# Patient Record
Sex: Female | Born: 1945
Health system: Southern US, Community
[De-identification: ages and names within clinical notes are randomized; demographics above are authoritative.]

## PROBLEM LIST (undated history)

## (undated) DIAGNOSIS — K219 Gastro-esophageal reflux disease without esophagitis: Secondary | ICD-10-CM

## (undated) DIAGNOSIS — G43909 Migraine, unspecified, not intractable, without status migrainosus: Secondary | ICD-10-CM

## (undated) DIAGNOSIS — I10 Essential (primary) hypertension: Secondary | ICD-10-CM

## (undated) DIAGNOSIS — E119 Type 2 diabetes mellitus without complications: Secondary | ICD-10-CM

## (undated) DIAGNOSIS — M1990 Unspecified osteoarthritis, unspecified site: Secondary | ICD-10-CM

## (undated) DIAGNOSIS — M810 Age-related osteoporosis without current pathological fracture: Secondary | ICD-10-CM

## (undated) DIAGNOSIS — I499 Cardiac arrhythmia, unspecified: Secondary | ICD-10-CM

## (undated) DIAGNOSIS — M549 Dorsalgia, unspecified: Secondary | ICD-10-CM

## (undated) DIAGNOSIS — Z8379 Family history of other diseases of the digestive system: Secondary | ICD-10-CM

## (undated) DIAGNOSIS — D179 Benign lipomatous neoplasm, unspecified: Secondary | ICD-10-CM

## (undated) DIAGNOSIS — R519 Headache, unspecified: Secondary | ICD-10-CM

## (undated) DIAGNOSIS — K3580 Unspecified acute appendicitis: Secondary | ICD-10-CM

## (undated) DIAGNOSIS — K5790 Diverticulosis of intestine, part unspecified, without perforation or abscess without bleeding: Secondary | ICD-10-CM

## (undated) DIAGNOSIS — R51 Headache: Secondary | ICD-10-CM

## (undated) HISTORY — DX: Age-related osteoporosis without current pathological fracture: M81.0

## (undated) HISTORY — DX: Family history of other diseases of the digestive system: Z83.79

## (undated) HISTORY — DX: Headache: R51

## (undated) HISTORY — DX: Diverticulosis of intestine, part unspecified, without perforation or abscess without bleeding: K57.90

## (undated) HISTORY — DX: Cardiac arrhythmia, unspecified: I49.9

## (undated) HISTORY — DX: Headache, unspecified: R51.9

## (undated) HISTORY — DX: Unspecified osteoarthritis, unspecified site: M19.90

## (undated) HISTORY — DX: Benign lipomatous neoplasm, unspecified: D17.9

## (undated) HISTORY — DX: Type 2 diabetes mellitus without complications: E11.9

## (undated) HISTORY — DX: Gastro-esophageal reflux disease without esophagitis: K21.9

## (undated) HISTORY — PX: CHOLECYSTECTOMY: SHX55

## (undated) HISTORY — DX: Essential (primary) hypertension: I10

## (undated) HISTORY — DX: Dorsalgia, unspecified: M54.9

## (undated) HISTORY — PX: TONSILLECTOMY: SUR1361

## (undated) MED FILL — Fosaprepitant Dimeglumine For IV Infusion 150 MG (Base Eq): INTRAVENOUS | Qty: 5 | Status: AC

---

## 2003-07-17 ENCOUNTER — Emergency Department (HOSPITAL_COMMUNITY): Admission: EM | Admit: 2003-07-17 | Discharge: 2003-07-17 | Payer: Self-pay | Admitting: Emergency Medicine

## 2003-07-18 ENCOUNTER — Emergency Department (HOSPITAL_COMMUNITY): Admission: EM | Admit: 2003-07-18 | Discharge: 2003-07-18 | Payer: Self-pay | Admitting: Emergency Medicine

## 2003-07-18 IMAGING — CR DG CHEST 2V
2 series · 2 of 2 positions shown · non-contrast
Comparison: none

CLINICAL DATA: Chest pain; ER pt
 CHEST TWO VIEWS 
 No priors for comparison.  Mild cardiomegaly.  Peripheral pulmonary vessels have a caliber which I would consider to be slightly prominent.  Mild chronic peribronchial changes are likely present.  No evidence of pulmonary edema or infiltrate.  Thoracic scoliosis convex left.
 IMPRESSION
 Cardiomegaly.  Question pulmonary venous hypertension.

[view not recorded (1 of 2)]
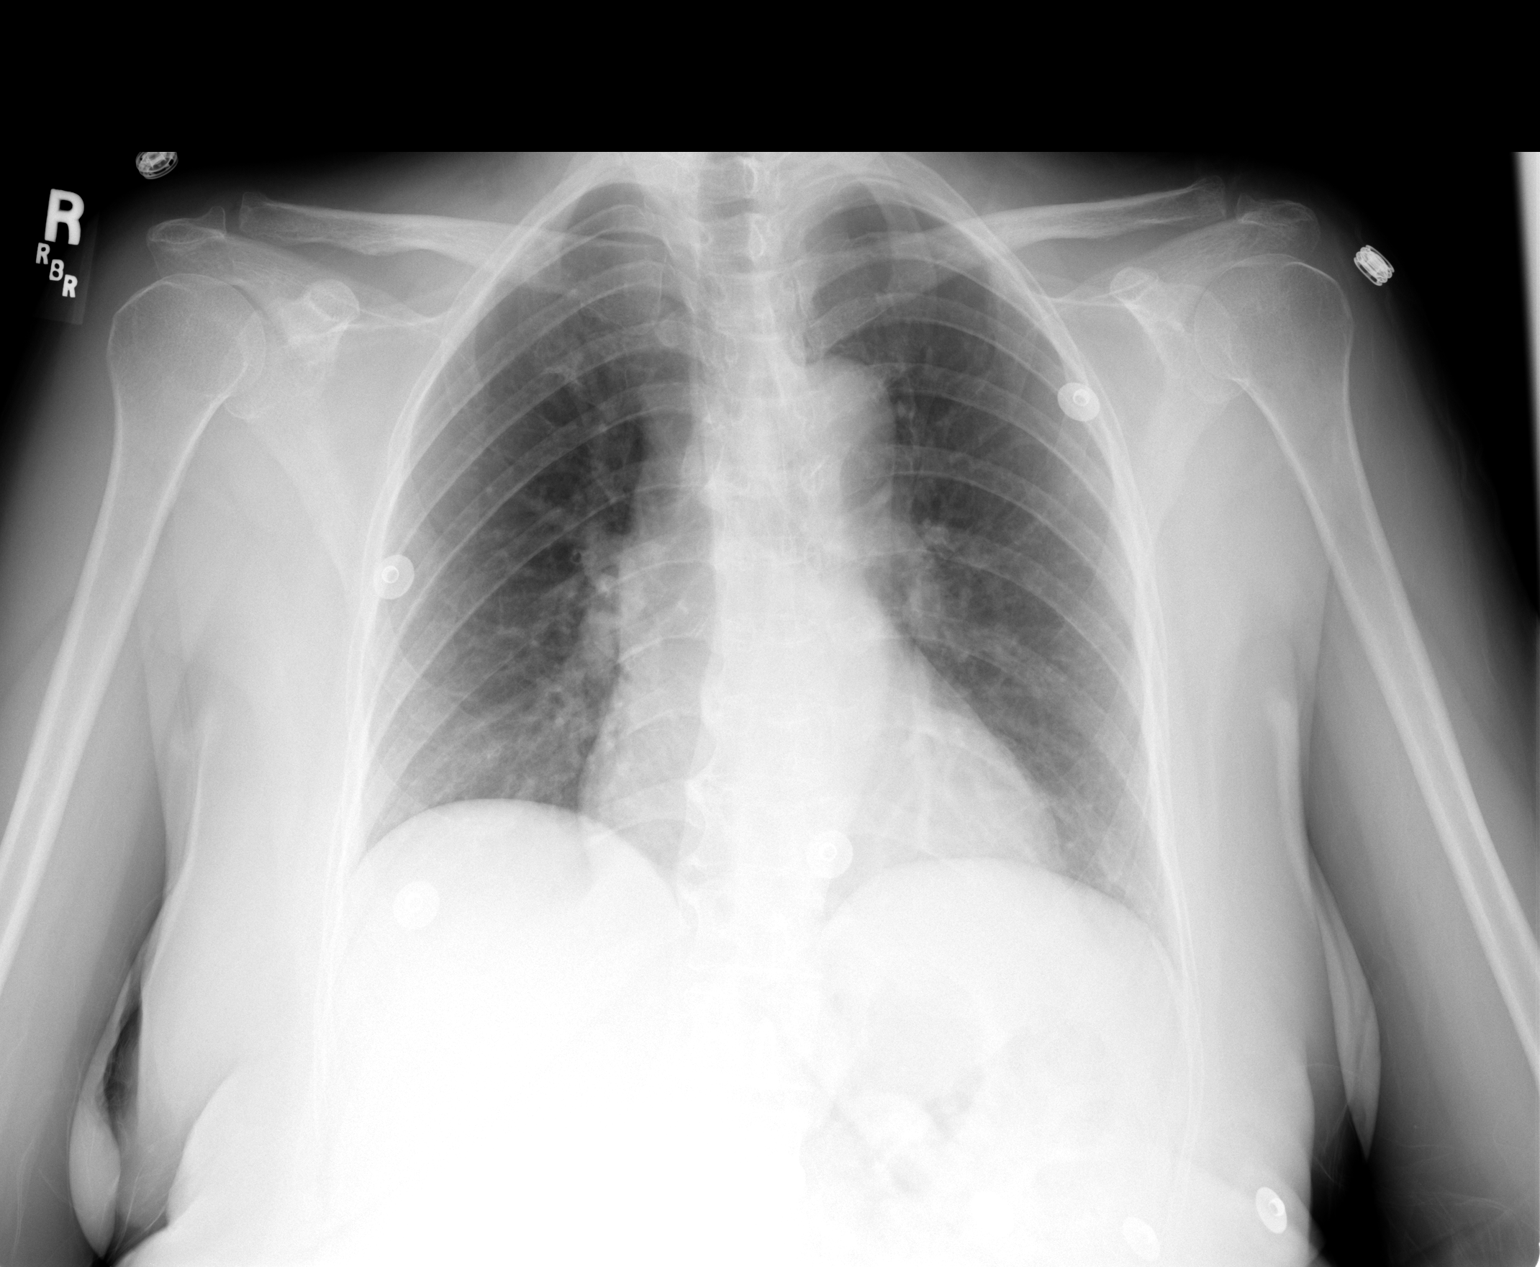

[view not recorded (2 of 2)]
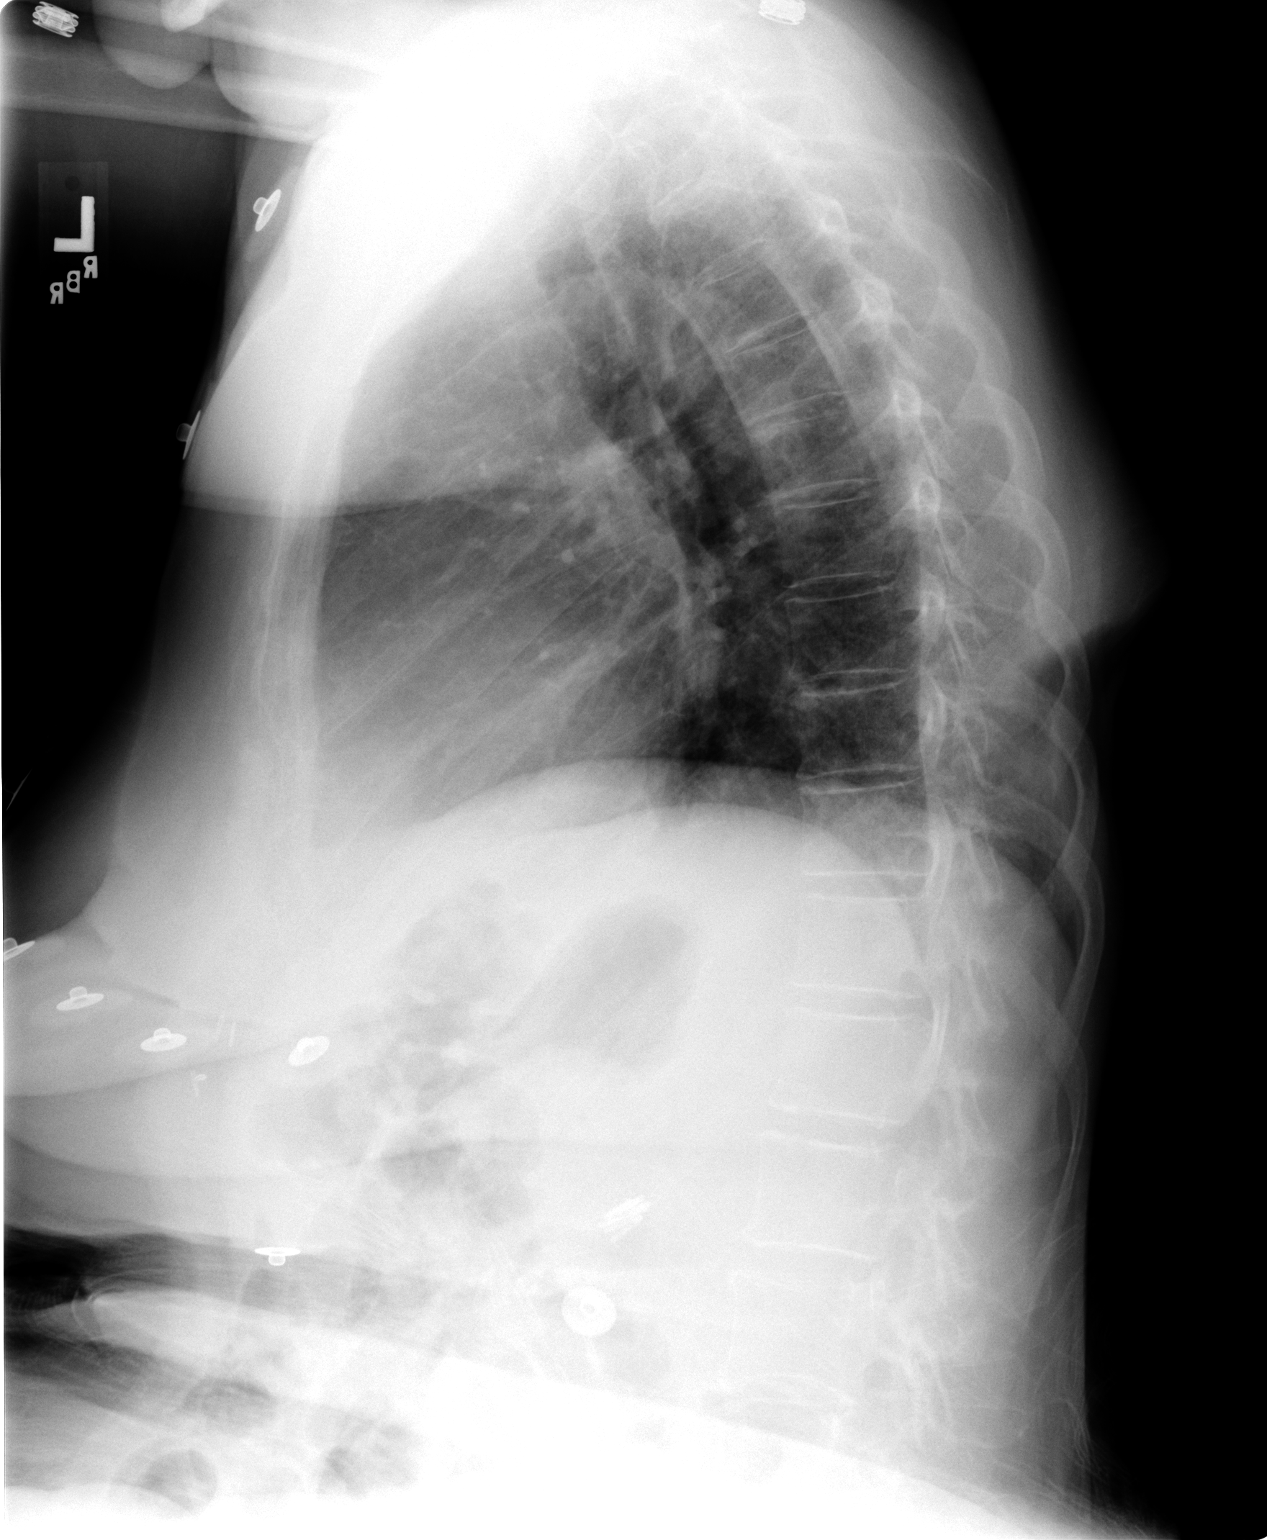

[2 of 2 positions shown; findings below may reference images not displayed]

## 2003-07-18 IMAGING — CT CT HEAD W/O CM
1 of 2 series · 13 of 30 positions shown, 17 images · non-contrast
Comparison: none

CLINICAL DATA: Headache, facial numbness, vertigo.  Hypertension.
 CT HEAD WITHOUT CONTRAST, [DATE]
TECHNIQUE: Multidetector helical CT scanning obtained from the skull base to the vertex.

[Series 2: brain · axial · 0.47mm/px · z∈[+151,+273]mm · 13 of 28 slices shown, 17 images]
[im 2/28  brain]
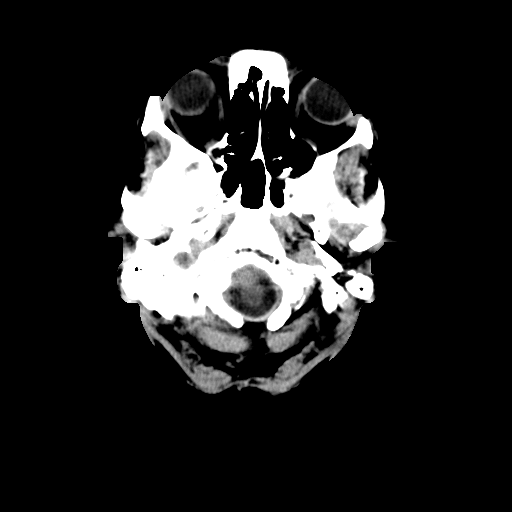
[im 2/28  bone]
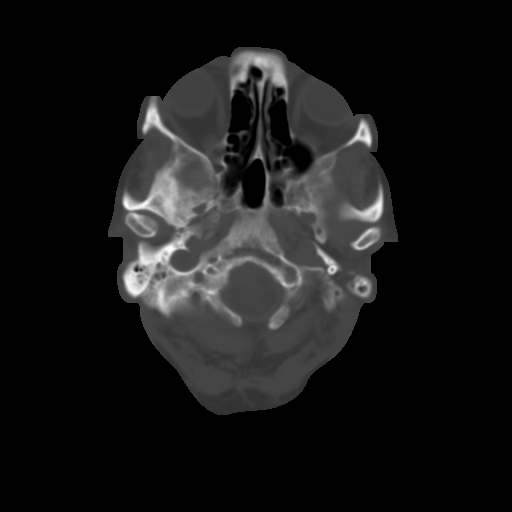
[im 4/28  brain]
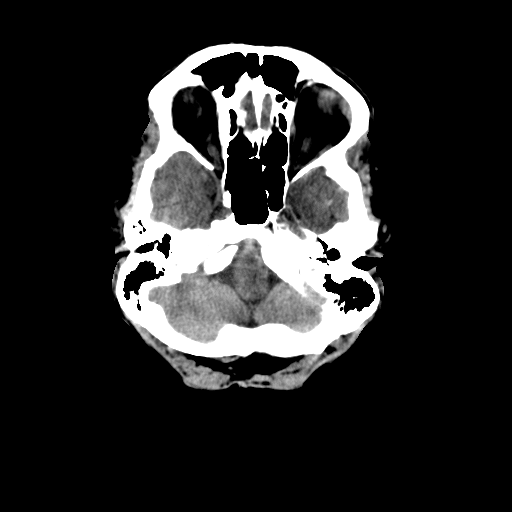
[im 6/28  brain]
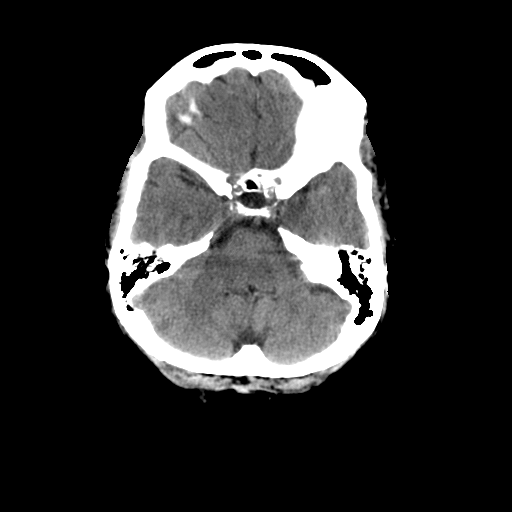
[im 8/28  brain]
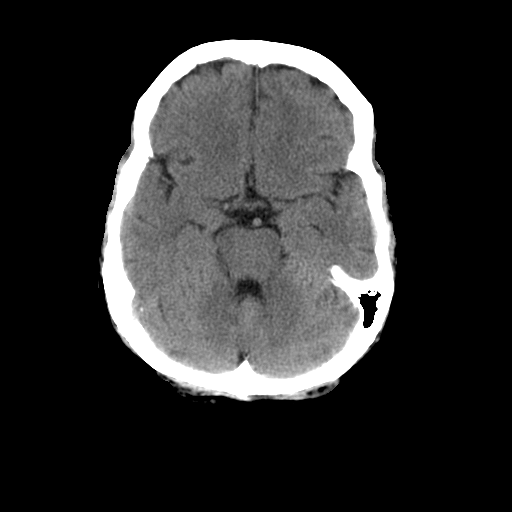
[im 10/28  brain]
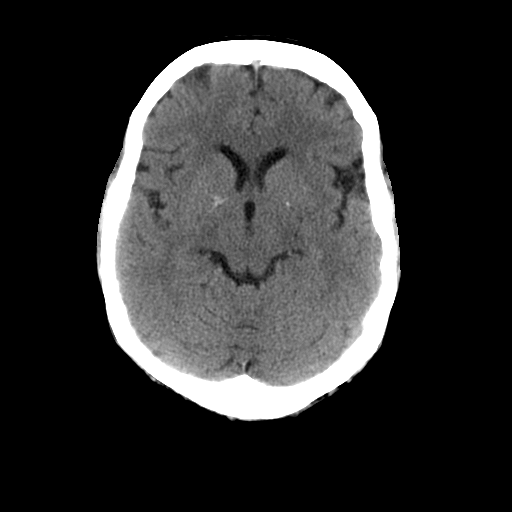
[im 10/28  bone]
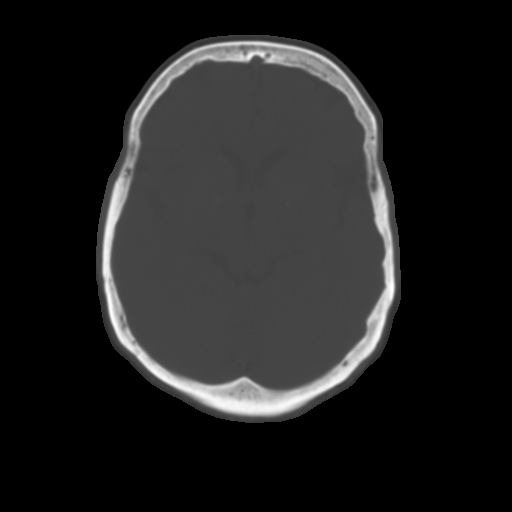
[im 12/28  brain]
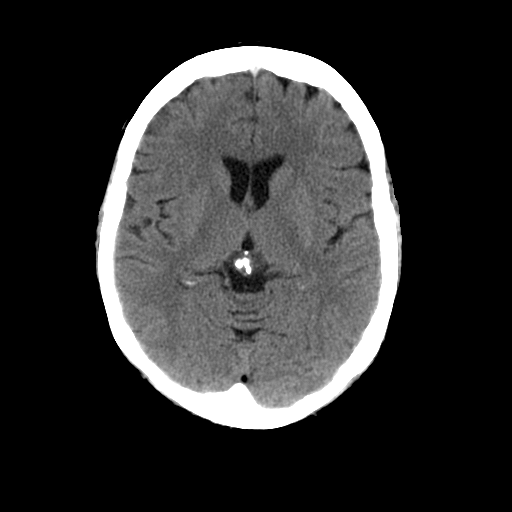
[im 14/28  brain]
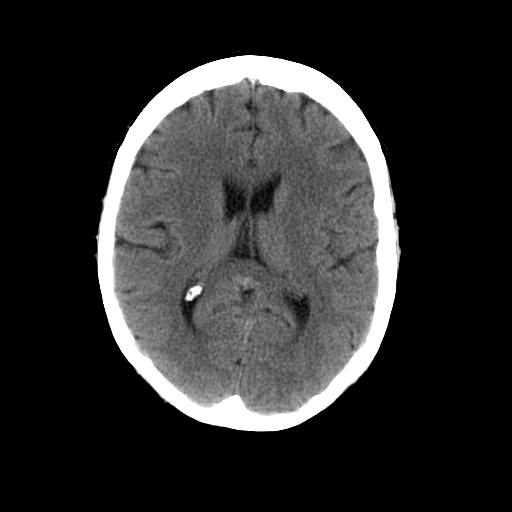
[im 16/28  brain]
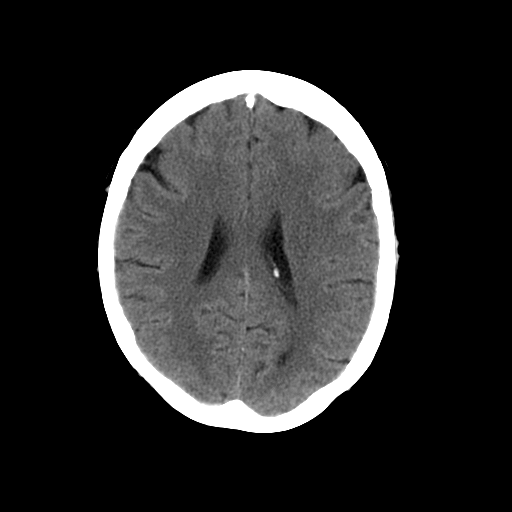
[im 18/28  brain]
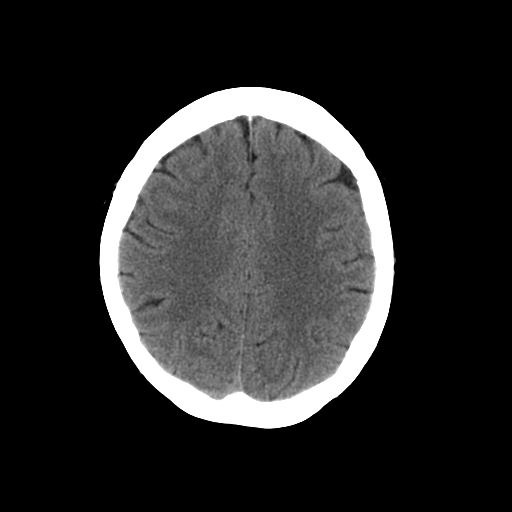
[im 18/28  bone]
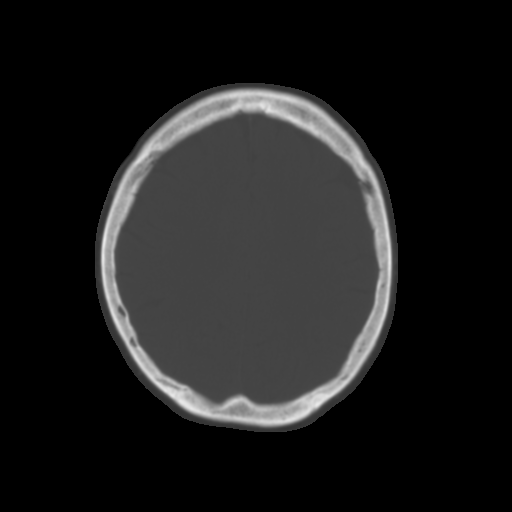
[im 20/28  brain]
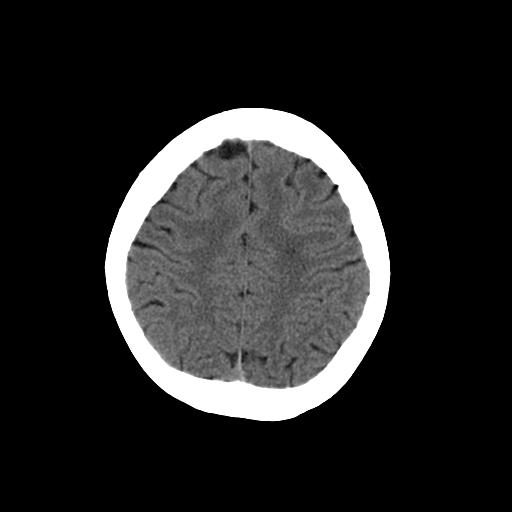
[im 22/28  brain]
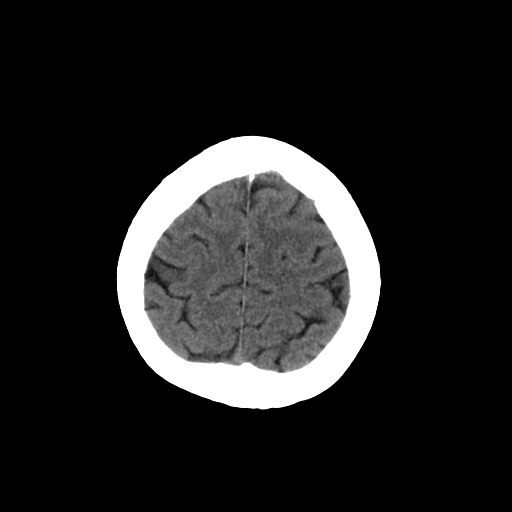
[im 24/28  brain]
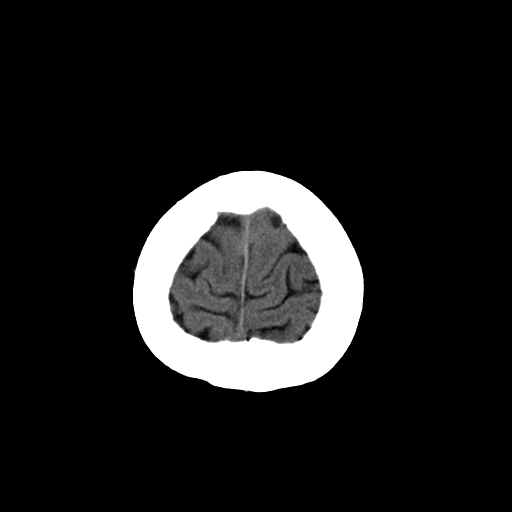
[im 26/28  brain]
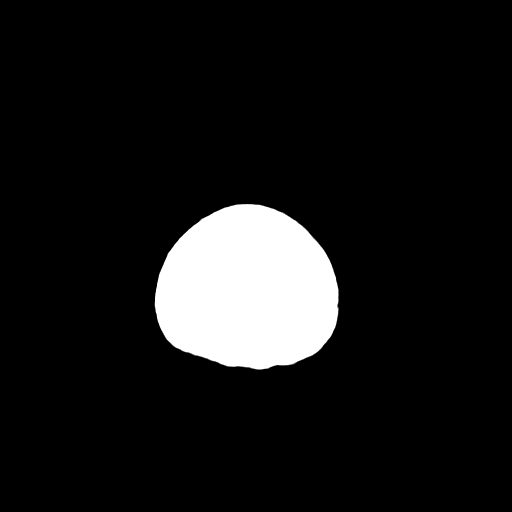
[im 26/28  bone]
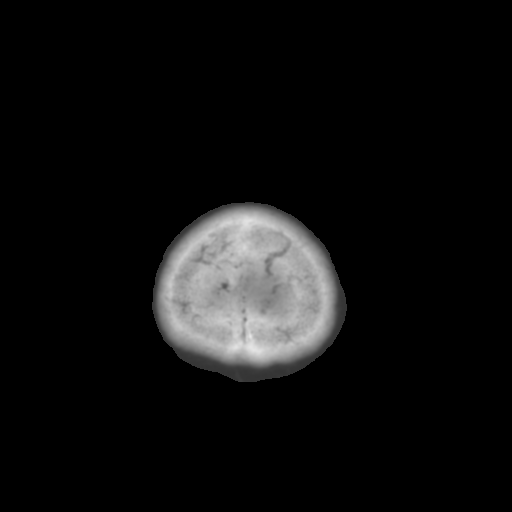

[13 of 30 positions shown; findings below may reference images not displayed]

FINDINGS: No comparison films available.  
 No acute intracranial abnormality including mass or mass effect, hydrocephalus, extra-axial fluid collection, midline shift, hemorrhage, infarct.  Acute infarct may be missed by CT for 24 to 48 hours.  Visualized bony calvarium and paranasal sinuses unremarkable.
 IMPRESSION 
 No evidence of acute intracranial abnormality.

## 2003-08-23 ENCOUNTER — Emergency Department (HOSPITAL_COMMUNITY): Admission: EM | Admit: 2003-08-23 | Discharge: 2003-08-23 | Payer: Self-pay | Admitting: Emergency Medicine

## 2003-11-08 ENCOUNTER — Emergency Department (HOSPITAL_COMMUNITY): Admission: EM | Admit: 2003-11-08 | Discharge: 2003-11-08 | Payer: Self-pay | Admitting: Emergency Medicine

## 2006-05-22 ENCOUNTER — Encounter: Admission: RE | Admit: 2006-05-22 | Discharge: 2006-05-22 | Payer: Self-pay | Admitting: Family Medicine

## 2008-08-04 ENCOUNTER — Emergency Department (HOSPITAL_COMMUNITY): Admission: EM | Admit: 2008-08-04 | Discharge: 2008-08-04 | Payer: Self-pay | Admitting: Emergency Medicine

## 2009-09-23 ENCOUNTER — Encounter: Admission: RE | Admit: 2009-09-23 | Discharge: 2009-09-23 | Payer: Self-pay | Admitting: Specialist

## 2009-09-23 IMAGING — CR DG HAND COMPLETE 3+V*R*
3 series · 3 of 3 positions shown · non-contrast
Comparison: None.

CLINICAL DATA: Injured lifting heavy object 3 weeks ago

RIGHT HAND - COMPLETE 3+ VIEW

[view not recorded (1 of 3)]
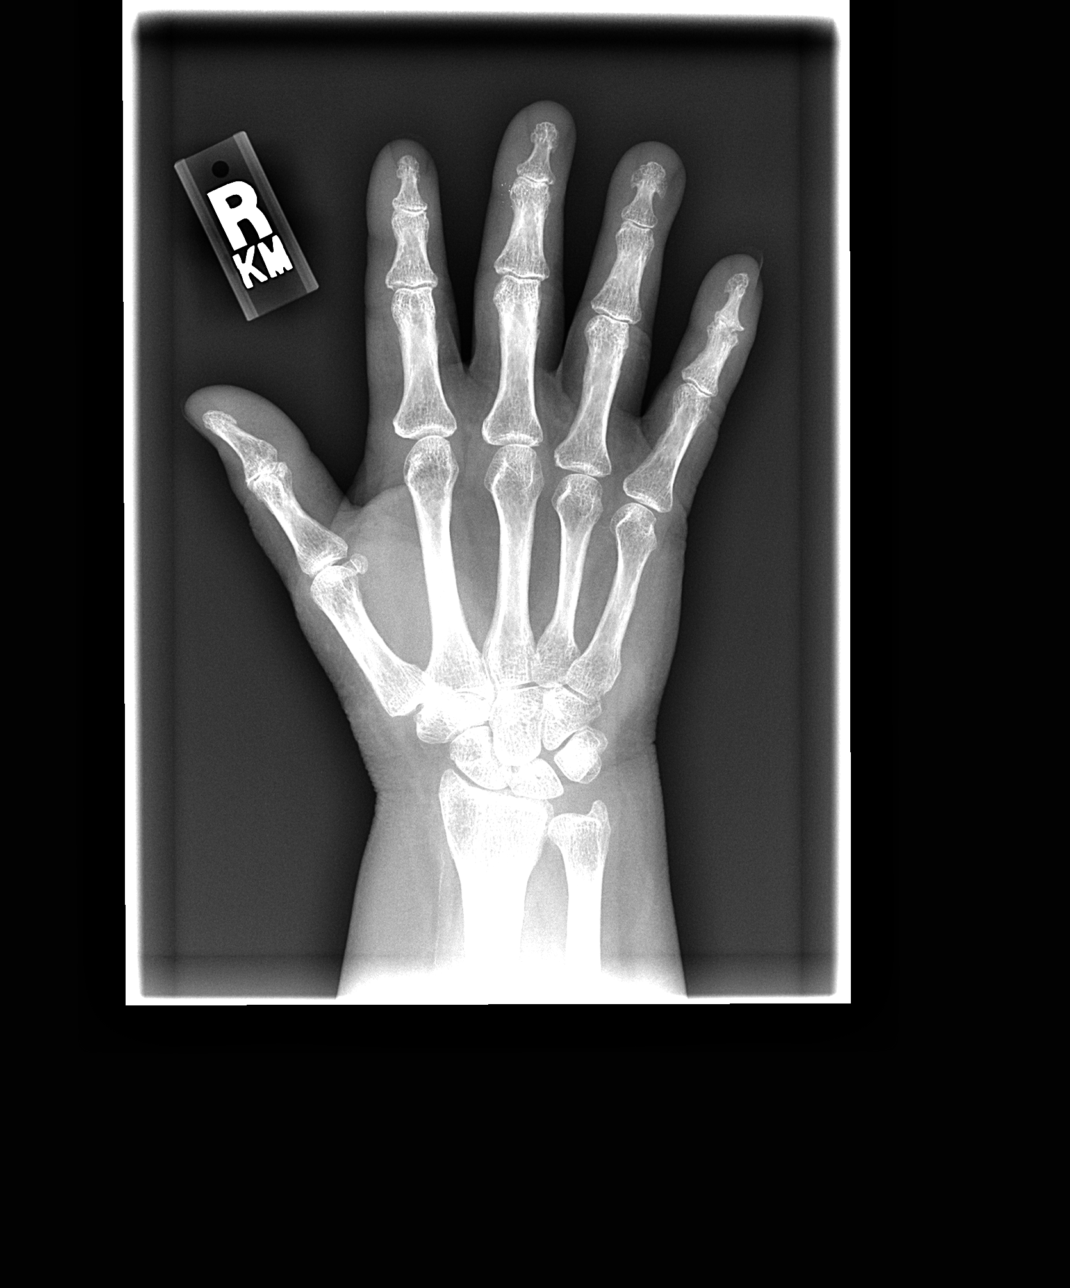

[view not recorded (2 of 3)]
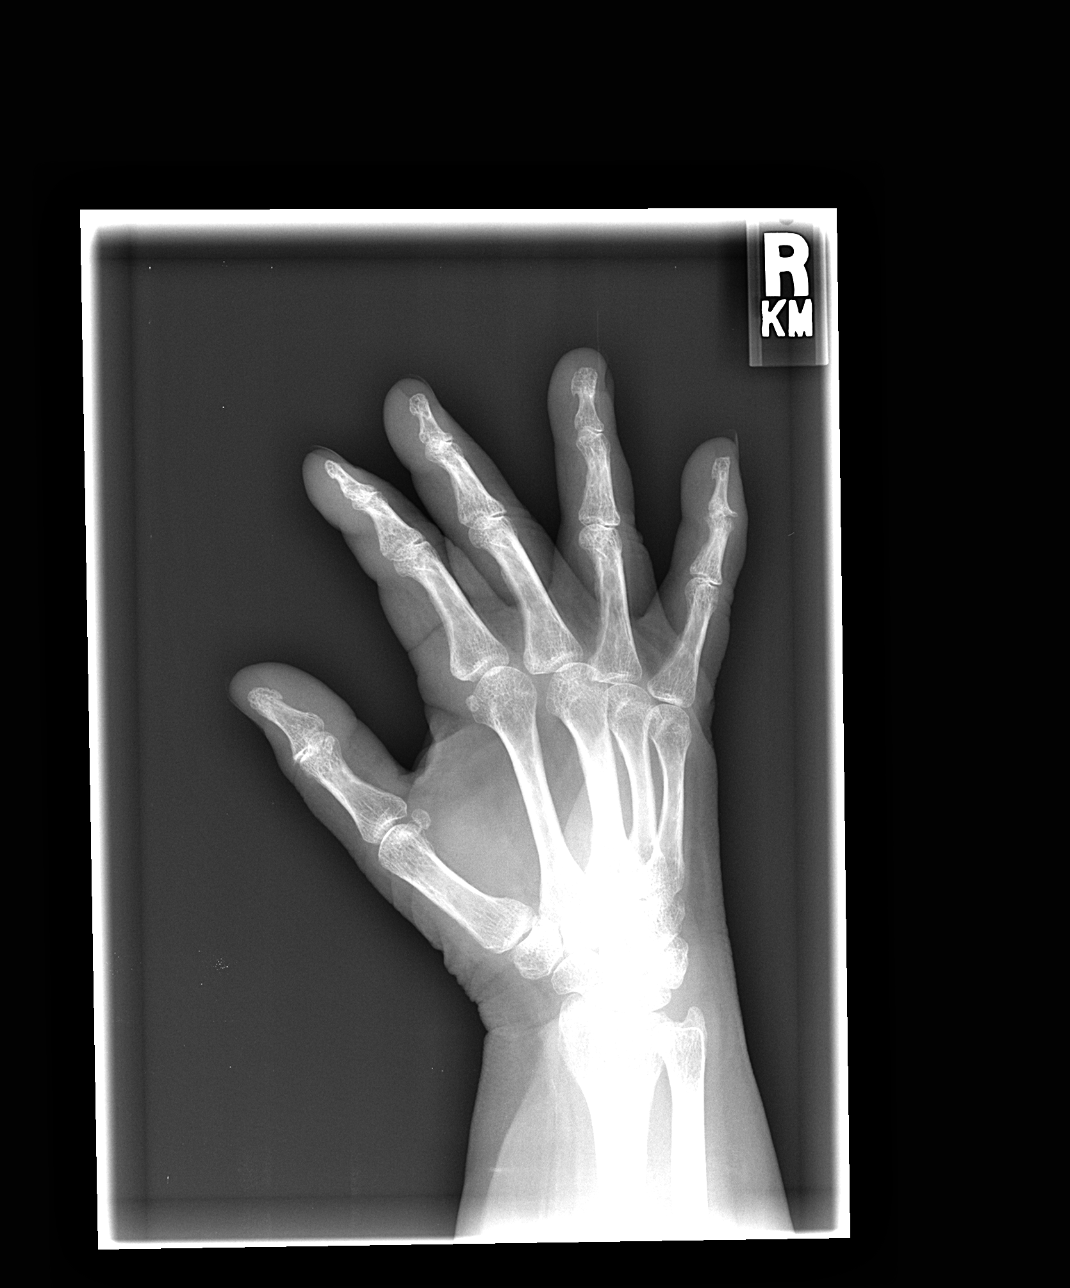

[view not recorded (3 of 3)]
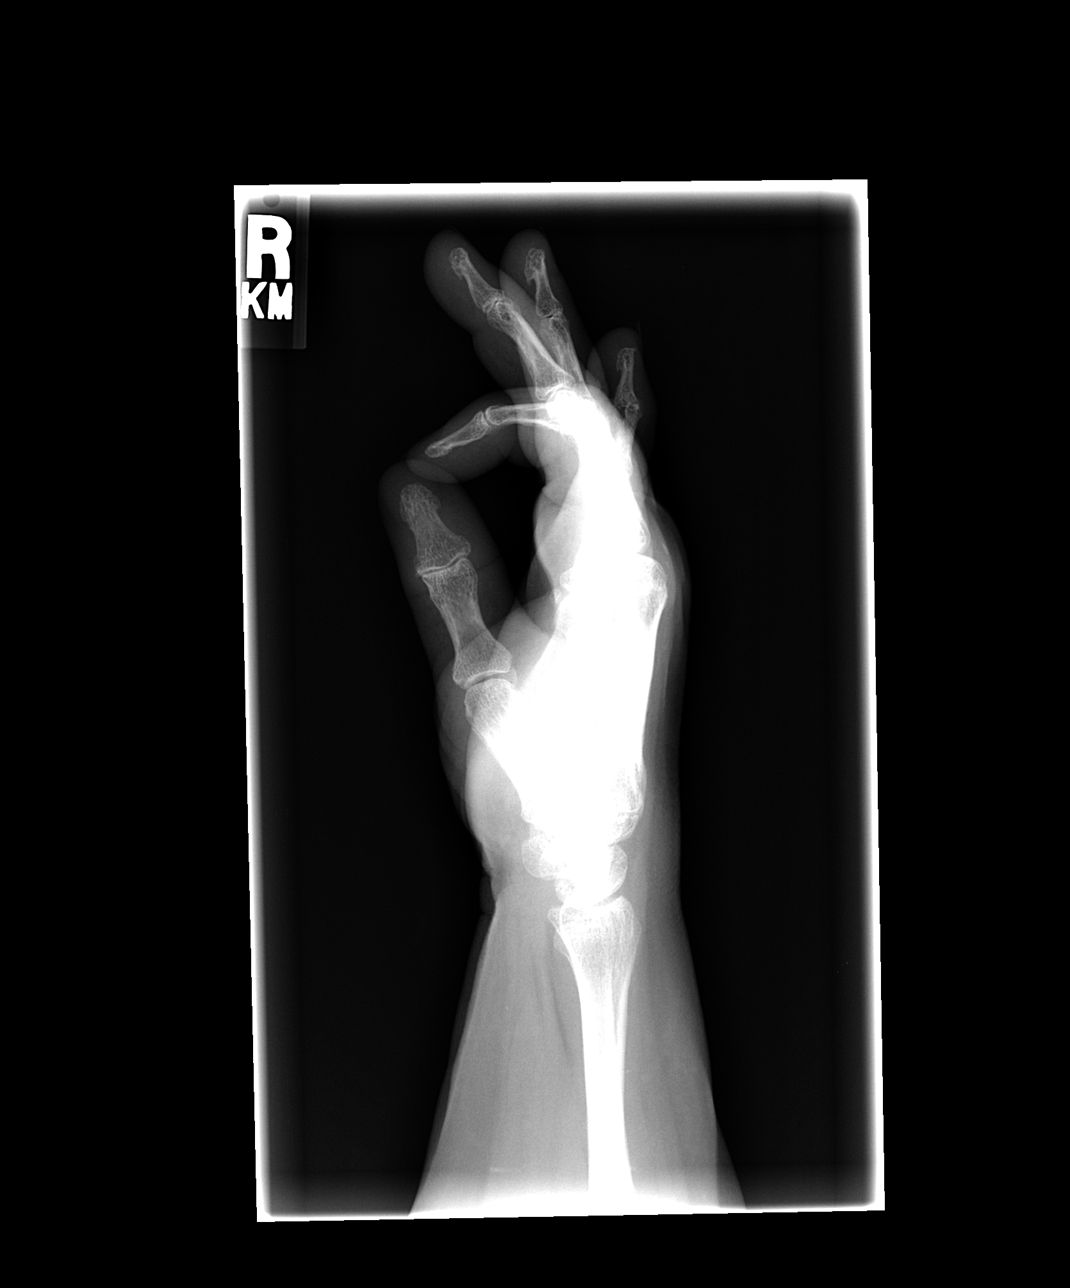

[3 of 3 positions shown; findings below may reference images not displayed]

FINDINGS: No acute fracture is seen.  The radiocarpal joint space
appears normal and the ulnar styloid is intact . The carpal bones
are in normal position.  MCP and PIP joints appear normal.  However
there is degenerative change diffusely throughout the DIP joints
particularly involving the right fifth DIP joint.  No erosion is
seen.
IMPRESSION: Degenerative change involves the DIP joints particularly the right
fifth DIP joint.  No acute abnormality.

## 2009-09-23 IMAGING — CR DG FOREARM 2V*R*
2 series · 2 of 2 positions shown · non-contrast
Comparison: None.

CLINICAL DATA: Injured lifting heavy object 3 weeks ago

RIGHT FOREARM - 2 VIEW

[view not recorded (1 of 2)]
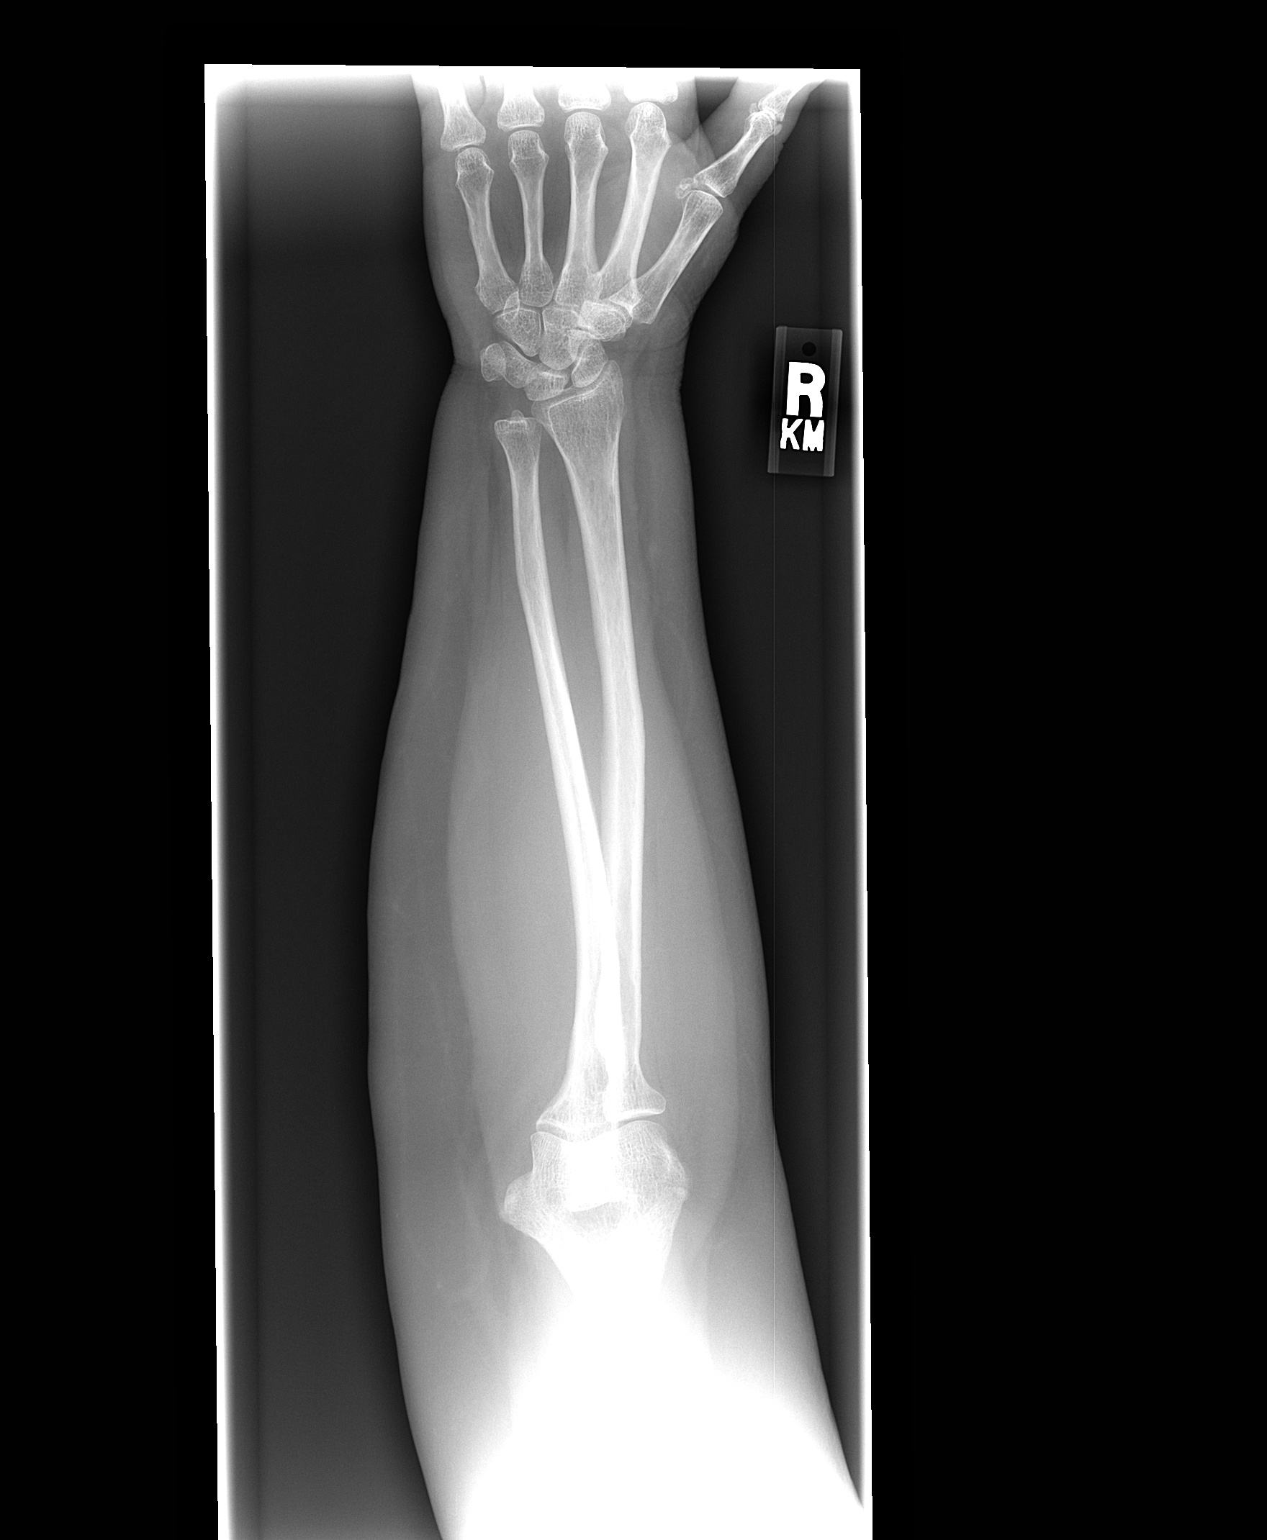

[view not recorded (2 of 2)]
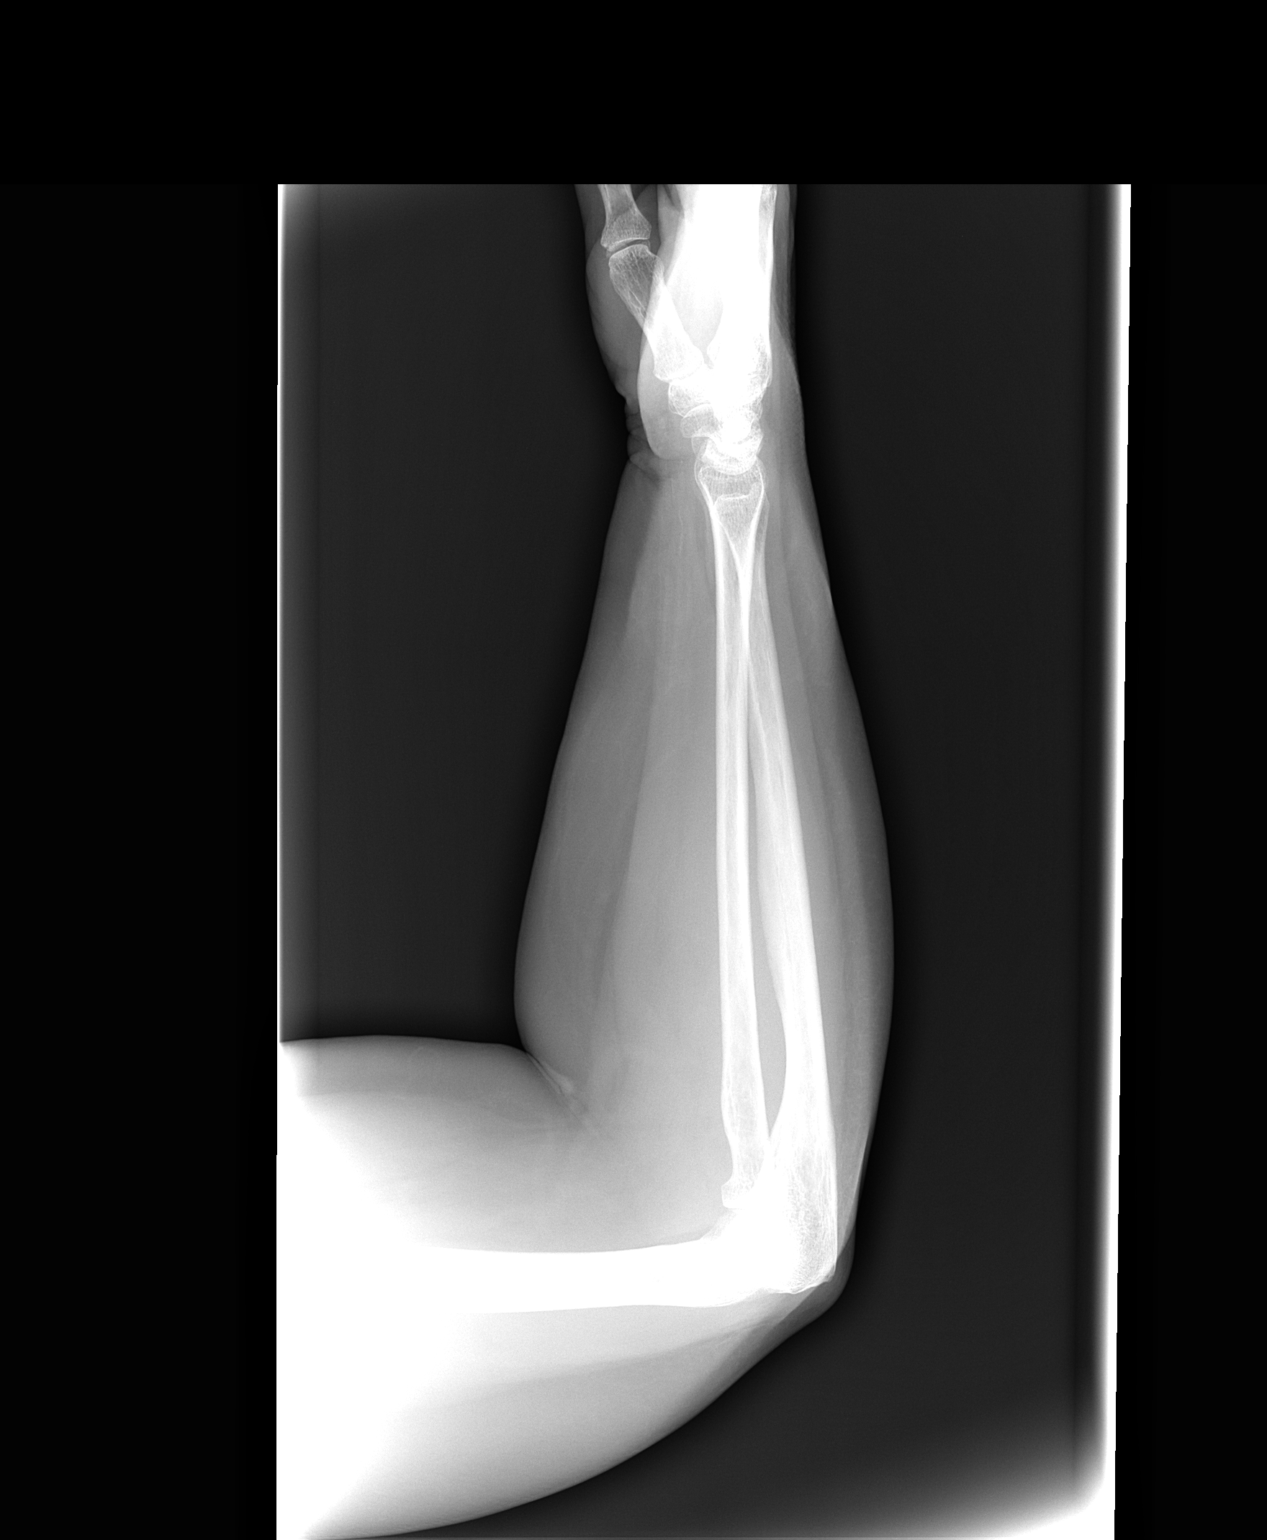

[2 of 2 positions shown; findings below may reference images not displayed]

FINDINGS: The right radius and ulna are intact and normally
aligned.  No acute abnormality is seen.
IMPRESSION: Negative right forearm.

## 2010-04-17 ENCOUNTER — Encounter: Payer: Self-pay | Admitting: Family Medicine

## 2010-04-17 ENCOUNTER — Encounter: Payer: Self-pay | Admitting: Cardiology

## 2010-04-17 ENCOUNTER — Encounter: Payer: Self-pay | Admitting: Internal Medicine

## 2010-07-05 LAB — POCT I-STAT, CHEM 8
BUN: 25 mg/dL — ABNORMAL HIGH (ref 6–23)
Calcium, Ion: 1.17 mmol/L (ref 1.12–1.32)
Chloride: 107 mEq/L (ref 96–112)
Creatinine, Ser: 0.8 mg/dL (ref 0.4–1.2)
HCT: 35 % — ABNORMAL LOW (ref 36.0–46.0)

## 2011-09-02 ENCOUNTER — Encounter: Payer: Self-pay | Admitting: *Deleted

## 2012-01-16 ENCOUNTER — Other Ambulatory Visit: Payer: Self-pay | Admitting: Specialist

## 2012-01-16 DIAGNOSIS — M899 Disorder of bone, unspecified: Secondary | ICD-10-CM

## 2012-01-18 ENCOUNTER — Ambulatory Visit
Admission: RE | Admit: 2012-01-18 | Discharge: 2012-01-18 | Disposition: A | Discharge status: Home / Self Care | Payer: Medicare Other | Source: Ambulatory Visit | Attending: Specialist | Admitting: Specialist

## 2012-01-18 DIAGNOSIS — M899 Disorder of bone, unspecified: Secondary | ICD-10-CM

## 2012-05-22 ENCOUNTER — Encounter: Payer: Self-pay | Admitting: Cardiology

## 2012-11-15 ENCOUNTER — Observation Stay (HOSPITAL_COMMUNITY)
Admission: EM | Admit: 2012-11-15 | Discharge: 2012-11-17 | Disposition: A | Discharge status: Home / Self Care | Payer: Medicare Other | Attending: General Surgery | Admitting: General Surgery

## 2012-11-15 ENCOUNTER — Encounter (HOSPITAL_COMMUNITY): Payer: Self-pay | Admitting: Emergency Medicine

## 2012-11-15 DIAGNOSIS — I10 Essential (primary) hypertension: Secondary | ICD-10-CM | POA: Diagnosis present

## 2012-11-15 DIAGNOSIS — K3580 Unspecified acute appendicitis: Principal | ICD-10-CM | POA: Diagnosis present

## 2012-11-15 DIAGNOSIS — K219 Gastro-esophageal reflux disease without esophagitis: Secondary | ICD-10-CM | POA: Diagnosis present

## 2012-11-15 DIAGNOSIS — Z79899 Other long term (current) drug therapy: Secondary | ICD-10-CM | POA: Insufficient documentation

## 2012-11-15 HISTORY — DX: Unspecified acute appendicitis: K35.80

## 2012-11-15 HISTORY — DX: Gastro-esophageal reflux disease without esophagitis: K21.9

## 2012-11-15 HISTORY — DX: Essential (primary) hypertension: I10

## 2012-11-15 LAB — CBC WITH DIFFERENTIAL/PLATELET
Basophils Absolute: 0 10*3/uL (ref 0.0–0.1)
Eosinophils Relative: 0 % (ref 0–5)
HCT: 35.1 % — ABNORMAL LOW (ref 36.0–46.0)
Hemoglobin: 12.3 g/dL (ref 12.0–15.0)
MCH: 30.5 pg (ref 26.0–34.0)
MCHC: 35 g/dL (ref 30.0–36.0)
Monocytes Absolute: 0.8 10*3/uL (ref 0.1–1.0)
RBC: 4.03 MIL/uL (ref 3.87–5.11)
RDW: 12.6 % (ref 11.5–15.5)

## 2012-11-15 LAB — COMPREHENSIVE METABOLIC PANEL
AST: 19 U/L (ref 0–37)
Alkaline Phosphatase: 105 U/L (ref 39–117)
GFR calc Af Amer: 83 mL/min — ABNORMAL LOW (ref >90)
GFR calc non Af Amer: 71 mL/min — ABNORMAL LOW (ref >90)
Sodium: 139 mEq/L (ref 135–145)
Total Bilirubin: 0.3 mg/dL (ref 0.3–1.2)
Total Protein: 7.1 g/dL (ref 6.0–8.3)

## 2012-11-15 LAB — LIPASE, BLOOD: Lipase: 15 U/L (ref 11–59)

## 2012-11-15 LAB — POCT I-STAT TROPONIN I: Troponin i, poc: 0 ng/mL (ref 0.00–0.08)

## 2012-11-15 LAB — URINALYSIS, ROUTINE W REFLEX MICROSCOPIC
Hgb urine dipstick: NEGATIVE
Nitrite: NEGATIVE
Protein, ur: NEGATIVE mg/dL
Urobilinogen, UA: 0.2 mg/dL (ref 0.0–1.0)

## 2012-11-15 LAB — URINE MICROSCOPIC-ADD ON

## 2012-11-15 NOTE — ED Provider Notes (Signed)
CSN: [id number]     Arrival date & time 11/15/12  2027 History     First MD Initiated Contact with Patient 11/15/12 2106     Chief Complaint  Patient presents with  . Abdominal Pain   (Consider location/radiation/quality/duration/timing/severity/associated sxs/prior Treatment) HPI Comments: Patient is a 67 year old female with a history of cholecystectomy, cesarean section, hypertension, and GERD who presents for abdominal pain with onset this morning. Patient states the pain has been constant in nature without any aggravating or alleviating factors. Pain is nonradiating and is present from her LUQ and L mid abdomen, supraumbilically, and in her RUQ and R mid abdomen. Patient admits to associated increasing abdominal distention. Patient denies associated fever, chest pain, shortness of breath, nausea or vomiting, diarrhea, melena or hematochezia, back pain, dysuria, hematuria or urinary frequency, numbness or tingling, and weakness. Denies ever having had a colonoscopy. States her last bowel movement was this morning which was normal in color and consistency.  Patient is a 67 y.o. female presenting with abdominal pain. The history is provided by the patient. No language interpreter was used.  Abdominal Pain Associated symptoms: no chest pain, no diarrhea, no dysuria, no fever, no hematuria, no nausea, no shortness of breath and no vomiting     Past Medical History  Diagnosis Date  . HTN (hypertension)   . FH: cholecystectomy   . Back pain   . Osteoporosis   . Gastroesophageal reflux disease    Past Surgical History  Procedure Laterality Date  . Cesarean section    . Cholecystectomy     Family History  Problem Relation Age of Onset  . Cancer Father 43    deceased  . Stroke Mother 68    deceased  . Hypertension Sister    History  Substance Use Topics  . Smoking status: Never Smoker   . Smokeless tobacco: Not on file  . Alcohol Use: No   OB History   Grav Para Term Preterm  Abortions TAB SAB Ect Mult Living                 Review of Systems  Constitutional: Negative for fever.  Respiratory: Negative for shortness of breath.   Cardiovascular: Negative for chest pain.  Gastrointestinal: Positive for abdominal pain and abdominal distention. Negative for nausea, vomiting, diarrhea and blood in stool.  Genitourinary: Negative for dysuria and hematuria.  Musculoskeletal: Negative for back pain.  Neurological: Negative for weakness and numbness.  All other systems reviewed and are negative.    Allergies  Penicillins and Iodine  Home Medications   Current Outpatient Rx  Name  Route  Sig  Dispense  Refill  . diclofenac (VOLTAREN) 25 MG EC tablet   Oral   Take 25 mg by mouth daily.         Marland Kitchen diltiazem (TIAZAC) 120 MG 24 hr capsule   Oral   Take 120 mg by mouth daily.         . magnesium hydroxide (MILK OF MAGNESIA) 800 MG/5ML suspension   Oral   Take 5 mLs by mouth daily as needed for constipation.         . nitroGLYCERIN (NITROSTAT) 0.4 MG SL tablet   Sublingual   Place 0.4 mg under the tongue every 5 (five) minutes as needed.         Marland Kitchen olmesartan (BENICAR) 20 MG tablet   Oral   Take 20 mg by mouth daily.         Marland Kitchen OVER THE  COUNTER MEDICATION   Oral   Take 30 mLs by mouth daily as needed (for heartburn).         . RABEprazole Sodium (ACIPHEX PO)   Oral   Take 20 mg by mouth daily.         Marland Kitchen tiZANidine (ZANAFLEX) 4 MG tablet   Oral   Take 4 mg by mouth every 6 (six) hours as needed (for muscle cramps).          BP 107/67  Pulse 94  Temp(Src) 98.2 F (36.8 C) (Oral)  SpO2 97%  Physical Exam  Nursing note and vitals reviewed. Constitutional: She is oriented to person, place, and time. She appears well-developed and well-nourished. No distress.  HENT:  Head: Normocephalic and atraumatic.  Mouth/Throat: Oropharynx is clear and moist. No oropharyngeal exudate.  Eyes: Conjunctivae and EOM are normal. Pupils are equal,  round, and reactive to light. No scleral icterus.  Neck: Normal range of motion.  Cardiovascular: Normal rate, regular rhythm and normal heart sounds.   Pulmonary/Chest: Effort normal and breath sounds normal. No respiratory distress. She has no wheezes. She has no rales.  Abdominal: Soft. Bowel sounds are normal. She exhibits distension. She exhibits no pulsatile midline mass and no mass. There is tenderness in the right upper quadrant, periumbilical area, suprapubic area and left upper quadrant. There is no rebound, no guarding and no CVA tenderness.    Musculoskeletal: Normal range of motion.  Neurological: She is alert and oriented to person, place, and time.  Skin: Skin is warm and dry. No rash noted. She is not diaphoretic. No erythema. No pallor.  Psychiatric: She has a normal mood and affect. Her behavior is normal.    ED Course   Procedures (including critical care time)  Labs Reviewed  CBC WITH DIFFERENTIAL - Abnormal; Notable for the following:    WBC 12.7 (*)    HCT 35.1 (*)    Neutrophils Relative % 87 (*)    Neutro Abs 11.0 (*)    Lymphocytes Relative 7 (*)    All other components within normal limits  COMPREHENSIVE METABOLIC PANEL - Abnormal; Notable for the following:    Glucose, Bld 170 (*)    GFR calc non Af Amer 71 (*)    GFR calc Af Amer 83 (*)    All other components within normal limits  URINALYSIS, ROUTINE W REFLEX MICROSCOPIC - Abnormal; Notable for the following:    APPearance CLOUDY (*)    Leukocytes, UA SMALL (*)    All other components within normal limits  URINE MICROSCOPIC-ADD ON - Abnormal; Notable for the following:    Bacteria, UA MANY (*)    All other components within normal limits  URINE CULTURE  LIPASE, BLOOD  POCT I-STAT TROPONIN I   Ct Abdomen Pelvis Wo Contrast  11/16/2012   CLINICAL DATA:  Diffuse abdominal pain.  EXAM: CT ABDOMEN AND PELVIS WITHOUT CONTRAST  TECHNIQUE: Multidetector CT imaging of the abdomen and pelvis was performed  following the standard protocol without intravenous contrast.  COMPARISON:  None.  FINDINGS: The examination is limited by the lack of intravenous contrast. The appendix is markedly enlarged and contains an appendicolith. The appendix measures 12 mm in maximum diameter on image number 56. Periappendiceal soft tissue stranding is noted. Also noted are multiple mildly enlarged lymph nodes in the right lower abdominal mesentery.  No fluid collections or free peritoneal air. Unremarkable uterus, ovaries, urinary bladder, kidneys, liver, spleen and pancreas. Normal appearing adrenal glands. Cholecystectomy  clips. Multiple colonic diverticula without evidence of diverticulitis. Minimal bibasilar lung atelectasis or scarring. Right L5 pars interarticularis defect with minimal anterolisthesis at the L5-S1 level. The left L5 pars is intact.  IMPRESSION: 1. Acute appendicitis without abscess. 2. Mild reactive mesenteric adenopathy in the right lower abdomen. 3. Right L5 spondylolysis with minimal grade 1 spondylolisthesis at the L5-S1 level. These results were called by telephone on 11/16/2012 at 0104 hours to Antony Madura, P.A., who verbally acknowledged these results.   Electronically Signed   By: Gordan Payment   On: 11/16/2012 01:04   1. Acute appendicitis    MDM  67 year old female with a history of cholecystectomy, cesarean section, hypertension, and GERD who presents for abdominal pain with onset this morning. Denies fever, N/V; admits to normal bowel movement this AM. Patient afebrile and hemodynamically stable on arrival and throughout ED course. CT abdomen pelvis with PO contrast ordered to evaluate symptoms; significant for acute appendicitis without abscess. Labs with mild leukocytosis of 12.7. Consult to general surgery placed for admission. Patient NPO.  Dr. Donell Beers to admit. IV cipro and IV clinda ordered in light of PCN allergy.    Antony Madura, PA-C 11/16/12 0124

## 2012-11-15 NOTE — ED Notes (Addendum)
C/o generalized abd pain that started today.  Denies nausea, vomiting, and diarrhea. Very limited English.

## 2012-11-16 ENCOUNTER — Encounter (HOSPITAL_COMMUNITY)
Admission: EM | Disposition: A | Discharge status: Home / Self Care | Payer: Self-pay | Source: Home / Self Care | Attending: Emergency Medicine

## 2012-11-16 ENCOUNTER — Encounter (HOSPITAL_COMMUNITY): Payer: Self-pay | Admitting: *Deleted

## 2012-11-16 ENCOUNTER — Emergency Department (HOSPITAL_COMMUNITY): Payer: Medicare Other

## 2012-11-16 ENCOUNTER — Observation Stay (HOSPITAL_COMMUNITY): Payer: Medicare Other | Admitting: *Deleted

## 2012-11-16 DIAGNOSIS — K3580 Unspecified acute appendicitis: Secondary | ICD-10-CM

## 2012-11-16 HISTORY — PX: LAPAROSCOPIC APPENDECTOMY: SHX408

## 2012-11-16 HISTORY — PX: LAPAROSCOPIC LYSIS OF ADHESIONS: SHX5905

## 2012-11-16 LAB — SURGICAL PCR SCREEN
MRSA, PCR: NEGATIVE
Staphylococcus aureus: NEGATIVE

## 2012-11-16 LAB — BASIC METABOLIC PANEL
CO2: 23 mEq/L (ref 19–32)
Chloride: 105 mEq/L (ref 96–112)
Glucose, Bld: 162 mg/dL — ABNORMAL HIGH (ref 70–99)
Sodium: 137 mEq/L (ref 135–145)

## 2012-11-16 LAB — CBC
Hemoglobin: 10.7 g/dL — ABNORMAL LOW (ref 12.0–15.0)
MCH: 29.7 pg (ref 26.0–34.0)
Platelets: 174 10*3/uL (ref 150–400)
RBC: 3.6 MIL/uL — ABNORMAL LOW (ref 3.87–5.11)
WBC: 11.8 10*3/uL — ABNORMAL HIGH (ref 4.0–10.5)

## 2012-11-16 IMAGING — CT CT ABD-PELV W/O CM
2 of 4 series · 17 of 46 positions shown, 19 images · non-contrast
Comparison: None.

CLINICAL DATA: Diffuse abdominal pain.

EXAM:
CT ABDOMEN AND PELVIS WITHOUT CONTRAST
TECHNIQUE: Multidetector CT imaging of the abdomen and pelvis was performed
following the standard protocol without intravenous contrast.

[Series 2: abd/ pelvis 5.0 i30f 1 · axial · 0.81mm/px · z∈[-726,-376]mm · 14 of 78 slices shown, 16 images]
[im 4/78  soft-tissue]
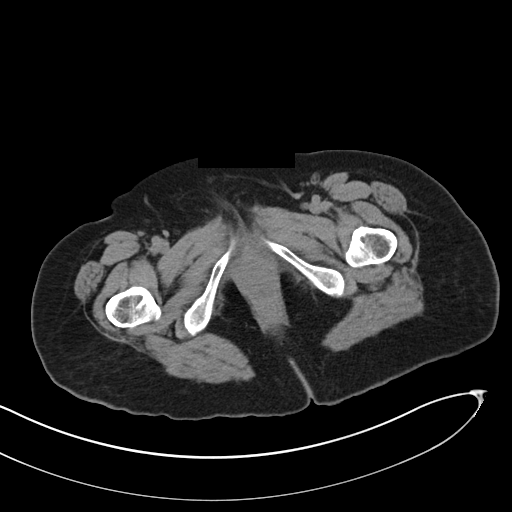
[im 4/78  bone]
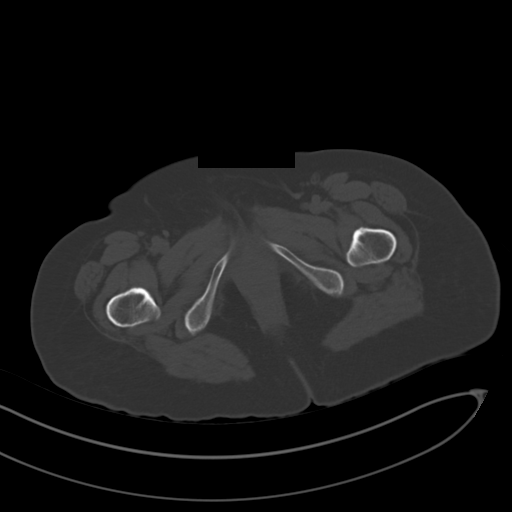
[im 10/78  soft-tissue]
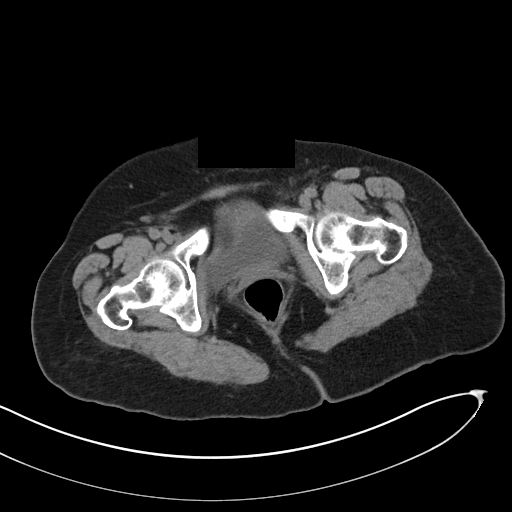
[im 17/78  soft-tissue]
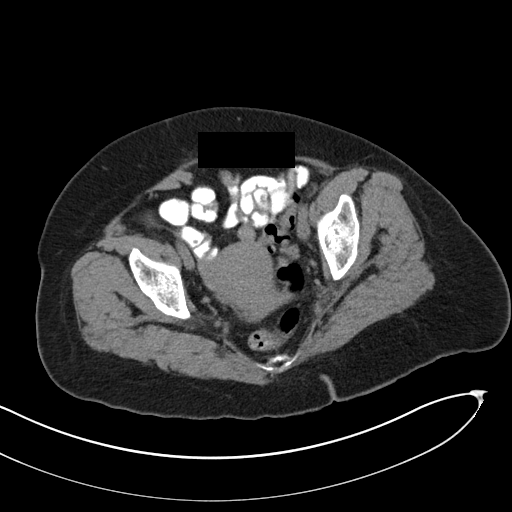
[im 20/78  soft-tissue]
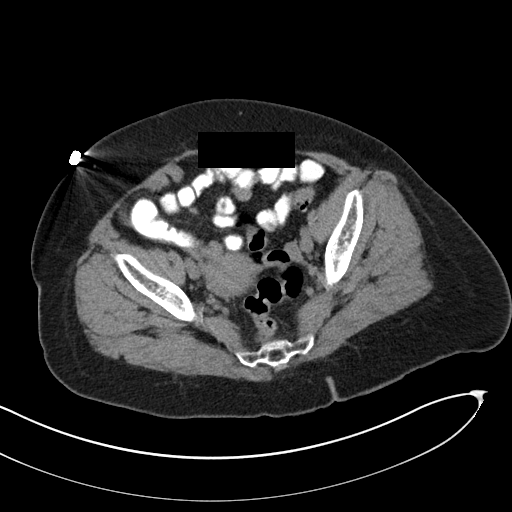
[im 26/78  soft-tissue]
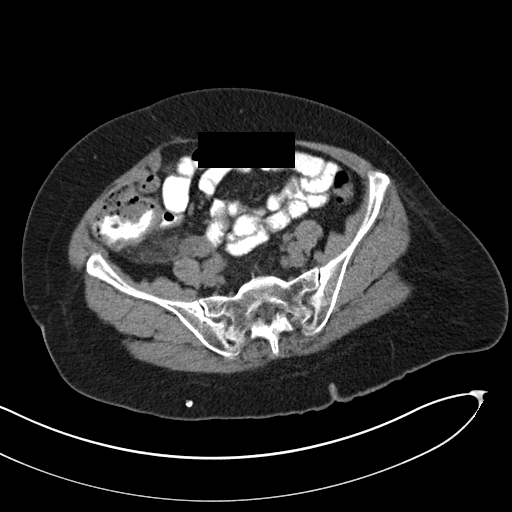
[im 33/78  soft-tissue]
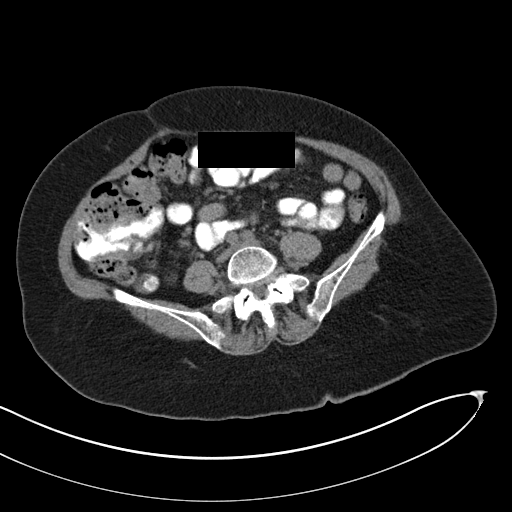
[im 36/78  soft-tissue]
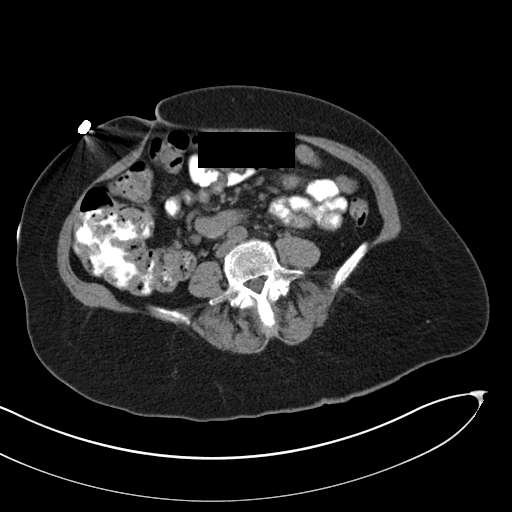
[im 42/78  soft-tissue]
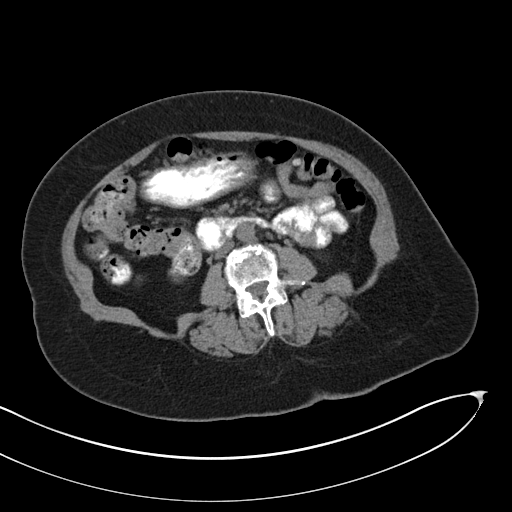
[im 45/78  soft-tissue]
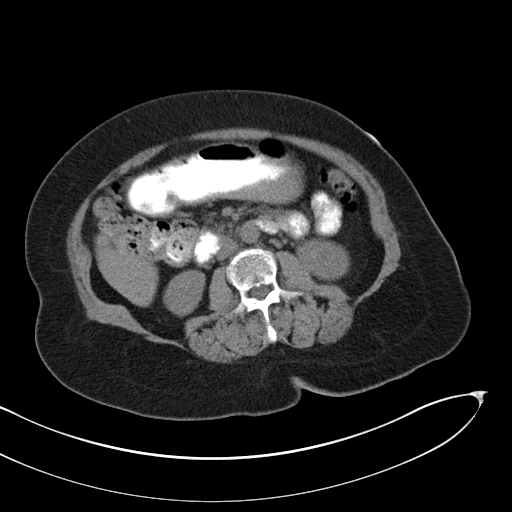
[im 45/78  bone]
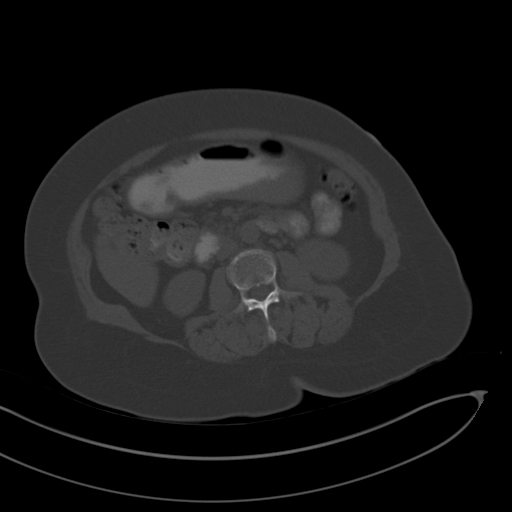
[im 52/78  soft-tissue]
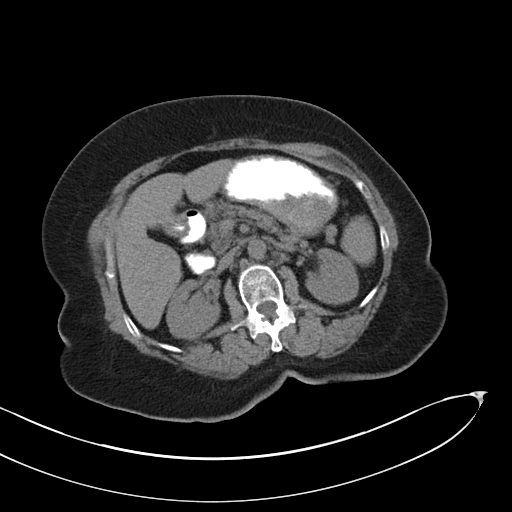
[im 58/78  soft-tissue]
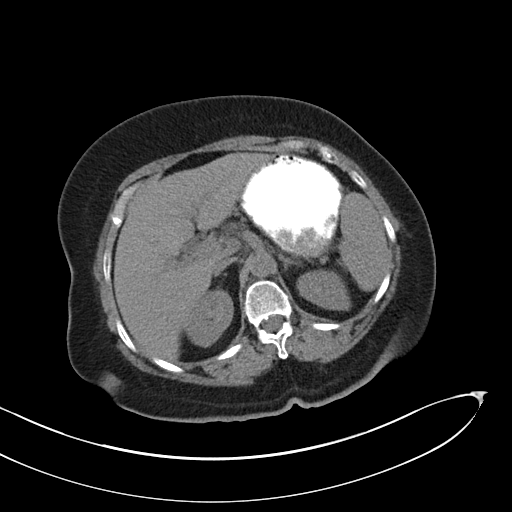
[im 61/78  soft-tissue]
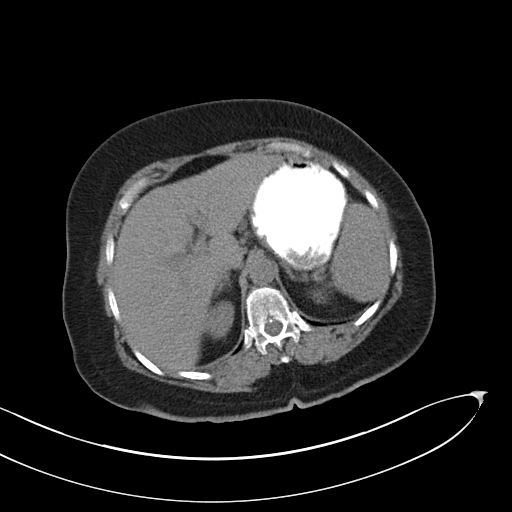
[im 68/78  soft-tissue]
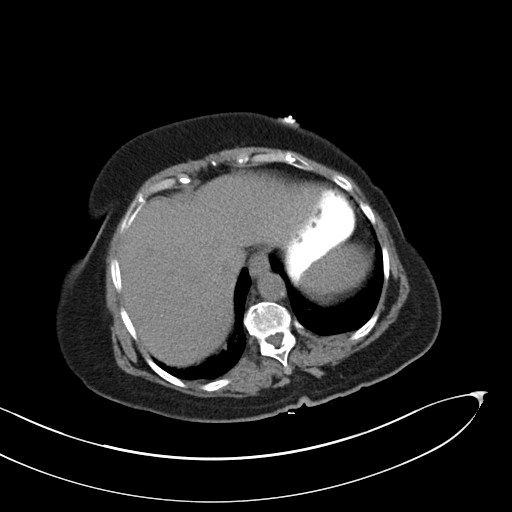
[im 74/78  soft-tissue]
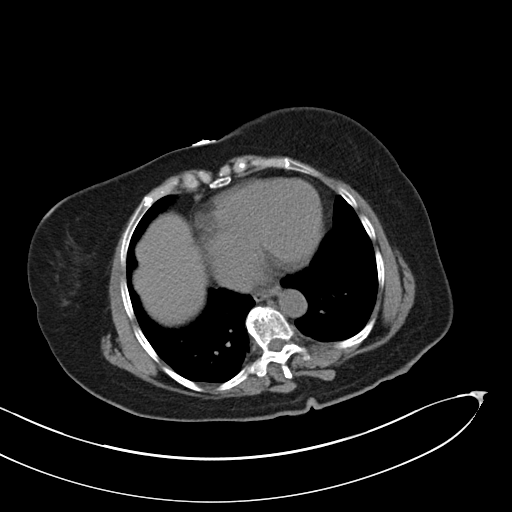

[Series 5: cor st · coronal · 0.75mm/px · 3 of 69 slices shown]
[im 23/69  soft-tissue]
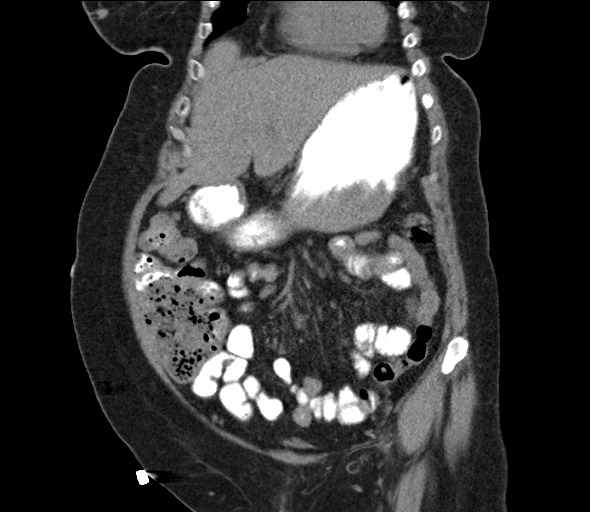
[im 31/69  soft-tissue]
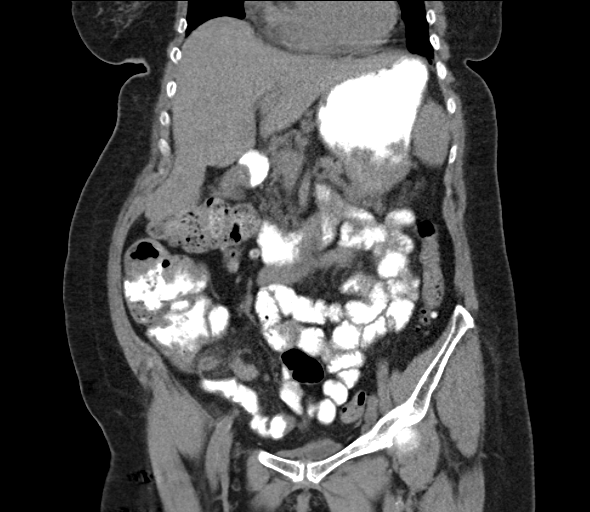
[im 38/69  soft-tissue]
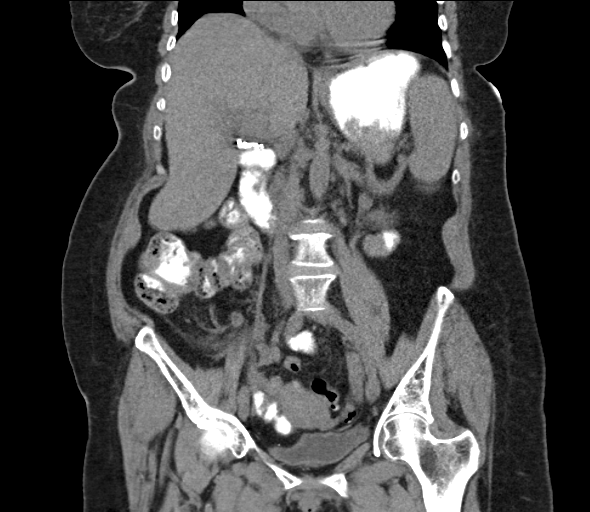

[17 of 46 positions shown; findings below may reference images not displayed]

FINDINGS: The examination is limited by the lack of intravenous contrast. The
appendix is markedly enlarged and contains an appendicolith. The
appendix measures 12 mm in maximum diameter on image number 56.
Periappendiceal soft tissue stranding is noted. Also noted are
multiple mildly enlarged lymph nodes in the right lower abdominal
mesentery.

No fluid collections or free peritoneal air. Unremarkable uterus,
ovaries, urinary bladder, kidneys, liver, spleen and pancreas.
Normal appearing adrenal glands. Cholecystectomy clips. Multiple
colonic diverticula without evidence of diverticulitis. Minimal
bibasilar lung atelectasis or scarring. Right L5 pars
interarticularis defect with minimal anterolisthesis at the L5-S1
level. The left L5 pars is intact.
IMPRESSION: 1. Acute appendicitis without abscess.
2. Mild reactive mesenteric adenopathy in the right lower abdomen.
3. Right L5 spondylolysis with minimal grade 1 spondylolisthesis at
the L5-S1 level.
These results were called by telephone on [DATE] at [SL] hours to
NARCIUS., who verbally acknowledged these results.

## 2012-11-16 SURGERY — APPENDECTOMY, LAPAROSCOPIC
Anesthesia: General | Site: Abdomen | Wound class: Contaminated

## 2012-11-16 MED ORDER — ONDANSETRON HCL 4 MG/2ML IJ SOLN: 4.0000 mg | Freq: Four times a day (QID) | INTRAMUSCULAR | Status: DC | PRN

## 2012-11-16 MED ORDER — PHENYLEPHRINE HCL 10 MG/ML IJ SOLN
INTRAMUSCULAR | Status: DC | PRN
  Administered 2012-11-16 (×8): 80 ug via INTRAVENOUS

## 2012-11-16 MED ORDER — LACTATED RINGERS IV SOLN
INTRAVENOUS | Status: DC | PRN
  Administered 2012-11-16 (×2): via INTRAVENOUS

## 2012-11-16 MED ORDER — OXYCODONE HCL 5 MG PO TABS: 5.0000 mg | ORAL_TABLET | Freq: Once | ORAL | Status: DC | PRN

## 2012-11-16 MED ORDER — DIPHENHYDRAMINE HCL 12.5 MG/5ML PO ELIX: 12.5000 mg | ORAL_SOLUTION | Freq: Four times a day (QID) | ORAL | Status: DC | PRN

## 2012-11-16 MED ORDER — CIPROFLOXACIN IN D5W 400 MG/200ML IV SOLN
400.0000 mg | Freq: Once | INTRAVENOUS | Status: AC
  Administered 2012-11-16: 400 mg via INTRAVENOUS
  Filled 2012-11-16: qty 200

## 2012-11-16 MED ORDER — MORPHINE SULFATE 4 MG/ML IJ SOLN
4.0000 mg | Freq: Once | INTRAMUSCULAR | Status: AC
  Administered 2012-11-16: 4 mg via INTRAVENOUS
  Filled 2012-11-16: qty 1

## 2012-11-16 MED ORDER — GLYCOPYRROLATE 0.2 MG/ML IJ SOLN
INTRAMUSCULAR | Status: DC | PRN
  Administered 2012-11-16: 0.4 mg via INTRAVENOUS

## 2012-11-16 MED ORDER — MEPERIDINE HCL 25 MG/ML IJ SOLN: 6.2500 mg | INTRAMUSCULAR | Status: DC | PRN

## 2012-11-16 MED ORDER — CIPROFLOXACIN IN D5W 400 MG/200ML IV SOLN
400.0000 mg | Freq: Two times a day (BID) | INTRAVENOUS | Status: DC
  Administered 2012-11-16 – 2012-11-17 (×2): 400 mg via INTRAVENOUS
  Filled 2012-11-16 (×3): qty 200

## 2012-11-16 MED ORDER — ONDANSETRON HCL 4 MG/2ML IJ SOLN
INTRAMUSCULAR | Status: DC | PRN
  Administered 2012-11-16: 4 mg via INTRAVENOUS

## 2012-11-16 MED ORDER — PROMETHAZINE HCL 25 MG/ML IJ SOLN: 6.2500 mg | INTRAMUSCULAR | Status: DC | PRN

## 2012-11-16 MED ORDER — FENTANYL CITRATE 0.05 MG/ML IJ SOLN
INTRAMUSCULAR | Status: DC | PRN
  Administered 2012-11-16: 50 ug via INTRAVENOUS
  Administered 2012-11-16: 150 ug via INTRAVENOUS
  Administered 2012-11-16: 50 ug via INTRAVENOUS

## 2012-11-16 MED ORDER — SODIUM CHLORIDE 0.9 % IR SOLN
Status: DC | PRN
  Administered 2012-11-16: 1000 mL

## 2012-11-16 MED ORDER — CLINDAMYCIN PHOSPHATE 600 MG/50ML IV SOLN
600.0000 mg | Freq: Four times a day (QID) | INTRAVENOUS | Status: DC
  Administered 2012-11-16 – 2012-11-17 (×5): 600 mg via INTRAVENOUS
  Filled 2012-11-16 (×7): qty 50

## 2012-11-16 MED ORDER — ONDANSETRON HCL 4 MG/2ML IJ SOLN
4.0000 mg | INTRAMUSCULAR | Status: AC
  Administered 2012-11-16: 4 mg via INTRAVENOUS
  Filled 2012-11-16: qty 2

## 2012-11-16 MED ORDER — PANTOPRAZOLE SODIUM 40 MG PO TBEC
40.0000 mg | DELAYED_RELEASE_TABLET | Freq: Every day | ORAL | Status: DC
  Administered 2012-11-16 – 2012-11-17 (×2): 40 mg via ORAL
  Filled 2012-11-16 (×2): qty 1

## 2012-11-16 MED ORDER — MIDAZOLAM HCL 5 MG/5ML IJ SOLN
INTRAMUSCULAR | Status: DC | PRN
  Administered 2012-11-16: 2 mg via INTRAVENOUS

## 2012-11-16 MED ORDER — ACETAMINOPHEN 325 MG PO TABS: 650.0000 mg | ORAL_TABLET | Freq: Four times a day (QID) | ORAL | Status: DC | PRN

## 2012-11-16 MED ORDER — HYDROCODONE-ACETAMINOPHEN 5-325 MG PO TABS
1.0000 | ORAL_TABLET | ORAL | Status: DC | PRN
  Administered 2012-11-16 – 2012-11-17 (×3): 2 via ORAL
  Filled 2012-11-16 (×3): qty 2

## 2012-11-16 MED ORDER — MIDAZOLAM HCL 2 MG/2ML IJ SOLN: 0.5000 mg | Freq: Once | INTRAMUSCULAR | Status: DC | PRN

## 2012-11-16 MED ORDER — SCOPOLAMINE 1 MG/3DAYS TD PT72
1.0000 | MEDICATED_PATCH | TRANSDERMAL | Status: DC
  Administered 2012-11-16: 1.5 mg via TRANSDERMAL
  Filled 2012-11-16: qty 1

## 2012-11-16 MED ORDER — KCL IN DEXTROSE-NACL 20-5-0.45 MEQ/L-%-% IV SOLN
INTRAVENOUS | Status: DC
  Administered 2012-11-16: 05:00:00 via INTRAVENOUS
  Filled 2012-11-16 (×2): qty 1000

## 2012-11-16 MED ORDER — ACETAMINOPHEN 650 MG RE SUPP: 650.0000 mg | Freq: Four times a day (QID) | RECTAL | Status: DC | PRN

## 2012-11-16 MED ORDER — NEOSTIGMINE METHYLSULFATE 1 MG/ML IJ SOLN
INTRAMUSCULAR | Status: DC | PRN
  Administered 2012-11-16: 3 mg via INTRAVENOUS

## 2012-11-16 MED ORDER — BUPIVACAINE-EPINEPHRINE 0.25% -1:200000 IJ SOLN
INTRAMUSCULAR | Status: DC | PRN
  Administered 2012-11-16: 8 mL

## 2012-11-16 MED ORDER — OXYCODONE HCL 5 MG/5ML PO SOLN: 5.0000 mg | Freq: Once | ORAL | Status: DC | PRN

## 2012-11-16 MED ORDER — HYDROMORPHONE HCL PF 1 MG/ML IJ SOLN: 0.2500 mg | INTRAMUSCULAR | Status: DC | PRN

## 2012-11-16 MED ORDER — KCL IN DEXTROSE-NACL 20-5-0.45 MEQ/L-%-% IV SOLN
INTRAVENOUS | Status: DC
  Administered 2012-11-17: 02:00:00 via INTRAVENOUS
  Filled 2012-11-16 (×3): qty 1000

## 2012-11-16 MED ORDER — SODIUM CHLORIDE 0.9 % IV BOLUS (SEPSIS)
1000.0000 mL | Freq: Once | INTRAVENOUS | Status: AC
  Administered 2012-11-16: 1000 mL via INTRAVENOUS

## 2012-11-16 MED ORDER — TIZANIDINE HCL 4 MG PO TABS
4.0000 mg | ORAL_TABLET | Freq: Four times a day (QID) | ORAL | Status: DC | PRN
  Filled 2012-11-16: qty 1

## 2012-11-16 MED ORDER — LIDOCAINE HCL (CARDIAC) 20 MG/ML IV SOLN
INTRAVENOUS | Status: DC | PRN
  Administered 2012-11-16: 30 mg via INTRAVENOUS

## 2012-11-16 MED ORDER — ROCURONIUM BROMIDE 100 MG/10ML IV SOLN
INTRAVENOUS | Status: DC | PRN
  Administered 2012-11-16: 25 mg via INTRAVENOUS

## 2012-11-16 MED ORDER — BUPIVACAINE-EPINEPHRINE PF 0.25-1:200000 % IJ SOLN
INTRAMUSCULAR | Status: AC
  Filled 2012-11-16: qty 30

## 2012-11-16 MED ORDER — MORPHINE SULFATE 2 MG/ML IJ SOLN: 1.0000 mg | INTRAMUSCULAR | Status: DC | PRN

## 2012-11-16 MED ORDER — SUCCINYLCHOLINE CHLORIDE 20 MG/ML IJ SOLN
INTRAMUSCULAR | Status: DC | PRN
  Administered 2012-11-16: 120 mg via INTRAVENOUS

## 2012-11-16 MED ORDER — NITROGLYCERIN 0.4 MG SL SUBL: 0.4000 mg | SUBLINGUAL_TABLET | SUBLINGUAL | Status: DC | PRN

## 2012-11-16 MED ORDER — PROPOFOL 10 MG/ML IV BOLUS
INTRAVENOUS | Status: DC | PRN
  Administered 2012-11-16: 140 mg via INTRAVENOUS

## 2012-11-16 MED ORDER — CIPROFLOXACIN IN D5W 400 MG/200ML IV SOLN
400.0000 mg | Freq: Two times a day (BID) | INTRAVENOUS | Status: DC
  Filled 2012-11-16 (×2): qty 200

## 2012-11-16 MED ORDER — DIPHENHYDRAMINE HCL 50 MG/ML IJ SOLN: 12.5000 mg | Freq: Four times a day (QID) | INTRAMUSCULAR | Status: DC | PRN

## 2012-11-16 MED ORDER — HYDROMORPHONE HCL PF 1 MG/ML IJ SOLN
INTRAMUSCULAR | Status: AC
  Filled 2012-11-16: qty 1

## 2012-11-16 MED ORDER — DEXAMETHASONE SODIUM PHOSPHATE 4 MG/ML IJ SOLN
INTRAMUSCULAR | Status: DC | PRN
  Administered 2012-11-16: 4 mg via INTRAVENOUS

## 2012-11-16 MED ORDER — CLINDAMYCIN PHOSPHATE 600 MG/50ML IV SOLN
600.0000 mg | Freq: Once | INTRAVENOUS | Status: AC
  Administered 2012-11-16: 600 mg via INTRAVENOUS
  Filled 2012-11-16: qty 50

## 2012-11-16 MED ORDER — HEPARIN SODIUM (PORCINE) 5000 UNIT/ML IJ SOLN
5000.0000 [IU] | Freq: Three times a day (TID) | INTRAMUSCULAR | Status: DC
  Administered 2012-11-17: 5000 [IU] via SUBCUTANEOUS
  Filled 2012-11-16 (×5): qty 1

## 2012-11-16 SURGICAL SUPPLY — 51 items
APL SKNCLS STERI-STRIP NONHPOA (GAUZE/BANDAGES/DRESSINGS) ×2
APPLIER CLIP ROT 10 11.4 M/L (STAPLE)
APR CLP MED LRG 11.4X10 (STAPLE)
BAG SPEC RTRVL LRG 6X4 10 (ENDOMECHANICALS) ×2
BENZOIN TINCTURE PRP APPL 2/3 (GAUZE/BANDAGES/DRESSINGS) ×3 IMPLANT
BLADE SURG ROTATE 9660 (MISCELLANEOUS) IMPLANT
CANISTER SUCTION 2500CC (MISCELLANEOUS) ×3 IMPLANT
CHLORAPREP W/TINT 26ML (MISCELLANEOUS) ×3 IMPLANT
CLIP APPLIE ROT 10 11.4 M/L (STAPLE) IMPLANT
CLOTH BEACON ORANGE TIMEOUT ST (SAFETY) ×3 IMPLANT
CLSR STERI-STRIP ANTIMIC 1/2X4 (GAUZE/BANDAGES/DRESSINGS) ×2 IMPLANT
COVER SURGICAL LIGHT HANDLE (MISCELLANEOUS) ×3 IMPLANT
CUTTER FLEX LINEAR 45M (STAPLE) ×3 IMPLANT
DECANTER SPIKE VIAL GLASS SM (MISCELLANEOUS) ×3 IMPLANT
DRAPE UTILITY 15X26 W/TAPE STR (DRAPE) ×6 IMPLANT
DRSG TEGADERM 2-3/8X2-3/4 SM (GAUZE/BANDAGES/DRESSINGS) ×2 IMPLANT
DRSG TEGADERM 4X4.75 (GAUZE/BANDAGES/DRESSINGS) ×1 IMPLANT
ELECT REM PT RETURN 9FT ADLT (ELECTROSURGICAL) ×3
ELECTRODE REM PT RTRN 9FT ADLT (ELECTROSURGICAL) ×2 IMPLANT
ENDOLOOP SUT PDS II  0 18 (SUTURE)
ENDOLOOP SUT PDS II 0 18 (SUTURE) IMPLANT
FILTER SMOKE EVAC LAPAROSHD (FILTER) ×3 IMPLANT
GAUZE SPONGE 2X2 8PLY STRL LF (GAUZE/BANDAGES/DRESSINGS) ×2 IMPLANT
GLOVE BIO SURGEON STRL SZ7 (GLOVE) ×3 IMPLANT
GLOVE BIO SURGEON STRL SZ8 (GLOVE) ×1 IMPLANT
GLOVE BIOGEL PI IND STRL 6 (GLOVE) ×1 IMPLANT
GLOVE BIOGEL PI IND STRL 7.5 (GLOVE) ×2 IMPLANT
GLOVE BIOGEL PI INDICATOR 6 (GLOVE) ×1
GLOVE BIOGEL PI INDICATOR 7.5 (GLOVE) ×1
GOWN PREVENTION PLUS XLARGE (GOWN DISPOSABLE) ×1 IMPLANT
GOWN STRL NON-REIN LRG LVL3 (GOWN DISPOSABLE) ×9 IMPLANT
KIT BASIN OR (CUSTOM PROCEDURE TRAY) ×3 IMPLANT
KIT ROOM TURNOVER OR (KITS) ×3 IMPLANT
NS IRRIG 1000ML POUR BTL (IV SOLUTION) ×3 IMPLANT
PAD ARMBOARD 7.5X6 YLW CONV (MISCELLANEOUS) ×6 IMPLANT
POUCH SPECIMEN RETRIEVAL 10MM (ENDOMECHANICALS) ×3 IMPLANT
RELOAD STAPLE 45 3.5 BLU ETS (ENDOMECHANICALS) ×2 IMPLANT
RELOAD STAPLE TA45 3.5 REG BLU (ENDOMECHANICALS) ×3 IMPLANT
SCALPEL HARMONIC ACE (MISCELLANEOUS) ×3 IMPLANT
SET IRRIG TUBING LAPAROSCOPIC (IRRIGATION / IRRIGATOR) ×3 IMPLANT
SPECIMEN JAR SMALL (MISCELLANEOUS) ×3 IMPLANT
SPONGE GAUZE 2X2 STER 10/PKG (GAUZE/BANDAGES/DRESSINGS) ×1
SUT MNCRL AB 4-0 PS2 18 (SUTURE) ×3 IMPLANT
TAPE CLOTH SURG 4X10 WHT LF (GAUZE/BANDAGES/DRESSINGS) ×1 IMPLANT
TOWEL OR 17X24 6PK STRL BLUE (TOWEL DISPOSABLE) ×3 IMPLANT
TOWEL OR 17X26 10 PK STRL BLUE (TOWEL DISPOSABLE) ×3 IMPLANT
TRAY FOLEY CATH 16FR SILVER (SET/KITS/TRAYS/PACK) ×3 IMPLANT
TRAY LAPAROSCOPIC (CUSTOM PROCEDURE TRAY) ×3 IMPLANT
TROCAR XCEL BLADELESS 5X75MML (TROCAR) ×6 IMPLANT
TROCAR XCEL BLUNT TIP 100MML (ENDOMECHANICALS) ×3 IMPLANT
WATER STERILE IRR 1000ML POUR (IV SOLUTION) IMPLANT

## 2012-11-16 NOTE — Anesthesia Preprocedure Evaluation (Addendum)
Anesthesia Evaluation  Patient identified by MRN, date of birth, ID band Patient awake    Reviewed: Allergy & Precautions, H&P , NPO status , Patient's Chart, lab work & pertinent test results  History of Anesthesia Complications (+) PONV  Airway Mallampati: II TM Distance: >3 FB Neck ROM: Full    Dental  (+) Teeth Intact and Dental Advisory Given   Pulmonary neg pulmonary ROS,  breath sounds clear to auscultation  Pulmonary exam normal       Cardiovascular hypertension, Pt. on medications + angina (last NTG 3 months ago) Rhythm:Regular Rate:Normal  '10 stress test: no ischemic changes, EF 68%   Neuro/Psych negative neurological ROS     GI/Hepatic Neg liver ROS, GERD-  Medicated and Controlled,  Endo/Other  negative endocrine ROSDiabetes: glu 162.  Renal/GU negative Renal ROS     Musculoskeletal   Abdominal   Peds  Hematology  (+) Blood dyscrasia (Hb 10.7), anemia ,   Anesthesia Other Findings   Reproductive/Obstetrics                          Anesthesia Physical Anesthesia Plan  ASA: III  Anesthesia Plan: General   Post-op Pain Management:    Induction: Intravenous and Rapid sequence  Airway Management Planned: Oral ETT  Additional Equipment:   Intra-op Plan:   Post-operative Plan: Extubation in OR  Informed Consent: I have reviewed the patients History and Physical, chart, labs and discussed the procedure including the risks, benefits and alternatives for the proposed anesthesia with the patient or authorized representative who has indicated his/her understanding and acceptance.   Dental advisory given  Plan Discussed with: CRNA and Surgeon  Anesthesia Plan Comments: (Plan routine monitors, GETA )        Anesthesia Quick Evaluation

## 2012-11-16 NOTE — H&P (Signed)
Leah Pruitt is an 67 y.o. female.   Chief Complaint: abdominal pain HPI:  Pt is a 67 yo F who presents with around 24 hours of lower abdominal pain that woke her from sleep.  She had decreased appetite yesterday.  She denies fevers/ chills.  She has not had any nausea or vomiting.  She had already had her gallbladder removed.  She denies dysuria.  She is bloated.  No constipation or diarrhea.    Past Medical History  Diagnosis Date  . HTN (hypertension)   . FH: cholecystectomy   . Back pain   . Osteoporosis   . Gastroesophageal reflux disease     Past Surgical History  Procedure Laterality Date  . Cesarean section    . Cholecystectomy      Family History  Problem Relation Age of Onset  . Cancer Father 61    deceased  . Stroke Mother 75    deceased  . Hypertension Sister    Social History:  reports that she has never smoked. She does not have any smokeless tobacco history on file. She reports that she does not drink alcohol or use illicit drugs.  Allergies:  Allergies  Allergen Reactions  . Penicillins Other (See Comments)    Numbness of mouth and dry mouth  . Iodine Swelling   MedicationsLong-Term  Outpatient Medications Hospital Medications    diclofenac (VOLTAREN) 25 MG EC tablet   diltiazem (TIAZAC) 120 MG 24 hr capsule   magnesium hydroxide (MILK OF MAGNESIA) 800 MG/5ML suspension   nitroGLYCERIN (NITROSTAT) 0.4 MG SL tablet   olmesartan (BENICAR) 20 MG tablet   OVER THE COUNTER MEDICATION   RABEprazole Sodium (ACIPHEX PO)   tiZANidine (ZANAFLEX) 4 MG tablet      Results for orders placed during the hospital encounter of 11/15/12 (from the past 48 hour(s))  CBC WITH DIFFERENTIAL     Status: Abnormal   Collection Time    11/15/12  8:36 PM      Result Value Range   WBC 12.7 (*) 4.0 - 10.5 K/uL   RBC 4.03  3.87 - 5.11 MIL/uL   Hemoglobin 12.3  12.0 - 15.0 g/dL   HCT 29.5 (*) 62.1 - 30.8 %   MCV 87.1  78.0 - 100.0 fL   MCH 30.5  26.0 - 34.0 pg   MCHC  35.0  30.0 - 36.0 g/dL   RDW 65.7  84.6 - 96.2 %   Platelets 206  150 - 400 K/uL   Neutrophils Relative % 87 (*) 43 - 77 %   Neutro Abs 11.0 (*) 1.7 - 7.7 K/uL   Lymphocytes Relative 7 (*) 12 - 46 %   Lymphs Abs 0.9  0.7 - 4.0 K/uL   Monocytes Relative 6  3 - 12 %   Monocytes Absolute 0.8  0.1 - 1.0 K/uL   Eosinophils Relative 0  0 - 5 %   Eosinophils Absolute 0.0  0.0 - 0.7 K/uL   Basophils Relative 0  0 - 1 %   Basophils Absolute 0.0  0.0 - 0.1 K/uL  COMPREHENSIVE METABOLIC PANEL     Status: Abnormal   Collection Time    11/15/12  8:36 PM      Result Value Range   Sodium 139  135 - 145 mEq/L   Potassium 3.9  3.5 - 5.1 mEq/L   Chloride 102  96 - 112 mEq/L   CO2 28  19 - 32 mEq/L   Glucose, Bld 170 (*) 70 -  99 mg/dL   BUN 22  6 - 23 mg/dL   Creatinine, Ser 0.98  0.50 - 1.10 mg/dL   Calcium 9.5  8.4 - 11.9 mg/dL   Total Protein 7.1  6.0 - 8.3 g/dL   Albumin 4.2  3.5 - 5.2 g/dL   AST 19  0 - 37 U/L   ALT 17  0 - 35 U/L   Alkaline Phosphatase 105  39 - 117 U/L   Total Bilirubin 0.3  0.3 - 1.2 mg/dL   GFR calc non Af Amer 71 (*) >90 mL/min   GFR calc Af Amer 83 (*) >90 mL/min   Comment: (NOTE)     The eGFR has been calculated using the CKD EPI equation.     This calculation has not been validated in all clinical situations.     eGFR's persistently <90 mL/min signify possible Chronic Kidney     Disease.  LIPASE, BLOOD     Status: None   Collection Time    11/15/12  8:36 PM      Result Value Range   Lipase 15  11 - 59 U/L  POCT I-STAT TROPONIN I     Status: None   Collection Time    11/15/12  8:55 PM      Result Value Range   Troponin i, poc 0.00  0.00 - 0.08 ng/mL   Comment 3            Comment: Due to the release kinetics of cTnI,     a negative result within the first hours     of the onset of symptoms does not rule out     myocardial infarction with certainty.     If myocardial infarction is still suspected,     repeat the test at appropriate intervals.    URINALYSIS, ROUTINE W REFLEX MICROSCOPIC     Status: Abnormal   Collection Time    11/15/12  9:57 PM      Result Value Range   Color, Urine YELLOW  YELLOW   APPearance CLOUDY (*) CLEAR   Specific Gravity, Urine 1.014  1.005 - 1.030   pH 8.0  5.0 - 8.0   Glucose, UA NEGATIVE  NEGATIVE mg/dL   Hgb urine dipstick NEGATIVE  NEGATIVE   Bilirubin Urine NEGATIVE  NEGATIVE   Ketones, ur NEGATIVE  NEGATIVE mg/dL   Protein, ur NEGATIVE  NEGATIVE mg/dL   Urobilinogen, UA 0.2  0.0 - 1.0 mg/dL   Nitrite NEGATIVE  NEGATIVE   Leukocytes, UA SMALL (*) NEGATIVE  URINE MICROSCOPIC-ADD ON     Status: Abnormal   Collection Time    11/15/12  9:57 PM      Result Value Range   Squamous Epithelial / LPF RARE  RARE   Comment: RARE   WBC, UA 3-6  <3 WBC/hpf   Comment: 3-6   Bacteria, UA MANY (*) RARE   Comment: MANY   Ct Abdomen Pelvis Wo Contrast  11/16/2012   CLINICAL DATA:  Diffuse abdominal pain.  EXAM: CT ABDOMEN AND PELVIS WITHOUT CONTRAST  TECHNIQUE: Multidetector CT imaging of the abdomen and pelvis was performed following the standard protocol without intravenous contrast.  COMPARISON:  None.  FINDINGS: The examination is limited by the lack of intravenous contrast. The appendix is markedly enlarged and contains an appendicolith. The appendix measures 12 mm in maximum diameter on image number 56. Periappendiceal soft tissue stranding is noted. Also noted are multiple mildly enlarged lymph nodes in the right lower  abdominal mesentery.  No fluid collections or free peritoneal air. Unremarkable uterus, ovaries, urinary bladder, kidneys, liver, spleen and pancreas. Normal appearing adrenal glands. Cholecystectomy clips. Multiple colonic diverticula without evidence of diverticulitis. Minimal bibasilar lung atelectasis or scarring. Right L5 pars interarticularis defect with minimal anterolisthesis at the L5-S1 level. The left L5 pars is intact.  IMPRESSION: 1. Acute appendicitis without abscess. 2. Mild  reactive mesenteric adenopathy in the right lower abdomen. 3. Right L5 spondylolysis with minimal grade 1 spondylolisthesis at the L5-S1 level. These results were called by telephone on 11/16/2012 at 0104 hours to Leah Pruitt, P.A., who verbally acknowledged these results.   Electronically Signed   By: Gordan Payment   On: 11/16/2012 01:04    Review of Systems  Constitutional: Negative for fever, chills and diaphoresis.  HENT: Negative.   Eyes: Negative.   Respiratory: Negative.   Cardiovascular: Positive for chest pain (occasional).  Gastrointestinal: Positive for abdominal pain. Negative for nausea and vomiting.       Anorexia yesterday   Genitourinary: Negative.   Musculoskeletal: Negative.   Skin: Negative.   Neurological: Negative.   Endo/Heme/Allergies: Negative.   Psychiatric/Behavioral: Negative.     Blood pressure 103/69, pulse 109, temperature 98.2 F (36.8 C), temperature source Oral, SpO2 96.00%. Physical Exam  Constitutional: She is oriented to person, place, and time. She appears well-developed and well-nourished. She appears distressed.  HENT:  Head: Normocephalic and atraumatic.  Nose: Nose normal.  Eyes: Conjunctivae are normal. Pupils are equal, round, and reactive to light. No scleral icterus.  Neck: Normal range of motion. No thyromegaly present.  Cardiovascular: Normal rate, regular rhythm, normal heart sounds and intact distal pulses.  Exam reveals no gallop and no friction rub.   No murmur heard. Respiratory: Effort normal and breath sounds normal. No respiratory distress. She has no wheezes. She has no rales. She exhibits no tenderness.  GI: Soft. She exhibits distension. She exhibits no mass. There is tenderness (RLQ). There is guarding (voluntary guarding). There is no rebound.  Musculoskeletal: She exhibits no edema and no tenderness.  Neurological: She is alert and oriented to person, place, and time.  Skin: Skin is warm and dry. No rash noted. She is not  diaphoretic. No erythema. No pallor.  Psychiatric: She has a normal mood and affect. Her behavior is normal. Judgment and thought content normal.     Assessment/Plan Acute appendicitis. IVF IV antibiotics OR for lap appy with myself or next surgeon.   Discussed risks and benefits.   NPO   Mckayla Mulcahey 11/16/2012, 2:20 AM

## 2012-11-16 NOTE — ED Provider Notes (Signed)
Medical screening examination/treatment/procedure(s) were performed by non-physician practitioner and as supervising physician I was immediately available for consultation/collaboration.      Lyanne Co, MD 11/16/12 2245

## 2012-11-16 NOTE — Anesthesia Postprocedure Evaluation (Signed)
  Anesthesia Post-op Note  Patient: Leah Pruitt  Procedure(s) Performed: Procedure(s): APPENDECTOMY LAPAROSCOPIC (N/A) LAPAROSCOPIC LYSIS OF ADHESIONS (Bilateral)  Patient Location: PACU  Anesthesia Type:General  Level of Consciousness: awake, alert , oriented and patient cooperative  Airway and Oxygen Therapy: Patient Spontanous Breathing and Patient connected to nasal cannula oxygen  Post-op Pain: none  Post-op Assessment: Post-op Vital signs reviewed, Patient's Cardiovascular Status Stable, Respiratory Function Stable, Patent Airway, No signs of Nausea or vomiting and Pain level controlled  Post-op Vital Signs: Reviewed and stable  Complications: No apparent anesthesia complications

## 2012-11-16 NOTE — Preoperative (Signed)
Beta Blockers   Reason not to administer Beta Blockers:Not Applicable 

## 2012-11-16 NOTE — Transfer of Care (Signed)
Immediate Anesthesia Transfer of Care Note  Patient: Leah Pruitt  Procedure(s) Performed: Procedure(s): APPENDECTOMY LAPAROSCOPIC (N/A) LAPAROSCOPIC LYSIS OF ADHESIONS (Bilateral)  Patient Location: PACU  Anesthesia Type:General  Level of Consciousness: awake, alert , oriented and patient cooperative  Airway & Oxygen Therapy: Patient Spontanous Breathing and Patient connected to nasal cannula oxygen  Post-op Assessment: Report given to PACU RN, Post -op Vital signs reviewed and stable and Patient moving all extremities X 4  Post vital signs: Reviewed and stable  Complications: No apparent anesthesia complications

## 2012-11-16 NOTE — Progress Notes (Signed)
Utilization review completed.  

## 2012-11-16 NOTE — Anesthesia Procedure Notes (Signed)
Procedure Name: Intubation Date/Time: 11/16/2012 8:03 AM Performed by: Everlene Balls TODD Pre-anesthesia Checklist: Patient identified, Emergency Drugs available, Suction available, Patient being monitored and Timeout performed Patient Re-evaluated:Patient Re-evaluated prior to inductionOxygen Delivery Method: Circle system utilized Preoxygenation: Pre-oxygenation with 100% oxygen Intubation Type: IV induction, Cricoid Pressure applied and Rapid sequence Laryngoscope Size: Mac and 3 Grade View: Grade II Tube type: Oral Tube size: 7.5 mm Number of attempts: 4 Placement Confirmation: ETT inserted through vocal cords under direct vision,  positive ETCO2,  CO2 detector and breath sounds checked- equal and bilateral Secured at: 22 cm Tube secured with: Tape Dental Injury: Teeth and Oropharynx as per pre-operative assessment  Future Recommendations: Recommend- induction with short-acting agent, and alternative techniques readily available Comments: Difficulty 2* to pts shorter neck and large chest.  DL x 2 per CRNA, 2nd attempt w bougie stylet--unable to pass through VC. DL x 2 per MDA,-unable to pass bougie,  reposition pts head/neck-, intubated w/o difficulty.

## 2012-11-16 NOTE — Op Note (Signed)
Appendectomy, Lap, Procedure Note Laparoscopic lysis of adhesions  Indications: The patient presented with a history of right-sided abdominal pain. A CT scan revealed findings consistent with acute appendicitis.  Pre-operative Diagnosis: Acute appendicitis without mention of peritonitis  Post-operative Diagnosis: Same  Surgeon: Ryker Sudbury K.   Assistants: none  Anesthesia: General endotracheal anesthesia  ASA Class: 2  Procedure Details  The patient was seen again in the Holding Room. The risks, benefits, complications, treatment options, and expected outcomes were discussed with the patient and/or family. The possibilities of reaction to medication, perforation of viscus, bleeding, recurrent infection, finding a normal appendix, the need for additional procedures, failure to diagnose a condition, and creating a complication requiring transfusion or operation were discussed. There was concurrence with the proposed plan and informed consent was obtained. The site of surgery was properly noted. The patient was taken to Operating Room, identified as Kemara Quigley and the procedure verified as Appendectomy. A Time Out was held and the above information confirmed.  The patient was placed in the supine position and general anesthesia was induced.  The abdomen was prepped and draped in a sterile fashion. A one centimeter supraumbilical incision was made.  Dissection was carried down to the fascia bluntly.  The fascia was incised vertically.  We entered the peritoneal cavity bluntly.  A pursestring suture was passed around the incision with a 0 Vicryl.  The Hasson cannula was introduced into the abdomen and the tails of the suture were used to hold the Hasson in place.   The pneumoperitoneum was then established maintaining a maximum pressure of 15 mmHg.  I inserted the laparoscope and we encountered a lot of omental adhesions below the umbilicus.  An additional 5 mm cannula was placed in the right  upper quadrant of the abdomen under direct visualization. A careful evaluation of the entire abdomen was carried out. The omental adhesions were lysed with the harmonic scalpel.  We were then able to place the left lower quadrant port.The patient was placed in Trendelenburg and left lateral decubitus position.  The scope was moved to the right upper quadrant port site. The cecum was mobilized medially.  The appendix was identified.  It was very thickened and inflamed with some purulent exudate, but no sign of perforation.  The appendix was carefully dissected. The appendix was was skeletonized with the harmonic scalpel.   The appendix was divided at its base using an endo-GIA stapler. No appendiceal stump was left in place. There was no evidence of bleeding, leakage, or complication after division of the appendix. Irrigation was also performed and irrigate suctioned from the abdomen as well.  The umbilical port site was closed with the purse string suture. There was no residual palpable fascial defect.  The trocar site skin wounds were closed with 4-0 Monocryl.  Instrument, sponge, and needle counts were correct at the conclusion of the case.   Findings: The appendix was found to be inflamed. There were signs of necrosis.  There was not perforation. There was not abscess formation.  Estimated Blood Loss:  Minimal         Drains: none         Specimens: appendix         Complications:  None; patient tolerated the procedure well.         Disposition: PACU - hemodynamically stable.         Condition: stable  Wilmon Arms. Corliss Skains, MD, [facility] Surgery  General/ Trauma Surgery  11/16/2012 9:01  AM

## 2012-11-17 LAB — URINE CULTURE

## 2012-11-17 LAB — CBC
MCH: 29.5 pg (ref 26.0–34.0)
MCHC: 33.5 g/dL (ref 30.0–36.0)
MCV: 88.1 fL (ref 78.0–100.0)
Platelets: 172 10*3/uL (ref 150–400)
RBC: 3.52 MIL/uL — ABNORMAL LOW (ref 3.87–5.11)

## 2012-11-17 MED ORDER — OLMESARTAN MEDOXOMIL 20 MG PO TABS: ORAL_TABLET | ORAL | Status: DC

## 2012-11-17 MED ORDER — SODIUM CHLORIDE 0.9 % IV BOLUS (SEPSIS)
500.0000 mL | Freq: Once | INTRAVENOUS | Status: AC
  Administered 2012-11-17: 500 mL via INTRAVENOUS

## 2012-11-17 MED ORDER — DILTIAZEM HCL ER BEADS 120 MG PO CP24: ORAL_CAPSULE | ORAL | Status: DC

## 2012-11-17 MED ORDER — POLYETHYLENE GLYCOL 3350 17 G PO PACK
17.0000 g | PACK | Freq: Every day | ORAL | Status: DC
  Administered 2012-11-17: 17 g via ORAL
  Filled 2012-11-17: qty 1

## 2012-11-17 MED ORDER — HYDROCODONE-ACETAMINOPHEN 5-325 MG PO TABS: 1.0000 | ORAL_TABLET | ORAL | Status: DC | PRN

## 2012-11-17 MED ORDER — ACETAMINOPHEN 325 MG PO TABS: ORAL_TABLET | ORAL | Status: DC

## 2012-11-17 MED ORDER — POLYETHYLENE GLYCOL 3350 17 G PO PACK: PACK | ORAL | Status: DC

## 2012-11-17 NOTE — Progress Notes (Signed)
1 Day Post-Op  Subjective: Pcp Not In System Dr. Lerry Liner, Dr. Christianne Borrow - Cardiology She is feeling better.  Some pain controlled with hydrocodone. She is on two BP medicines, Cardizem and Benicar.  It sounds like she has some history of tachycardia. She is eating a regular diet, still on O2 BP 85/50 She is asking for something for constipation. Objective: Vital signs in last 24 hours: Temp:  [97.1 F (36.2 C)-98.6 F (37 C)] 98 F (36.7 C) (08/24 0528) Pulse Rate:  [68-106] 68 (08/24 0528) Resp:  [15-18] 18 (08/24 0528) BP: (83-97)/(50-62) 85/50 mmHg (08/24 0528) SpO2:  [92 %-98 %] 98 % (08/24 0528) Last BM Date: 11/15/12 Diet: regular BP in the 80's, Labs better yesterday AM  Intake/Output from previous day: 08/23 0701 - 08/24 0700 In: 1500 [I.V.:1500] Out: 500 [Urine:500] Intake/Output this shift:    General appearance: alert, cooperative and no distress Resp: clear to auscultation bilaterally GI: soft, tender, incisions look good, tolerated regular breakfast.  Lab Results:   Recent Labs  11/15/12 2036 11/16/12 0515  WBC 12.7* 11.8*  HGB 12.3 10.7*  HCT 35.1* 31.5*  PLT 206 174    BMET  Recent Labs  11/15/12 2036 11/16/12 0515  NA 139 137  K 3.9 3.6  CL 102 105  CO2 28 23  GLUCOSE 170* 162*  BUN 22 15  CREATININE 0.84 0.76  CALCIUM 9.5 8.8   PT/INR No results found for this basename: LABPROT, INR,  in the last 72 hours   Recent Labs Lab 11/15/12 2036  AST 19  ALT 17  ALKPHOS 105  BILITOT 0.3  PROT 7.1  ALBUMIN 4.2     Lipase     Component Value Date/Time   LIPASE 15 11/15/2012 2036     Studies/Results: Ct Abdomen Pelvis Wo Contrast  11/16/2012   CLINICAL DATA:  Diffuse abdominal pain.  EXAM: CT ABDOMEN AND PELVIS WITHOUT CONTRAST  TECHNIQUE: Multidetector CT imaging of the abdomen and pelvis was performed following the standard protocol without intravenous contrast.  COMPARISON:  None.  FINDINGS: The examination is  limited by the lack of intravenous contrast. The appendix is markedly enlarged and contains an appendicolith. The appendix measures 12 mm in maximum diameter on image number 56. Periappendiceal soft tissue stranding is noted. Also noted are multiple mildly enlarged lymph nodes in the right lower abdominal mesentery.  No fluid collections or free peritoneal air. Unremarkable uterus, ovaries, urinary bladder, kidneys, liver, spleen and pancreas. Normal appearing adrenal glands. Cholecystectomy clips. Multiple colonic diverticula without evidence of diverticulitis. Minimal bibasilar lung atelectasis or scarring. Right L5 pars interarticularis defect with minimal anterolisthesis at the L5-S1 level. The left L5 pars is intact.  IMPRESSION: 1. Acute appendicitis without abscess. 2. Mild reactive mesenteric adenopathy in the right lower abdomen. 3. Right L5 spondylolysis with minimal grade 1 spondylolisthesis at the L5-S1 level. These results were called by telephone on 11/16/2012 at 0104 hours to Antony Madura, P.A., who verbally acknowledged these results.   Electronically Signed   By: Gordan Payment   On: 11/16/2012 01:04    Medications: . ciprofloxacin  400 mg Intravenous Q12H  . clindamycin (CLEOCIN) IV  600 mg Intravenous Q6H  . heparin  5,000 Units Subcutaneous Q8H  . pantoprazole  40 mg Oral Daily   Prior to Admission medications   Medication Sig Start Date End Date Taking? Authorizing Provider  diclofenac (VOLTAREN) 25 MG EC tablet Take 25 mg by mouth daily.   Yes Historical Provider,  MD  diltiazem (TIAZAC) 120 MG 24 hr capsule Take 120 mg by mouth daily.   Yes Historical Provider, MD  magnesium hydroxide (MILK OF MAGNESIA) 800 MG/5ML suspension Take 5 mLs by mouth daily as needed for constipation.   Yes Historical Provider, MD  nitroGLYCERIN (NITROSTAT) 0.4 MG SL tablet Place 0.4 mg under the tongue every 5 (five) minutes as needed.   Yes Historical Provider, MD  olmesartan (BENICAR) 20 MG tablet Take 20  mg by mouth daily.   Yes Historical Provider, MD  OVER THE COUNTER MEDICATION Take 30 mLs by mouth daily as needed (for heartburn).   Yes Historical Provider, MD  RABEprazole Sodium (ACIPHEX PO) Take 20 mg by mouth daily.   Yes Historical Provider, MD  tiZANidine (ZANAFLEX) 4 MG tablet Take 4 mg by mouth every 6 (six) hours as needed (for muscle cramps).   Yes Historical Provider, MD     Assessment/Plan Acute appendicitis without mention of peritonitis S/p laparoscopic appendectomy 11/16/12 Dr. Manus Rudd Hypertension GERD Prior cholecystectomy  Plan:  Fluid bolus, orthostatic Bp's, Miralax.  I would like to send her home, but not with a BP of 85 on two BP med.  I will look at her later today.     LOS: 2 days    Burnice Vassel 11/17/2012

## 2012-11-17 NOTE — Progress Notes (Signed)
I have seen and examined the patient and agree with the assessment and plans. Will repeat the CBC  Kassy Mcenroe A. Magnus Ivan  MD, FACS

## 2012-11-17 NOTE — Progress Notes (Signed)
Utilization review completed.  

## 2012-11-19 ENCOUNTER — Encounter (HOSPITAL_COMMUNITY): Payer: Self-pay | Admitting: General Surgery

## 2012-11-19 DIAGNOSIS — K3580 Unspecified acute appendicitis: Secondary | ICD-10-CM

## 2012-11-19 DIAGNOSIS — K219 Gastro-esophageal reflux disease without esophagitis: Secondary | ICD-10-CM

## 2012-11-19 DIAGNOSIS — I10 Essential (primary) hypertension: Secondary | ICD-10-CM | POA: Diagnosis present

## 2012-11-19 HISTORY — DX: Unspecified acute appendicitis: K35.80

## 2012-11-19 HISTORY — DX: Gastro-esophageal reflux disease without esophagitis: K21.9

## 2012-11-19 HISTORY — DX: Essential (primary) hypertension: I10

## 2012-11-19 NOTE — Discharge Summary (Signed)
Physician Discharge Summary  Patient ID: Leah Pruitt MRN: [id number] DOB/AGE: [birth year] 67 y.o.  Admit date: 11/15/2012 Discharge date: 11/17/2012  Admission Diagnoses:  Acute appendicitis Hypertension  GERD  Prior cholecystectomy  Discharge Diagnoses:  Same Post op Hypotension Active Problems:   Appendicitis, acute   Unspecified essential hypertension   GERD (gastroesophageal reflux disease)   PROCEDURES:  S/p laparoscopic appendectomy 11/16/12 Dr. Candice Camp Course:  Pt is a 67 yo F who presents with around 24 hours of lower abdominal pain that woke her from sleep. She had decreased appetite yesterday. She denies fevers/ chills. She has not had any nausea or vomiting. She had already had her gallbladder removed. She denies dysuria. She is bloated. No constipation or diarrhea.  She was taken to the OR and had surgery as noted above by Dr. Corliss Skains.  She tolerated it well.  They noted her BP was low after surgery, but she was stable, and she was on IV fluids so we just held her BP medicines.   The following AM her BM was still in the 85/50 range.  She was fine in bed, but we have her a fluid bolus and then got orthostatics and walked her later. She did fine, but we held her home BP medicines and instructed her and her husband to record BP at home and then call on Monday for follow up blood pressure treatment instructions.  She also has an appointment with Dr. Thomasene Lot later this week to evaluate reported tachycardia.  She was on Cardizem 120 mg PO and Benicar 20 mg daily on admit, both of these were held. She will follow up in the clinic for her appendicitis, as noted below.  BP 114/67  Pulse 62  Temp(Src) 98.4 F (36.9 C) (Oral)  Resp 20  Ht 5\' 3"  (1.6 m)  Wt 66.8 kg (147 lb 4.3 oz)  BMI 26.09 kg/m2  SpO2 100% Condition on d/c:  Improved.  Disposition: 01-Home or Self Care       Future Appointments Provider Department Dept Phone   12/10/2012 2:30 PM [facility] The [facility], [place] [phone]   01/02/2013 2:30 PM Peter M Swaz[place], MD [facility] Main [facility] [place]) [phone]       Medication List         acetaminophen 325 MG tablet  Commonly known as:  TYLENOL  Do not take more than 4000 mg of tylenol (acetaminophen) per day.     ACIPHEX PO  Take 20 mg by mouth daily.     diclofenac 25 MG EC tablet  Commonly known as:  VOLTAREN  Take 25 mg by mouth daily.     diltiazem 120 MG 24 hr capsule  Commonly known as:  TIAZAC  Do not take till you talk with your Medical doctor about her blood pressure     HYDROcodone-acetaminophen 5-325 MG per tablet  Commonly known as:  NORCO/VICODIN  Take 1-2 tablets by mouth every 4 (four) hours as needed.     magnesium hydroxide 800 MG/5ML suspension  Commonly known as:  MILK OF MAGNESIA  Take 5 mLs by mouth daily as needed for constipation.     nitroGLYCERIN 0.4 MG SL tablet  Commonly known as:  NITROSTAT  Place 0.4 mg under the tongue every 5 (five) minutes as needed.     olmesartan 20 MG tablet  Commonly known as:  BENICAR  Do not take this till you talk with your Medical doctor.  OVER THE COUNTER MEDICATION  Take 30 mLs by mouth daily as needed (for heartburn).     polyethylene glycol packet  Commonly known as:  MIRALAX / GLYCOLAX  You can also use Metamucil or Citrucel over the counter medicines for chronic constipation as needed.     tiZANidine 4 MG tablet  Commonly known as:  ZANAFLEX  Take 4 mg by mouth every 6 (six) hours as needed (for muscle cramps).       Follow-up Information   Follow up with Jearld Lesch, MD. (Call Monday and tell them about your surgery and that we held her blood pressure medicine becasue it was low.  Measure her blood pressure  tonight at supper, bedtime and in AM after she gets out of bed.  Call with this information to Dr. Mayford Knife.)    Specialty:  Specialist   Contact information:   [address] [place]  [place] [address] [phone]       Follow up with [facility] The Week [place] On 12/10/2012. (Be at the office at 2:00 PM for check in.  Your appointment is at 2:30 PM)    Contact information:   [address]   [place] [address] [phone]       Signed: Sherrie George 11/19/2012, 9:26 AM

## 2012-11-20 ENCOUNTER — Ambulatory Visit: Payer: Medicare Other | Admitting: Cardiovascular Disease

## 2012-12-10 ENCOUNTER — Encounter (INDEPENDENT_AMBULATORY_CARE_PROVIDER_SITE_OTHER): Payer: Self-pay

## 2012-12-10 ENCOUNTER — Ambulatory Visit (INDEPENDENT_AMBULATORY_CARE_PROVIDER_SITE_OTHER): Payer: Medicare Other | Admitting: General Surgery

## 2012-12-10 VITALS — BP 114/72 | HR 88 | Temp 99.1°F | Resp 15 | Ht 62.0 in | Wt 140.6 lb

## 2012-12-10 DIAGNOSIS — K352 Acute appendicitis with generalized peritonitis, without abscess: Secondary | ICD-10-CM

## 2012-12-10 NOTE — Progress Notes (Signed)
Leah Pruitt [birth year] [id number] 12/10/2012   History of Present Illness: Leah Pruitt is a  67 y.o. female who presents today status post lap appy by Dr. Corliss Skains.  Pathology reveals acute suppurative appendicitis.  The patient is tolerating a regular diet andhas good pain control.  She  is back to most normal activities. She is complaining of constipation.  She has chronic constipation as well  Physical Exam: Abd: soft, nontender, active bowel sounds, nondistended.  All incisions are well healed.  Impression: 1.  Acute appendicitis, s/p lap appy  Plan: She  is able to return to normal activities. She  may follow up on a prn basis.  She has been given names of OTC stool softeners and laxatives to assist her with her constipation.

## 2012-12-10 NOTE — Patient Instructions (Signed)
Over the Counter Stool softener: Miralax Colace Senokot  Laxatives: Dulcolax Magnesium Citrate (liquid)  Suppositories: Dulcolax Glycerin   Follow up as needed

## 2013-01-02 ENCOUNTER — Encounter: Payer: Self-pay | Admitting: Cardiology

## 2013-01-02 ENCOUNTER — Ambulatory Visit (INDEPENDENT_AMBULATORY_CARE_PROVIDER_SITE_OTHER): Payer: Medicare Other | Admitting: Cardiology

## 2013-01-02 VITALS — BP 130/80 | HR 100 | Ht 63.0 in | Wt 139.0 lb

## 2013-01-02 DIAGNOSIS — R079 Chest pain, unspecified: Secondary | ICD-10-CM | POA: Insufficient documentation

## 2013-01-02 DIAGNOSIS — R0789 Other chest pain: Secondary | ICD-10-CM | POA: Insufficient documentation

## 2013-01-02 DIAGNOSIS — R002 Palpitations: Secondary | ICD-10-CM | POA: Insufficient documentation

## 2013-01-02 NOTE — Progress Notes (Signed)
Leah Pruitt Date of Birth: 05-28-1945 Medical Record #[id number]  History of Present Illness: Mrs. Lesmeister is referred by Dr. Mayford Knife for evaluation of chest pain. She is a pleasant 67 year old [place] female who has a history of hypertension. She presents now with symptoms of discomfort in her chest. This may occur with exertion or at rest. She does get relief with sublingual nitroglycerin. She also complains of feeling tired and having periods where her heart races or flutters. She has minimal shortness of breath when she goes upstairs or is upset. Her husband reports that she is prediabetic. 2 months ago while visiting family in [place] she went to the hospital. Her husband reports her ECG showed "insufficiency". In August she was admitted to the hospital here with acute appendicitis and had an appendectomy. ECG at that time was normal. She has had prior cardiac evaluation with a LexiScan Myoview study in 2010 which was normal.  Current Outpatient Prescriptions on File Prior to Visit  Medication Sig Dispense Refill  . diclofenac (VOLTAREN) 25 MG EC tablet Take 25 mg by mouth daily.      Marland Kitchen diltiazem (TIAZAC) 120 MG 24 hr capsule Do not take till you talk with your Medical doctor about her blood pressure      . HYDROcodone-acetaminophen (NORCO/VICODIN) 5-325 MG per tablet Take 1-2 tablets by mouth every 4 (four) hours as needed.  40 tablet  0  . magnesium hydroxide (MILK OF MAGNESIA) 800 MG/5ML suspension Take 5 mLs by mouth daily as needed for constipation.      . nitroGLYCERIN (NITROSTAT) 0.4 MG SL tablet Place 0.4 mg under the tongue every 5 (five) minutes as needed.      Marland Kitchen olmesartan (BENICAR) 20 MG tablet Do not take this till you talk with your Medical doctor.      Marland Kitchen OVER THE COUNTER MEDICATION Take 30 mLs by mouth daily as needed (for heartburn).      . RABEprazole Sodium (ACIPHEX PO) Take 20 mg by mouth daily.      Marland Kitchen tiZANidine (ZANAFLEX) 4 MG tablet Take 4 mg by mouth every 6 (six)  hours as needed (for muscle cramps).       No current facility-administered medications on file prior to visit.    Allergies  Allergen Reactions  . Penicillins Other (See Comments)    Numbness of mouth and dry mouth  . Iodine Swelling    Past Medical History  Diagnosis Date  . HTN (hypertension)   . FH: cholecystectomy   . Back pain   . Osteoporosis   . Gastroesophageal reflux disease   . Appendicitis, acute 11/19/2012  . Unspecified essential hypertension 11/19/2012  . GERD (gastroesophageal reflux disease) 11/19/2012    Past Surgical History  Procedure Laterality Date  . Cesarean section    . Cholecystectomy    . Tonsillectomy    . Laparoscopic appendectomy N/A 11/16/2012    Procedure: APPENDECTOMY LAPAROSCOPIC;  Surgeon: Wilmon Arms. Corliss Skains, MD;  Location: [facility] OR;  Service: General;  Laterality: N/A;  . Laparoscopic lysis of adhesions Bilateral 11/16/2012    Procedure: LAPAROSCOPIC LYSIS OF ADHESIONS;  Surgeon: Wilmon Arms. Corliss Skains, MD;  Location: [facility] OR;  Service: General;  Laterality: Bilateral;    History  Smoking status  . Never Smoker   Smokeless tobacco  . Not on file    History  Alcohol Use No    Family History  Problem Relation Age of Onset  . Cancer Father 37    deceased  .  Stroke Mother 13    deceased  . Hypertension Sister     Review of Systems: The review of systems is positive for headache.  She reports extensive evaluation including neurology but no one can figure out why she has headaches. All other systems were reviewed and are negative.  Physical Exam: BP 130/80  Pulse 100  Ht 5\' 3"  (1.6 m)  Wt 139 lb (63.05 kg)  BMI 24.63 kg/m2 She is a short, overweight female in no acute distress. HEENT: Normal pupils equal round and reactive light accommodation. Sclera are clear. Oropharynx is clear. Neck: No JVD or bruits. No adenopathy or thyromegaly. Lungs: Clear Cardiovascular: Regular rate and rhythm. Normal S1 and S2. No gallop, murmur, or  click. Abdomen: Soft and nontender. No masses or bruits. Extremities: No cyanosis or edema. No tenderness to palpation. No Homans sign. Pulses are 2+. Skin: Warm and dry Neuro: Alert and oriented x3. Cranial nerves II through XII are intact. LABORATORY DATA: ECG was reviewed from August. This was a normal study.  Assessment / Plan: 1. Chest pain. History is difficult to obtain. She does get relief with sublingual nitroglycerin. We will obtain a LexiScan Myoview study to rule out ischemia. We'll also assess her LV function. Continue acid reflux therapy.  2. Palpitations. I recommended a 24-hour Holter monitor to evaluate.  3. Hypertension-controlled on diltiazem.  4. Iodine Allergy. Patient reports that she almost died with IV contrast in the past.  Cc: Lerry Liner MD

## 2013-01-02 NOTE — Patient Instructions (Signed)
We will have you wear a monitor for 24 hours.  We will schedule you for a nuclear stress test.  I will see you back after these studies.

## 2013-01-20 ENCOUNTER — Encounter: Payer: Self-pay | Admitting: Radiology

## 2013-01-20 ENCOUNTER — Encounter (INDEPENDENT_AMBULATORY_CARE_PROVIDER_SITE_OTHER): Payer: Medicare Other

## 2013-01-20 ENCOUNTER — Ambulatory Visit (HOSPITAL_COMMUNITY): Payer: Medicare Other | Attending: Cardiovascular Disease | Admitting: Radiology

## 2013-01-20 VITALS — BP 125/75 | HR 76 | Ht 63.0 in | Wt 137.0 lb

## 2013-01-20 DIAGNOSIS — R002 Palpitations: Secondary | ICD-10-CM

## 2013-01-20 DIAGNOSIS — R0602 Shortness of breath: Secondary | ICD-10-CM

## 2013-01-20 DIAGNOSIS — R0789 Other chest pain: Secondary | ICD-10-CM | POA: Insufficient documentation

## 2013-01-20 DIAGNOSIS — R079 Chest pain, unspecified: Secondary | ICD-10-CM

## 2013-01-20 DIAGNOSIS — R0609 Other forms of dyspnea: Secondary | ICD-10-CM | POA: Insufficient documentation

## 2013-01-20 DIAGNOSIS — I10 Essential (primary) hypertension: Secondary | ICD-10-CM | POA: Insufficient documentation

## 2013-01-20 DIAGNOSIS — E119 Type 2 diabetes mellitus without complications: Secondary | ICD-10-CM | POA: Insufficient documentation

## 2013-01-20 DIAGNOSIS — R0989 Other specified symptoms and signs involving the circulatory and respiratory systems: Secondary | ICD-10-CM | POA: Insufficient documentation

## 2013-01-20 MED ORDER — TECHNETIUM TC 99M SESTAMIBI GENERIC - CARDIOLITE
10.0000 | Freq: Once | INTRAVENOUS | Status: AC | PRN
  Administered 2013-01-20: 10 via INTRAVENOUS

## 2013-01-20 MED ORDER — REGADENOSON 0.4 MG/5ML IV SOLN
0.4000 mg | Freq: Once | INTRAVENOUS | Status: AC
  Administered 2013-01-20: 0.4 mg via INTRAVENOUS

## 2013-01-20 MED ORDER — AMINOPHYLLINE 25 MG/ML IV SOLN
75.0000 mg | Freq: Once | INTRAVENOUS | Status: AC
  Administered 2013-01-20: 75 mg via INTRAVENOUS

## 2013-01-20 MED ORDER — TECHNETIUM TC 99M SESTAMIBI GENERIC - CARDIOLITE
30.0000 | Freq: Once | INTRAVENOUS | Status: AC | PRN
  Administered 2013-01-20: 30 via INTRAVENOUS

## 2013-01-20 NOTE — Progress Notes (Signed)
[facility] SITE 3 NUCLEAR MED [address] [place], [place] [address] [phone]    Cardiology Nuclear Med Study  Leah Pruitt is a 67 y.o. female     MRN : [id number]     DOB: 1945-04-15  Procedure Date: 01/20/2013  Nuclear Med Background Indication for Stress Test:  Evaluation for Ischemia History:  3/10 DGL:OVFIEP, EF=68% Cardiac Risk Factors: Hypertension and NIDDM  Symptoms:  Chest Pain and Pressure with and without Exertion (last episode of chest discomfort was last week), DOE and Palpitations   Nuclear Pre-Procedure Caffeine/Decaff Intake:  None NPO After: 8:00pm   Lungs:  Clear. O2 Sat: 96% on room air. IV 0.9% NS with Angio Cath:  20g  IV Site: L Antecubital x 1, tolerated well IV Started by:  Irean Hong, RN  Chest Size (in):  36 Cup Size: B  Height: 5\' 3"  (1.6 m)  Weight:  137 lb (62.143 kg)  BMI:  Body mass index is 24.27 kg/(m^2). Tech Comments:  No medications today. Lena Interpreter present. The patient wants her husband to interpret. A release form was signed by the patient, and the [facility] interpreter left. Irean Hong, RN     Nuclear Med Study 1 or 2 day study: 1 day  Stress Test Type:  Eugenie Birks  Reading MD: Kristeen Miss, MD  Order Authorizing Provider:  Peter Swazil[place], MD  Resting Radionuclide: Technetium 54m Sestamibi  Resting Radionuclide Dose: 11.0 mCi   Stress Radionuclide:  Technetium 22m Sestamibi  Stress Radionuclide Dose: 33.0 mCi           Stress Protocol Rest HR: 76 Stress HR: 139  Rest BP: 125/75 Stress BP: 139/84  Exercise Time (min): n/a METS: n/a   Predicted Max HR: 154 bpm % Max HR: 90.26 bpm Rate Pressure Product: 32951   Dose of Adenosine (mg):  n/a Dose of Lexiscan: 0.4 mg  Dose of Atropine (mg): n/a Dose of Dobutamine: n/a mcg/kg/min (at max HR)  Stress Test Technologist: Smiley Houseman, CMA-N  Nuclear Technologist:  Dario Guardian, CNMT     Rest Procedure:  Myocardial perfusion imaging was  performed at rest 45 minutes following the intravenous administration of Technetium 66m Sestamibi.  Rest ECG: NSR - Normal EKG  Stress Procedure:  The patient received IV Lexiscan 0.4 mg over 15-seconds.  Technetium 8m Sestamibi injected at 30-seconds.  Patient c/o chest pain, nausea, leg weakness and headache with Lexiscan that was relieved with Aminophylline 75 mg.  Quantitative spect images were obtained after a 45 minute delay.  Stress ECG: No significant change from baseline ECG  QPS Raw Data Images:  There is some motion artifact.  Stress Images:  Normal homogeneous uptake in all areas of the myocardium. Rest Images:  Normal homogeneous uptake in all areas of the myocardium. Subtraction (SDS):  No evidence of ischemia. Transient Ischemic Dilatation (Normal <1.22):  0.91 Lung/Heart Ratio (Normal <0.45):  0.18  Quantitative Gated Spect Images QGS EDV:  59 ml QGS ESV:  23 ml  Impression Exercise Capacity:  Lexiscan with no exercise. BP Response:  Normal blood pressure response. Clinical Symptoms:  No significant symptoms noted. ECG Impression:  No significant ST segment change suggestive of ischemia. Comparison with Prior Nuclear Study: No images to compare  Overall Impression:  Normal stress nuclear study.  LV Ejection Fraction: 61%.  LV Wall Motion:  NL LV Function; NL Wall Motion.   Vesta Mixer, Montez Hageman., MD, [facility] 01/20/2013, 6:10 PM Office - [phone] Pager [phone]

## 2013-01-20 NOTE — Progress Notes (Signed)
Patient ID: Leah Pruitt, female   DOB: 11/03/1945, 67 y.o.   MRN: [id number] E cardio 24hr holter monitor applied

## 2013-01-24 ENCOUNTER — Telehealth: Payer: Self-pay | Admitting: Cardiology

## 2013-01-24 ENCOUNTER — Telehealth: Payer: Self-pay

## 2013-01-24 NOTE — Telephone Encounter (Signed)
Returned call to patient's daughter Celine Ahr results given.

## 2013-01-24 NOTE — Telephone Encounter (Signed)
New message  Patients daughter is calling in to get results of test, please call patient

## 2013-01-24 NOTE — Telephone Encounter (Signed)
Daughter Anselmo Pickler called Dr.Jordan reviewed 24 hour holter monitor which was normal.

## 2013-02-13 ENCOUNTER — Ambulatory Visit: Payer: Medicare Other | Admitting: Cardiology

## 2013-04-04 DIAGNOSIS — K219 Gastro-esophageal reflux disease without esophagitis: Secondary | ICD-10-CM | POA: Diagnosis not present

## 2013-04-04 DIAGNOSIS — E119 Type 2 diabetes mellitus without complications: Secondary | ICD-10-CM | POA: Diagnosis not present

## 2013-04-04 DIAGNOSIS — H6120 Impacted cerumen, unspecified ear: Secondary | ICD-10-CM | POA: Diagnosis not present

## 2013-04-04 DIAGNOSIS — I10 Essential (primary) hypertension: Secondary | ICD-10-CM | POA: Diagnosis not present

## 2013-04-04 DIAGNOSIS — R109 Unspecified abdominal pain: Secondary | ICD-10-CM | POA: Diagnosis not present

## 2013-04-04 DIAGNOSIS — R1084 Generalized abdominal pain: Secondary | ICD-10-CM | POA: Diagnosis not present

## 2013-04-04 DIAGNOSIS — E785 Hyperlipidemia, unspecified: Secondary | ICD-10-CM | POA: Diagnosis not present

## 2013-04-04 DIAGNOSIS — R079 Chest pain, unspecified: Secondary | ICD-10-CM | POA: Diagnosis not present

## 2013-04-07 DIAGNOSIS — K219 Gastro-esophageal reflux disease without esophagitis: Secondary | ICD-10-CM | POA: Diagnosis not present

## 2013-04-07 DIAGNOSIS — E785 Hyperlipidemia, unspecified: Secondary | ICD-10-CM | POA: Diagnosis not present

## 2013-04-07 DIAGNOSIS — E119 Type 2 diabetes mellitus without complications: Secondary | ICD-10-CM | POA: Diagnosis not present

## 2013-04-07 DIAGNOSIS — R109 Unspecified abdominal pain: Secondary | ICD-10-CM | POA: Diagnosis not present

## 2013-04-07 DIAGNOSIS — R079 Chest pain, unspecified: Secondary | ICD-10-CM | POA: Diagnosis not present

## 2013-12-03 DIAGNOSIS — Z23 Encounter for immunization: Secondary | ICD-10-CM | POA: Diagnosis not present

## 2013-12-05 DIAGNOSIS — H2589 Other age-related cataract: Secondary | ICD-10-CM | POA: Diagnosis not present

## 2013-12-19 DIAGNOSIS — E119 Type 2 diabetes mellitus without complications: Secondary | ICD-10-CM | POA: Diagnosis not present

## 2013-12-19 DIAGNOSIS — K219 Gastro-esophageal reflux disease without esophagitis: Secondary | ICD-10-CM | POA: Diagnosis not present

## 2013-12-19 DIAGNOSIS — H6120 Impacted cerumen, unspecified ear: Secondary | ICD-10-CM | POA: Diagnosis not present

## 2013-12-19 DIAGNOSIS — M129 Arthropathy, unspecified: Secondary | ICD-10-CM | POA: Diagnosis not present

## 2014-03-09 DIAGNOSIS — E119 Type 2 diabetes mellitus without complications: Secondary | ICD-10-CM | POA: Diagnosis not present

## 2014-03-09 DIAGNOSIS — R51 Headache: Secondary | ICD-10-CM | POA: Diagnosis not present

## 2014-03-09 DIAGNOSIS — G894 Chronic pain syndrome: Secondary | ICD-10-CM | POA: Diagnosis not present

## 2014-03-09 DIAGNOSIS — M1990 Unspecified osteoarthritis, unspecified site: Secondary | ICD-10-CM | POA: Diagnosis not present

## 2014-03-13 DIAGNOSIS — K838 Other specified diseases of biliary tract: Secondary | ICD-10-CM | POA: Diagnosis not present

## 2014-03-13 DIAGNOSIS — N281 Cyst of kidney, acquired: Secondary | ICD-10-CM | POA: Diagnosis not present

## 2014-03-16 DIAGNOSIS — G43711 Chronic migraine without aura, intractable, with status migrainosus: Secondary | ICD-10-CM | POA: Diagnosis not present

## 2014-03-17 ENCOUNTER — Other Ambulatory Visit: Payer: Self-pay | Admitting: Specialist

## 2014-03-17 DIAGNOSIS — E2839 Other primary ovarian failure: Secondary | ICD-10-CM

## 2014-03-24 ENCOUNTER — Other Ambulatory Visit: Payer: Self-pay | Admitting: Specialist

## 2014-03-24 ENCOUNTER — Ambulatory Visit
Admission: RE | Admit: 2014-03-24 | Discharge: 2014-03-24 | Disposition: A | Discharge status: Home / Self Care | Payer: Medicare Other | Source: Ambulatory Visit | Attending: Specialist | Admitting: Specialist

## 2014-03-24 DIAGNOSIS — E2839 Other primary ovarian failure: Secondary | ICD-10-CM

## 2014-03-24 DIAGNOSIS — Z1231 Encounter for screening mammogram for malignant neoplasm of breast: Secondary | ICD-10-CM | POA: Diagnosis not present

## 2014-03-24 DIAGNOSIS — M810 Age-related osteoporosis without current pathological fracture: Secondary | ICD-10-CM

## 2014-06-05 DIAGNOSIS — G894 Chronic pain syndrome: Secondary | ICD-10-CM | POA: Diagnosis not present

## 2014-06-05 DIAGNOSIS — E119 Type 2 diabetes mellitus without complications: Secondary | ICD-10-CM | POA: Diagnosis not present

## 2014-06-05 DIAGNOSIS — I10 Essential (primary) hypertension: Secondary | ICD-10-CM | POA: Diagnosis not present

## 2014-06-05 DIAGNOSIS — E785 Hyperlipidemia, unspecified: Secondary | ICD-10-CM | POA: Diagnosis not present

## 2014-06-05 DIAGNOSIS — M1990 Unspecified osteoarthritis, unspecified site: Secondary | ICD-10-CM | POA: Diagnosis not present

## 2014-07-02 ENCOUNTER — Ambulatory Visit: Payer: Medicare Other | Admitting: Neurology

## 2014-07-06 ENCOUNTER — Encounter: Payer: Self-pay | Admitting: Neurology

## 2014-08-18 ENCOUNTER — Ambulatory Visit: Payer: Medicare Other | Admitting: Neurology

## 2014-08-23 ENCOUNTER — Emergency Department (HOSPITAL_COMMUNITY): Payer: Medicare Other

## 2014-08-23 ENCOUNTER — Emergency Department (HOSPITAL_COMMUNITY)
Admission: EM | Admit: 2014-08-23 | Discharge: 2014-08-23 | Disposition: A | Discharge status: Home / Self Care | Payer: Medicare Other | Attending: Emergency Medicine | Admitting: Emergency Medicine

## 2014-08-23 ENCOUNTER — Encounter (HOSPITAL_COMMUNITY): Payer: Self-pay | Admitting: *Deleted

## 2014-08-23 DIAGNOSIS — Z880 Allergy status to penicillin: Secondary | ICD-10-CM | POA: Diagnosis not present

## 2014-08-23 DIAGNOSIS — I10 Essential (primary) hypertension: Secondary | ICD-10-CM | POA: Insufficient documentation

## 2014-08-23 DIAGNOSIS — Z79899 Other long term (current) drug therapy: Secondary | ICD-10-CM | POA: Diagnosis not present

## 2014-08-23 DIAGNOSIS — R9431 Abnormal electrocardiogram [ECG] [EKG]: Secondary | ICD-10-CM | POA: Diagnosis not present

## 2014-08-23 DIAGNOSIS — Z8739 Personal history of other diseases of the musculoskeletal system and connective tissue: Secondary | ICD-10-CM | POA: Diagnosis not present

## 2014-08-23 DIAGNOSIS — R200 Anesthesia of skin: Secondary | ICD-10-CM | POA: Insufficient documentation

## 2014-08-23 DIAGNOSIS — H538 Other visual disturbances: Secondary | ICD-10-CM | POA: Diagnosis not present

## 2014-08-23 DIAGNOSIS — R51 Headache: Secondary | ICD-10-CM | POA: Diagnosis not present

## 2014-08-23 DIAGNOSIS — K219 Gastro-esophageal reflux disease without esophagitis: Secondary | ICD-10-CM | POA: Insufficient documentation

## 2014-08-23 DIAGNOSIS — Z9049 Acquired absence of other specified parts of digestive tract: Secondary | ICD-10-CM | POA: Insufficient documentation

## 2014-08-23 DIAGNOSIS — Z791 Long term (current) use of non-steroidal anti-inflammatories (NSAID): Secondary | ICD-10-CM | POA: Diagnosis not present

## 2014-08-23 DIAGNOSIS — R519 Headache, unspecified: Secondary | ICD-10-CM

## 2014-08-23 LAB — COMPREHENSIVE METABOLIC PANEL
ALT: 14 U/L (ref 14–54)
AST: 18 U/L (ref 15–41)
Albumin: 4.1 g/dL (ref 3.5–5.0)
Alkaline Phosphatase: 105 U/L (ref 38–126)
Anion gap: 9 (ref 5–15)
BUN: 16 mg/dL (ref 6–20)
CHLORIDE: 103 mmol/L (ref 101–111)
CO2: 27 mmol/L (ref 22–32)
CREATININE: 0.82 mg/dL (ref 0.44–1.00)
Calcium: 9.4 mg/dL (ref 8.9–10.3)
GFR calc non Af Amer: >60 mL/min (ref >60)
Glucose, Bld: 94 mg/dL (ref 65–99)
Potassium: 3.8 mmol/L (ref 3.5–5.1)
Sodium: 139 mmol/L (ref 135–145)
TOTAL PROTEIN: 7 g/dL (ref 6.5–8.1)
Total Bilirubin: 0.5 mg/dL (ref 0.3–1.2)

## 2014-08-23 LAB — DIFFERENTIAL
Basophils Absolute: 0 10*3/uL (ref 0.0–0.1)
Basophils Relative: 1 % (ref 0–1)
EOS ABS: 0.1 10*3/uL (ref 0.0–0.7)
EOS PCT: 2 % (ref 0–5)
Lymphocytes Relative: 26 % (ref 12–46)
Lymphs Abs: 1.1 10*3/uL (ref 0.7–4.0)
MONOS PCT: 5 % (ref 3–12)
Monocytes Absolute: 0.2 10*3/uL (ref 0.1–1.0)
NEUTROS ABS: 2.8 10*3/uL (ref 1.7–7.7)
Neutrophils Relative %: 66 % (ref 43–77)

## 2014-08-23 LAB — CBC
HEMATOCRIT: 36.3 % (ref 36.0–46.0)
Hemoglobin: 11.9 g/dL — ABNORMAL LOW (ref 12.0–15.0)
MCH: 28.8 pg (ref 26.0–34.0)
MCHC: 32.8 g/dL (ref 30.0–36.0)
MCV: 87.9 fL (ref 78.0–100.0)
PLATELETS: 226 10*3/uL (ref 150–400)
RBC: 4.13 MIL/uL (ref 3.87–5.11)
RDW: 13.1 % (ref 11.5–15.5)
WBC: 4.2 10*3/uL (ref 4.0–10.5)

## 2014-08-23 LAB — I-STAT CHEM 8, ED
BUN: 18 mg/dL (ref 6–20)
CALCIUM ION: 1.27 mmol/L (ref 1.13–1.30)
CHLORIDE: 103 mmol/L (ref 101–111)
Creatinine, Ser: 0.8 mg/dL (ref 0.44–1.00)
Glucose, Bld: 92 mg/dL (ref 65–99)
HEMATOCRIT: 39 % (ref 36.0–46.0)
HEMOGLOBIN: 13.3 g/dL (ref 12.0–15.0)
Potassium: 3.9 mmol/L (ref 3.5–5.1)
Sodium: 143 mmol/L (ref 135–145)
TCO2: 25 mmol/L (ref 0–100)

## 2014-08-23 LAB — APTT: aPTT: 31 seconds (ref 24–37)

## 2014-08-23 LAB — ETHANOL: Alcohol, Ethyl (B): <5 mg/dL (ref <5)

## 2014-08-23 LAB — I-STAT TROPONIN, ED: Troponin i, poc: 0 ng/mL (ref 0.00–0.08)

## 2014-08-23 LAB — SEDIMENTATION RATE: Sed Rate: 22 mm/hr (ref 0–22)

## 2014-08-23 LAB — PROTIME-INR
INR: 1.13 (ref 0.00–1.49)
PROTHROMBIN TIME: 14.7 s (ref 11.6–15.2)

## 2014-08-23 MED ORDER — ACETAMINOPHEN 325 MG PO TABS
650.0000 mg | ORAL_TABLET | Freq: Once | ORAL | Status: DC
  Filled 2014-08-23: qty 2

## 2014-08-23 MED ORDER — GADOBENATE DIMEGLUMINE 529 MG/ML IV SOLN
13.0000 mL | Freq: Once | INTRAVENOUS | Status: AC | PRN
  Administered 2014-08-23: 13 mL via INTRAVENOUS

## 2014-08-23 NOTE — ED Notes (Signed)
Pt in MRI, will complete neuro check on return

## 2014-08-23 NOTE — ED Notes (Signed)
Patient returned from CT

## 2014-08-23 NOTE — ED Notes (Signed)
Neuro at bedside.

## 2014-08-23 NOTE — ED Notes (Signed)
Pt in with family c/o headache and left facial numbness for three days, normal movement noted to face, no droop, reports sensory deficit, no other deficits noted, pt also reports intermittent dizziness. States she took some medication for the headache this morning and it is better now. Symptoms have been constant since they started, worsening yesterday, no distress noted.

## 2014-08-23 NOTE — Consult Note (Signed)
Consult Reason for Consult: headache and left sided weakness Referring Physician: Dr Regenia Skeeter [facility] ED  CC: headache and left sided weakness  HPI: Leah Pruitt is an 69 y.o. female with history of HTN presenting with chronic history of left sided headache, left facial numbness and noted to have subtle left sided weakness on exam. Reports headache has been going on for months but has been getting progressively worse in the past month. She notes episodes of waking up with severe headache and having episodes of emesis. Denies any visual or speech deficits. She reports taking Norco as needed for her headache pain.  CT head imaging personally reviewed, shows no acute process.   Past Medical History  Diagnosis Date  . HTN (hypertension)   . FH: cholecystectomy   . Back pain   . Osteoporosis   . Gastroesophageal reflux disease   . Appendicitis, acute 11/19/2012  . Unspecified essential hypertension 11/19/2012  . GERD (gastroesophageal reflux disease) 11/19/2012    Past Surgical History  Procedure Laterality Date  . Cesarean section    . Cholecystectomy    . Tonsillectomy    . Laparoscopic appendectomy N/A 11/16/2012    Procedure: APPENDECTOMY LAPAROSCOPIC;  Surgeon: Imogene Burn. Georgette Dover, MD;  Location: [place];  Service: General;  Laterality: N/A;  . Laparoscopic lysis of adhesions Bilateral 11/16/2012    Procedure: LAPAROSCOPIC LYSIS OF ADHESIONS;  Surgeon: Imogene Burn. Georgette Dover, MD;  Location: [place] OR;  Service: General;  Laterality: Bilateral;    Family History  Problem Relation Age of Onset  . Cancer Father 52    deceased  . Stroke Mother 44    deceased  . Hypertension Sister     Social History:  reports that she has never smoked. She does not have any smokeless tobacco history on file. She reports that she does not drink alcohol or use illicit drugs.  Allergies  Allergen Reactions  . Penicillins Other (See Comments)    Numbness of mouth and dry mouth  . Iodine Swelling    Medications:  Scheduled:  ROS: Out of a complete 14 system review, the patient complains of only the following symptoms, and all other reviewed systems are negative. + headache  Physical Examination: Filed Vitals:   08/23/14 1043  BP: 136/69  Pulse: 89  Temp:   Resp: 14   Physical Exam  Constitutional: He appears well-developed and well-nourished.  Psych: Affect appropriate to situation Eyes: No scleral injection HENT: No OP obstrucion Head: Normocephalic.  Cardiovascular: Normal rate and regular rhythm.  Respiratory: Effort normal and breath sounds normal.  GI: Soft. Bowel sounds are normal. No distension. There is no tenderness.  Skin: WDI  Neurologic Examination Mental Status: Alert, oriented, thought content appropriate.  Speech fluent without evidence of aphasia.  Able to follow 3 step commands without difficulty. Cranial Nerves: II: funduscopic exam wnl bilaterally, visual fields grossly normal, pupils equal, round, reactive to light and accommodation III,IV, VI: ptosis not present, extra-ocular motions intact bilaterally V,VII: smile symmetric, facial light touch sensation normal bilaterally VIII: hearing normal bilaterally IX,X: gag reflex present XI: trapezius strength/neck flexion strength normal bilaterally XII: tongue strength normal  Motor: mild left pronator drift noted Right : Upper extremity    Left:     Upper extremity 5/5 deltoid       5-/5 deltoid 5/5 biceps      5-/5 biceps  5/5 triceps      5/5 triceps 5/5wrist flexion     5/5 wrist flexion 5/5 wrist extension  5/5 wrist extension 5/5 hand grip      5/5 hand grip  Lower extremity     Lower extremity 5/5 hip flexor      5-/5 hip flexor (pain related) 5/5 quadricep      5-/5 quadriceps  (pain related) 5/5 hamstrings     5/5 hamstrings 5/5 plantar flexion       5/5 plantar flexion 5/5 plantar extension     5/5 plantar extension Tone and bulk:normal tone throughout; no atrophy noted Sensory: Pinprick and  light touch intact throughout, bilaterally Deep Tendon Reflexes: 2+ and symmetric throughout Plantars: Right: downgoing   Left: downgoing Cerebellar: normal finger-to-nose,  and normal heel-to-shin test Gait: deferred  Laboratory Studies:   Basic Metabolic Panel:  Recent Labs Lab 08/23/14 0958 08/23/14 1009  NA 139 143  K 3.8 3.9  CL 103 103  CO2 27  --   GLUCOSE 94 92  BUN 16 18  CREATININE 0.82 0.80  CALCIUM 9.4  --     Liver Function Tests:  Recent Labs Lab 08/23/14 0958  AST 18  ALT 14  ALKPHOS 105  BILITOT 0.5  PROT 7.0  ALBUMIN 4.1   No results for input(s): LIPASE, AMYLASE in the last 168 hours. No results for input(s): AMMONIA in the last 168 hours.  CBC:  Recent Labs Lab 08/23/14 0958 08/23/14 1009  WBC 4.2  --   NEUTROABS 2.8  --   HGB 11.9* 13.3  HCT 36.3 39.0  MCV 87.9  --   PLT 226  --     Cardiac Enzymes: No results for input(s): CKTOTAL, CKMB, CKMBINDEX, TROPONINI in the last 168 hours.  BNP: Invalid input(s): POCBNP  CBG: No results for input(s): GLUCAP in the last 168 hours.  Microbiology: Results for orders placed or performed during the hospital encounter of 11/15/12  Urine culture     Status: None   Collection Time: 11/15/12  9:57 PM  Result Value Ref Range Status   Specimen Description URINE, CLEAN CATCH  Final   Special Requests NONE  Final   Culture  Setup Time   Final    11/16/2012 05:58 Performed at [place]   Final    5,000 COLONIES/ML Performed at [facility]   Culture   Final    INSIGNIFICANT GROWTH Performed at [facility]   Report Status 11/17/2012 FINAL  Final  Surgical pcr screen     Status: None   Collection Time: 11/16/12  5:03 AM  Result Value Ref Range Status   MRSA, PCR NEGATIVE NEGATIVE Final   Staphylococcus aureus NEGATIVE NEGATIVE Final    Comment:        The Xpert SA Assay (FDA approved for NASAL specimens in patients over 80 years of age), is  one component of a comprehensive surveillance program.  Test performance has been validated by [employer] for patients greater than or equal to 70 year old. It is not intended to diagnose infection nor to guide or monitor treatment.    Coagulation Studies:  Recent Labs  08/23/14 0958  LABPROT 14.7  INR 1.13    Urinalysis: No results for input(s): COLORURINE, LABSPEC, PHURINE, GLUCOSEU, HGBUR, BILIRUBINUR, KETONESUR, PROTEINUR, UROBILINOGEN, NITRITE, LEUKOCYTESUR in the last 168 hours.  Invalid input(s): APPERANCEUR  Lipid Panel:  No results found for: CHOL, TRIG, HDL, CHOLHDL, VLDL, LDLCALC  HgbA1C: No results found for: HGBA1C  Urine Drug Screen:  No results found for: LABOPIA, COCAINSCRNUR, [place], AMPHETMU, THCU,  LABBARB  Alcohol Level:  Recent Labs Lab 08/23/14 0958  ETH <5    Other results:  Imaging: Ct Head Wo Contrast  08/23/2014   CLINICAL DATA:  Headache, left facial numbness x3 days  EXAM: CT HEAD WITHOUT CONTRAST  TECHNIQUE: Contiguous axial images were obtained from the base of the skull through the vertex without intravenous contrast.  COMPARISON:  11/23/2010  FINDINGS: No evidence of parenchymal hemorrhage or extra-axial fluid collection. No mass lesion, mass effect, or midline shift.  No CT evidence of acute infarction.  Mild subcortical white matter and periventricular small vessel ischemic changes. Intracranial atherosclerosis.  Cerebral volume is within normal limits.  No ventriculomegaly.  The visualized paranasal sinuses are essentially clear. The mastoid air cells are unopacified.  No evidence of calvarial fracture.  IMPRESSION: No evidence of acute intracranial abnormality.  Mild small vessel ischemic changes.   Electronically Signed   By: Julian Hy M.D.   On: 08/23/2014 10:36     Assessment/Plan:  68y/o woman with history of hypertension presenting with concerns over chronic headache and left sided paresthesias and weakness. Unclear  etiology. With progressively worsening headache and neurological deficits will need to rule out central process such as mass or DST. Chronic norco usage can also be contributing to her headache symptoms. ESR wnl.   -MRI brain with and without and MRV brain -limit narcotic usage for treatment of headache -if MRI unremarkable then follow up with outpatient neurology for further headache management   Jim Like, DO [facility] [phone]  If 7pm- 7am, please page neurology on call as listed in [place]. 08/23/2014, 11:10 AM

## 2014-08-23 NOTE — ED Notes (Signed)
Pt returned from MRI, no distress noted.

## 2014-08-23 NOTE — ED Provider Notes (Signed)
CSN: [id number]     Arrival date & time 08/23/14  4098 History   First MD Initiated Contact with Patient 08/23/14 [phone]     Chief Complaint  Patient presents with  . Headache  . Numbness     (Consider location/radiation/quality/duration/timing/severity/associated sxs/prior Treatment) HPI  69 year old female presents with family for left-sided headache has been progressive and gradually worsening for the past 1 month. Patient and family are difficult historians but it seemed to be at least going on for 1 month. Patient states she gets a headache daily that involves her left scalp and face. When the headache comes on her face feels numb. She also has eye pain at times. There is no jaw claudication. The patient took oxycodone that she was given by her PCP and states the headache is improved. There've been no fevers or neck pain. Has not noticed any weakness. No extremity symptoms. Patient has been having blurry vision when the headache comes on but denies blurry vision now. Rates the headache currently is mild after having the oxycodone and denies any current numbness. Headache was much worse last night and recurred this morning which is why they presented today.  Past Medical History  Diagnosis Date  . HTN (hypertension)   . FH: cholecystectomy   . Back pain   . Osteoporosis   . Gastroesophageal reflux disease   . Appendicitis, acute 11/19/2012  . Unspecified essential hypertension 11/19/2012  . GERD (gastroesophageal reflux disease) 11/19/2012   Past Surgical History  Procedure Laterality Date  . Cesarean section    . Cholecystectomy    . Tonsillectomy    . Laparoscopic appendectomy N/A 11/16/2012    Procedure: APPENDECTOMY LAPAROSCOPIC;  Surgeon: Imogene Burn. Georgette Dover, MD;  Location: [place];  Service: General;  Laterality: N/A;  . Laparoscopic lysis of adhesions Bilateral 11/16/2012    Procedure: LAPAROSCOPIC LYSIS OF ADHESIONS;  Surgeon: Imogene Burn. Georgette Dover, MD;  Location: [place] [facility];  Service:  General;  Laterality: Bilateral;   Family History  Problem Relation Age of Onset  . Cancer Father 76    deceased  . Stroke Mother 72    deceased  . Hypertension Sister    History  Substance Use Topics  . Smoking status: Never Smoker   . Smokeless tobacco: Not on file  . Alcohol Use: No   OB History    No data available     Review of Systems  Constitutional: Negative for fever.  Eyes: Positive for visual disturbance. Negative for pain.  Neurological: Positive for numbness and headaches. Negative for weakness.  All other systems reviewed and are negative.     Allergies  Penicillins and Iodine  Home Medications   Prior to Admission medications   Medication Sig Start Date End Date Taking? Authorizing Provider  diclofenac (VOLTAREN) 25 MG EC tablet Take 25 mg by mouth daily.    Historical Provider, MD  diltiazem (TIAZAC) 120 MG 24 hr capsule Do not take till you talk with your Medical doctor about her blood pressure 11/17/12   Earnstine Regal, PA-C  HYDROcodone-acetaminophen (NORCO/VICODIN) 5-325 MG per tablet Take 1-2 tablets by mouth every 4 (four) hours as needed. 11/17/12   Earnstine Regal, PA-C  magnesium hydroxide (MILK OF MAGNESIA) 800 MG/5ML suspension Take 5 mLs by mouth daily as needed for constipation.    Historical Provider, MD  nitroGLYCERIN (NITROSTAT) 0.4 MG SL tablet Place 0.4 mg under the tongue every 5 (five) minutes as needed.    Historical Provider, MD  olmesartan (BENICAR) 20 MG  tablet Do not take this till you talk with your Medical doctor. 11/17/12   Earnstine Regal, PA-C  OVER THE COUNTER MEDICATION Take 30 mLs by mouth daily as needed (for heartburn).    Historical Provider, MD  RABEprazole Sodium (ACIPHEX PO) Take 20 mg by mouth daily.    Historical Provider, MD  tiZANidine (ZANAFLEX) 4 MG tablet Take 4 mg by mouth every 6 (six) hours as needed (for muscle cramps).    Historical Provider, MD   BP 140/82 mmHg  Pulse 84  Temp(Src) 97.9 F (36.6 C)  (Oral)  Resp 18  SpO2 100% Physical Exam  Constitutional: She is oriented to person, place, and time. She appears well-developed and well-nourished.  HENT:  Head: Normocephalic and atraumatic.  Right Ear: External ear normal.  Left Ear: External ear normal.  Nose: Nose normal.  Patient is tender diffusely over left scalp and left parietal skull. This includes temple but it is no more tender there than rest of left face/scalp  Eyes: EOM are normal. Pupils are equal, round, and reactive to light. Right eye exhibits no discharge. Left eye exhibits no discharge.  Neck: Normal range of motion. Neck supple.  Cardiovascular: Normal rate, regular rhythm and normal heart sounds.   Pulmonary/Chest: Effort normal and breath sounds normal.  Abdominal: Soft. She exhibits no distension. There is no tenderness.  Neurological: She is alert and oriented to person, place, and time.  CN 2-12 grossly intact including normal facial sensation. 5/5 strength in RUE, RLE. LUE slightly weaker on arm extension vs RUE, LLE slightly weaker on lifting off bed vs RLE. Slight pronator drift on left  Skin: Skin is warm and dry.  Vitals reviewed.   ED Course  Procedures (including critical care time) Labs Review Labs Reviewed  CBC - Abnormal; Notable for the following:    Hemoglobin 11.9 (*)    All other components within normal limits  ETHANOL  PROTIME-INR  APTT  DIFFERENTIAL  COMPREHENSIVE METABOLIC PANEL  SEDIMENTATION RATE  URINE RAPID DRUG SCREEN (HOSP PERFORMED) NOT AT [place], ROUTINE W REFLEX MICROSCOPIC (NOT AT [facility])  I-STAT CHEM 8, ED  I-STAT TROPOININ, ED    Imaging Review Ct Head Wo Contrast  08/23/2014   CLINICAL DATA:  Headache, left facial numbness x3 days  EXAM: CT HEAD WITHOUT CONTRAST  TECHNIQUE: Contiguous axial images were obtained from the base of the skull through the vertex without intravenous contrast.  COMPARISON:  11/23/2010  FINDINGS: No evidence of parenchymal hemorrhage  or extra-axial fluid collection. No mass lesion, mass effect, or midline shift.  No CT evidence of acute infarction.  Mild subcortical white matter and periventricular small vessel ischemic changes. Intracranial atherosclerosis.  Cerebral volume is within normal limits.  No ventriculomegaly.  The visualized paranasal sinuses are essentially clear. The mastoid air cells are unopacified.  No evidence of calvarial fracture.  IMPRESSION: No evidence of acute intracranial abnormality.  Mild small vessel ischemic changes.   Electronically Signed   By: Julian Hy M.D.   On: 08/23/2014 10:36   Mr Jeri Cos FH Contrast  08/23/2014   CLINICAL DATA:  Progressively worsening LEFT-sided headache and facial numbness. LEFT-sided weakness.  EXAM: MRI HEAD WITHOUT AND WITH CONTRAST  MRV HEAD WITHOUT CONTRAST  TECHNIQUE: Multiplanar, multiecho pulse sequences of the brain and surrounding structures were obtained without and with intravenous contrast. Angiographic images of the intracranial venous structures were obtained using MRV technique without intravenous contrast.  CONTRAST:  44m MULTIHANCE GADOBENATE DIMEGLUMINE 529 MG/ML  IV SOLN  COMPARISON:  CT head 08/23/2014.  FINDINGS: MR BRAIN: No evidence for acute infarction, hemorrhage, mass lesion, hydrocephalus, or extra-axial fluid. Normal for age cerebral volume. Mild subcortical and periventricular T2 and FLAIR hyperintensities, likely chronic microvascular ischemic change. Partial empty sella. Mild cervical spondylosis. No tonsillar herniation.  Flow voids are maintained throughout the carotid, basilar, and vertebral arteries. There are no areas of chronic hemorrhage.  Post infusion, no abnormal enhancement of the brain or meninges. Major dural venous sinuses are patent.  Visualized calvarium, skull base, and upper cervical osseous structures unremarkable. Scalp and extracranial soft tissues, orbits, sinuses, and mastoids show no acute process.  MRV: The superior  sagittal sinus is widely patent. The RIGHT transverse sinus is dominant but the LEFT transverse sinus is patent. Dominant RIGHT sigmoid and RIGHT IJ. Small but patent LEFT sigmoid and LEFT IJ. No cortical venous thrombosis. Deep venous system is patent.  IMPRESSION: No acute intracranial findings. No abnormal postcontrast enhancement. No evidence for venous sinus thrombosis.   Electronically Signed   By: Rolla Flatten M.D.   On: 08/23/2014 13:24   Mr Mrv Head Wo Cm  08/23/2014   CLINICAL DATA:  Progressively worsening LEFT-sided headache and facial numbness. LEFT-sided weakness.  EXAM: MRI HEAD WITHOUT AND WITH CONTRAST  MRV HEAD WITHOUT CONTRAST  TECHNIQUE: Multiplanar, multiecho pulse sequences of the brain and surrounding structures were obtained without and with intravenous contrast. Angiographic images of the intracranial venous structures were obtained using MRV technique without intravenous contrast.  CONTRAST:  31m MULTIHANCE GADOBENATE DIMEGLUMINE 529 MG/ML IV SOLN  COMPARISON:  CT head 08/23/2014.  FINDINGS: MR BRAIN: No evidence for acute infarction, hemorrhage, mass lesion, hydrocephalus, or extra-axial fluid. Normal for age cerebral volume. Mild subcortical and periventricular T2 and FLAIR hyperintensities, likely chronic microvascular ischemic change. Partial empty sella. Mild cervical spondylosis. No tonsillar herniation.  Flow voids are maintained throughout the carotid, basilar, and vertebral arteries. There are no areas of chronic hemorrhage.  Post infusion, no abnormal enhancement of the brain or meninges. Major dural venous sinuses are patent.  Visualized calvarium, skull base, and upper cervical osseous structures unremarkable. Scalp and extracranial soft tissues, orbits, sinuses, and mastoids show no acute process.  MRV: The superior sagittal sinus is widely patent. The RIGHT transverse sinus is dominant but the LEFT transverse sinus is patent. Dominant RIGHT sigmoid and RIGHT IJ. Small but  patent LEFT sigmoid and LEFT IJ. No cortical venous thrombosis. Deep venous system is patent.  IMPRESSION: No acute intracranial findings. No abnormal postcontrast enhancement. No evidence for venous sinus thrombosis.   Electronically Signed   By: JRolla FlattenM.D.   On: 08/23/2014 13:24     EKG Interpretation   Date/Time:  Sunday Aug 23 2014 09:53:49 EDT Ventricular Rate:  91 PR Interval:  148 QRS Duration: 85 QT Interval:  332 QTC Calculation: 408 R Axis:   38 Text Interpretation:  Age not entered, assumed to be  69years old for  purpose of ECG interpretation Sinus rhythm no significant change since  2014 Confirmed by Riddick Nuon  MD, [place]([id number] on 08/23/2014 9:58:58 AM      MDM   Final diagnoses:  Left-sided headache    Discussed patient's case with the neurologist on call, Dr. SJanann Colonel who recommends MRI/MRV given her headache symptoms and neuro findings. This is negative. He thinks her mild weakness is more arthritis related given she does have some intermittent pain at her shoulder and hip. She currently does not have a  headache at all. At this point her workup was otherwise unremarkable, will recommend neurology follow-up for headache management and discussed return precautions. She does have tenderness over her temple, but has tenderness otherwise and has a low ESR. Without current pain I have very low suspicion for a temporal arteritis.    Sherwood Gambler, MD 08/23/14 [phone]

## 2014-09-11 ENCOUNTER — Ambulatory Visit: Payer: Medicare Other | Admitting: Neurology

## 2014-09-18 DIAGNOSIS — R51 Headache: Secondary | ICD-10-CM | POA: Diagnosis not present

## 2014-09-18 DIAGNOSIS — M1990 Unspecified osteoarthritis, unspecified site: Secondary | ICD-10-CM | POA: Diagnosis not present

## 2014-09-18 DIAGNOSIS — K219 Gastro-esophageal reflux disease without esophagitis: Secondary | ICD-10-CM | POA: Diagnosis not present

## 2014-09-18 DIAGNOSIS — I10 Essential (primary) hypertension: Secondary | ICD-10-CM | POA: Diagnosis not present

## 2014-09-21 ENCOUNTER — Other Ambulatory Visit: Payer: Self-pay

## 2014-10-05 ENCOUNTER — Ambulatory Visit (INDEPENDENT_AMBULATORY_CARE_PROVIDER_SITE_OTHER): Payer: Medicare Other | Admitting: Neurology

## 2014-10-05 DIAGNOSIS — R51 Headache: Secondary | ICD-10-CM

## 2014-10-06 NOTE — Progress Notes (Signed)
Came without interpretor.

## 2014-10-07 ENCOUNTER — Encounter: Payer: Self-pay | Admitting: *Deleted

## 2014-10-07 ENCOUNTER — Ambulatory Visit: Payer: Medicare Other | Admitting: Neurology

## 2014-11-28 DIAGNOSIS — Z23 Encounter for immunization: Secondary | ICD-10-CM | POA: Diagnosis not present

## 2014-12-25 DIAGNOSIS — E119 Type 2 diabetes mellitus without complications: Secondary | ICD-10-CM | POA: Diagnosis not present

## 2014-12-25 DIAGNOSIS — M1990 Unspecified osteoarthritis, unspecified site: Secondary | ICD-10-CM | POA: Diagnosis not present

## 2014-12-25 DIAGNOSIS — R51 Headache: Secondary | ICD-10-CM | POA: Diagnosis not present

## 2014-12-25 DIAGNOSIS — E785 Hyperlipidemia, unspecified: Secondary | ICD-10-CM | POA: Diagnosis not present

## 2014-12-25 DIAGNOSIS — I10 Essential (primary) hypertension: Secondary | ICD-10-CM | POA: Diagnosis not present

## 2014-12-25 DIAGNOSIS — R5382 Chronic fatigue, unspecified: Secondary | ICD-10-CM | POA: Diagnosis not present

## 2015-01-26 DIAGNOSIS — M79641 Pain in right hand: Secondary | ICD-10-CM | POA: Diagnosis not present

## 2015-01-26 DIAGNOSIS — M79602 Pain in left arm: Secondary | ICD-10-CM | POA: Diagnosis not present

## 2015-01-26 DIAGNOSIS — M79642 Pain in left hand: Secondary | ICD-10-CM | POA: Diagnosis not present

## 2015-01-26 DIAGNOSIS — M79601 Pain in right arm: Secondary | ICD-10-CM | POA: Diagnosis not present

## 2015-05-01 DIAGNOSIS — R1030 Lower abdominal pain, unspecified: Secondary | ICD-10-CM | POA: Diagnosis not present

## 2015-05-01 DIAGNOSIS — M1990 Unspecified osteoarthritis, unspecified site: Secondary | ICD-10-CM | POA: Diagnosis not present

## 2015-05-01 DIAGNOSIS — R51 Headache: Secondary | ICD-10-CM | POA: Diagnosis not present

## 2015-05-01 DIAGNOSIS — K219 Gastro-esophageal reflux disease without esophagitis: Secondary | ICD-10-CM | POA: Diagnosis not present

## 2015-05-10 DIAGNOSIS — M810 Age-related osteoporosis without current pathological fracture: Secondary | ICD-10-CM | POA: Diagnosis not present

## 2015-05-10 DIAGNOSIS — Z9049 Acquired absence of other specified parts of digestive tract: Secondary | ICD-10-CM | POA: Diagnosis not present

## 2015-05-10 DIAGNOSIS — K760 Fatty (change of) liver, not elsewhere classified: Secondary | ICD-10-CM | POA: Diagnosis not present

## 2015-05-10 DIAGNOSIS — R1010 Upper abdominal pain, unspecified: Secondary | ICD-10-CM | POA: Diagnosis not present

## 2015-05-10 DIAGNOSIS — Z1231 Encounter for screening mammogram for malignant neoplasm of breast: Secondary | ICD-10-CM | POA: Diagnosis not present

## 2015-05-18 DIAGNOSIS — M810 Age-related osteoporosis without current pathological fracture: Secondary | ICD-10-CM | POA: Insufficient documentation

## 2015-05-18 DIAGNOSIS — G43719 Chronic migraine without aura, intractable, without status migrainosus: Secondary | ICD-10-CM | POA: Diagnosis not present

## 2015-05-18 DIAGNOSIS — G43119 Migraine with aura, intractable, without status migrainosus: Secondary | ICD-10-CM | POA: Insufficient documentation

## 2015-06-26 ENCOUNTER — Emergency Department (HOSPITAL_COMMUNITY): Payer: Medicare Other

## 2015-06-26 ENCOUNTER — Encounter (HOSPITAL_COMMUNITY): Payer: Self-pay | Admitting: *Deleted

## 2015-06-26 ENCOUNTER — Emergency Department (HOSPITAL_COMMUNITY)
Admission: EM | Admit: 2015-06-26 | Discharge: 2015-06-27 | Disposition: A | Discharge status: Home / Self Care | Payer: Medicare Other | Attending: Emergency Medicine | Admitting: Emergency Medicine

## 2015-06-26 DIAGNOSIS — Z791 Long term (current) use of non-steroidal anti-inflammatories (NSAID): Secondary | ICD-10-CM | POA: Diagnosis not present

## 2015-06-26 DIAGNOSIS — Z7984 Long term (current) use of oral hypoglycemic drugs: Secondary | ICD-10-CM | POA: Insufficient documentation

## 2015-06-26 DIAGNOSIS — E119 Type 2 diabetes mellitus without complications: Secondary | ICD-10-CM | POA: Diagnosis not present

## 2015-06-26 DIAGNOSIS — Z8572 Personal history of non-Hodgkin lymphomas: Secondary | ICD-10-CM | POA: Insufficient documentation

## 2015-06-26 DIAGNOSIS — K219 Gastro-esophageal reflux disease without esophagitis: Secondary | ICD-10-CM | POA: Diagnosis not present

## 2015-06-26 DIAGNOSIS — I10 Essential (primary) hypertension: Secondary | ICD-10-CM | POA: Diagnosis not present

## 2015-06-26 DIAGNOSIS — K639 Disease of intestine, unspecified: Secondary | ICD-10-CM | POA: Diagnosis not present

## 2015-06-26 DIAGNOSIS — R109 Unspecified abdominal pain: Secondary | ICD-10-CM

## 2015-06-26 DIAGNOSIS — Z9049 Acquired absence of other specified parts of digestive tract: Secondary | ICD-10-CM | POA: Diagnosis not present

## 2015-06-26 DIAGNOSIS — Z880 Allergy status to penicillin: Secondary | ICD-10-CM | POA: Insufficient documentation

## 2015-06-26 DIAGNOSIS — R1031 Right lower quadrant pain: Secondary | ICD-10-CM | POA: Diagnosis not present

## 2015-06-26 DIAGNOSIS — Z792 Long term (current) use of antibiotics: Secondary | ICD-10-CM | POA: Insufficient documentation

## 2015-06-26 DIAGNOSIS — Z79899 Other long term (current) drug therapy: Secondary | ICD-10-CM | POA: Diagnosis not present

## 2015-06-26 DIAGNOSIS — K5732 Diverticulitis of large intestine without perforation or abscess without bleeding: Secondary | ICD-10-CM | POA: Insufficient documentation

## 2015-06-26 DIAGNOSIS — Z8739 Personal history of other diseases of the musculoskeletal system and connective tissue: Secondary | ICD-10-CM | POA: Insufficient documentation

## 2015-06-26 DIAGNOSIS — R1032 Left lower quadrant pain: Secondary | ICD-10-CM

## 2015-06-26 DIAGNOSIS — K5730 Diverticulosis of large intestine without perforation or abscess without bleeding: Secondary | ICD-10-CM | POA: Diagnosis not present

## 2015-06-26 LAB — COMPREHENSIVE METABOLIC PANEL
ALK PHOS: 103 U/L (ref 38–126)
ALT: 14 U/L (ref 14–54)
ANION GAP: 8 (ref 5–15)
AST: 18 U/L (ref 15–41)
Albumin: 3.7 g/dL (ref 3.5–5.0)
BUN: 13 mg/dL (ref 6–20)
CALCIUM: 8.9 mg/dL (ref 8.9–10.3)
CHLORIDE: 102 mmol/L (ref 101–111)
CO2: 27 mmol/L (ref 22–32)
CREATININE: 0.8 mg/dL (ref 0.44–1.00)
GFR calc non Af Amer: >60 mL/min (ref >60)
Glucose, Bld: 136 mg/dL — ABNORMAL HIGH (ref 65–99)
Potassium: 3.8 mmol/L (ref 3.5–5.1)
SODIUM: 137 mmol/L (ref 135–145)
Total Bilirubin: 0.5 mg/dL (ref 0.3–1.2)
Total Protein: 6.5 g/dL (ref 6.5–8.1)

## 2015-06-26 LAB — URINE MICROSCOPIC-ADD ON: BACTERIA UA: NONE SEEN

## 2015-06-26 LAB — CBC
HCT: 35.6 % — ABNORMAL LOW (ref 36.0–46.0)
Hemoglobin: 12 g/dL (ref 12.0–15.0)
MCH: 29.8 pg (ref 26.0–34.0)
MCHC: 33.7 g/dL (ref 30.0–36.0)
MCV: 88.3 fL (ref 78.0–100.0)
Platelets: 216 10*3/uL (ref 150–400)
RBC: 4.03 MIL/uL (ref 3.87–5.11)
RDW: 12.6 % (ref 11.5–15.5)
WBC: 9.4 10*3/uL (ref 4.0–10.5)

## 2015-06-26 LAB — URINALYSIS, ROUTINE W REFLEX MICROSCOPIC
BILIRUBIN URINE: NEGATIVE
GLUCOSE, UA: NEGATIVE mg/dL
HGB URINE DIPSTICK: NEGATIVE
KETONES UR: NEGATIVE mg/dL
Leukocytes, UA: NEGATIVE
NITRITE: NEGATIVE
PH: 8.5 — AB (ref 5.0–8.0)
Protein, ur: NEGATIVE mg/dL
Specific Gravity, Urine: 1.021 (ref 1.005–1.030)

## 2015-06-26 LAB — LIPASE, BLOOD: LIPASE: 20 U/L (ref 11–51)

## 2015-06-26 IMAGING — CT CT ABD-PELV W/O CM
2 of 4 series · 16 of 46 positions shown, 18 images · non-contrast
Comparison: CT abdomen dated [DATE].

CLINICAL DATA: Abdominal pain, periumbilical pain radiating to left
lower quadrant.

History of cholecystectomy, appendectomy and diabetes.
EXAM:
CT ABDOMEN AND PELVIS WITHOUT CONTRAST
TECHNIQUE: Multidetector CT imaging of the abdomen and pelvis was performed
following the standard protocol without IV contrast.

[Series 2: abd/ pelvis 5.0 i30f 1 · axial · 0.73mm/px · z∈[+888,+1258]mm · 13 of 82 slices shown, 15 images]
[im 4/82  soft-tissue]
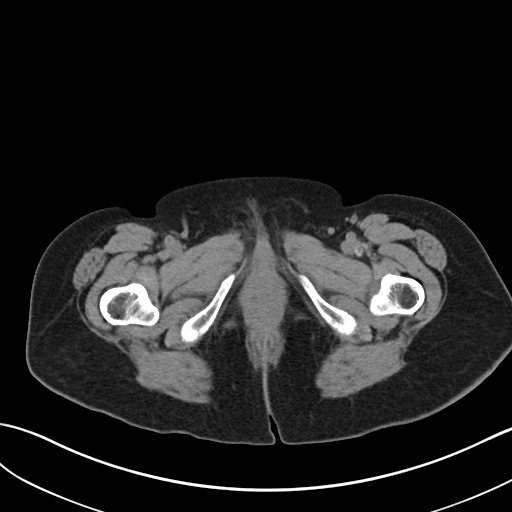
[im 4/82  bone]
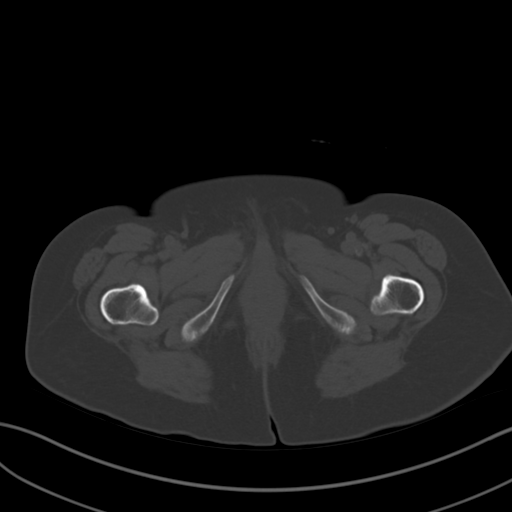
[im 11/82  soft-tissue]
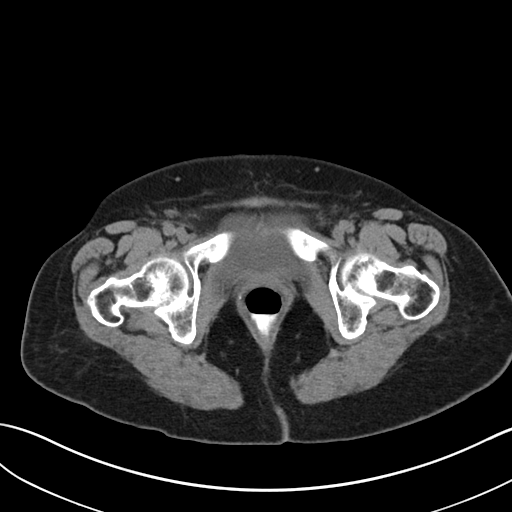
[im 17/82  soft-tissue]
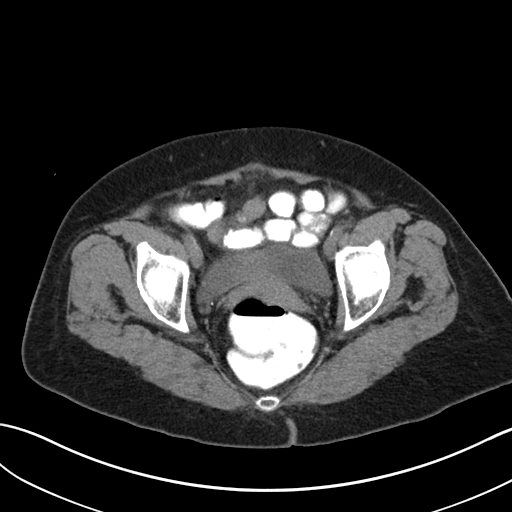
[im 24/82  soft-tissue]
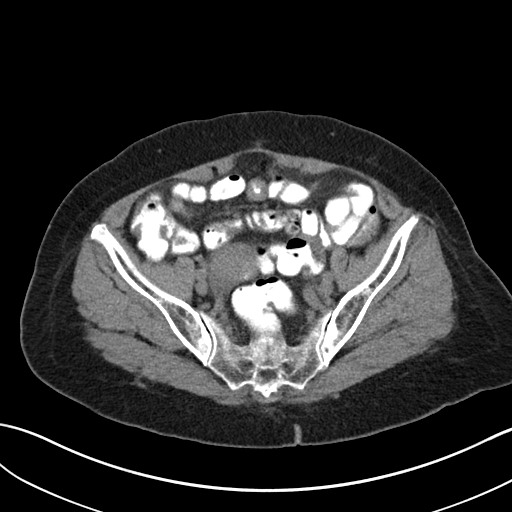
[im 28/82  soft-tissue]
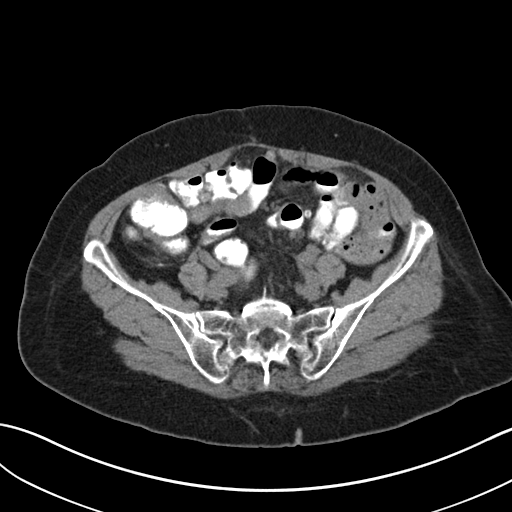
[im 34/82  soft-tissue]
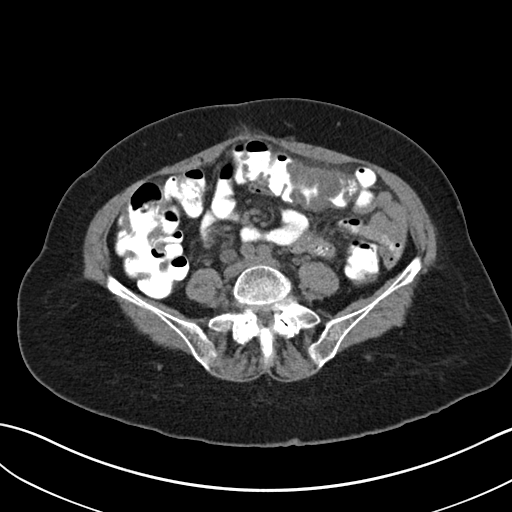
[im 41/82  soft-tissue]
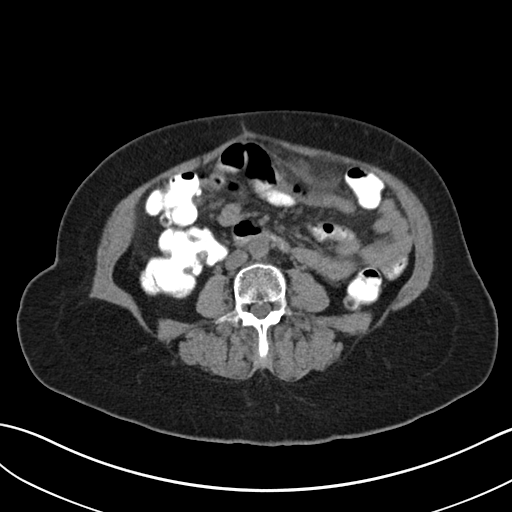
[im 48/82  soft-tissue]
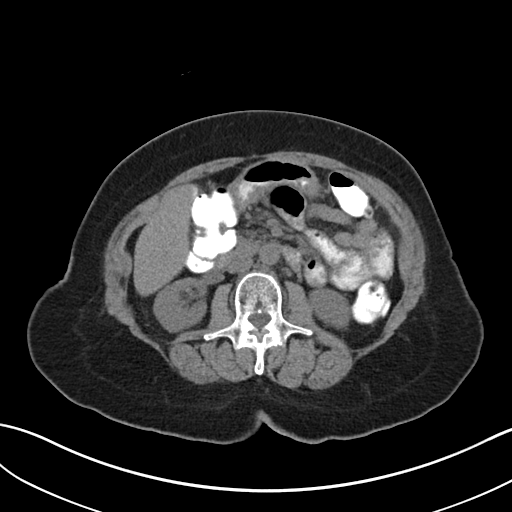
[im 55/82  soft-tissue]
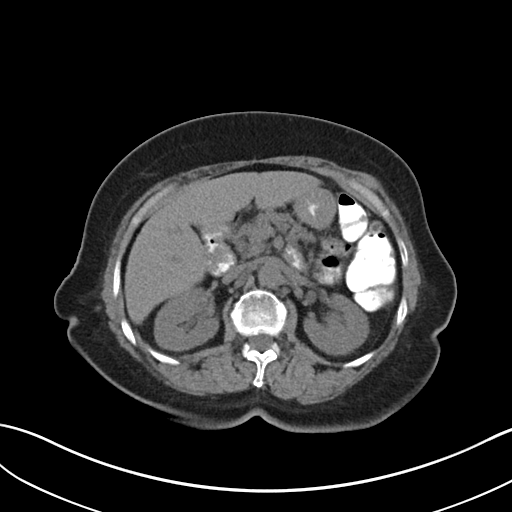
[im 55/82  bone]
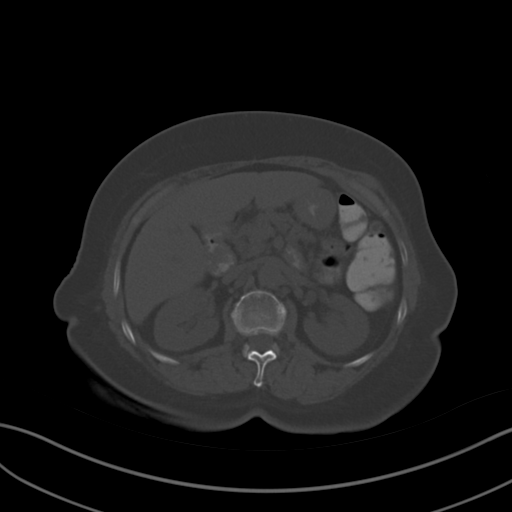
[im 58/82  soft-tissue]
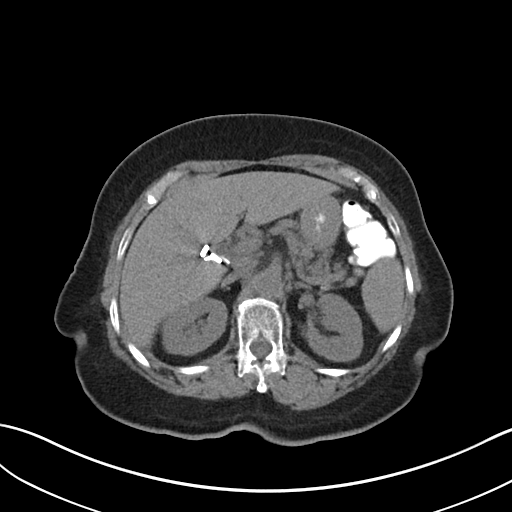
[im 65/82  soft-tissue]
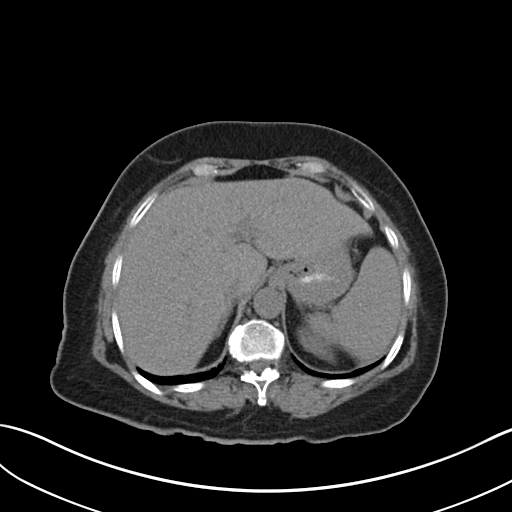
[im 71/82  soft-tissue]
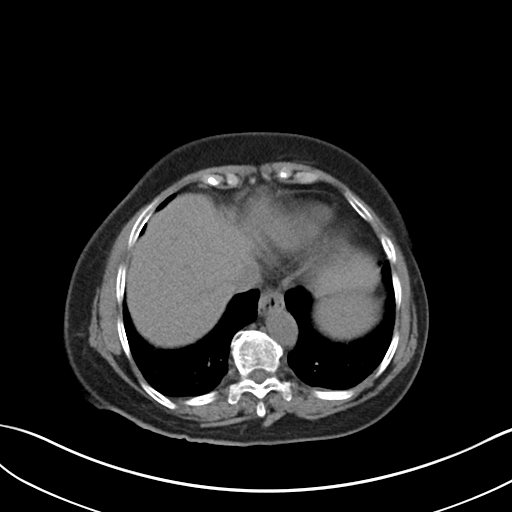
[im 78/82  soft-tissue]
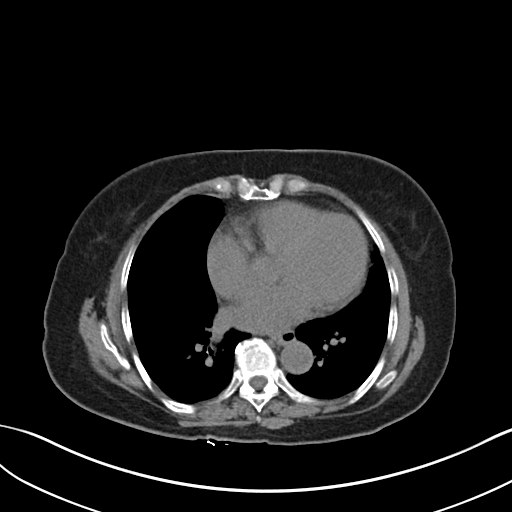

[Series 5: cor st · coronal · 0.63mm/px · 3 of 78 slices shown]
[im 26/78  soft-tissue]
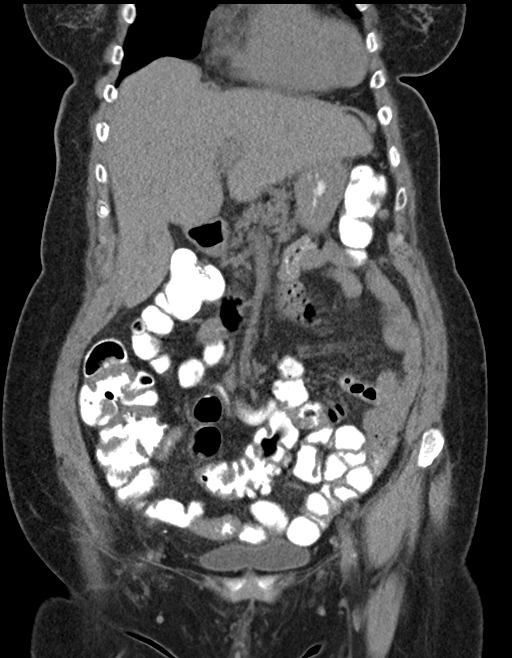
[im 35/78  soft-tissue]
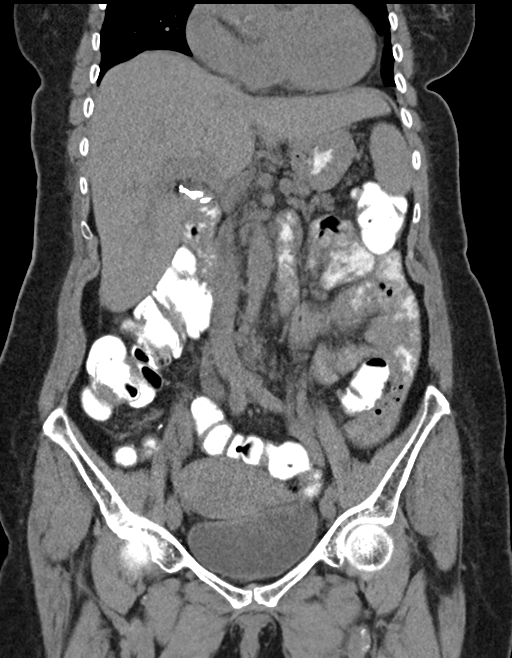
[im 43/78  soft-tissue]
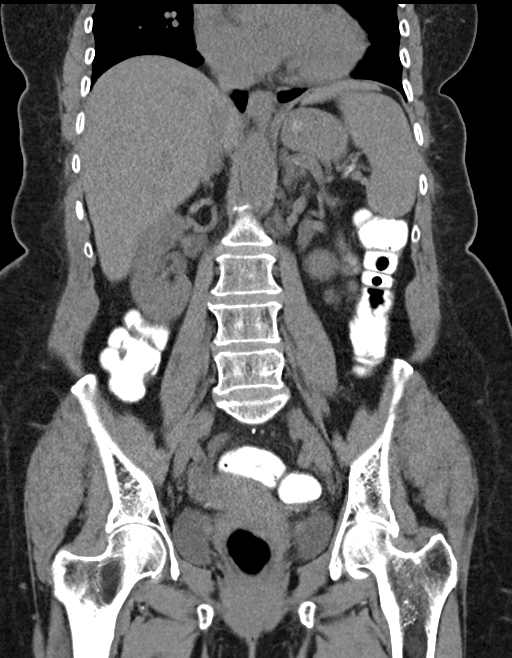

[16 of 46 positions shown; findings below may reference images not displayed]

FINDINGS: Lower chest:  No acute findings.

Hepatobiliary: Status post cholecystectomy. No mass visualized
within the liver on this unenhanced exam.

Pancreas: Partially infiltrated with fat but otherwise unremarkable.

Spleen: Within normal limits in size.

Adrenals/Urinary Tract: Adrenal glands appear normal. Kidneys are
unremarkable without stone or hydronephrosis. No ureteral or bladder
calculi identified.

Stomach/Bowel: Distal transverse colon, with extension into the
surrounding paracolic fat. Remainder of the bowel is unremarkable.
No dilated large or small bowel loops. Patient is status post
appendectomy. Stomach appears normal.

Vascular/Lymphatic: No enlarged lymph nodes seen. Abdominal aorta is
normal in caliber.

Reproductive: Unremarkable.

Other: No abscess collection or free intraperitoneal air.

Musculoskeletal: No acute or suspicious osseous lesion. Degenerative
changes of the thoracolumbar spine, mild to moderate in degree.
Superficial soft tissues are unremarkable.
IMPRESSION: 1. Masslike thickening of the walls of the distal transverse colon.
While this could represent a focal inflammatory or infectious
colitis, possibly even diverticulitis (several diverticula are seen
in the area), it is most suspicious for colon cancer. No associated
bowel obstruction.
2. Remainder of the abdomen and pelvis CT is unremarkable, as
detailed above.
Ordering physician was not immediately available for verbal report.

## 2015-06-26 MED ORDER — PANTOPRAZOLE SODIUM 40 MG IV SOLR
40.0000 mg | Freq: Once | INTRAVENOUS | Status: AC
  Administered 2015-06-26: 40 mg via INTRAVENOUS
  Filled 2015-06-26: qty 40

## 2015-06-26 MED ORDER — METRONIDAZOLE IN NACL 5-0.79 MG/ML-% IV SOLN
500.0000 mg | Freq: Once | INTRAVENOUS | Status: AC
  Administered 2015-06-26: 500 mg via INTRAVENOUS
  Filled 2015-06-26: qty 100

## 2015-06-26 MED ORDER — BARIUM SULFATE 2.1 % PO SUSP
ORAL | Status: AC
  Filled 2015-06-26: qty 2

## 2015-06-26 MED ORDER — SODIUM CHLORIDE 0.9 % IV BOLUS (SEPSIS)
1000.0000 mL | Freq: Once | INTRAVENOUS | Status: AC
  Administered 2015-06-26: 1000 mL via INTRAVENOUS

## 2015-06-26 MED ORDER — BARIUM SULFATE 2.1 % PO SUSP: 900.0000 mL | Freq: Once | ORAL | Status: DC

## 2015-06-26 MED ORDER — CIPROFLOXACIN IN D5W 400 MG/200ML IV SOLN
400.0000 mg | Freq: Once | INTRAVENOUS | Status: AC
  Administered 2015-06-26: 400 mg via INTRAVENOUS
  Filled 2015-06-26: qty 200

## 2015-06-26 MED ORDER — FENTANYL CITRATE (PF) 100 MCG/2ML IJ SOLN
25.0000 ug | Freq: Once | INTRAMUSCULAR | Status: AC
  Administered 2015-06-26: 25 ug via INTRAVENOUS
  Filled 2015-06-26: qty 2

## 2015-06-26 NOTE — ED Notes (Signed)
Patient ambulatory to restroom with steady gait.

## 2015-06-26 NOTE — ED Notes (Signed)
Updated patient and family about delay in going to CT. Patient comfortable, denies pain at this time.

## 2015-06-26 NOTE — ED Notes (Signed)
MD on phone with interpreter to discuss CT scan results and plan of care with patient.

## 2015-06-26 NOTE — ED Provider Notes (Signed)
CSN: [id number]     Arrival date & time 06/26/15  1717 History   First MD Initiated Contact with Patient 06/26/15 1801     Chief Complaint  Patient presents with  . Abdominal Pain     (Consider location/radiation/quality/duration/timing/severity/associated sxs/prior Treatment) Patient is a 70 y.o. female presenting with abdominal pain. The history is provided by the patient and the spouse.  Abdominal Pain Pain location: periumbilical to LLQ. Pain quality: burning   Pain radiates to:  LLQ Pain severity:  Severe Onset quality:  Gradual Duration:  2 days Timing:  Constant Progression:  Worsening Chronicity:  New Context: not trauma   Context comment:  Nausea and emesis Relieved by:  Nothing Worsened by:  Palpation Associated symptoms: nausea and vomiting   Associated symptoms: no chest pain, no chills, no constipation, no cough, no dysuria, no fatigue, no fever, no shortness of breath, no sore throat, no vaginal bleeding and no vaginal discharge     Past Medical History  Diagnosis Date  . HTN (hypertension)   . FH: cholecystectomy   . Back pain   . Osteoporosis   . Gastroesophageal reflux disease   . Appendicitis, acute 11/19/2012  . Unspecified essential hypertension 11/19/2012  . GERD (gastroesophageal reflux disease) 11/19/2012  . Diabetes ([place])   . Lipoma     Scalp  . Headache    Past Surgical History  Procedure Laterality Date  . Cesarean section    . Cholecystectomy    . Tonsillectomy    . Laparoscopic appendectomy N/A 11/16/2012    Procedure: APPENDECTOMY LAPAROSCOPIC;  Surgeon: Imogene Burn. Georgette Dover, MD;  Location: [facility];  Service: General;  Laterality: N/A;  . Laparoscopic lysis of adhesions Bilateral 11/16/2012    Procedure: LAPAROSCOPIC LYSIS OF ADHESIONS;  Surgeon: Imogene Burn. Georgette Dover, MD;  Location: [facility];  Service: General;  Laterality: Bilateral;   Family History  Problem Relation Age of Onset  . Lung cancer Father 74    deceased  . Stroke Mother 56   deceased  . Hypertension Sister   . Hypertension Mother   . Diabetes Mellitus I Mother    Social History  Substance Use Topics  . Smoking status: Never Smoker   . Smokeless tobacco: None  . Alcohol Use: No   OB History    No data available     Review of Systems  Constitutional: Negative for fever, chills, diaphoresis, activity change, appetite change and fatigue.  HENT: Negative for facial swelling, rhinorrhea, sore throat, trouble swallowing and voice change.   Eyes: Negative for photophobia, pain and visual disturbance.  Respiratory: Negative for cough, shortness of breath, wheezing and stridor.   Cardiovascular: Negative for chest pain, palpitations and leg swelling.  Gastrointestinal: Positive for nausea, vomiting and abdominal pain. Negative for constipation and anal bleeding.  Endocrine: Negative.   Genitourinary: Negative for dysuria, vaginal bleeding, vaginal discharge and vaginal pain.  Musculoskeletal: Negative for myalgias, back pain and arthralgias.  Skin: Negative.  Negative for rash.  Allergic/Immunologic: Negative.   Neurological: Negative for dizziness, tremors, syncope, weakness and headaches.  Psychiatric/Behavioral: Negative for suicidal ideas, sleep disturbance and self-injury.  All other systems reviewed and are negative.     Allergies  Naratriptan; Penicillins; Iodine; and Penicillin g  Home Medications   Prior to Admission medications   Medication Sig Start Date End Date Taking? Authorizing Provider  ACIPHEX 20 MG tablet TK 1 T PO BID 09/10/14   Historical Provider, MD  ciprofloxacin (CIPRO) 500 MG tablet Take 1 tablet (500  mg total) by mouth 2 (two) times daily. 06/27/15   Margaretann Loveless, MD  diclofenac (VOLTAREN) 25 MG EC tablet Take 25 mg by mouth daily.    Historical Provider, MD  diltiazem (TIAZAC) 120 MG 24 hr capsule Do not take till you talk with your Medical doctor about her blood pressure Patient taking differently: Take 120 mg by mouth daily.  Do not take till you talk with your Medical doctor about her blood pressure 11/17/12   Earnstine Regal, PA-C  furosemide (LASIX) 20 MG tablet TK 1 T PO QD IN THE MORNING 09/19/14   Historical Provider, MD  HYDROcodone-acetaminophen (NORCO/VICODIN) 5-325 MG per tablet Take 1-2 tablets by mouth every 4 (four) hours as needed. 11/17/12   Earnstine Regal, PA-C  magnesium hydroxide (MILK OF MAGNESIA) 800 MG/5ML suspension Take 5 mLs by mouth daily as needed for constipation.    Historical Provider, MD  metFORMIN (GLUCOPHAGE) 500 MG tablet Take 500 mg by mouth 3 (three) times daily.    Historical Provider, MD  metroNIDAZOLE (FLAGYL) 500 MG tablet Take 1 tablet (500 mg total) by mouth 3 (three) times daily. 06/27/15   Margaretann Loveless, MD  nitroGLYCERIN (NITROSTAT) 0.4 MG SL tablet Place 0.4 mg under the tongue every 5 (five) minutes as needed.    Historical Provider, MD  nystatin cream (MYCOSTATIN) APP AA BID 09/19/14   Historical Provider, MD  olmesartan (BENICAR) 20 MG tablet Do not take this till you talk with your Medical doctor. Patient taking differently: Take 20 mg by mouth daily. Do not take this till you talk with your Medical doctor. 11/17/12   Earnstine Regal, PA-C  oxyCODONE (OXY IR/ROXICODONE) 5 MG immediate release tablet Take 5 mg by mouth every 8 (eight) hours as needed. for pain 09/25/14   Historical Provider, MD  oxyCODONE-acetaminophen (PERCOCET) 10-325 MG per tablet TK 1 T PO  Q 8 H PRN P 09/19/14   Historical Provider, MD  PAZEO 0.7 % SOLN INSTILL 1 DROP INTO BOTH EYES QD PRN 09/19/14   Historical Provider, MD  RABEprazole Sodium (ACIPHEX PO) Take 20 mg by mouth daily.    Historical Provider, MD   BP 116/76 mmHg  Pulse 84  Temp(Src) 98.1 F (36.7 C) (Oral)  Resp 18  SpO2 96% Physical Exam  Constitutional: She is oriented to person, place, and time. She appears well-developed and well-nourished. No distress.  HENT:  Head: Normocephalic and atraumatic.  Right Ear: External ear normal.  Left  Ear: External ear normal.  Mouth/Throat: No oropharyngeal exudate.  Eyes: Conjunctivae and EOM are normal. Pupils are equal, round, and reactive to light. No scleral icterus.  Neck: Normal range of motion. Neck supple. No JVD present. No tracheal deviation present. No thyromegaly present.  Cardiovascular: Normal rate, regular rhythm and intact distal pulses.   Pulmonary/Chest: Effort normal and breath sounds normal. No respiratory distress. She has no wheezes. She has no rales.  Abdominal: Soft. Bowel sounds are normal. She exhibits distension. There is tenderness (mild tenderness to periumbilical and LLQ).  Musculoskeletal: Normal range of motion. She exhibits no edema or tenderness.  Neurological: She is alert and oriented to person, place, and time. No cranial nerve deficit. She exhibits normal muscle tone. Coordination normal.  5/5 strength in all 4 extremities. Normal Gait.   Skin: Skin is warm and dry. She is not diaphoretic. No pallor.  Psychiatric: She has a normal mood and affect. She expresses no homicidal and no suicidal ideation. She expresses no suicidal plans and no homicidal  plans.  Nursing note and vitals reviewed.   ED Course  Procedures (including critical care time) Labs Review Labs Reviewed  COMPREHENSIVE METABOLIC PANEL - Abnormal; Notable for the following:    Glucose, Bld 136 (*)    All other components within normal limits  CBC - Abnormal; Notable for the following:    HCT 35.6 (*)    All other components within normal limits  URINALYSIS, ROUTINE W REFLEX MICROSCOPIC (NOT AT [facility]) - Abnormal; Notable for the following:    APPearance TURBID (*)    pH 8.5 (*)    All other components within normal limits  URINE MICROSCOPIC-ADD ON - Abnormal; Notable for the following:    Squamous Epithelial / LPF 0-5 (*)    All other components within normal limits  LIPASE, BLOOD    Imaging Review Ct Abdomen Pelvis Wo Contrast  06/26/2015  CLINICAL DATA:  Abdominal pain,  periumbilical pain radiating to left lower quadrant. History of cholecystectomy, appendectomy and diabetes. EXAM: CT ABDOMEN AND PELVIS WITHOUT CONTRAST TECHNIQUE: Multidetector CT imaging of the abdomen and pelvis was performed following the standard protocol without IV contrast. COMPARISON:  CT abdomen dated 11/16/2012. FINDINGS: Lower chest:  No acute findings. Hepatobiliary: Status post cholecystectomy. No mass visualized within the liver on this unenhanced exam. Pancreas: Partially infiltrated with fat but otherwise unremarkable. Spleen: Within normal limits in size. Adrenals/Urinary Tract: Adrenal glands appear normal. Kidneys are unremarkable without stone or hydronephrosis. No ureteral or bladder calculi identified. Stomach/Bowel: Distal transverse colon, with extension into the surrounding paracolic fat. Remainder of the bowel is unremarkable. No dilated large or small bowel loops. Patient is status post appendectomy. Stomach appears normal. Vascular/Lymphatic: No enlarged lymph nodes seen. Abdominal aorta is normal in caliber. Reproductive: Unremarkable. Other: No abscess collection or free intraperitoneal air. Musculoskeletal: No acute or suspicious osseous lesion. Degenerative changes of the thoracolumbar spine, mild to moderate in degree. Superficial soft tissues are unremarkable. IMPRESSION: 1. Masslike thickening of the walls of the distal transverse colon. While this could represent a focal inflammatory or infectious colitis, possibly even diverticulitis (several diverticula are seen in the area), it is most suspicious for colon cancer. No associated bowel obstruction. 2. Remainder of the abdomen and pelvis CT is unremarkable, as detailed above. Ordering physician was not immediately available for verbal report. Electronically Signed   By: Franki Cabot M.D.   On: 06/26/2015 22:47   I have personally reviewed and evaluated these images and lab results as part of my medical decision-making.   EKG  Interpretation None      MDM   Final diagnoses:  Diverticulitis of large intestine without perforation or abscess without bleeding  Left lower quadrant pain    The patient is a 70 year old female who presents with abdominal pain. Physical exam as above. Patient is afebrile here tympanically stable. CT obtained and shows thickening of the walls of the distal colon concerning for diverticulitis and possibly colon cancer. Findings of CT are discussed with pateint and her husband. The patient has never had a colonoscopy in the past and is strongly adverse to having one now even after discussion of CT findings. Given concern for diverticulitis internal medicine is consulted for requested admission of patient for further management of her abdominal pain.  Dr. Myna Hidalgo has evaluated the patient in the emergency department and feel she is appropriate for discharge with outpatient management. Please see his note for more details.  I've spoken to the patient and her husband after this and they feel  comfortable being discharged. They understand there is a strong possibility of colon cancer and that it is extremely important to follow-up with her primary care doctor for GI referral. We'll prescribe 7 days of ciprofloxacin and Flagyl empirically for presumed diverticularitis patient will take acetaminophen and ibuprofen for pain. Patient has been expressed understanding and agreement with this plan and her discharge with strict ED return precautions.  Patient seen with attending, Dr. Jeanell Sparrow, who oversaw clinical decision making.      Margaretann Loveless, MD 06/27/15 [id number]  Pattricia Boss, MD 06/28/15 1728

## 2015-06-26 NOTE — ED Notes (Signed)
Pt reports right side abd pain that radiates over to LLQ, pain started last night. Having n/v, denies diarrhea.

## 2015-06-26 NOTE — ED Notes (Signed)
Patient c/o mid to lower L abdominal pain for a "long time," but worsening over last couple days. Per pt's husband, pt has had N/V off and on as well, denies diarrhea.

## 2015-06-27 DIAGNOSIS — R1031 Right lower quadrant pain: Secondary | ICD-10-CM

## 2015-06-27 DIAGNOSIS — R109 Unspecified abdominal pain: Secondary | ICD-10-CM | POA: Insufficient documentation

## 2015-06-27 DIAGNOSIS — K5732 Diverticulitis of large intestine without perforation or abscess without bleeding: Secondary | ICD-10-CM | POA: Diagnosis not present

## 2015-06-27 DIAGNOSIS — K639 Disease of intestine, unspecified: Secondary | ICD-10-CM

## 2015-06-27 DIAGNOSIS — R1032 Left lower quadrant pain: Secondary | ICD-10-CM | POA: Diagnosis not present

## 2015-06-27 MED ORDER — METRONIDAZOLE 500 MG PO TABS: 500.0000 mg | ORAL_TABLET | Freq: Three times a day (TID) | ORAL | Status: DC

## 2015-06-27 MED ORDER — CIPROFLOXACIN HCL 500 MG PO TABS: 500.0000 mg | ORAL_TABLET | Freq: Two times a day (BID) | ORAL | Status: DC

## 2015-06-27 NOTE — ED Notes (Signed)
Admitting MD at bedside.

## 2015-06-27 NOTE — ED Notes (Signed)
Patient verbalized understanding of discharge instructions and denies any further needs or questions at this time. VS stable. Patient ambulatory with steady gait.  

## 2015-06-27 NOTE — Consult Note (Signed)
[facility] Medical Consultation  Leah Pruitt [id number] DOB: Dec 15, 1945 DOA: 06/26/2015  Referring physician: ED physician PCP: Pcp Not In System  Specialists: Dr. Tomi Likens (neurology), Dr. Martinique (cardiology)   Reason for consultation:  Abd pain with ?diverticultis vs malignancy   HPI: Leah Pruitt is a 70 y.o. female with PMH of hypertension, type 2 diabetes, and GERD who presents to the ED with abdominal pain, nausea, and constipation with onset approximately one year ago and recent worsening. Patient notes a long-standing history of vague, mild abdominal pain associated with constipation, requiring aggressive bowel regimen to move her bowels at home. She was evaluated for these complaints by her primary care physician and recommended for colonoscopy, but unfortunately he elected not to proceed with this at that time. Approximately one week ago, she noted increased frequency and severity of the abdominal pain. Pain is mainly in the right abdomen that radiates across to the left lower quadrant on occasion. She has been nauseous with this intermittently for the past few days. She denies any diarrhea, melena, or hematochezia. She denies any recent fever, chills, flank pain, or dysuria. She has had similar symptoms for approximately one year, but they have progressively worsened, prompting her visit to the ED.  In ED, patient was found to be afebrile, saturating well on room air, and with vital signs stable. Urine was sent for analysis and grossly normal. CBC and CMP are obtained and entirely unremarkable. Patient was sent for a noncontrast CT of the abdomen and pelvis, which revealed masslike thickening of the transverse colon wall distally. There are several diverticula seen in this area, and while conceivably this could represent a diverticulitis, colon cancers the primary suspicion. There is no associated bowel obstruction and the remainder of the study is unremarkable.  Where does  patient live?   At home     Can patient participate in ADLs?  Yes         Review of Systems:   General: no fevers, chills, sweats, weight change, poor appetite, or fatigue HEENT: no blurry vision, hearing changes or sore throat Pulm: no dyspnea, cough, or wheeze CV: no chest pain or palpitations Abd: no diarrhea.  Abdominal pain, N/V, constipation GU: no dysuria, hematuria, increased urinary frequency, or urgency  Ext: no leg edema Neuro: no focal weakness, numbness, or tingling, no vision change or hearing loss Skin: no rash, no wounds MSK: No muscle spasm, no deformity, no red, hot, or swollen joint Heme: No easy bruising or bleeding Travel history: No recent long distant travel    Allergy:  Allergies  Allergen Reactions  . Naratriptan Anaphylaxis    Rash, difficult time breathing and swallowing  . Penicillins Other (See Comments)    Numbness of mouth and dry mouth  . Iodine Swelling  . Penicillin G Rash    Past Medical History  Diagnosis Date  . HTN (hypertension)   . FH: cholecystectomy   . Back pain   . Osteoporosis   . Gastroesophageal reflux disease   . Appendicitis, acute 11/19/2012  . Unspecified essential hypertension 11/19/2012  . GERD (gastroesophageal reflux disease) 11/19/2012  . Diabetes ([place])   . Lipoma     Scalp  . Headache     Past Surgical History  Procedure Laterality Date  . Cesarean section    . Cholecystectomy    . Tonsillectomy    . Laparoscopic appendectomy N/A 11/16/2012    Procedure: APPENDECTOMY LAPAROSCOPIC;  Surgeon: Imogene Burn. Georgette Dover, MD;  Location: [facility];  Service: General;  Laterality: N/A;  . Laparoscopic lysis of adhesions Bilateral 11/16/2012    Procedure: LAPAROSCOPIC LYSIS OF ADHESIONS;  Surgeon: Imogene Burn. Georgette Dover, MD;  Location: [facility];  Service: General;  Laterality: Bilateral;    Social History:  reports that she has never smoked. She does not have any smokeless tobacco history on file. She reports that she does not drink  alcohol or use illicit drugs.  Family History:  Family History  Problem Relation Age of Onset  . Lung cancer Father 45    deceased  . Stroke Mother 72    deceased  . Hypertension Sister   . Hypertension Mother   . Diabetes Mellitus I Mother      Prior to Admission medications   Medication Sig Start Date End Date Taking? Authorizing Provider  ACIPHEX 20 MG tablet TK 1 T PO BID 09/10/14   Historical Provider, MD  diclofenac (VOLTAREN) 25 MG EC tablet Take 25 mg by mouth daily.    Historical Provider, MD  diltiazem (TIAZAC) 120 MG 24 hr capsule Do not take till you talk with your Medical doctor about her blood pressure Patient taking differently: Take 120 mg by mouth daily. Do not take till you talk with your Medical doctor about her blood pressure 11/17/12   Earnstine Regal, PA-C  furosemide (LASIX) 20 MG tablet TK 1 T PO QD IN THE MORNING 09/19/14   Historical Provider, MD  HYDROcodone-acetaminophen (NORCO/VICODIN) 5-325 MG per tablet Take 1-2 tablets by mouth every 4 (four) hours as needed. 11/17/12   Earnstine Regal, PA-C  magnesium hydroxide (MILK OF MAGNESIA) 800 MG/5ML suspension Take 5 mLs by mouth daily as needed for constipation.    Historical Provider, MD  metFORMIN (GLUCOPHAGE) 500 MG tablet Take 500 mg by mouth 3 (three) times daily.    Historical Provider, MD  nitroGLYCERIN (NITROSTAT) 0.4 MG SL tablet Place 0.4 mg under the tongue every 5 (five) minutes as needed.    Historical Provider, MD  nystatin cream (MYCOSTATIN) APP AA BID 09/19/14   Historical Provider, MD  olmesartan (BENICAR) 20 MG tablet Do not take this till you talk with your Medical doctor. Patient taking differently: Take 20 mg by mouth daily. Do not take this till you talk with your Medical doctor. 11/17/12   Earnstine Regal, PA-C  oxyCODONE (OXY IR/ROXICODONE) 5 MG immediate release tablet Take 5 mg by mouth every 8 (eight) hours as needed. for pain 09/25/14   Historical Provider, MD  oxyCODONE-acetaminophen  (PERCOCET) 10-325 MG per tablet TK 1 T PO  Q 8 H PRN P 09/19/14   Historical Provider, MD  PAZEO 0.7 % SOLN INSTILL 1 DROP INTO BOTH EYES QD PRN 09/19/14   Historical Provider, MD  RABEprazole Sodium (ACIPHEX PO) Take 20 mg by mouth daily.    Historical Provider, MD    Physical Exam: Filed Vitals:   06/26/15 2045 06/26/15 2100 06/26/15 2315 06/26/15 2330  BP:  109/81 124/83 131/76  Pulse: 86 85 97 94  Temp:      TempSrc:      Resp:      SpO2: 100% 97% 99% 97%   General: Not in acute distress HEENT:       Eyes: PERRL, EOMI, no scleral icterus or conjunctival pallor.       ENT: No discharge from the ears or nose, no pharyngeal ulcers, petechiae or exudate, no tonsillar enlargement.        Neck: No JVD, no bruit, no appreciable mass Heme: No cervical adenopathy, no  pallor Cardiac: S1/S2, RRR, No murmurs, No gallops or rubs. Pulm: Good air movement bilaterally. No rales, wheezing, rhonchi or rubs. Abd: Soft, nondistended, nontender, no rebound pain or gaurding, BS present. Ext:  Trace LE edema bilaterally. 2+DP/PT pulse bilaterally. Musculoskeletal: No gross deformity, no red, hot, swollen joints  Skin: No rashes or wounds on exposed surfaces  Neuro: Alert, oriented X3, cranial nerves II-XII grossly intact. No focal findings Psych: Patient is not overtly psychotic, appropriate mood and affect.  Labs on Admission:  Basic Metabolic Panel:  Recent Labs Lab 06/26/15 1825  NA 137  K 3.8  CL 102  CO2 27  GLUCOSE 136*  BUN 13  CREATININE 0.80  CALCIUM 8.9   Liver Function Tests:  Recent Labs Lab 06/26/15 1825  AST 18  ALT 14  ALKPHOS 103  BILITOT 0.5  PROT 6.5  ALBUMIN 3.7    Recent Labs Lab 06/26/15 1825  LIPASE 20   No results for input(s): AMMONIA in the last 168 hours. CBC:  Recent Labs Lab 06/26/15 1825  WBC 9.4  HGB 12.0  HCT 35.6*  MCV 88.3  PLT 216   Cardiac Enzymes: No results for input(s): CKTOTAL, CKMB, CKMBINDEX, TROPONINI in the last 168  hours.  BNP (last 3 results) No results for input(s): BNP in the last 8760 hours.  ProBNP (last 3 results) No results for input(s): PROBNP in the last 8760 hours.  CBG: No results for input(s): GLUCAP in the last 168 hours.  Radiological Exams on Admission: Ct Abdomen Pelvis Wo Contrast  06/26/2015  CLINICAL DATA:  Abdominal pain, periumbilical pain radiating to left lower quadrant. History of cholecystectomy, appendectomy and diabetes. EXAM: CT ABDOMEN AND PELVIS WITHOUT CONTRAST TECHNIQUE: Multidetector CT imaging of the abdomen and pelvis was performed following the standard protocol without IV contrast. COMPARISON:  CT abdomen dated 11/16/2012. FINDINGS: Lower chest:  No acute findings. Hepatobiliary: Status post cholecystectomy. No mass visualized within the liver on this unenhanced exam. Pancreas: Partially infiltrated with fat but otherwise unremarkable. Spleen: Within normal limits in size. Adrenals/Urinary Tract: Adrenal glands appear normal. Kidneys are unremarkable without stone or hydronephrosis. No ureteral or bladder calculi identified. Stomach/Bowel: Distal transverse colon, with extension into the surrounding paracolic fat. Remainder of the bowel is unremarkable. No dilated large or small bowel loops. Patient is status post appendectomy. Stomach appears normal. Vascular/Lymphatic: No enlarged lymph nodes seen. Abdominal aorta is normal in caliber. Reproductive: Unremarkable. Other: No abscess collection or free intraperitoneal air. Musculoskeletal: No acute or suspicious osseous lesion. Degenerative changes of the thoracolumbar spine, mild to moderate in degree. Superficial soft tissues are unremarkable. IMPRESSION: 1. Masslike thickening of the walls of the distal transverse colon. While this could represent a focal inflammatory or infectious colitis, possibly even diverticulitis (several diverticula are seen in the area), it is most suspicious for colon cancer. No associated bowel  obstruction. 2. Remainder of the abdomen and pelvis CT is unremarkable, as detailed above. Ordering physician was not immediately available for verbal report. Electronically Signed   By: Franki Cabot M.D.   On: 06/26/2015 22:47    EKG:  Not indicated at this time   Assessment/Plan  1. Abdominal pain and constipation  - Sxs are chronic and progressive, most likely representing a colon cancer  - The CT abd suggests the possibility of an infectious process, but this does not correlate with the clinical picture  - Ms. Hitsman has received empiric doses of IV Cipro and Flagyl - She received one dose of IV  fentanyl 25 mcg at 18:30 and remains completely pain-free at 00:30 - She is tolerating PO intake adequately and endorses good PCP follow-up - The pt and her husband understand that this is likely cancer  - She remains hemodynamically stable, afebrile, and pain-free - Recommend discharge home to follow-up with PCP for GI referral; discussed plan with pt and her husband who are in agreement  - It would be reasonable to complete a 7-day course of Cipro 500 mg PO q12h, and Flagyl 500 mg PO q8h prior to outpatient colonoscopy  - She can resume her regular diet as tolerated - Her pain score was 4/10 on arrival and management with prn acetaminophen or ibuprofen is recommended    Code Status: Full code Family Communication:  Yes, patient's husband at bed side Disposition Plan: Discharge to home   Date of Service 06/27/2015    Vianne Bulls, MD [facility] Pager [phone]  If 7PM-7AM, please contact night-coverage [email or web address] Password [email or web address] 06/27/2015, 12:21 AM

## 2015-06-28 ENCOUNTER — Encounter: Payer: Self-pay | Admitting: Neurology

## 2015-07-01 DIAGNOSIS — D234 Other benign neoplasm of skin of scalp and neck: Secondary | ICD-10-CM | POA: Insufficient documentation

## 2015-10-29 DIAGNOSIS — G894 Chronic pain syndrome: Secondary | ICD-10-CM | POA: Diagnosis not present

## 2015-12-10 DIAGNOSIS — G894 Chronic pain syndrome: Secondary | ICD-10-CM | POA: Diagnosis not present

## 2015-12-11 DIAGNOSIS — Z23 Encounter for immunization: Secondary | ICD-10-CM | POA: Diagnosis not present

## 2016-02-29 DIAGNOSIS — Z6828 Body mass index (BMI) 28.0-28.9, adult: Secondary | ICD-10-CM | POA: Diagnosis not present

## 2016-02-29 DIAGNOSIS — R002 Palpitations: Secondary | ICD-10-CM | POA: Diagnosis not present

## 2016-02-29 DIAGNOSIS — R51 Headache: Secondary | ICD-10-CM | POA: Diagnosis not present

## 2016-03-14 DIAGNOSIS — Z1329 Encounter for screening for other suspected endocrine disorder: Secondary | ICD-10-CM | POA: Diagnosis not present

## 2016-03-14 DIAGNOSIS — R7309 Other abnormal glucose: Secondary | ICD-10-CM | POA: Diagnosis not present

## 2016-03-14 DIAGNOSIS — Z13220 Encounter for screening for lipoid disorders: Secondary | ICD-10-CM | POA: Diagnosis not present

## 2016-03-14 DIAGNOSIS — R51 Headache: Secondary | ICD-10-CM | POA: Diagnosis not present

## 2016-03-14 DIAGNOSIS — Z79899 Other long term (current) drug therapy: Secondary | ICD-10-CM | POA: Diagnosis not present

## 2016-03-14 DIAGNOSIS — Z131 Encounter for screening for diabetes mellitus: Secondary | ICD-10-CM | POA: Diagnosis not present

## 2016-03-14 DIAGNOSIS — Z6828 Body mass index (BMI) 28.0-28.9, adult: Secondary | ICD-10-CM | POA: Diagnosis not present

## 2016-03-14 DIAGNOSIS — M818 Other osteoporosis without current pathological fracture: Secondary | ICD-10-CM | POA: Diagnosis not present

## 2016-03-14 DIAGNOSIS — I10 Essential (primary) hypertension: Secondary | ICD-10-CM | POA: Diagnosis not present

## 2016-03-14 DIAGNOSIS — G8929 Other chronic pain: Secondary | ICD-10-CM | POA: Diagnosis not present

## 2016-03-23 DIAGNOSIS — I6782 Cerebral ischemia: Secondary | ICD-10-CM | POA: Diagnosis not present

## 2016-03-23 DIAGNOSIS — R51 Headache: Secondary | ICD-10-CM | POA: Diagnosis not present

## 2016-03-23 DIAGNOSIS — G8929 Other chronic pain: Secondary | ICD-10-CM | POA: Diagnosis not present

## 2016-03-23 DIAGNOSIS — R002 Palpitations: Secondary | ICD-10-CM | POA: Diagnosis not present

## 2016-03-23 DIAGNOSIS — R42 Dizziness and giddiness: Secondary | ICD-10-CM | POA: Diagnosis not present

## 2016-03-23 DIAGNOSIS — H538 Other visual disturbances: Secondary | ICD-10-CM | POA: Diagnosis not present

## 2016-03-29 DIAGNOSIS — R51 Headache: Secondary | ICD-10-CM | POA: Diagnosis not present

## 2016-03-29 DIAGNOSIS — G8929 Other chronic pain: Secondary | ICD-10-CM | POA: Diagnosis not present

## 2016-03-29 DIAGNOSIS — I679 Cerebrovascular disease, unspecified: Secondary | ICD-10-CM | POA: Diagnosis not present

## 2016-03-29 DIAGNOSIS — M545 Low back pain: Secondary | ICD-10-CM | POA: Diagnosis not present

## 2016-04-03 DIAGNOSIS — R400 Somnolence: Secondary | ICD-10-CM | POA: Diagnosis not present

## 2016-04-10 DIAGNOSIS — G8929 Other chronic pain: Secondary | ICD-10-CM | POA: Diagnosis not present

## 2016-04-10 DIAGNOSIS — G4734 Idiopathic sleep related nonobstructive alveolar hypoventilation: Secondary | ICD-10-CM | POA: Diagnosis not present

## 2016-04-10 DIAGNOSIS — M545 Low back pain: Secondary | ICD-10-CM | POA: Diagnosis not present

## 2016-05-05 DIAGNOSIS — G4730 Sleep apnea, unspecified: Secondary | ICD-10-CM | POA: Diagnosis not present

## 2016-06-27 DIAGNOSIS — R51 Headache: Secondary | ICD-10-CM | POA: Diagnosis not present

## 2016-06-27 DIAGNOSIS — G4730 Sleep apnea, unspecified: Secondary | ICD-10-CM | POA: Diagnosis not present

## 2016-06-27 DIAGNOSIS — Z6828 Body mass index (BMI) 28.0-28.9, adult: Secondary | ICD-10-CM | POA: Diagnosis not present

## 2016-08-28 ENCOUNTER — Ambulatory Visit (INDEPENDENT_AMBULATORY_CARE_PROVIDER_SITE_OTHER): Payer: Medicare Other | Admitting: Orthopaedic Surgery

## 2016-08-28 ENCOUNTER — Ambulatory Visit (INDEPENDENT_AMBULATORY_CARE_PROVIDER_SITE_OTHER): Payer: Medicare Other

## 2016-08-28 DIAGNOSIS — M25512 Pain in left shoulder: Secondary | ICD-10-CM

## 2016-08-28 DIAGNOSIS — G8929 Other chronic pain: Secondary | ICD-10-CM | POA: Diagnosis not present

## 2016-08-28 DIAGNOSIS — M542 Cervicalgia: Secondary | ICD-10-CM

## 2016-08-28 MED ORDER — TRAMADOL HCL 50 MG PO TABS: 50.0000 mg | ORAL_TABLET | Freq: Three times a day (TID) | ORAL | 0 refills | Status: DC | PRN

## 2016-08-28 MED ORDER — METHYLPREDNISOLONE ACETATE 40 MG/ML IJ SUSP
40.0000 mg | INTRAMUSCULAR | Status: AC | PRN
  Administered 2016-08-28: 40 mg via INTRA_ARTICULAR

## 2016-08-28 MED ORDER — TIZANIDINE HCL 4 MG PO TABS: 4.0000 mg | ORAL_TABLET | Freq: Four times a day (QID) | ORAL | 0 refills | Status: DC | PRN

## 2016-08-28 MED ORDER — LIDOCAINE HCL 1 % IJ SOLN
3.0000 mL | INTRAMUSCULAR | Status: AC | PRN
  Administered 2016-08-28: 3 mL

## 2016-08-28 NOTE — Progress Notes (Signed)
Office Visit Note   Patient: Leah Pruitt           Date of Birth: [birth year]           MRN: [id number] Visit Date: 08/28/2016              Requested by: No referring provider defined for this encounter. PCP: System, Pcp Not In   Assessment & Plan: Visit Diagnoses:  1. Neck pain   2. Chronic left shoulder pain     Plan: She tolerated the steroid injection well and her left shoulder subacromial space. I feel enough crepitation her shoulder that her next visit in 2 weeks I would like an AP outlet and axillary view of the left shoulder. We will make sure this is more of an issue for her and sedative actually her neck.  Follow-Up Instructions: Return in about 2 weeks (around 09/11/2016).   Orders:  Orders Placed This Encounter  Procedures  . Large Joint Injection/Arthrocentesis  . XR Cervical Spine 2 or 3 views   Meds ordered this encounter  Medications  . traMADol (ULTRAM) 50 MG tablet    Sig: Take 1-2 tablets (50-100 mg total) by mouth 3 (three) times daily as needed.    Dispense:  60 tablet    Refill:  0  . tiZANidine (ZANAFLEX) 4 MG tablet    Sig: Take 1 tablet (4 mg total) by mouth every 6 (six) hours as needed for muscle spasms.    Dispense:  30 tablet    Refill:  0      Procedures: Large Joint Inj Date/Time: 08/28/2016 3:22 PM Performed by: Mcarthur Rossetti Authorized by: Mcarthur Rossetti   Location:  Shoulder Site:  L subacromial bursa Ultrasound Guidance: No   Fluoroscopic Guidance: No   Arthrogram: No   Medications:  3 mL lidocaine 1 %; 40 mg methylPREDNISolone acetate 40 MG/ML     Clinical Data: No additional findings.   Subjective: No chief complaint on file. The patient is a non-English-speaking female I'm seeing for the first time. Her husband is with her and interpreted 4. She's been having neck and left shoulder pain for about 2 years now is starting worse. She has headaches all the time as a source of this pain. She is seen  her neurologist to rule out the source of her headaches being a significant physiologic or anatomic issue is a relates to her brain. Headaches are getting worse and is causing a lot of pain. She points her shoulder there is some more source for pain some of her neck. It is definitely detrimentally affecting her activity is daily living, her quality of life, and her mobility.  HPI  Review of Systems She denies any fever, chills, shortness of breath, chest pain, nausea, vomiting.  Objective: Vital Signs: There were no vitals taken for this visit.  Physical Exam She is alert and 3 in no acute distress Ortho Exam Examination of her left shoulder show significant grinding of the glenohumeral joint and she has likely a significant deficit of the rotator cuff. Flexion-extension of the cervical spine does cause pain and she does have a positive Spurling sign to the left side. She has good strength though from the elbow down on both her upper extremities. She has normal sensation in her hands. Specialty Comments:  No specialty comments available.  Imaging: Xr Cervical Spine 2 Or 3 Views  Result Date: 08/28/2016 2 views of the cervical spine AP and lateral show significant degenerative disc  disease between C5 and C6.    PMFS History: Patient Active Problem List   Diagnosis Date Noted  . Abdominal pain   . Left lower quadrant pain   . Mural thickening of colon   . Chest pain at rest 01/02/2013  . Palpitations 01/02/2013  . Appendicitis, acute 11/19/2012  . Unspecified essential hypertension 11/19/2012  . GERD (gastroesophageal reflux disease) 11/19/2012   Past Medical History:  Diagnosis Date  . Appendicitis, acute 11/19/2012  . Back pain   . Diabetes ([place])   . FH: cholecystectomy   . Gastroesophageal reflux disease   . GERD (gastroesophageal reflux disease) 11/19/2012  . Headache   . HTN (hypertension)   . Lipoma    Scalp  . Osteoporosis   . Unspecified essential hypertension  11/19/2012    Family History  Problem Relation Age of Onset  . Lung cancer Father 12       deceased  . Stroke Mother 49       deceased  . Hypertension Sister   . Hypertension Mother   . Diabetes Mellitus I Mother     Past Surgical History:  Procedure Laterality Date  . CESAREAN SECTION    . CHOLECYSTECTOMY    . LAPAROSCOPIC APPENDECTOMY N/A 11/16/2012   Procedure: APPENDECTOMY LAPAROSCOPIC;  Surgeon: Imogene Burn. Georgette Dover, MD;  Location: [place];  Service: General;  Laterality: N/A;  . LAPAROSCOPIC LYSIS OF ADHESIONS Bilateral 11/16/2012   Procedure: LAPAROSCOPIC LYSIS OF ADHESIONS;  Surgeon: Imogene Burn. Georgette Dover, MD;  Location: [place];  Service: General;  Laterality: Bilateral;  . TONSILLECTOMY     Social History   Occupational History  . housewife    Social History Main Topics  . Smoking status: Never Smoker  . Smokeless tobacco: Not on file  . Alcohol use No  . Drug use: No  . Sexual activity: Not on file

## 2016-09-03 ENCOUNTER — Other Ambulatory Visit (INDEPENDENT_AMBULATORY_CARE_PROVIDER_SITE_OTHER): Payer: Self-pay | Admitting: Orthopaedic Surgery

## 2016-09-04 NOTE — Telephone Encounter (Signed)
OK TO REFILL IF SHE GOT 30 BUT NOT 385

## 2016-09-04 NOTE — Telephone Encounter (Signed)
Rx request 

## 2016-09-09 ENCOUNTER — Other Ambulatory Visit (INDEPENDENT_AMBULATORY_CARE_PROVIDER_SITE_OTHER): Payer: Self-pay | Admitting: Orthopaedic Surgery

## 2016-09-11 NOTE — Telephone Encounter (Signed)
Please advise 

## 2016-09-13 ENCOUNTER — Ambulatory Visit (INDEPENDENT_AMBULATORY_CARE_PROVIDER_SITE_OTHER): Payer: Medicare Other | Admitting: Orthopaedic Surgery

## 2016-09-13 ENCOUNTER — Other Ambulatory Visit (INDEPENDENT_AMBULATORY_CARE_PROVIDER_SITE_OTHER): Payer: Self-pay | Admitting: Physician Assistant

## 2016-09-13 NOTE — Telephone Encounter (Signed)
Refill

## 2016-09-24 ENCOUNTER — Other Ambulatory Visit (INDEPENDENT_AMBULATORY_CARE_PROVIDER_SITE_OTHER): Payer: Self-pay | Admitting: Physician Assistant

## 2016-09-25 NOTE — Telephone Encounter (Signed)
Please advise 

## 2016-10-11 ENCOUNTER — Emergency Department (HOSPITAL_COMMUNITY)
Admission: EM | Admit: 2016-10-11 | Discharge: 2016-10-11 | Disposition: A | Discharge status: Home / Self Care | Payer: Medicare Other | Attending: Emergency Medicine | Admitting: Emergency Medicine

## 2016-10-11 ENCOUNTER — Emergency Department (HOSPITAL_COMMUNITY): Payer: Medicare Other

## 2016-10-11 ENCOUNTER — Encounter (HOSPITAL_COMMUNITY): Payer: Self-pay | Admitting: *Deleted

## 2016-10-11 DIAGNOSIS — Z7984 Long term (current) use of oral hypoglycemic drugs: Secondary | ICD-10-CM | POA: Insufficient documentation

## 2016-10-11 DIAGNOSIS — E119 Type 2 diabetes mellitus without complications: Secondary | ICD-10-CM | POA: Diagnosis not present

## 2016-10-11 DIAGNOSIS — R42 Dizziness and giddiness: Secondary | ICD-10-CM | POA: Insufficient documentation

## 2016-10-11 DIAGNOSIS — Z79899 Other long term (current) drug therapy: Secondary | ICD-10-CM | POA: Insufficient documentation

## 2016-10-11 DIAGNOSIS — I10 Essential (primary) hypertension: Secondary | ICD-10-CM | POA: Insufficient documentation

## 2016-10-11 DIAGNOSIS — R51 Headache: Secondary | ICD-10-CM | POA: Diagnosis not present

## 2016-10-11 DIAGNOSIS — R110 Nausea: Secondary | ICD-10-CM | POA: Diagnosis not present

## 2016-10-11 DIAGNOSIS — R519 Headache, unspecified: Secondary | ICD-10-CM

## 2016-10-11 HISTORY — DX: Migraine, unspecified, not intractable, without status migrainosus: G43.909

## 2016-10-11 LAB — DIFFERENTIAL
Basophils Absolute: 0 10*3/uL (ref 0.0–0.1)
Basophils Relative: 0 %
Eosinophils Absolute: 0.1 10*3/uL (ref 0.0–0.7)
Eosinophils Relative: 1 %
Lymphocytes Relative: 19 %
Lymphs Abs: 1 10*3/uL (ref 0.7–4.0)
Monocytes Absolute: 0.4 10*3/uL (ref 0.1–1.0)
Monocytes Relative: 8 %
Neutro Abs: 3.8 10*3/uL (ref 1.7–7.7)
Neutrophils Relative %: 72 %

## 2016-10-11 LAB — COMPREHENSIVE METABOLIC PANEL
ALT: 18 U/L (ref 14–54)
AST: 22 U/L (ref 15–41)
Albumin: 4.1 g/dL (ref 3.5–5.0)
Alkaline Phosphatase: 91 U/L (ref 38–126)
Anion gap: 10 (ref 5–15)
BUN: 14 mg/dL (ref 6–20)
CO2: 26 mmol/L (ref 22–32)
Calcium: 9.7 mg/dL (ref 8.9–10.3)
Chloride: 103 mmol/L (ref 101–111)
Creatinine, Ser: 0.84 mg/dL (ref 0.44–1.00)
GFR calc Af Amer: >60 mL/min (ref >60)
GFR calc non Af Amer: >60 mL/min (ref >60)
Glucose, Bld: 141 mg/dL — ABNORMAL HIGH (ref 65–99)
Potassium: 4.1 mmol/L (ref 3.5–5.1)
Sodium: 139 mmol/L (ref 135–145)
Total Bilirubin: 0.5 mg/dL (ref 0.3–1.2)
Total Protein: 7.1 g/dL (ref 6.5–8.1)

## 2016-10-11 LAB — SEDIMENTATION RATE: Sed Rate: 29 mm/hr — ABNORMAL HIGH (ref 0–22)

## 2016-10-11 LAB — CBC
HCT: 36.4 % (ref 36.0–46.0)
Hemoglobin: 12.1 g/dL (ref 12.0–15.0)
MCH: 29.6 pg (ref 26.0–34.0)
MCHC: 33.2 g/dL (ref 30.0–36.0)
MCV: 89 fL (ref 78.0–100.0)
Platelets: 259 10*3/uL (ref 150–400)
RBC: 4.09 MIL/uL (ref 3.87–5.11)
RDW: 12.8 % (ref 11.5–15.5)
WBC: 5.2 10*3/uL (ref 4.0–10.5)

## 2016-10-11 LAB — CBG MONITORING, ED: Glucose-Capillary: 109 mg/dL — ABNORMAL HIGH (ref 65–99)

## 2016-10-11 LAB — PROTIME-INR
INR: 0.97
Prothrombin Time: 12.9 seconds (ref 11.4–15.2)

## 2016-10-11 LAB — APTT: aPTT: 29 seconds (ref 24–36)

## 2016-10-11 LAB — TROPONIN I: Troponin I: <0.03 ng/mL (ref <0.03)

## 2016-10-11 LAB — C-REACTIVE PROTEIN: CRP: <0.8 mg/dL (ref <1.0)

## 2016-10-11 MED ORDER — PROCHLORPERAZINE EDISYLATE 5 MG/ML IJ SOLN
5.0000 mg | Freq: Once | INTRAMUSCULAR | Status: AC
  Administered 2016-10-11: 5 mg via INTRAVENOUS
  Filled 2016-10-11: qty 2

## 2016-10-11 MED ORDER — KETOROLAC TROMETHAMINE 30 MG/ML IJ SOLN
30.0000 mg | Freq: Once | INTRAMUSCULAR | Status: AC
  Administered 2016-10-11: 30 mg via INTRAVENOUS
  Filled 2016-10-11: qty 1

## 2016-10-11 MED ORDER — DIPHENHYDRAMINE HCL 50 MG/ML IJ SOLN
25.0000 mg | Freq: Once | INTRAMUSCULAR | Status: AC
  Administered 2016-10-11: 25 mg via INTRAVENOUS
  Filled 2016-10-11: qty 1

## 2016-10-11 MED ORDER — SODIUM CHLORIDE 0.9 % IV BOLUS (SEPSIS)
1000.0000 mL | Freq: Once | INTRAVENOUS | Status: AC
  Administered 2016-10-11: 1000 mL via INTRAVENOUS

## 2016-10-11 NOTE — ED Notes (Signed)
CBG 109

## 2016-10-11 NOTE — ED Notes (Signed)
Family at bedside. 

## 2016-10-11 NOTE — ED Triage Notes (Signed)
Pt c/o frontal and parietal headache since last night that with increasing difficulty ambulating.  Pain is accompanied by nausea.  States hx of migraines, but this does not feel like her typical migraine.

## 2016-10-11 NOTE — ED Notes (Signed)
Pt transported to CT ?

## 2016-10-11 NOTE — Discharge Instructions (Signed)
Continue using your usual migraine headaches if you develop another headache. Follow-up with your primary care physician for reevaluation in the next week or so. Return to the ED immediately if any concerning signs or symptoms develop such as fever, neurologic symptoms such as numbness, tingling, or weakness, loss of control of bowel or bladder, or passing out.

## 2016-10-11 NOTE — ED Provider Notes (Signed)
[facility] DEPT Provider Note   CSN: [id number] Arrival date & time: 10/11/16  1052     History   Chief Complaint Chief Complaint  Patient presents with  . Headache  . Gait Problem    HPI Leah Pruitt is a 71 y.o. female with history of diabetes, GERD, migraine headaches who presents today with chief complaint acute onset, constant severe headache which began suddenly last night. Patient's husband served as Optometrist. He states that at around 6:30 PM last night patient developed sharp stabbing bilateral frontotemporal headache. Headache is constant, with intermittent stabbing sensation "inside the head". She denies vision changes, photophobia, phonophobia. She does endorse nausea and lightheadedness, especially on ambulation. She states that she felt her gait was unsteady due to lightheadedness. Denies neck pain, fever, numbness, tingling. Endorses generalized weakness. No trauma or falls. Denies chest pain, short is of breath, vomiting, or abdominal pain. Took a tramadol tablet last night with no improvement. Patient has a history of migraine headaches but states this feels differently as those are usually unilateral and accompanied with vision changes.   The history is provided by the patient and the spouse. The history is limited by a language barrier. A language interpreter was used (patient's husband).    Past Medical History:  Diagnosis Date  . Appendicitis, acute 11/19/2012  . Back pain   . Diabetes ([place])   . FH: cholecystectomy   . Gastroesophageal reflux disease   . GERD (gastroesophageal reflux disease) 11/19/2012  . Headache   . HTN (hypertension)   . Lipoma    Scalp  . Migraines   . Osteoporosis   . Unspecified essential hypertension 11/19/2012    Patient Active Problem List   Diagnosis Date Noted  . Abdominal pain   . Left lower quadrant pain   . Mural thickening of colon   . Chest pain at rest 01/02/2013  . Palpitations 01/02/2013  . Appendicitis, acute  11/19/2012  . Unspecified essential hypertension 11/19/2012  . GERD (gastroesophageal reflux disease) 11/19/2012    Past Surgical History:  Procedure Laterality Date  . CESAREAN SECTION    . CHOLECYSTECTOMY    . LAPAROSCOPIC APPENDECTOMY N/A 11/16/2012   Procedure: APPENDECTOMY LAPAROSCOPIC;  Surgeon: Imogene Burn. Georgette Dover, MD;  Location: [place];  Service: General;  Laterality: N/A;  . LAPAROSCOPIC LYSIS OF ADHESIONS Bilateral 11/16/2012   Procedure: LAPAROSCOPIC LYSIS OF ADHESIONS;  Surgeon: Imogene Burn. Georgette Dover, MD;  Location: [place];  Service: General;  Laterality: Bilateral;  . TONSILLECTOMY      OB History    No data available       Home Medications    Prior to Admission medications   Medication Sig Start Date End Date Taking? Authorizing Provider  ACIPHEX 20 MG tablet TK 1 T PO BID 09/10/14   [provider]  ciprofloxacin (CIPRO) 500 MG tablet Take 1 tablet (500 mg total) by mouth 2 (two) times daily. 06/27/15   Margaretann Loveless, MD  diclofenac (VOLTAREN) 25 MG EC tablet Take 25 mg by mouth daily.    [provider]  diltiazem (TIAZAC) 120 MG 24 hr capsule Do not take till you talk with your Medical doctor about her blood pressure Patient taking differently: Take 120 mg by mouth daily. Do not take till you talk with your Medical doctor about her blood pressure 11/17/12   Earnstine Regal, PA-C  furosemide (LASIX) 20 MG tablet TK 1 T PO QD IN THE MORNING 09/19/14   [provider]  HYDROcodone-acetaminophen (NORCO/VICODIN) 5-325 MG  per tablet Take 1-2 tablets by mouth every 4 (four) hours as needed. 11/17/12   Earnstine Regal, PA-C  magnesium hydroxide (MILK OF MAGNESIA) 800 MG/5ML suspension Take 5 mLs by mouth daily as needed for constipation.    [provider]  metFORMIN (GLUCOPHAGE) 500 MG tablet Take 500 mg by mouth 3 (three) times daily.    [provider]  metroNIDAZOLE (FLAGYL) 500 MG tablet Take 1 tablet (500 mg total) by mouth 3 (three)  times daily. 06/27/15   Margaretann Loveless, MD  nitroGLYCERIN (NITROSTAT) 0.4 MG SL tablet Place 0.4 mg under the tongue every 5 (five) minutes as needed.    [provider]  nystatin cream (MYCOSTATIN) APP AA BID 09/19/14   [provider]  olmesartan (BENICAR) 20 MG tablet Do not take this till you talk with your Medical doctor. Patient taking differently: Take 20 mg by mouth daily. Do not take this till you talk with your Medical doctor. 11/17/12   Earnstine Regal, PA-C  oxyCODONE (OXY IR/ROXICODONE) 5 MG immediate release tablet Take 5 mg by mouth every 8 (eight) hours as needed. for pain 09/25/14   [provider]  oxyCODONE-acetaminophen (PERCOCET) 10-325 MG per tablet TK 1 T PO  Q 8 H PRN P 09/19/14   [provider]  PAZEO 0.7 % SOLN INSTILL 1 DROP INTO BOTH EYES QD PRN 09/19/14   [provider]  RABEprazole Sodium (ACIPHEX PO) Take 20 mg by mouth daily.    [provider]  tiZANidine (ZANAFLEX) 4 MG tablet TAKE 1 TABLET BY MOUTH EVERY 6 HOURS AS NEEDED MUSCLE SPASMS 09/11/16   Mcarthur Rossetti, MD  tiZANidine (ZANAFLEX) 4 MG tablet TAKE 1 TABLET BY MOUTH EVERY 6 HOURS AS NEEDED FOR MUSCLE SPASMS 09/25/16   Pete Pelt, PA-C  traMADol (ULTRAM) 50 MG tablet Take 1-2 tablets (50-100 mg total) by mouth 3 (three) times daily as needed. 08/28/16   Mcarthur Rossetti, MD    Family History Family History  Problem Relation Age of Onset  . Stroke Mother 52       deceased  . Hypertension Mother   . Diabetes Mellitus I Mother   . Lung cancer Father 20       deceased  . Hypertension Sister     Social History Social History  Substance Use Topics  . Smoking status: Never Smoker  . Smokeless tobacco: Never Used  . Alcohol use No     Allergies   Naratriptan; Penicillins; Iodine; and Penicillin g   Review of Systems Review of Systems  Constitutional: Negative for fever.  HENT:       No phonophobia   Eyes: Negative for  photophobia and visual disturbance.  Respiratory: Negative for shortness of breath.   Cardiovascular: Negative for chest pain.  Gastrointestinal: Positive for nausea. Negative for abdominal pain and vomiting.  Musculoskeletal: Negative for neck pain and neck stiffness.  Neurological: Positive for weakness, light-headedness and headaches. Negative for syncope and numbness.  All other systems reviewed and are negative.    Physical Exam Updated Vital Signs BP 108/76   Pulse 86   Temp 98.2 F (36.8 C) (Oral)   Resp 18   Ht '5\' 3"'  (1.6 m)   Wt 62.1 kg (137 lb)   SpO2 99%   BMI 24.27 kg/m   Physical Exam  Constitutional: She is oriented to person, place, and time. She appears well-developed and well-nourished. No distress.  HENT:  Head: Normocephalic and atraumatic.  Right Ear:  External ear normal.  Left Ear: External ear normal.  Bilateral temples maximally tender to palpation. Diffuse generalized tenderness to palpation of the skull. No deformity or crepitus noted.  Eyes: Pupils are equal, round, and reactive to light. Conjunctivae and EOM are normal. Right eye exhibits no discharge. Left eye exhibits no discharge. No scleral icterus.  Neck: Normal range of motion. Neck supple. No JVD present. No tracheal deviation present.  No midline spine TTP, no paraspinal muscle tenderness, no deformity, crepitus, or step-off noted   Cardiovascular: Normal rate, regular rhythm, normal heart sounds and intact distal pulses.   2+ radial and DP/PT pulses bl, negative Homan's bl   Pulmonary/Chest: Effort normal and breath sounds normal.  Abdominal: Soft. She exhibits no distension. There is no tenderness.  Musculoskeletal: She exhibits no edema.  Neurological: She is alert and oriented to person, place, and time. No cranial nerve deficit or sensory deficit.  Fluent speech, no facial droop, sensation intact to soft touch of extremities, normal gait, but lightheadedness elicited on walking. No  pronator drift, negative Romberg, cranial nerves III-XII  tested and intact. Normal finger to nose.  Skin: Skin is warm and dry. No erythema.  Psychiatric: She has a normal mood and affect. Her behavior is normal.  Nursing note and vitals reviewed.    ED Treatments / Results  Labs (all labs ordered are listed, but only abnormal results are displayed) Labs Reviewed  COMPREHENSIVE METABOLIC PANEL - Abnormal; Notable for the following:       Result Value   Glucose, Bld 141 (*)    All other components within normal limits  SEDIMENTATION RATE - Abnormal; Notable for the following:    Sed Rate 29 (*)    All other components within normal limits  CBG MONITORING, ED - Abnormal; Notable for the following:    Glucose-Capillary 109 (*)    All other components within normal limits  PROTIME-INR  APTT  CBC  DIFFERENTIAL  TROPONIN I  C-REACTIVE PROTEIN  I-STAT TROPOININ, ED  I-STAT CHEM 8, ED    EKG  EKG Interpretation  Date/Time:  Wednesday October 11 2016 11:35:50 EDT Ventricular Rate:  97 PR Interval:    QRS Duration: 79 QT Interval:  319 QTC Calculation: 406 R Axis:   32 Text Interpretation:  Sinus rhythm Baseline wander in lead(s) II aVR No significant change since last tracing Confirmed by Virgel Manifold [phone]) on 10/11/2016 2:17:58 PM       Radiology Ct Head Wo Contrast  Result Date: 10/11/2016 CLINICAL DATA:  Headache since last night and difficulty ambulating. EXAM: CT HEAD WITHOUT CONTRAST TECHNIQUE: Contiguous axial images were obtained from the base of the skull through the vertex without intravenous contrast. COMPARISON:  Brain MRI and head CT scans 08/23/2014. FINDINGS: Brain: Mild atrophy is identified. No acute abnormality including hemorrhage, infarct, mass lesion, mass effect, midline shift or abnormal extra-axial fluid collection. No hydrocephalus or pneumocephalus. Vascular: Atherosclerosis noted. Skull: Intact. Sinuses/Orbits: Negative. Other: None. IMPRESSION: No  acute abnormality. Atherosclerosis. Electronically Signed   By: Inge Rise M.D.   On: 10/11/2016 12:47    Procedures Procedures (including critical care time)  Medications Ordered in ED Medications  sodium chloride 0.9 % bolus 1,000 mL (1,000 mLs Intravenous New Bag/Given 10/11/16 1341)  ketorolac (TORADOL) 30 MG/ML injection 30 mg (30 mg Intravenous Given 10/11/16 1342)  prochlorperazine (COMPAZINE) injection 5 mg (5 mg Intravenous Given 10/11/16 1342)  diphenhydrAMINE (BENADRYL) injection 25 mg (25 mg Intravenous Given 10/11/16 1342)  Initial Impression / Assessment and Plan / ED Course  I have reviewed the triage vital signs and the nursing notes.  Pertinent labs & imaging results that were available during my care of the patient were reviewed by me and considered in my medical decision making (see chart for details).     Patient with acute onset of frontotemporal headache with associated lightheadedness. Afebrile, vital signs are stable, no focal neurological deficits. CT head shows no acute abnormalities and no evidence of skull fracture, ICH, or SAH. EKG shows no significant change from last tracing. CMP without significant electrolyte abnormality, and CBC without leukocytosis. Doubt meningitis in the absence of neck stiffness or fever. Doubt CVA in the absence of focal neurological deficits. Suspected possible temporal arteritis, however CRP is within normal limits and ESR is only mildly elevated. Headache entirely resolved after the administration of fluids, Toradol, Compazine, and Benadryl. On reevaluation patient is feeling much better. She is stable for discharge home with follow-up with primary care for reevaluation in the next week. Discussed indications for return to the ED. Patient and patient's husband verbalized understanding of and agreement with plan and patient is stable for discharge home at this time. Patient seen and evaluated by Dr. Wilson Singer who agrees with assessment  and plan.  Final Clinical Impressions(s) / ED Diagnoses   Final diagnoses:  Bad headache    New Prescriptions New Prescriptions   No medications on file     Debroah Baller 10/11/16 1516    Virgel Manifold, MD 10/21/16 1310

## 2016-10-11 NOTE — ED Notes (Signed)
Mini lab called to check status of I stat trop and states wasn't ran and not out of time frame, troponin will be sent to main lab.

## 2016-10-13 DIAGNOSIS — I10 Essential (primary) hypertension: Secondary | ICD-10-CM | POA: Diagnosis not present

## 2016-10-13 DIAGNOSIS — K219 Gastro-esophageal reflux disease without esophagitis: Secondary | ICD-10-CM | POA: Diagnosis not present

## 2016-10-13 DIAGNOSIS — K5792 Diverticulitis of intestine, part unspecified, without perforation or abscess without bleeding: Secondary | ICD-10-CM | POA: Diagnosis not present

## 2016-10-13 DIAGNOSIS — Z79899 Other long term (current) drug therapy: Secondary | ICD-10-CM | POA: Diagnosis not present

## 2016-10-13 DIAGNOSIS — E119 Type 2 diabetes mellitus without complications: Secondary | ICD-10-CM | POA: Diagnosis not present

## 2016-10-16 DIAGNOSIS — R079 Chest pain, unspecified: Secondary | ICD-10-CM | POA: Diagnosis not present

## 2016-10-23 DIAGNOSIS — G4733 Obstructive sleep apnea (adult) (pediatric): Secondary | ICD-10-CM | POA: Diagnosis not present

## 2016-10-31 DIAGNOSIS — L814 Other melanin hyperpigmentation: Secondary | ICD-10-CM | POA: Diagnosis not present

## 2016-11-09 DIAGNOSIS — H16143 Punctate keratitis, bilateral: Secondary | ICD-10-CM | POA: Diagnosis not present

## 2016-12-15 DIAGNOSIS — Z6824 Body mass index (BMI) 24.0-24.9, adult: Secondary | ICD-10-CM | POA: Diagnosis not present

## 2016-12-15 DIAGNOSIS — G4734 Idiopathic sleep related nonobstructive alveolar hypoventilation: Secondary | ICD-10-CM | POA: Diagnosis not present

## 2016-12-15 DIAGNOSIS — M47819 Spondylosis without myelopathy or radiculopathy, site unspecified: Secondary | ICD-10-CM | POA: Diagnosis not present

## 2017-01-28 DIAGNOSIS — Z23 Encounter for immunization: Secondary | ICD-10-CM | POA: Diagnosis not present

## 2017-02-12 DIAGNOSIS — M542 Cervicalgia: Secondary | ICD-10-CM | POA: Diagnosis not present

## 2017-02-12 DIAGNOSIS — M47819 Spondylosis without myelopathy or radiculopathy, site unspecified: Secondary | ICD-10-CM | POA: Diagnosis not present

## 2017-02-12 DIAGNOSIS — M4302 Spondylolysis, cervical region: Secondary | ICD-10-CM | POA: Diagnosis not present

## 2017-02-12 DIAGNOSIS — K5792 Diverticulitis of intestine, part unspecified, without perforation or abscess without bleeding: Secondary | ICD-10-CM | POA: Diagnosis not present

## 2017-02-16 DIAGNOSIS — M542 Cervicalgia: Secondary | ICD-10-CM | POA: Diagnosis not present

## 2017-02-16 DIAGNOSIS — M4802 Spinal stenosis, cervical region: Secondary | ICD-10-CM | POA: Diagnosis not present

## 2017-03-07 DIAGNOSIS — Z6830 Body mass index (BMI) 30.0-30.9, adult: Secondary | ICD-10-CM | POA: Diagnosis not present

## 2017-03-07 DIAGNOSIS — M4712 Other spondylosis with myelopathy, cervical region: Secondary | ICD-10-CM | POA: Diagnosis not present

## 2017-03-07 DIAGNOSIS — I10 Essential (primary) hypertension: Secondary | ICD-10-CM | POA: Diagnosis not present

## 2017-03-09 DIAGNOSIS — Z1231 Encounter for screening mammogram for malignant neoplasm of breast: Secondary | ICD-10-CM | POA: Diagnosis not present

## 2017-05-11 DIAGNOSIS — K219 Gastro-esophageal reflux disease without esophagitis: Secondary | ICD-10-CM | POA: Diagnosis not present

## 2017-05-11 DIAGNOSIS — Z79899 Other long term (current) drug therapy: Secondary | ICD-10-CM | POA: Diagnosis not present

## 2017-05-11 DIAGNOSIS — I10 Essential (primary) hypertension: Secondary | ICD-10-CM | POA: Diagnosis not present

## 2017-05-11 DIAGNOSIS — M47819 Spondylosis without myelopathy or radiculopathy, site unspecified: Secondary | ICD-10-CM | POA: Diagnosis not present

## 2017-05-11 DIAGNOSIS — E119 Type 2 diabetes mellitus without complications: Secondary | ICD-10-CM | POA: Diagnosis not present

## 2017-05-30 DIAGNOSIS — I10 Essential (primary) hypertension: Secondary | ICD-10-CM | POA: Diagnosis not present

## 2017-05-30 DIAGNOSIS — E119 Type 2 diabetes mellitus without complications: Secondary | ICD-10-CM | POA: Diagnosis not present

## 2017-05-30 DIAGNOSIS — G43909 Migraine, unspecified, not intractable, without status migrainosus: Secondary | ICD-10-CM | POA: Diagnosis not present

## 2017-05-30 DIAGNOSIS — R079 Chest pain, unspecified: Secondary | ICD-10-CM | POA: Diagnosis not present

## 2017-05-30 DIAGNOSIS — R0789 Other chest pain: Secondary | ICD-10-CM | POA: Diagnosis not present

## 2017-05-30 DIAGNOSIS — K219 Gastro-esophageal reflux disease without esophagitis: Secondary | ICD-10-CM | POA: Diagnosis not present

## 2017-06-21 DIAGNOSIS — R0789 Other chest pain: Secondary | ICD-10-CM | POA: Diagnosis not present

## 2017-07-08 DIAGNOSIS — S52501A Unspecified fracture of the lower end of right radius, initial encounter for closed fracture: Secondary | ICD-10-CM | POA: Diagnosis not present

## 2017-07-08 DIAGNOSIS — S52602A Unspecified fracture of lower end of left ulna, initial encounter for closed fracture: Secondary | ICD-10-CM | POA: Diagnosis not present

## 2017-07-08 DIAGNOSIS — S60211A Contusion of right wrist, initial encounter: Secondary | ICD-10-CM | POA: Diagnosis not present

## 2017-07-09 ENCOUNTER — Encounter (INDEPENDENT_AMBULATORY_CARE_PROVIDER_SITE_OTHER): Payer: Self-pay | Admitting: Orthopaedic Surgery

## 2017-07-09 ENCOUNTER — Ambulatory Visit (INDEPENDENT_AMBULATORY_CARE_PROVIDER_SITE_OTHER): Payer: Medicare Other

## 2017-07-09 ENCOUNTER — Ambulatory Visit (INDEPENDENT_AMBULATORY_CARE_PROVIDER_SITE_OTHER): Payer: Medicare Other | Admitting: Orthopaedic Surgery

## 2017-07-09 DIAGNOSIS — M25531 Pain in right wrist: Secondary | ICD-10-CM | POA: Insufficient documentation

## 2017-07-09 DIAGNOSIS — S52531A Colles' fracture of right radius, initial encounter for closed fracture: Secondary | ICD-10-CM | POA: Insufficient documentation

## 2017-07-09 HISTORY — DX: Colles' fracture of right radius, initial encounter for closed fracture: S52.531A

## 2017-07-09 HISTORY — DX: Pain in right wrist: M25.531

## 2017-07-09 MED ORDER — HYDROCODONE-ACETAMINOPHEN 5-325 MG PO TABS: 1.0000 | ORAL_TABLET | Freq: Four times a day (QID) | ORAL | 0 refills | Status: DC | PRN

## 2017-07-09 NOTE — Progress Notes (Signed)
Office Visit Note   Patient: Leah Pruitt           Date of Birth: 21-Apr-1945           MRN: [id number] Visit Date: 07/09/2017              Requested by: No referring provider defined for this encounter. PCP: System, Pcp Not In   Assessment & Plan: Visit Diagnoses:  1. Pain in right wrist   2. Colles' fracture of right radius, init for clos fx     Plan: Given the dorsal angulation had to perform a hematoma block with plain lidocaine.  I then was able to manipulate the fracture and put some pressure on it with improving the alignment on postreduction x-rays.  We put her in a well-padded plaster sugar tong splint with appropriate molding as well.  I would like to see her back in 1 week with 2 views of the right wrist out of the splint.  We will likely put her in a short arm cast with some molding at that visit.  All questions concerns were answered and addressed.  I did print out a prescription for hydrocodone to take as needed for pain as well.  Follow-Up Instructions: Return in about 1 week (around 07/16/2017).   Orders:  Orders Placed This Encounter  Procedures  . XR Wrist 2 Views Right   Meds ordered this encounter  Medications  . HYDROcodone-acetaminophen (NORCO/VICODIN) 5-325 MG tablet    Sig: Take 1-2 tablets by mouth every 6 (six) hours as needed for moderate pain.    Dispense:  30 tablet    Refill:  0      Procedures: No procedures performed   Clinical Data: No additional findings.   Subjective: Chief Complaint  Patient presents with  . Right Wrist - Fracture  The patient is a right-hand-dominant non-English-speaking 72 year old female originally from [place].  She fell yesterday on outstretched right arm/wrist and sustained an extra articular distal radius fracture.  She was seen at urgent care clinic in [facility].  She was placed in a sugar tong splint given follow-up in our office today.  She does report wrist pain but denies any other injuries.  This  is not a syncopal episode and denies any loss of consciousness.  Family is with her today to interpret.  HPI  Review of Systems She currently denies any headache, chest pain, shortness of breath, fever, chills, nausea, vomiting.  Objective: Vital Signs: There were no vitals taken for this visit.  Physical Exam She is alert and oriented x3 and in no acute distress Ortho Exam We did take her splint off her examination of her wrist since x-rays that we saw showed a dorsally angulated right distal radius fracture that is extra-articular.  I took the splint off he can see that she does have a clinical deformity.  The skin is intact in her hand exam is normal with normal perfusion and no numbness and tingling.  Her elbow exam is normal as well. Specialty Comments:  No specialty comments available.  Imaging: Xr Wrist 2 Views Right  Result Date: 07/09/2017 Thanks present 2 views of the left wrist show an extra-articular distal radius fracture with slight shortening and slight dorsal angulation.  This is improved over compared to x-rays there were from outside films.  X-rays that accompany her that are independently reviewed show a dorsally angulated extra-articular distal radius fracture with slight shortening.  PMFS History: Patient Active Problem List  Diagnosis Date Noted  . Colles' fracture of right radius, init for clos fx 07/09/2017  . Pain in right wrist 07/09/2017  . Abdominal pain   . Left lower quadrant pain   . Mural thickening of colon   . Chest pain at rest 01/02/2013  . Palpitations 01/02/2013  . Appendicitis, acute 11/19/2012  . Unspecified essential hypertension 11/19/2012  . GERD (gastroesophageal reflux disease) 11/19/2012   Past Medical History:  Diagnosis Date  . Appendicitis, acute 11/19/2012  . Back pain   . Diabetes ([place])   . FH: cholecystectomy   . Gastroesophageal reflux disease   . GERD (gastroesophageal reflux disease) 11/19/2012  . Headache   . HTN  (hypertension)   . Lipoma    Scalp  . Migraines   . Osteoporosis   . Unspecified essential hypertension 11/19/2012    Family History  Problem Relation Age of Onset  . Stroke Mother 16       deceased  . Hypertension Mother   . Diabetes Mellitus I Mother   . Lung cancer Father 82       deceased  . Hypertension Sister     Past Surgical History:  Procedure Laterality Date  . CESAREAN SECTION    . CHOLECYSTECTOMY    . LAPAROSCOPIC APPENDECTOMY N/A 11/16/2012   Procedure: APPENDECTOMY LAPAROSCOPIC;  Surgeon: Imogene Burn. Georgette Dover, MD;  Location: [place];  Service: General;  Laterality: N/A;  . LAPAROSCOPIC LYSIS OF ADHESIONS Bilateral 11/16/2012   Procedure: LAPAROSCOPIC LYSIS OF ADHESIONS;  Surgeon: Imogene Burn. Georgette Dover, MD;  Location: [place];  Service: General;  Laterality: Bilateral;  . TONSILLECTOMY     Social History   Occupational History  . Occupation: housewife  Tobacco Use  . Smoking status: Never Smoker  . Smokeless tobacco: Never Used  Substance and Sexual Activity  . Alcohol use: No  . Drug use: No  . Sexual activity: Not on file

## 2017-07-16 ENCOUNTER — Ambulatory Visit (INDEPENDENT_AMBULATORY_CARE_PROVIDER_SITE_OTHER): Payer: Medicare Other

## 2017-07-16 ENCOUNTER — Encounter (INDEPENDENT_AMBULATORY_CARE_PROVIDER_SITE_OTHER): Payer: Self-pay | Admitting: Orthopaedic Surgery

## 2017-07-16 ENCOUNTER — Ambulatory Visit (INDEPENDENT_AMBULATORY_CARE_PROVIDER_SITE_OTHER): Payer: Medicare Other | Admitting: Orthopaedic Surgery

## 2017-07-16 DIAGNOSIS — M25531 Pain in right wrist: Secondary | ICD-10-CM

## 2017-07-16 DIAGNOSIS — S52531A Colles' fracture of right radius, initial encounter for closed fracture: Secondary | ICD-10-CM

## 2017-07-16 NOTE — Progress Notes (Signed)
Patient somewhat I am seeing in follow-up after last week sustaining a distal radius fracture of her right dominant wrist.  It was significantly displaced and angulated and we reduced it in the office.  Placed in a sugar tong splint.  She is following up today.  She is having significant problems wearing her sling due to previous neck issues.  Out of the splint her hand is swollen but she is neurovascularly intact.  Clinically she looks better overall in terms of alignment there is bruising.  2 views of the right wrist are obtained and show an extra articular distal radius fracture with slight shortening and slight dorsal angulation.  Given other issues were going to try to treat this nonoperative.  Will place in a well-padded short arm cast today and will see her back in 3 weeks to have the cast removed and repeat 2 views of her right wrist cast.  All questions concerns were answered and addressed.

## 2017-07-25 ENCOUNTER — Telehealth (INDEPENDENT_AMBULATORY_CARE_PROVIDER_SITE_OTHER): Payer: Self-pay | Admitting: Orthopaedic Surgery

## 2017-07-25 NOTE — Telephone Encounter (Signed)
Patient's husband called advised  The cast on patient's arm and hand is loose and moving back and forth. He want to know if that is normal or should she come in. The number to contact Johnsie Cancel is [phone]

## 2017-07-26 NOTE — Telephone Encounter (Signed)
Can you work her in today

## 2017-07-27 ENCOUNTER — Ambulatory Visit (INDEPENDENT_AMBULATORY_CARE_PROVIDER_SITE_OTHER): Payer: Medicare Other | Admitting: Orthopaedic Surgery

## 2017-07-27 ENCOUNTER — Encounter (INDEPENDENT_AMBULATORY_CARE_PROVIDER_SITE_OTHER): Payer: Self-pay | Admitting: Orthopaedic Surgery

## 2017-07-27 DIAGNOSIS — S52531A Colles' fracture of right radius, initial encounter for closed fracture: Secondary | ICD-10-CM | POA: Diagnosis not present

## 2017-07-27 NOTE — Progress Notes (Signed)
Cast changed

## 2017-08-06 ENCOUNTER — Encounter (INDEPENDENT_AMBULATORY_CARE_PROVIDER_SITE_OTHER): Payer: Self-pay | Admitting: Orthopaedic Surgery

## 2017-08-06 ENCOUNTER — Ambulatory Visit (INDEPENDENT_AMBULATORY_CARE_PROVIDER_SITE_OTHER): Payer: Medicare Other

## 2017-08-06 ENCOUNTER — Ambulatory Visit (INDEPENDENT_AMBULATORY_CARE_PROVIDER_SITE_OTHER): Payer: Medicare Other | Admitting: Orthopaedic Surgery

## 2017-08-06 DIAGNOSIS — S52531D Colles' fracture of right radius, subsequent encounter for closed fracture with routine healing: Secondary | ICD-10-CM | POA: Diagnosis not present

## 2017-08-06 MED ORDER — TRAMADOL HCL 50 MG PO TABS: 50.0000 mg | ORAL_TABLET | Freq: Four times a day (QID) | ORAL | 0 refills | Status: DC | PRN

## 2017-08-06 NOTE — Progress Notes (Signed)
The patient is a right-hand-dominant non-English-speaking 72 year old who is in continue follow-up after sustaining an extra-articular right distal radius fracture.  We had her in a long-arm splint followed by a short arm cast.  She is now about 4 to 5 weeks into this fracture.  On exam out of the cast she has painful range of motion the wrist is limited secondary pain.  The swelling in her hand but she is neurovascularly intact.  Clinically her wrist appears straight.  2 views of the right wrist are obtained and there is been interval healing of the extra-articular fracture.  There is only slight shortening and slight dorsal angulation.  At this point we can put her in a removable wrist splint.  Through her family member who interprets I told her she can come in and out of the splint for comfort but to wear it more than not.  She can work on range of motion of her wrist as well.  See her back for final visit in 4 weeks for final AP and lateral of the right wrist.  We will send in some tramadol for pain as well.

## 2017-08-20 DIAGNOSIS — M79662 Pain in left lower leg: Secondary | ICD-10-CM | POA: Diagnosis not present

## 2017-08-20 DIAGNOSIS — M5416 Radiculopathy, lumbar region: Secondary | ICD-10-CM | POA: Diagnosis not present

## 2017-08-22 ENCOUNTER — Other Ambulatory Visit (INDEPENDENT_AMBULATORY_CARE_PROVIDER_SITE_OTHER): Payer: Self-pay

## 2017-08-22 ENCOUNTER — Telehealth (INDEPENDENT_AMBULATORY_CARE_PROVIDER_SITE_OTHER): Payer: Self-pay | Admitting: Orthopaedic Surgery

## 2017-08-22 MED ORDER — TRAMADOL HCL 50 MG PO TABS: 50.0000 mg | ORAL_TABLET | Freq: Three times a day (TID) | ORAL | 0 refills | Status: DC | PRN

## 2017-08-22 NOTE — Telephone Encounter (Signed)
Patients family member called on behalf of patient wanting to let Dr. Ninfa Linden know that the ibuprofen that was prescribed to her isn't helping with the pain, and she would like something else please. CB # [phone]

## 2017-08-22 NOTE — Telephone Encounter (Signed)
Please advise 

## 2017-08-22 NOTE — Telephone Encounter (Signed)
Try Tramadol, 1-2 every 8 hours as needed, #60

## 2017-08-22 NOTE — Telephone Encounter (Signed)
Called into pharmacy, patient aware 

## 2017-08-31 DIAGNOSIS — G992 Myelopathy in diseases classified elsewhere: Secondary | ICD-10-CM | POA: Diagnosis not present

## 2017-08-31 DIAGNOSIS — M542 Cervicalgia: Secondary | ICD-10-CM | POA: Diagnosis not present

## 2017-08-31 DIAGNOSIS — K5792 Diverticulitis of intestine, part unspecified, without perforation or abscess without bleeding: Secondary | ICD-10-CM | POA: Diagnosis not present

## 2017-08-31 DIAGNOSIS — M4802 Spinal stenosis, cervical region: Secondary | ICD-10-CM | POA: Diagnosis not present

## 2017-09-04 ENCOUNTER — Ambulatory Visit (INDEPENDENT_AMBULATORY_CARE_PROVIDER_SITE_OTHER): Payer: Medicare Other | Admitting: Physician Assistant

## 2017-09-06 ENCOUNTER — Ambulatory Visit (INDEPENDENT_AMBULATORY_CARE_PROVIDER_SITE_OTHER): Payer: Medicare Other

## 2017-09-06 ENCOUNTER — Ambulatory Visit (INDEPENDENT_AMBULATORY_CARE_PROVIDER_SITE_OTHER): Payer: Medicare Other | Admitting: Physician Assistant

## 2017-09-06 ENCOUNTER — Encounter (INDEPENDENT_AMBULATORY_CARE_PROVIDER_SITE_OTHER): Payer: Self-pay | Admitting: Physician Assistant

## 2017-09-06 DIAGNOSIS — S52531D Colles' fracture of right radius, subsequent encounter for closed fracture with routine healing: Secondary | ICD-10-CM

## 2017-09-06 DIAGNOSIS — M25562 Pain in left knee: Secondary | ICD-10-CM | POA: Diagnosis not present

## 2017-09-06 MED ORDER — DICLOFENAC SODIUM 1 % TD GEL: 2.0000 g | Freq: Four times a day (QID) | TRANSDERMAL | 1 refills | Status: DC

## 2017-09-06 NOTE — Progress Notes (Signed)
Office Visit Note   Patient: Leah Pruitt           Date of Birth: Nov 12, 1945           MRN: [id number] Visit Date: 09/06/2017              Requested by: No referring provider defined for this encounter. PCP: System, Pcp Not In   Assessment & Plan: Visit Diagnoses:  1. Closed Colles' fracture of right radius with routine healing   2. Acute pain of left knee     Plan: We will send her to physical therapy to work on range of motion strengthening of the right hand.  She can discontinue the Velcro wrist splint at this point time.  I did encourage her and her husband to get a squeeze ball for her to use to help with the edema in the hand.  Also elevation.  In regards to the left knee discussed with her cortisone injection left knee she defers.  Therefore we will have her apply Voltaren gel 4 g 4 times daily.  She is given a pullover knee brace.  Also send her to therapy for range of motion strengthening of the left knee.  I like to see her back in 2 weeks to check and see how the knee is doing and if she has had any mechanical symptoms.  No radiographs at that time.  Questions encouraged and answered at length.  Follow-Up Instructions: Return in about 2 weeks (around 09/20/2017).   Orders:  Orders Placed This Encounter  Procedures  . XR Wrist 2 Views Right  . XR Knee 1-2 Views Left  . Ambulatory referral to Physical Therapy   Meds ordered this encounter  Medications  . diclofenac sodium (VOLTAREN) 1 % GEL    Sig: Apply 2-4 g topically 4 (four) times daily.    Dispense:  300 g    Refill:  1      Procedures: No procedures performed   Clinical Data: No additional findings.   Subjective: Chief Complaint  Patient presents with  . Left Knee - Pain  . Right Wrist - Follow-up    HPI Leah Pruitt returns today follow-up of her right wrist fracture.  She is now approximately 9 weeks status post injury.  She is been in a Velcro removable splint.  She is having some pain and  swelling in the hand and unable to make a full fist.  She presents today with her husband who interprets for her.  She is also having left knee pain for the past month.  She has had no injury.  No mechanical symptoms.  She is having difficulty ambulating.  She feels knee is somewhat swollen. Review of Systems No fevers chills shortness of breath chest pain  Objective: Vital Signs: There were no vitals taken for this visit.  Physical Exam  Constitutional: She is oriented to person, place, and time. She appears well-developed and well-nourished. No distress.  Cardiovascular: Intact distal pulses.  Neurological: She is alert and oriented to person, place, and time.  Skin: She is not diaphoretic.  Psychiatric: She has a normal mood and affect.    Ortho Exam Right wrist she has tenderness over the distal radius and ulna.  No gross deformity.  Significant swelling of the hand but no erythema no ecchymosis.  Radial pulses intact.  She has limited flexion of the fingers due to the edema.  Sensation grossly intact to light touch throughout the right hand. Left knee she  has full extension flexion to approximately 110 degrees.  She has tenderness along the lateral joint line but maximally over the medial joint line.  No significant crepitus with passive range of motion of the knee.  No effusion abnormal warmth or erythema.  Calf supple nontender. Specialty Comments:  No specialty comments available.  Imaging: Xr Knee 1-2 Views Left  Result Date: 09/06/2017 Left knee AP lateral views: No acute fracture.  Mild narrowing medial joint line.  Otherwise knee appears well-preserved.  No dislocation subluxation.  Xr Wrist 2 Views Right  Result Date: 09/06/2017 Left wrist AP lateral views: Good consolidation of the distal radius fracture.  No change in overall dorsal angulation and shortening.  Fracture peeler appears well-healed at this point.     PMFS History: Patient Active Problem List   Diagnosis  Date Noted  . Colles' fracture of right radius, init for clos fx 07/09/2017  . Pain in right wrist 07/09/2017  . Abdominal pain   . Left lower quadrant pain   . Mural thickening of colon   . Chest pain at rest 01/02/2013  . Palpitations 01/02/2013  . Appendicitis, acute 11/19/2012  . Unspecified essential hypertension 11/19/2012  . GERD (gastroesophageal reflux disease) 11/19/2012   Past Medical History:  Diagnosis Date  . Appendicitis, acute 11/19/2012  . Back pain   . Diabetes ([place])   . FH: cholecystectomy   . Gastroesophageal reflux disease   . GERD (gastroesophageal reflux disease) 11/19/2012  . Headache   . HTN (hypertension)   . Lipoma    Scalp  . Migraines   . Osteoporosis   . Unspecified essential hypertension 11/19/2012    Family History  Problem Relation Age of Onset  . Stroke Mother 78       deceased  . Hypertension Mother   . Diabetes Mellitus I Mother   . Lung cancer Father 83       deceased  . Hypertension Sister     Past Surgical History:  Procedure Laterality Date  . CESAREAN SECTION    . CHOLECYSTECTOMY    . LAPAROSCOPIC APPENDECTOMY N/A 11/16/2012   Procedure: APPENDECTOMY LAPAROSCOPIC;  Surgeon: Imogene Burn. Georgette Dover, MD;  Location: [facility];  Service: General;  Laterality: N/A;  . LAPAROSCOPIC LYSIS OF ADHESIONS Bilateral 11/16/2012   Procedure: LAPAROSCOPIC LYSIS OF ADHESIONS;  Surgeon: Imogene Burn. Georgette Dover, MD;  Location: [facility];  Service: General;  Laterality: Bilateral;  . TONSILLECTOMY     Social History   Occupational History  . Occupation: housewife  Tobacco Use  . Smoking status: Never Smoker  . Smokeless tobacco: Never Used  Substance and Sexual Activity  . Alcohol use: No  . Drug use: No  . Sexual activity: Not on file

## 2017-09-16 ENCOUNTER — Other Ambulatory Visit (INDEPENDENT_AMBULATORY_CARE_PROVIDER_SITE_OTHER): Payer: Self-pay | Admitting: Orthopaedic Surgery

## 2017-09-17 NOTE — Telephone Encounter (Signed)
Please advise 

## 2017-09-20 ENCOUNTER — Ambulatory Visit (INDEPENDENT_AMBULATORY_CARE_PROVIDER_SITE_OTHER): Payer: Medicare Other | Admitting: Orthopaedic Surgery

## 2017-09-20 DIAGNOSIS — S52531D Colles' fracture of right radius, subsequent encounter for closed fracture with routine healing: Secondary | ICD-10-CM

## 2017-09-20 DIAGNOSIS — M25562 Pain in left knee: Secondary | ICD-10-CM

## 2017-09-20 MED ORDER — GABAPENTIN 300 MG PO CAPS: 300.0000 mg | ORAL_CAPSULE | Freq: Three times a day (TID) | ORAL | 3 refills | Status: DC

## 2017-09-20 MED ORDER — TRAMADOL HCL 50 MG PO TABS: 50.0000 mg | ORAL_TABLET | Freq: Four times a day (QID) | ORAL | 0 refills | Status: DC | PRN

## 2017-09-20 NOTE — Progress Notes (Signed)
The patient is a non-English-speaking 72 year old woman seeing for a while now.  She sustained a right distal radius fracture just in April of this year.  At the last visit when I was out of town she came in also complaining of left knee pain.  She did not want an injection in her knee and x-rays were obtained of her knees show no acute findings and still a well-maintained joint space.  Her son is with her who interprets for her.  She has not been able to put weight on her left knee and is been having a lot of pain with that knee.  She is been to the emergency room recently and they obtain an ultrasound of her leg and make sure she did not have a DVT.  They have her on Neurontin and she said that is help with pain.  She still does not want to have any type of steroid injection in her knee and would like to have an MRI to figure out what is going on with her knee in terms of why she is having pain.  She says her wrist is doing well but her hand is stiff and she cannot fully flex her fingers especially her middle finger.  On exam there is some slight shortening of her distal radius he can tell clinically because she lacks a little bit of dorsiflexion and supination of the wrist.  As far as the wrist joint itself goes she does not have a lot of pain.  She cannot fully make a full fist and this does affect her middle finger as far as flexion goes.  Scars her left knee goes there is no effusion but painful range of motion of her knee and hurts to bear weight and hurts in the back of her knee.  I did independently review the previous x-rays of her left knee that showed no acute findings.  Given the fact that she will not try a steroid injection because she is afraid of how this affects her blood glucose, and MRI is warranted of her left knee to assess for internal derangement since she will not put weight on this knee in spite of conservative treatment measures.  I will send in some tramadol for pain and continue her  Neurontin and we will see her back after the MRI.

## 2017-09-21 ENCOUNTER — Other Ambulatory Visit (INDEPENDENT_AMBULATORY_CARE_PROVIDER_SITE_OTHER): Payer: Self-pay

## 2017-09-21 DIAGNOSIS — M25562 Pain in left knee: Principal | ICD-10-CM

## 2017-09-21 DIAGNOSIS — G8929 Other chronic pain: Secondary | ICD-10-CM

## 2017-09-26 ENCOUNTER — Telehealth (INDEPENDENT_AMBULATORY_CARE_PROVIDER_SITE_OTHER): Payer: Self-pay | Admitting: Orthopedic Surgery

## 2017-09-26 NOTE — Telephone Encounter (Signed)
Patient's husband, Johnsie Cancel, called in to check on the status of an MRI that was ordered for patient last week.

## 2017-09-26 NOTE — Telephone Encounter (Signed)
IC pt husband and advised him that [facility] will be giving him a call to schedule MRI but gave him number to call to schedule if like.

## 2017-10-03 ENCOUNTER — Inpatient Hospital Stay: Admission: RE | Admit: 2017-10-03 | Payer: Medicare Other | Source: Ambulatory Visit

## 2017-10-06 ENCOUNTER — Ambulatory Visit
Admission: RE | Admit: 2017-10-06 | Discharge: 2017-10-06 | Disposition: A | Discharge status: Home / Self Care | Payer: Medicare Other | Source: Ambulatory Visit | Attending: Orthopaedic Surgery | Admitting: Orthopaedic Surgery

## 2017-10-06 DIAGNOSIS — M25562 Pain in left knee: Principal | ICD-10-CM

## 2017-10-06 DIAGNOSIS — M23322 Other meniscus derangements, posterior horn of medial meniscus, left knee: Secondary | ICD-10-CM | POA: Diagnosis not present

## 2017-10-06 DIAGNOSIS — G8929 Other chronic pain: Secondary | ICD-10-CM

## 2017-10-09 ENCOUNTER — Ambulatory Visit (INDEPENDENT_AMBULATORY_CARE_PROVIDER_SITE_OTHER): Payer: Medicare Other | Admitting: Orthopaedic Surgery

## 2017-10-09 ENCOUNTER — Telehealth (INDEPENDENT_AMBULATORY_CARE_PROVIDER_SITE_OTHER): Payer: Self-pay

## 2017-10-09 ENCOUNTER — Encounter (INDEPENDENT_AMBULATORY_CARE_PROVIDER_SITE_OTHER): Payer: Self-pay | Admitting: Orthopaedic Surgery

## 2017-10-09 DIAGNOSIS — M1712 Unilateral primary osteoarthritis, left knee: Secondary | ICD-10-CM | POA: Diagnosis not present

## 2017-10-09 DIAGNOSIS — G8929 Other chronic pain: Secondary | ICD-10-CM

## 2017-10-09 DIAGNOSIS — M25562 Pain in left knee: Secondary | ICD-10-CM | POA: Diagnosis not present

## 2017-10-09 DIAGNOSIS — M179 Osteoarthritis of knee, unspecified: Secondary | ICD-10-CM | POA: Insufficient documentation

## 2017-10-09 MED ORDER — LIDOCAINE HCL 1 % IJ SOLN
3.0000 mL | INTRAMUSCULAR | Status: AC | PRN
  Administered 2017-10-09: 3 mL

## 2017-10-09 MED ORDER — METHYLPREDNISOLONE ACETATE 40 MG/ML IJ SUSP
40.0000 mg | INTRAMUSCULAR | Status: AC | PRN
  Administered 2017-10-09: 40 mg via INTRA_ARTICULAR

## 2017-10-09 NOTE — Telephone Encounter (Signed)
Noted  

## 2017-10-09 NOTE — Progress Notes (Signed)
Office Visit Note   Patient: Leah Pruitt           Date of Birth: 02-06-1946           MRN: [id number] Visit Date: 10/09/2017              Requested by: No referring provider defined for this encounter. PCP: System, Pcp Not In   Assessment & Plan: Visit Diagnoses:  1. Chronic pain of left knee   2. Unilateral primary osteoarthritis, left knee     Plan: Unfortunately I feel that she would need a knee replacement surgery as opposed to knee arthroscopy given the significant cartilage loss in her knee.  She has not had any type of steroid injections I do feel that she would benefit from a steroid injection today as well as an injection of hyaluronic acid in the next month.  I explained the rationale behind this as well as the risk minutes of these types of injections.  Other option would be a knee replacement.  She would like to try the conservative approach first which I agree with.  I did place a steroid injection in her knee today.  We will see her back in 4 weeks for hyaluronic acid injection.  Follow-Up Instructions: Return in about 1 month (around 11/06/2017).   Orders:  Orders Placed This Encounter  Procedures  . Large Joint Inj  . Large Joint Inj   No orders of the defined types were placed in this encounter.     Procedures: Large Joint Inj: L knee on 10/09/2017 10:52 AM Indications: diagnostic evaluation and pain Details: 22 G 1.5 in needle, superolateral approach  Arthrogram: No  Medications: 3 mL lidocaine 1 %; 40 mg methylPREDNISolone acetate 40 MG/ML Outcome: tolerated well, no immediate complications Procedure, treatment alternatives, risks and benefits explained, specific risks discussed. Consent was given by the patient. Immediately prior to procedure a time out was called to verify the correct patient, procedure, equipment, support staff and site/side marked as required. Patient was prepped and draped in the usual sterile fashion.       Clinical  Data: No additional findings.   Subjective: Chief Complaint  Patient presents with  . Left Knee - Pain  . MRI REVIEW  The patient is here today to go over an MRI of her left knee.  Her family member is here to interpret for her.  Her knee pain is been severe and it seemed to come up all of a sudden.  It hurts whether she is putting weight on her knee or she is resting.  It hurts all the time.  We sent her for an MRI due to continued pain in her knee.  She is diabetic but reports good control.  HPI  Review of Systems She currently denies any fever and chills or other acute changes in her medical status.  Objective: Vital Signs: There were no vitals taken for this visit.  Physical Exam She is alert and oriented no acute distress but obvious discomfort Ortho Exam Examination of her left knee she has significant medial joint line tenderness and posterior medial pain.  Her knee is ligamentously stable but definitely has significant pain throughout its range of motion. Specialty Comments:  No specialty comments available.  Imaging: No results found. The MRI of her left knee is reviewed with her and her family and it does show areas of full-thickness cartilage loss of the medial femoral condyle and medial tibial plateau with an insufficiency fracture of  the plateau.  There is also a ruptured Baker's cyst and a complex posterior horn medial meniscal tear.  PMFS History: Patient Active Problem List   Diagnosis Date Noted  . Unilateral primary osteoarthritis, left knee 10/09/2017  . Colles' fracture of right radius, init for clos fx 07/09/2017  . Pain in right wrist 07/09/2017  . Abdominal pain   . Left lower quadrant pain   . Mural thickening of colon   . Chest pain at rest 01/02/2013  . Palpitations 01/02/2013  . Appendicitis, acute 11/19/2012  . Unspecified essential hypertension 11/19/2012  . GERD (gastroesophageal reflux disease) 11/19/2012   Past Medical History:  Diagnosis  Date  . Appendicitis, acute 11/19/2012  . Back pain   . Diabetes ([place])   . FH: cholecystectomy   . Gastroesophageal reflux disease   . GERD (gastroesophageal reflux disease) 11/19/2012  . Headache   . HTN (hypertension)   . Lipoma    Scalp  . Migraines   . Osteoporosis   . Unspecified essential hypertension 11/19/2012    Family History  Problem Relation Age of Onset  . Stroke Mother 33       deceased  . Hypertension Mother   . Diabetes Mellitus I Mother   . Lung cancer Father 30       deceased  . Hypertension Sister     Past Surgical History:  Procedure Laterality Date  . CESAREAN SECTION    . CHOLECYSTECTOMY    . LAPAROSCOPIC APPENDECTOMY N/A 11/16/2012   Procedure: APPENDECTOMY LAPAROSCOPIC;  Surgeon: Imogene Burn. Georgette Dover, MD;  Location: [facility];  Service: General;  Laterality: N/A;  . LAPAROSCOPIC LYSIS OF ADHESIONS Bilateral 11/16/2012   Procedure: LAPAROSCOPIC LYSIS OF ADHESIONS;  Surgeon: Imogene Burn. Georgette Dover, MD;  Location: [facility];  Service: General;  Laterality: Bilateral;  . TONSILLECTOMY     Social History   Occupational History  . Occupation: housewife  Tobacco Use  . Smoking status: Never Smoker  . Smokeless tobacco: Never Used  Substance and Sexual Activity  . Alcohol use: No  . Drug use: No  . Sexual activity: Not on file

## 2017-10-09 NOTE — Telephone Encounter (Signed)
Apply for left knee injection, hyaluronic acid

## 2017-10-11 ENCOUNTER — Telehealth (INDEPENDENT_AMBULATORY_CARE_PROVIDER_SITE_OTHER): Payer: Self-pay

## 2017-10-11 NOTE — Telephone Encounter (Signed)
Submitted application online for Monovisc injection, left knee. 

## 2017-10-23 ENCOUNTER — Telehealth (INDEPENDENT_AMBULATORY_CARE_PROVIDER_SITE_OTHER): Payer: Self-pay

## 2017-10-23 NOTE — Telephone Encounter (Signed)
Patient approved for Monovisc injection, left knee. Buy & Bill No PA required No Co-pay Appt.scheduled for 11/12/2017

## 2017-11-12 ENCOUNTER — Ambulatory Visit (INDEPENDENT_AMBULATORY_CARE_PROVIDER_SITE_OTHER): Payer: Medicare Other | Admitting: Orthopaedic Surgery

## 2017-11-12 ENCOUNTER — Encounter (INDEPENDENT_AMBULATORY_CARE_PROVIDER_SITE_OTHER): Payer: Self-pay | Admitting: Orthopaedic Surgery

## 2017-11-12 DIAGNOSIS — M1712 Unilateral primary osteoarthritis, left knee: Secondary | ICD-10-CM

## 2017-11-12 MED ORDER — HYALURONAN 88 MG/4ML IX SOSY
88.0000 mg | PREFILLED_SYRINGE | INTRA_ARTICULAR | Status: AC | PRN
  Administered 2017-11-12: 88 mg via INTRA_ARTICULAR

## 2017-11-12 NOTE — Progress Notes (Signed)
   Procedure Note  Patient: Leah Pruitt             Date of Birth: October 17, 1945           MRN: [id number]             Visit Date: 11/12/2017  Procedures: Visit Diagnoses: Unilateral primary osteoarthritis, left knee  Large Joint Inj: L knee on 11/12/2017 11:13 AM Indications: diagnostic evaluation and pain Details: 22 G 1.5 in needle, superolateral approach  Arthrogram: No  Medications: 88 mg Hyaluronan 88 MG/4ML Outcome: tolerated well, no immediate complications Procedure, treatment alternatives, risks and benefits explained, specific risks discussed. Consent was given by the patient. Immediately prior to procedure a time out was called to verify the correct patient, procedure, equipment, support staff and site/side marked as required. Patient was prepped and draped in the usual sterile fashion.    The patient is here today for scheduled hyaluronic acid injection with Monovisc into her left knee to treat the pain from osteoarthritis in that knee.  Family is with her that interprets for her.  She has tried steroid injection in the knee.  She has known osteoarthritis in that knee.  On exam she has slight valgus malalignment of the knee.  He has global tenderness with good range of motion.  The knee feels ligaments is stable.  She tolerated the Monovisc injection well into her left knee.  We will see her back in about 6 weeks to see how she is doing overall.

## 2017-11-15 ENCOUNTER — Telehealth (INDEPENDENT_AMBULATORY_CARE_PROVIDER_SITE_OTHER): Payer: Self-pay | Admitting: Orthopaedic Surgery

## 2017-11-15 NOTE — Telephone Encounter (Signed)
Patients husband called and said tramadol has not been called in for her. They need this sent to the [facility] in [place]. He would like a call once sent in # [phone]

## 2017-11-16 MED ORDER — TRAMADOL HCL 50 MG PO TABS: 50.0000 mg | ORAL_TABLET | Freq: Four times a day (QID) | ORAL | 0 refills | Status: DC | PRN

## 2017-11-16 NOTE — Telephone Encounter (Signed)
I sent some in 

## 2017-11-16 NOTE — Telephone Encounter (Signed)
Patient requests Rx for Tramadol

## 2017-12-16 ENCOUNTER — Other Ambulatory Visit (INDEPENDENT_AMBULATORY_CARE_PROVIDER_SITE_OTHER): Payer: Self-pay | Admitting: Orthopaedic Surgery

## 2017-12-17 NOTE — Telephone Encounter (Signed)
Please advise 

## 2017-12-18 ENCOUNTER — Telehealth (INDEPENDENT_AMBULATORY_CARE_PROVIDER_SITE_OTHER): Payer: Self-pay | Admitting: Orthopaedic Surgery

## 2017-12-18 MED ORDER — TRAMADOL HCL 50 MG PO TABS: 50.0000 mg | ORAL_TABLET | Freq: Three times a day (TID) | ORAL | 0 refills | Status: DC | PRN

## 2017-12-18 NOTE — Telephone Encounter (Signed)
Patient's husband called requesting an RX refill on the patient's Tramadol.  She uses the [facility] in [place], [place].  [phone].  Thank you

## 2017-12-18 NOTE — Telephone Encounter (Signed)
I sent some in 

## 2017-12-18 NOTE — Telephone Encounter (Signed)
Please advise 

## 2017-12-19 NOTE — Telephone Encounter (Signed)
noted 

## 2017-12-24 ENCOUNTER — Ambulatory Visit (INDEPENDENT_AMBULATORY_CARE_PROVIDER_SITE_OTHER): Payer: Medicare Other | Admitting: Orthopaedic Surgery

## 2017-12-28 DIAGNOSIS — M4302 Spondylolysis, cervical region: Secondary | ICD-10-CM | POA: Diagnosis not present

## 2017-12-28 DIAGNOSIS — K5792 Diverticulitis of intestine, part unspecified, without perforation or abscess without bleeding: Secondary | ICD-10-CM | POA: Diagnosis not present

## 2017-12-28 DIAGNOSIS — Z23 Encounter for immunization: Secondary | ICD-10-CM | POA: Diagnosis not present

## 2017-12-28 DIAGNOSIS — M542 Cervicalgia: Secondary | ICD-10-CM | POA: Diagnosis not present

## 2017-12-28 DIAGNOSIS — Z6823 Body mass index (BMI) 23.0-23.9, adult: Secondary | ICD-10-CM | POA: Diagnosis not present

## 2018-01-18 DIAGNOSIS — M84362A Stress fracture, left tibia, initial encounter for fracture: Secondary | ICD-10-CM | POA: Diagnosis not present

## 2018-01-18 DIAGNOSIS — M1712 Unilateral primary osteoarthritis, left knee: Secondary | ICD-10-CM | POA: Diagnosis not present

## 2018-01-18 DIAGNOSIS — M25562 Pain in left knee: Secondary | ICD-10-CM | POA: Insufficient documentation

## 2018-01-22 DIAGNOSIS — L298 Other pruritus: Secondary | ICD-10-CM | POA: Diagnosis not present

## 2018-01-22 DIAGNOSIS — L820 Inflamed seborrheic keratosis: Secondary | ICD-10-CM | POA: Diagnosis not present

## 2018-03-01 DIAGNOSIS — M25562 Pain in left knee: Secondary | ICD-10-CM | POA: Diagnosis not present

## 2018-03-01 DIAGNOSIS — M1712 Unilateral primary osteoarthritis, left knee: Secondary | ICD-10-CM | POA: Diagnosis not present

## 2018-03-22 DIAGNOSIS — K5792 Diverticulitis of intestine, part unspecified, without perforation or abscess without bleeding: Secondary | ICD-10-CM | POA: Diagnosis not present

## 2018-03-22 DIAGNOSIS — Z79899 Other long term (current) drug therapy: Secondary | ICD-10-CM | POA: Diagnosis not present

## 2018-03-22 DIAGNOSIS — I10 Essential (primary) hypertension: Secondary | ICD-10-CM | POA: Diagnosis not present

## 2018-03-22 DIAGNOSIS — Z1159 Encounter for screening for other viral diseases: Secondary | ICD-10-CM | POA: Diagnosis not present

## 2018-03-22 DIAGNOSIS — E785 Hyperlipidemia, unspecified: Secondary | ICD-10-CM | POA: Diagnosis not present

## 2018-03-22 DIAGNOSIS — Z0000 Encounter for general adult medical examination without abnormal findings: Secondary | ICD-10-CM | POA: Diagnosis not present

## 2018-03-22 DIAGNOSIS — E1169 Type 2 diabetes mellitus with other specified complication: Secondary | ICD-10-CM | POA: Diagnosis not present

## 2018-06-07 DIAGNOSIS — K219 Gastro-esophageal reflux disease without esophagitis: Secondary | ICD-10-CM | POA: Diagnosis not present

## 2018-06-07 DIAGNOSIS — E785 Hyperlipidemia, unspecified: Secondary | ICD-10-CM | POA: Diagnosis not present

## 2018-06-07 DIAGNOSIS — I10 Essential (primary) hypertension: Secondary | ICD-10-CM | POA: Diagnosis not present

## 2018-06-07 DIAGNOSIS — E1169 Type 2 diabetes mellitus with other specified complication: Secondary | ICD-10-CM | POA: Diagnosis not present

## 2018-07-10 DIAGNOSIS — I10 Essential (primary) hypertension: Secondary | ICD-10-CM | POA: Diagnosis not present

## 2018-07-10 DIAGNOSIS — Z6824 Body mass index (BMI) 24.0-24.9, adult: Secondary | ICD-10-CM | POA: Diagnosis not present

## 2018-07-10 DIAGNOSIS — K219 Gastro-esophageal reflux disease without esophagitis: Secondary | ICD-10-CM | POA: Diagnosis not present

## 2018-07-19 ENCOUNTER — Inpatient Hospital Stay (HOSPITAL_COMMUNITY)
Admission: AD | Admit: 2018-07-19 | Payer: Self-pay | Source: Other Acute Inpatient Hospital | Admitting: Family Medicine

## 2018-12-22 DIAGNOSIS — Z23 Encounter for immunization: Secondary | ICD-10-CM | POA: Diagnosis not present

## 2019-01-02 DIAGNOSIS — E785 Hyperlipidemia, unspecified: Secondary | ICD-10-CM | POA: Diagnosis not present

## 2019-01-02 DIAGNOSIS — K219 Gastro-esophageal reflux disease without esophagitis: Secondary | ICD-10-CM | POA: Diagnosis not present

## 2019-01-02 DIAGNOSIS — I10 Essential (primary) hypertension: Secondary | ICD-10-CM | POA: Diagnosis not present

## 2019-01-02 DIAGNOSIS — E1169 Type 2 diabetes mellitus with other specified complication: Secondary | ICD-10-CM | POA: Diagnosis not present

## 2019-03-18 DIAGNOSIS — E1169 Type 2 diabetes mellitus with other specified complication: Secondary | ICD-10-CM | POA: Diagnosis not present

## 2019-03-18 DIAGNOSIS — Z79899 Other long term (current) drug therapy: Secondary | ICD-10-CM | POA: Diagnosis not present

## 2019-04-14 DIAGNOSIS — Z03818 Encounter for observation for suspected exposure to other biological agents ruled out: Secondary | ICD-10-CM | POA: Diagnosis not present

## 2019-04-14 DIAGNOSIS — Z20828 Contact with and (suspected) exposure to other viral communicable diseases: Secondary | ICD-10-CM | POA: Diagnosis not present

## 2019-05-10 DIAGNOSIS — Z23 Encounter for immunization: Secondary | ICD-10-CM | POA: Diagnosis not present

## 2019-05-22 DIAGNOSIS — I10 Essential (primary) hypertension: Secondary | ICD-10-CM | POA: Diagnosis not present

## 2019-05-22 DIAGNOSIS — E785 Hyperlipidemia, unspecified: Secondary | ICD-10-CM | POA: Diagnosis not present

## 2019-05-22 DIAGNOSIS — K5792 Diverticulitis of intestine, part unspecified, without perforation or abscess without bleeding: Secondary | ICD-10-CM | POA: Diagnosis not present

## 2019-05-22 DIAGNOSIS — E1169 Type 2 diabetes mellitus with other specified complication: Secondary | ICD-10-CM | POA: Diagnosis not present

## 2019-06-07 DIAGNOSIS — Z23 Encounter for immunization: Secondary | ICD-10-CM | POA: Diagnosis not present

## 2019-06-28 DIAGNOSIS — K5730 Diverticulosis of large intestine without perforation or abscess without bleeding: Secondary | ICD-10-CM | POA: Diagnosis not present

## 2019-06-28 DIAGNOSIS — R1012 Left upper quadrant pain: Secondary | ICD-10-CM | POA: Diagnosis not present

## 2019-06-28 DIAGNOSIS — K5792 Diverticulitis of intestine, part unspecified, without perforation or abscess without bleeding: Secondary | ICD-10-CM | POA: Diagnosis not present

## 2019-06-28 DIAGNOSIS — K5732 Diverticulitis of large intestine without perforation or abscess without bleeding: Secondary | ICD-10-CM | POA: Diagnosis not present

## 2019-06-28 DIAGNOSIS — R519 Headache, unspecified: Secondary | ICD-10-CM | POA: Diagnosis not present

## 2019-06-28 DIAGNOSIS — R112 Nausea with vomiting, unspecified: Secondary | ICD-10-CM | POA: Diagnosis not present

## 2019-06-28 DIAGNOSIS — R000 Tachycardia, unspecified: Secondary | ICD-10-CM | POA: Diagnosis not present

## 2019-06-29 DIAGNOSIS — R000 Tachycardia, unspecified: Secondary | ICD-10-CM | POA: Diagnosis not present

## 2019-10-22 DIAGNOSIS — K589 Irritable bowel syndrome without diarrhea: Secondary | ICD-10-CM | POA: Diagnosis not present

## 2019-10-22 DIAGNOSIS — K5792 Diverticulitis of intestine, part unspecified, without perforation or abscess without bleeding: Secondary | ICD-10-CM | POA: Diagnosis not present

## 2019-10-22 DIAGNOSIS — E785 Hyperlipidemia, unspecified: Secondary | ICD-10-CM | POA: Diagnosis not present

## 2019-10-22 DIAGNOSIS — E1169 Type 2 diabetes mellitus with other specified complication: Secondary | ICD-10-CM | POA: Diagnosis not present

## 2019-10-22 DIAGNOSIS — Z0000 Encounter for general adult medical examination without abnormal findings: Secondary | ICD-10-CM | POA: Diagnosis not present

## 2019-10-27 DIAGNOSIS — E785 Hyperlipidemia, unspecified: Secondary | ICD-10-CM | POA: Diagnosis not present

## 2019-10-27 DIAGNOSIS — E1169 Type 2 diabetes mellitus with other specified complication: Secondary | ICD-10-CM | POA: Diagnosis not present

## 2019-10-27 DIAGNOSIS — M542 Cervicalgia: Secondary | ICD-10-CM | POA: Diagnosis not present

## 2019-10-27 DIAGNOSIS — R21 Rash and other nonspecific skin eruption: Secondary | ICD-10-CM | POA: Diagnosis not present

## 2019-12-26 DIAGNOSIS — N390 Urinary tract infection, site not specified: Secondary | ICD-10-CM | POA: Diagnosis not present

## 2019-12-26 DIAGNOSIS — R109 Unspecified abdominal pain: Secondary | ICD-10-CM | POA: Diagnosis not present

## 2019-12-26 DIAGNOSIS — G8929 Other chronic pain: Secondary | ICD-10-CM | POA: Diagnosis not present

## 2019-12-26 DIAGNOSIS — M549 Dorsalgia, unspecified: Secondary | ICD-10-CM | POA: Diagnosis not present

## 2020-01-19 DIAGNOSIS — Z23 Encounter for immunization: Secondary | ICD-10-CM | POA: Diagnosis not present

## 2020-01-30 DIAGNOSIS — N952 Postmenopausal atrophic vaginitis: Secondary | ICD-10-CM | POA: Diagnosis not present

## 2020-01-30 DIAGNOSIS — N390 Urinary tract infection, site not specified: Secondary | ICD-10-CM | POA: Diagnosis not present

## 2020-01-30 DIAGNOSIS — Z6822 Body mass index (BMI) 22.0-22.9, adult: Secondary | ICD-10-CM | POA: Diagnosis not present

## 2020-02-17 DIAGNOSIS — Z6822 Body mass index (BMI) 22.0-22.9, adult: Secondary | ICD-10-CM | POA: Diagnosis not present

## 2020-02-17 DIAGNOSIS — N952 Postmenopausal atrophic vaginitis: Secondary | ICD-10-CM | POA: Diagnosis not present

## 2020-02-17 DIAGNOSIS — N951 Menopausal and female climacteric states: Secondary | ICD-10-CM | POA: Diagnosis not present

## 2020-03-11 DIAGNOSIS — M542 Cervicalgia: Secondary | ICD-10-CM | POA: Diagnosis not present

## 2020-03-11 DIAGNOSIS — N390 Urinary tract infection, site not specified: Secondary | ICD-10-CM | POA: Diagnosis not present

## 2020-03-11 DIAGNOSIS — N952 Postmenopausal atrophic vaginitis: Secondary | ICD-10-CM | POA: Diagnosis not present

## 2020-03-11 DIAGNOSIS — K5909 Other constipation: Secondary | ICD-10-CM | POA: Diagnosis not present

## 2020-04-22 DIAGNOSIS — N390 Urinary tract infection, site not specified: Secondary | ICD-10-CM | POA: Diagnosis not present

## 2020-04-22 DIAGNOSIS — N952 Postmenopausal atrophic vaginitis: Secondary | ICD-10-CM | POA: Diagnosis not present

## 2020-04-22 DIAGNOSIS — Z6823 Body mass index (BMI) 23.0-23.9, adult: Secondary | ICD-10-CM | POA: Diagnosis not present

## 2020-04-22 DIAGNOSIS — K5909 Other constipation: Secondary | ICD-10-CM | POA: Diagnosis not present

## 2020-05-04 DIAGNOSIS — N390 Urinary tract infection, site not specified: Secondary | ICD-10-CM | POA: Diagnosis not present

## 2020-05-04 DIAGNOSIS — N2889 Other specified disorders of kidney and ureter: Secondary | ICD-10-CM | POA: Diagnosis not present

## 2020-05-04 DIAGNOSIS — R109 Unspecified abdominal pain: Secondary | ICD-10-CM | POA: Diagnosis not present

## 2020-05-12 DIAGNOSIS — K5730 Diverticulosis of large intestine without perforation or abscess without bleeding: Secondary | ICD-10-CM | POA: Diagnosis not present

## 2020-05-14 DIAGNOSIS — K5792 Diverticulitis of intestine, part unspecified, without perforation or abscess without bleeding: Secondary | ICD-10-CM | POA: Diagnosis not present

## 2020-05-14 DIAGNOSIS — K582 Mixed irritable bowel syndrome: Secondary | ICD-10-CM | POA: Diagnosis not present

## 2020-05-14 DIAGNOSIS — Z6823 Body mass index (BMI) 23.0-23.9, adult: Secondary | ICD-10-CM | POA: Diagnosis not present

## 2020-05-14 DIAGNOSIS — K5909 Other constipation: Secondary | ICD-10-CM | POA: Diagnosis not present

## 2020-05-14 DIAGNOSIS — N390 Urinary tract infection, site not specified: Secondary | ICD-10-CM | POA: Diagnosis not present

## 2020-06-23 DIAGNOSIS — Z23 Encounter for immunization: Secondary | ICD-10-CM | POA: Diagnosis not present

## 2020-07-21 DIAGNOSIS — K2970 Gastritis, unspecified, without bleeding: Secondary | ICD-10-CM | POA: Diagnosis not present

## 2020-07-21 DIAGNOSIS — R1011 Right upper quadrant pain: Secondary | ICD-10-CM | POA: Diagnosis not present

## 2020-07-21 DIAGNOSIS — K529 Noninfective gastroenteritis and colitis, unspecified: Secondary | ICD-10-CM | POA: Diagnosis not present

## 2020-07-21 DIAGNOSIS — M810 Age-related osteoporosis without current pathological fracture: Secondary | ICD-10-CM | POA: Diagnosis not present

## 2020-07-21 DIAGNOSIS — K2960 Other gastritis without bleeding: Secondary | ICD-10-CM | POA: Diagnosis not present

## 2020-08-11 DIAGNOSIS — R1084 Generalized abdominal pain: Secondary | ICD-10-CM | POA: Diagnosis not present

## 2020-08-11 DIAGNOSIS — R197 Diarrhea, unspecified: Secondary | ICD-10-CM | POA: Diagnosis not present

## 2020-08-11 DIAGNOSIS — R000 Tachycardia, unspecified: Secondary | ICD-10-CM | POA: Diagnosis not present

## 2020-08-11 DIAGNOSIS — R112 Nausea with vomiting, unspecified: Secondary | ICD-10-CM | POA: Diagnosis not present

## 2020-08-12 DIAGNOSIS — R000 Tachycardia, unspecified: Secondary | ICD-10-CM | POA: Diagnosis not present

## 2020-08-20 ENCOUNTER — Other Ambulatory Visit: Payer: Self-pay

## 2020-08-20 ENCOUNTER — Inpatient Hospital Stay (HOSPITAL_COMMUNITY)
Admission: EM | Admit: 2020-08-20 | Discharge: 2020-08-22 | DRG: 684 | Disposition: A | Discharge status: Home / Self Care | Payer: Medicare Other | Attending: Family Medicine | Admitting: Family Medicine

## 2020-08-20 ENCOUNTER — Emergency Department (HOSPITAL_COMMUNITY): Payer: Medicare Other

## 2020-08-20 ENCOUNTER — Encounter (HOSPITAL_COMMUNITY): Payer: Self-pay

## 2020-08-20 DIAGNOSIS — E785 Hyperlipidemia, unspecified: Secondary | ICD-10-CM | POA: Diagnosis present

## 2020-08-20 DIAGNOSIS — N179 Acute kidney failure, unspecified: Principal | ICD-10-CM | POA: Diagnosis present

## 2020-08-20 DIAGNOSIS — Z801 Family history of malignant neoplasm of trachea, bronchus and lung: Secondary | ICD-10-CM

## 2020-08-20 DIAGNOSIS — K5790 Diverticulosis of intestine, part unspecified, without perforation or abscess without bleeding: Secondary | ICD-10-CM | POA: Diagnosis not present

## 2020-08-20 DIAGNOSIS — M1990 Unspecified osteoarthritis, unspecified site: Secondary | ICD-10-CM | POA: Diagnosis present

## 2020-08-20 DIAGNOSIS — I10 Essential (primary) hypertension: Secondary | ICD-10-CM | POA: Diagnosis not present

## 2020-08-20 DIAGNOSIS — R748 Abnormal levels of other serum enzymes: Secondary | ICD-10-CM | POA: Diagnosis present

## 2020-08-20 DIAGNOSIS — Z7984 Long term (current) use of oral hypoglycemic drugs: Secondary | ICD-10-CM

## 2020-08-20 DIAGNOSIS — K5900 Constipation, unspecified: Secondary | ICD-10-CM

## 2020-08-20 DIAGNOSIS — R1032 Left lower quadrant pain: Secondary | ICD-10-CM | POA: Diagnosis present

## 2020-08-20 DIAGNOSIS — N1831 Chronic kidney disease, stage 3a: Secondary | ICD-10-CM | POA: Diagnosis present

## 2020-08-20 DIAGNOSIS — K5909 Other constipation: Secondary | ICD-10-CM | POA: Diagnosis present

## 2020-08-20 DIAGNOSIS — M810 Age-related osteoporosis without current pathological fracture: Secondary | ICD-10-CM | POA: Diagnosis present

## 2020-08-20 DIAGNOSIS — Z20822 Contact with and (suspected) exposure to covid-19: Secondary | ICD-10-CM | POA: Diagnosis not present

## 2020-08-20 DIAGNOSIS — Z9049 Acquired absence of other specified parts of digestive tract: Secondary | ICD-10-CM

## 2020-08-20 DIAGNOSIS — M4319 Spondylolisthesis, multiple sites in spine: Secondary | ICD-10-CM | POA: Diagnosis not present

## 2020-08-20 DIAGNOSIS — Z8249 Family history of ischemic heart disease and other diseases of the circulatory system: Secondary | ICD-10-CM

## 2020-08-20 DIAGNOSIS — Z981 Arthrodesis status: Secondary | ICD-10-CM

## 2020-08-20 DIAGNOSIS — Z881 Allergy status to other antibiotic agents status: Secondary | ICD-10-CM

## 2020-08-20 DIAGNOSIS — K219 Gastro-esophageal reflux disease without esophagitis: Secondary | ICD-10-CM | POA: Diagnosis present

## 2020-08-20 DIAGNOSIS — G8929 Other chronic pain: Secondary | ICD-10-CM | POA: Diagnosis not present

## 2020-08-20 DIAGNOSIS — Z823 Family history of stroke: Secondary | ICD-10-CM

## 2020-08-20 DIAGNOSIS — Z880 Allergy status to penicillin: Secondary | ICD-10-CM

## 2020-08-20 DIAGNOSIS — N200 Calculus of kidney: Secondary | ICD-10-CM | POA: Diagnosis not present

## 2020-08-20 DIAGNOSIS — M1712 Unilateral primary osteoarthritis, left knee: Secondary | ICD-10-CM | POA: Diagnosis present

## 2020-08-20 DIAGNOSIS — Z888 Allergy status to other drugs, medicaments and biological substances status: Secondary | ICD-10-CM

## 2020-08-20 DIAGNOSIS — M545 Low back pain, unspecified: Secondary | ICD-10-CM | POA: Diagnosis present

## 2020-08-20 DIAGNOSIS — N1330 Unspecified hydronephrosis: Secondary | ICD-10-CM | POA: Diagnosis not present

## 2020-08-20 DIAGNOSIS — E1122 Type 2 diabetes mellitus with diabetic chronic kidney disease: Secondary | ICD-10-CM | POA: Diagnosis present

## 2020-08-20 DIAGNOSIS — N132 Hydronephrosis with renal and ureteral calculous obstruction: Secondary | ICD-10-CM | POA: Diagnosis present

## 2020-08-20 DIAGNOSIS — Q6211 Congenital occlusion of ureteropelvic junction: Secondary | ICD-10-CM

## 2020-08-20 DIAGNOSIS — R3129 Other microscopic hematuria: Secondary | ICD-10-CM | POA: Diagnosis present

## 2020-08-20 DIAGNOSIS — Z833 Family history of diabetes mellitus: Secondary | ICD-10-CM

## 2020-08-20 LAB — URINALYSIS, ROUTINE W REFLEX MICROSCOPIC
Bilirubin Urine: NEGATIVE
Glucose, UA: NEGATIVE mg/dL
Ketones, ur: NEGATIVE mg/dL
Leukocytes,Ua: NEGATIVE
Nitrite: NEGATIVE
Protein, ur: NEGATIVE mg/dL
Specific Gravity, Urine: 1.01 (ref 1.005–1.030)
pH: 8 (ref 5.0–8.0)

## 2020-08-20 LAB — COMPREHENSIVE METABOLIC PANEL
ALT: 15 U/L (ref 0–44)
AST: 19 U/L (ref 15–41)
Albumin: 4.2 g/dL (ref 3.5–5.0)
Alkaline Phosphatase: 208 U/L — ABNORMAL HIGH (ref 38–126)
Anion gap: 8 (ref 5–15)
BUN: 19 mg/dL (ref 8–23)
CO2: 27 mmol/L (ref 22–32)
Calcium: 10.2 mg/dL (ref 8.9–10.3)
Chloride: 102 mmol/L (ref 98–111)
Creatinine, Ser: 1.71 mg/dL — ABNORMAL HIGH (ref 0.44–1.00)
GFR, Estimated: 31 mL/min — ABNORMAL LOW (ref >60)
Glucose, Bld: 81 mg/dL (ref 70–99)
Potassium: 4.1 mmol/L (ref 3.5–5.1)
Sodium: 137 mmol/L (ref 135–145)
Total Bilirubin: 0.6 mg/dL (ref 0.3–1.2)
Total Protein: 6.9 g/dL (ref 6.5–8.1)

## 2020-08-20 LAB — CBC
HCT: 36.2 % (ref 36.0–46.0)
Hemoglobin: 11.6 g/dL — ABNORMAL LOW (ref 12.0–15.0)
MCH: 28.4 pg (ref 26.0–34.0)
MCHC: 32 g/dL (ref 30.0–36.0)
MCV: 88.7 fL (ref 80.0–100.0)
Platelets: 243 10*3/uL (ref 150–400)
RBC: 4.08 MIL/uL (ref 3.87–5.11)
RDW: 13.9 % (ref 11.5–15.5)
WBC: 6.7 10*3/uL (ref 4.0–10.5)
nRBC: 0 % (ref 0.0–0.2)

## 2020-08-20 LAB — LACTIC ACID, PLASMA
Lactic Acid, Venous: 2.2 mmol/L (ref 0.5–1.9)
Lactic Acid, Venous: 2.1 mmol/L (ref 0.5–1.9)

## 2020-08-20 LAB — SARS CORONAVIRUS 2 (TAT 6-24 HRS): SARS Coronavirus 2: NEGATIVE

## 2020-08-20 LAB — LIPASE, BLOOD: Lipase: 24 U/L (ref 11–51)

## 2020-08-20 IMAGING — CT CT ABD-PELV W/O CM
2 of 4 series · 16 of 46 positions shown, 18 images · non-contrast
Comparison: [DATE]

CLINICAL DATA: Left lower quadrant pain, history of diverticulitis.

EXAM:
CT ABDOMEN AND PELVIS WITHOUT CONTRAST
TECHNIQUE: Multidetector CT imaging of the abdomen and pelvis was performed
following the standard protocol without IV contrast.

[Series 3: abd/ pelvis 5.0 i30f 2 · axial · 0.76mm/px · z∈[+786,+1146]mm · 13 of 80 slices shown, 15 images]
[im 4/80  soft-tissue]
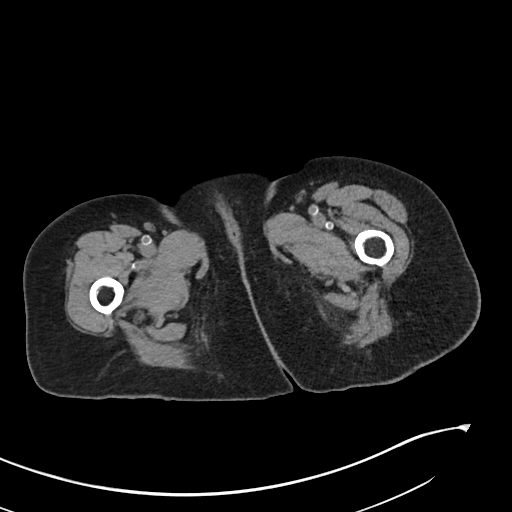
[im 4/80  bone]
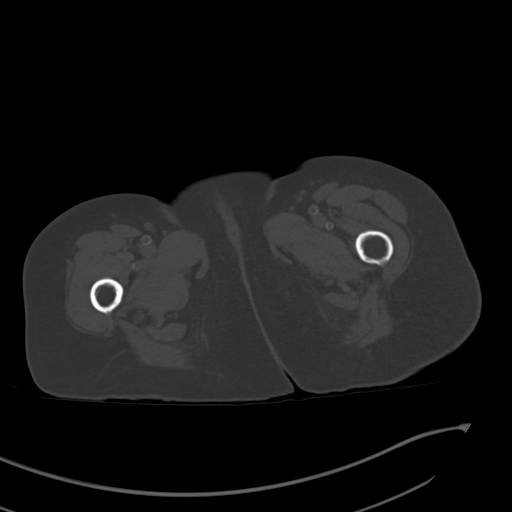
[im 10/80  soft-tissue]
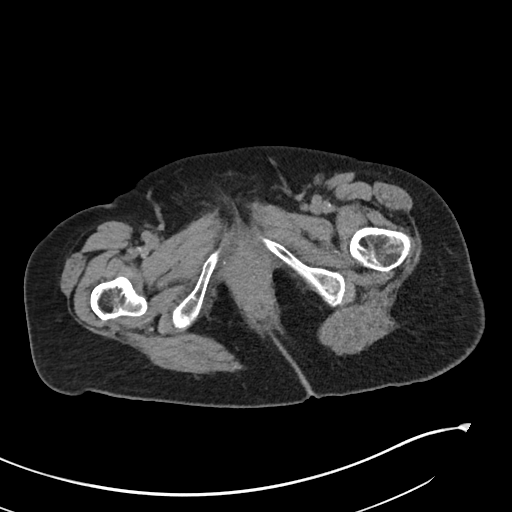
[im 17/80  soft-tissue]
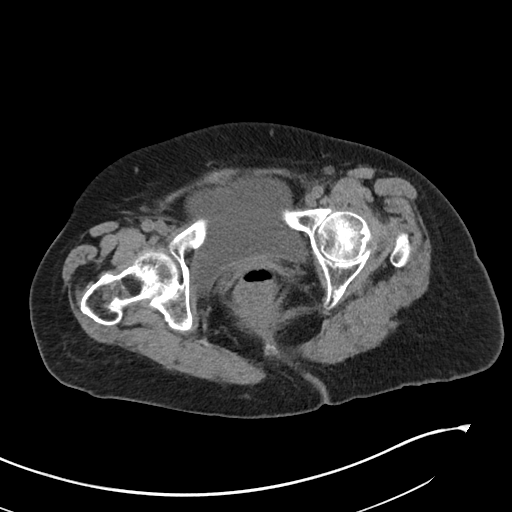
[im 24/80  soft-tissue]
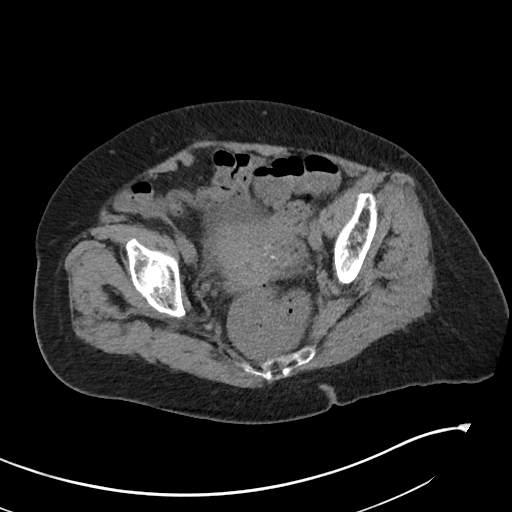
[im 27/80  soft-tissue]
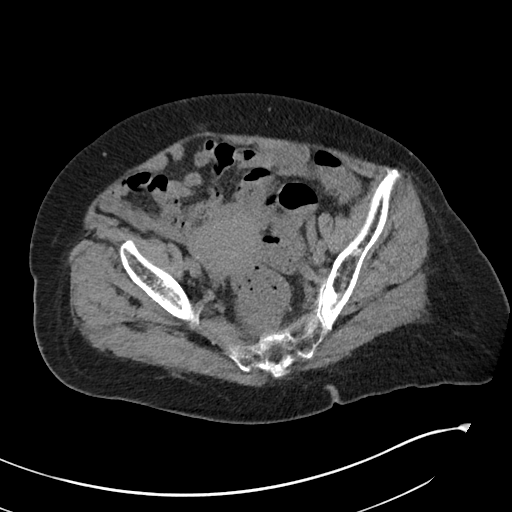
[im 33/80  soft-tissue]
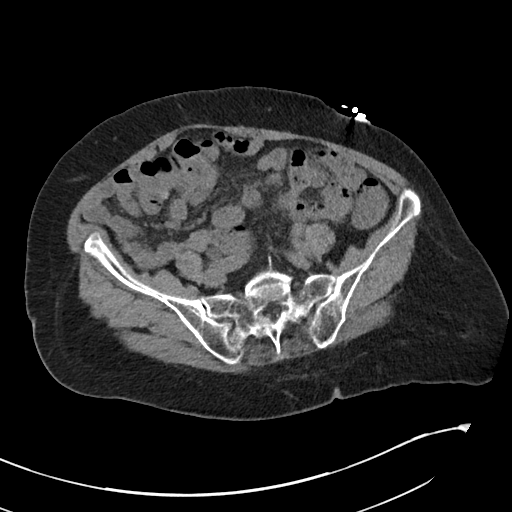
[im 40/80  soft-tissue]
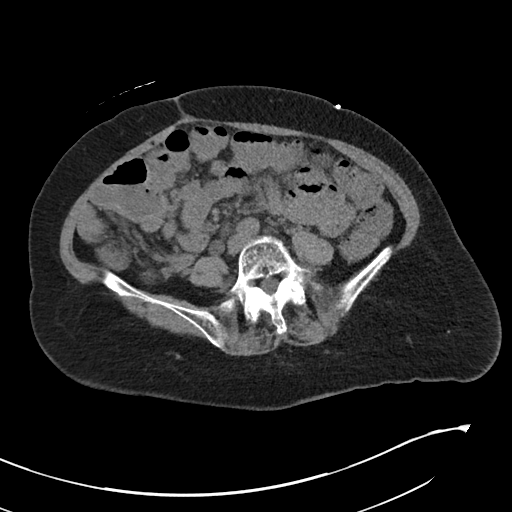
[im 47/80  soft-tissue]
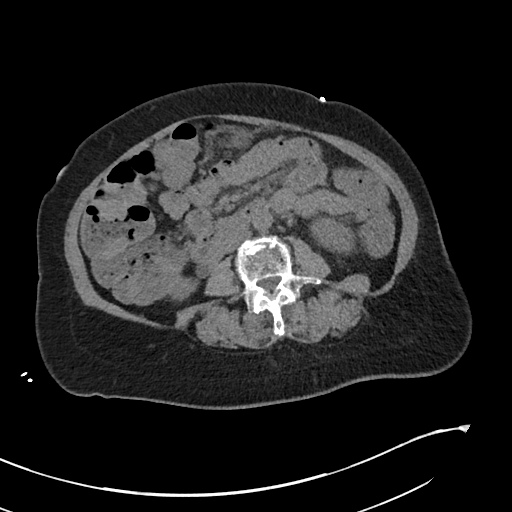
[im 53/80  soft-tissue]
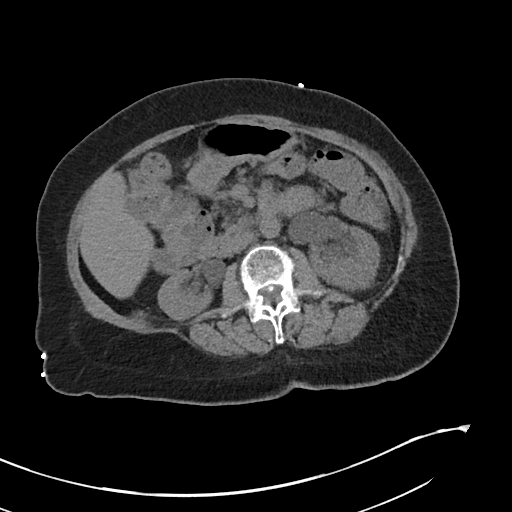
[im 53/80  bone]
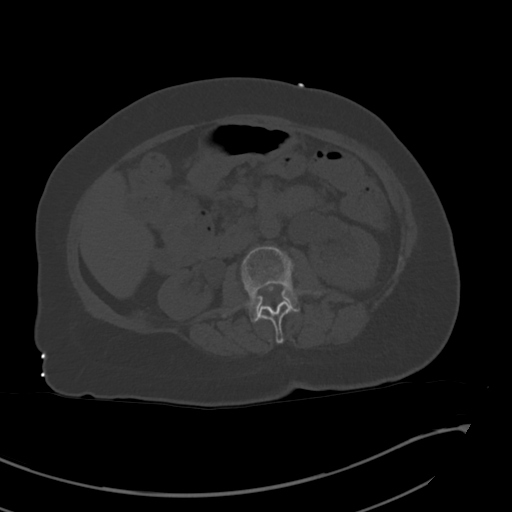
[im 56/80  soft-tissue]
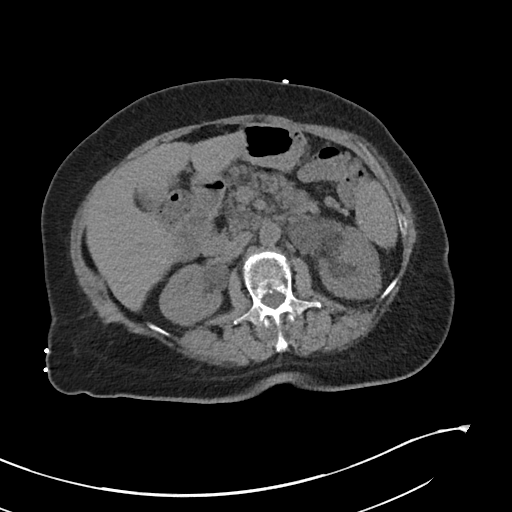
[im 63/80  soft-tissue]
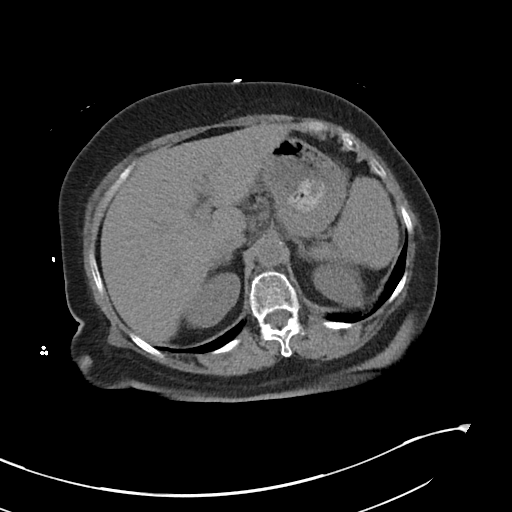
[im 70/80  soft-tissue]
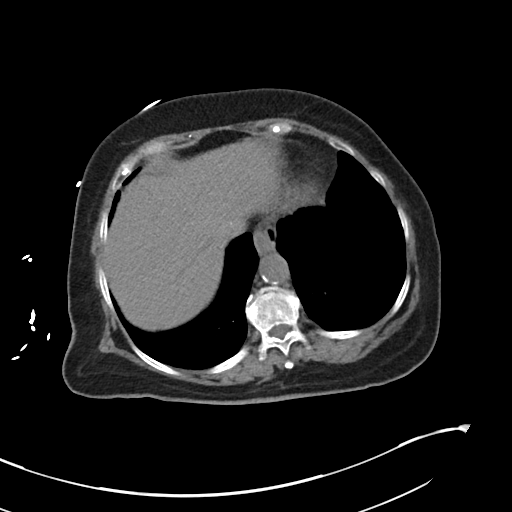
[im 76/80  soft-tissue]
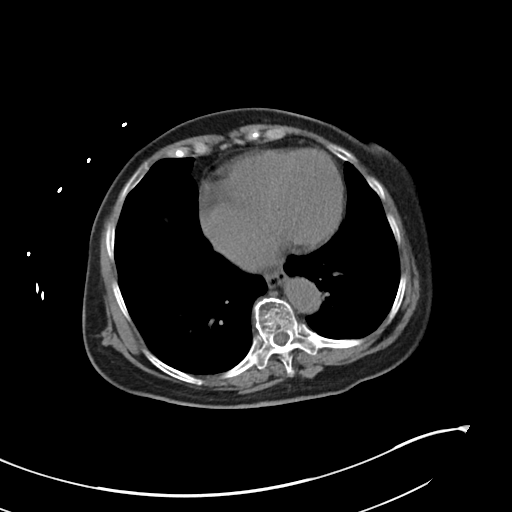

[Series 6: cor st · coronal · 0.72mm/px · 3 of 94 slices shown]
[im 32/94  soft-tissue]
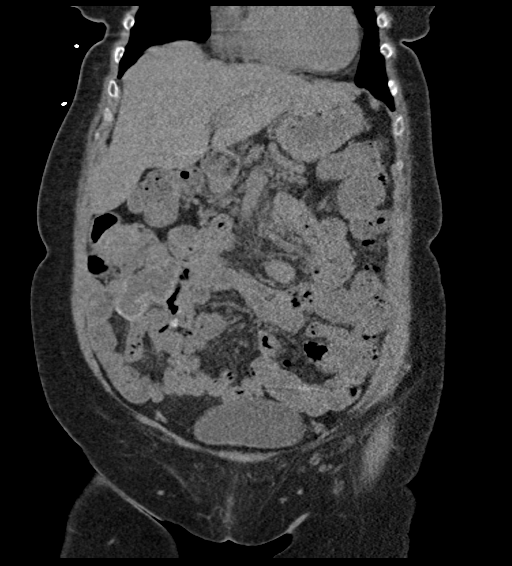
[im 42/94  soft-tissue]
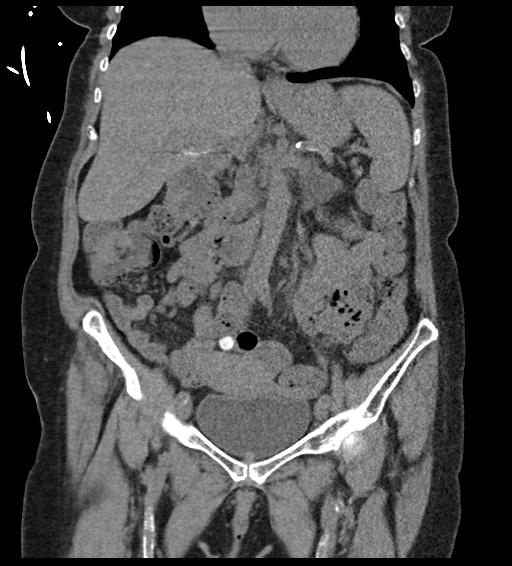
[im 52/94  soft-tissue]
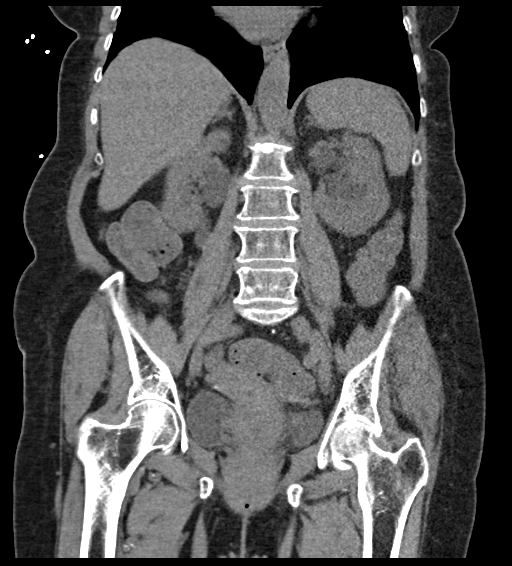

[16 of 46 positions shown; findings below may reference images not displayed]

FINDINGS: Lower chest: No acute abnormality.

Hepatobiliary: No focal liver abnormality is seen. Status post
cholecystectomy. No biliary dilatation.

Pancreas: Unremarkable. No pancreatic ductal dilatation or
surrounding inflammatory changes.

Spleen: Normal in size without focal abnormality.

Adrenals/Urinary Tract: Adrenal glands are within normal limits.
Kidneys are well visualized bilaterally. Punctate nonobstructing
right renal stone is noted. Mildly prominent extrarenal pelvis is
noted. Prominence of the right ureter is seen to the level of the
iliac artery without definitive obstructive change or calculus. This
may be related to some extrinsic compression by the iliac artery.
Prominent extrarenal pelvis and hydronephrosis is noted on the left.
This is new from the prior exam although no ureteral obstructive
changes are seen. Bladder is well distended.

Stomach/Bowel: The appendix is not well visualized consistent with a
prior surgical history. No obstructive or inflammatory changes of
the large or small bowel are seen. Mild diverticular changes noted.
Stomach is within normal limits.

Vascular/Lymphatic: Aortic atherosclerosis. No enlarged abdominal or
pelvic lymph nodes.

Reproductive: Uterus and bilateral adnexa are unremarkable.

Other: No abdominal wall hernia or abnormality. No abdominopelvic
ascites.

Musculoskeletal: Mild degenerative changes of the lumbar spine are
noted with anterolisthesis of L5 on S1.
IMPRESSION: Status post cholecystectomy and appendectomy.

Bilateral prominent renal collecting systems without definitive
obstructing stone. These changes on the right are likely related to
crossing over iliac artery as the more distal ureter is within
normal limits. The changes on the left may be related to a degree of
UPJ obstruction.

## 2020-08-20 MED ORDER — ONDANSETRON HCL 4 MG/2ML IJ SOLN
4.0000 mg | Freq: Once | INTRAMUSCULAR | Status: AC
  Administered 2020-08-20: 4 mg via INTRAVENOUS
  Filled 2020-08-20 (×2): qty 2

## 2020-08-20 MED ORDER — FLEET ENEMA 7-19 GM/118ML RE ENEM
1.0000 | ENEMA | Freq: Once | RECTAL | Status: DC | PRN
  Filled 2020-08-20: qty 1

## 2020-08-20 MED ORDER — MORPHINE SULFATE (PF) 4 MG/ML IV SOLN
4.0000 mg | Freq: Once | INTRAVENOUS | Status: AC
Start: 2020-08-20 — End: 2020-08-20
  Administered 2020-08-20: 4 mg via INTRAVENOUS
  Filled 2020-08-20 (×2): qty 1

## 2020-08-20 MED ORDER — SODIUM CHLORIDE 0.9 % IV BOLUS
500.0000 mL | Freq: Once | INTRAVENOUS | Status: AC
  Administered 2020-08-20: 500 mL via INTRAVENOUS

## 2020-08-20 MED ORDER — DICLOFENAC SODIUM 1 % TD GEL: 2.0000 g | Freq: Four times a day (QID) | TRANSDERMAL | Status: DC | PRN

## 2020-08-20 MED ORDER — SENNA 8.6 MG PO TABS
1.0000 | ORAL_TABLET | Freq: Two times a day (BID) | ORAL | Status: DC
  Administered 2020-08-20 – 2020-08-22 (×4): 8.6 mg via ORAL
  Filled 2020-08-20 (×4): qty 1

## 2020-08-20 MED ORDER — DILTIAZEM HCL ER BEADS 120 MG PO CP24
120.0000 mg | ORAL_CAPSULE | Freq: Every day | ORAL | Status: AC
  Administered 2020-08-21: 120 mg via ORAL
  Filled 2020-08-20 (×3): qty 1

## 2020-08-20 MED ORDER — OLOPATADINE HCL 0.1 % OP SOLN
1.0000 [drp] | Freq: Every day | OPHTHALMIC | Status: DC | PRN
  Filled 2020-08-20: qty 5

## 2020-08-20 MED ORDER — POLYETHYLENE GLYCOL 3350 17 G PO PACK
17.0000 g | PACK | Freq: Two times a day (BID) | ORAL | Status: DC
  Administered 2020-08-20 – 2020-08-22 (×4): 17 g via ORAL
  Filled 2020-08-20 (×4): qty 1

## 2020-08-20 MED ORDER — NITROGLYCERIN 0.4 MG SL SUBL: 0.4000 mg | SUBLINGUAL_TABLET | SUBLINGUAL | Status: DC | PRN

## 2020-08-20 MED ORDER — ACETAMINOPHEN 325 MG PO TABS
650.0000 mg | ORAL_TABLET | Freq: Four times a day (QID) | ORAL | Status: DC | PRN
  Administered 2020-08-21: 650 mg via ORAL
  Filled 2020-08-20 (×2): qty 2

## 2020-08-20 MED ORDER — GABAPENTIN 300 MG PO CAPS: 300.0000 mg | ORAL_CAPSULE | Freq: Every evening | ORAL | Status: DC | PRN

## 2020-08-20 MED ORDER — LACTATED RINGERS IV SOLN
INTRAVENOUS | Status: AC
  Administered 2020-08-21: 1000 mL via INTRAVENOUS

## 2020-08-20 MED ORDER — HEPARIN SODIUM (PORCINE) 5000 UNIT/ML IJ SOLN
5000.0000 [IU] | Freq: Three times a day (TID) | INTRAMUSCULAR | Status: DC
  Administered 2020-08-21 – 2020-08-22 (×4): 5000 [IU] via SUBCUTANEOUS
  Filled 2020-08-20 (×4): qty 1

## 2020-08-20 MED ORDER — OLOPATADINE HCL 0.7 % OP SOLN: Freq: Every day | OPHTHALMIC | Status: DC | PRN

## 2020-08-20 NOTE — H&P (Signed)
[facility] Admission History and Physical Service Pager: [phone]  Patient name: Leah Pruitt Medical record number: [id number] Date of birth: 12-03-1945 Age: 75 y.o. Gender: female  Primary Care Provider: Pcp, No Consultants: None  Code Status: full Preferred Emergency Contact: Claire Shown (spouse) [phone]  Chief Complaint: LLQ pain   Assessment and Plan: Leah Pruitt is a 75 y.o. female presenting with LLQ pain x roughly 10 days. PMH is significant for chronic constipation, diverticulosis, HTN, T2DM, osteoarthritis, spinal fusion, GERD, osteoporosis  LLQ pain- hx significant for several possible contributions to abdominal pain including several previous abdominal surgeries, diverticulosis, chronic constipation. She has had this same problem chronically/intermittently for several years. Seen in ED 5/18 for this problem and stated it had been present 2-3 days at that time. Her vital signs were stable and initial labs unremarkable but she eloped prior to further workup. Per patient report, 5/23 PCP prescribed ciprofloxacin for presumed diverticulitis as she has had this in the past. CT abdomen today negative for acute infection. Vital signs are stable with exception of mild tachycardia which appears to be chronic. On exam- Patient is comfortable in appearance with tenderness to deep palpation to LLQ abdomen and left flank- negative for distension, guarding, mass, hernia. She denies current pain, nausea, dysuria. Considering pain is radiating to left flank, has microscopic hematuria and signs of possible upper urinary tract obstruction, consider a passed renal stone as contributing. Patient is post menopausal. Last BM was yesterday. Home bowel regimen is milk of magnesia daily. Does not appear to be UTD on colonoscopy. In the ED, received 1L NS, 4mg  morphine, zofran. LA 2.2>2.1. - admit to med-surg, attending Dr. Owens Shark - scheduled bowel regimen of miralax  + senna and PRN enema - strict I/O - confirm if had recent colonoscopy and consider diagnostic - avoid home narcotic pain regimen as this can be contributing to her constipation. Tylenol prn  - repeat LA  AKI- Cr on admission 1.71 (from normal baseline ~0.8). CT abdomen was significant for bilateral prominent renal collecting systems suggestive of upper urinary tract obstruction but no stones were seen. Comparing this to previous abdominal CT 2017 which had normal appearing kidneys. Urology was consulted in ED for signs of urinary obstruction and will follow peripherally. Radiology, per ED signout, had suggested obstruction of ureter from constipation could be contributing to the changes seen on CT and will be treated as listed above. S/p 1L NS boluses in ED. Post void residual 134cc. Negative for CVA or suprapubic tenderness on exam. Without active signs of infection and ciprofloxacin having a possible contribution to AKI, will discontinue at this time. - slightly greater than maintenance IV fluids x12 hours and liberalize PO water intake.  - repeat BMP am - monitor for fluid overload. No known history of HF but does take lasix for chronic LE swelling. (holding lasix currently) - avoid nephrotoxic agents - repeat urinalysis with urology to evaluate for resolution of hematuria   HTN- home medications include diltiazem, furosemide. BP 113-154/71-107 on admission. - continue diltiazem - holding lasix while hydrating  T2DM- non- insulin dependent. Home meds include: glumetza 500mg  TID. No A1c in chart. Glucose on presentation 81 - holding home metformin  - monitor glucose on am BMP - hgb A1c am  Chronic lumbar back pain- has several different narcotic pain medications listed. Holding until can be confirmed by pharmacy. Likely this is contributing to her constipation problems. - tylenol, voltaren gel, k-pad, early ambulation  FEN/GI: normal diet  Prophylaxis: heparin subq   Disposition:  Med-Surg   History of Present Illness:  Leah Pruitt is a 75 y.o. female presenting with LLQ pain which is similar to previous complaints intermittently for several years. She was evaluated in triage 5/18 but eloped prior to full workup. Her PCP prescribed ciprofloxacin 5/23 for presumed diverticulitis which was not reflected by abdominal CT today so discontinued antibiotics. Patient describes the pain as being similar to previous episodes and presently worsening over the past 10 days. She describes a colicky type pain in LLQ abdomen which radiates to left flank. Denies any dysuria, frequency, gross blood in urine. Pain is associated with nausea but no vomiting during this episode. She suffers from chronic constipation and takes daily fiber supplements and milk of magnesia. Her last BM was yesterday without blood. Patient takes several pain medications for her chronic knee and lumbar back pain. Currently, patient denies pain at rest or N/V. Has no recent illness, fever, chills, change in appetite. She is hungry and would like to eat now.  Endorses remote hx of symptomatic tachycardia but no known heart failure. She denies SOB, recent leg swelling or orthopnea. Walks independently at baseline without DOE.  Interview was not conducted with a medical interpretor as both patient and husband declined. Husband relayed information to and from patient, per her request.  Review Of Systems: Per HPI with the following additions:   Review of Systems  Constitutional: Positive for fatigue. Negative for activity change, appetite change, chills and fever.  Respiratory: Negative for cough, chest tightness and shortness of breath.   Cardiovascular: Negative for chest pain, palpitations and leg swelling.  Gastrointestinal: Positive for abdominal pain, constipation and nausea. Negative for anal bleeding, blood in stool, diarrhea and vomiting.  Endocrine: Negative for polyuria.  Genitourinary: Positive for flank pain.  Negative for difficulty urinating, dysuria and frequency.  Musculoskeletal: Positive for arthralgias and back pain.     Patient Active Problem List   Diagnosis Date Noted  . AKI (acute kidney injury) ([place]) 08/20/2020  . Unilateral primary osteoarthritis, left knee 10/09/2017  . Colles' fracture of right radius, init for clos fx 07/09/2017  . Pain in right wrist 07/09/2017  . Abdominal pain   . Left lower quadrant pain   . Mural thickening of colon   . Chest pain at rest 01/02/2013  . Palpitations 01/02/2013  . Appendicitis, acute 11/19/2012  . Unspecified essential hypertension 11/19/2012  . GERD (gastroesophageal reflux disease) 11/19/2012    Past Medical History: Past Medical History:  Diagnosis Date  . Appendicitis, acute 11/19/2012  . Back pain   . Diabetes ([place])   . FH: cholecystectomy   . Gastroesophageal reflux disease   . GERD (gastroesophageal reflux disease) 11/19/2012  . Headache   . HTN (hypertension)   . Lipoma    Scalp  . Migraines   . Osteoporosis   . Unspecified essential hypertension 11/19/2012    Past Surgical History: Past Surgical History:  Procedure Laterality Date  . CESAREAN SECTION    . CHOLECYSTECTOMY    . LAPAROSCOPIC APPENDECTOMY N/A 11/16/2012   Procedure: APPENDECTOMY LAPAROSCOPIC;  Surgeon: Imogene Burn. Georgette Dover, MD;  Location: [facility];  Service: General;  Laterality: N/A;  . LAPAROSCOPIC LYSIS OF ADHESIONS Bilateral 11/16/2012   Procedure: LAPAROSCOPIC LYSIS OF ADHESIONS;  Surgeon: Imogene Burn. Georgette Dover, MD;  Location: [facility];  Service: General;  Laterality: Bilateral;  . TONSILLECTOMY      Social History: Social History   Tobacco Use  . Smoking status: Never  Smoker  . Smokeless tobacco: Never Used  Vaping Use  . Vaping Use: Never used  Substance Use Topics  . Alcohol use: No  . Drug use: No   Additional social history: lives with husband. Spanish speaking only. Please also refer to relevant sections of EMR.  Family History: Family  History  Problem Relation Age of Onset  . Stroke Mother 85       deceased  . Hypertension Mother   . Diabetes Mellitus I Mother   . Lung cancer Father 33       deceased  . Hypertension Sister     Allergies and Medications: Allergies  Allergen Reactions  . Naratriptan Anaphylaxis    Rash, difficult time breathing and swallowing  . Penicillins Other (See Comments)    Numbness of mouth and dry mouth  . Iodine Swelling  . Ciprofloxacin     Other reaction(s): Other (See Comments) Tendon pain  . Penicillin G Rash   No current facility-administered medications on file prior to encounter.   Current Outpatient Medications on File Prior to Encounter  Medication Sig Dispense Refill  . Calcium Carbonate-Vitamin D 600-200 MG-UNIT TABS Take 1 tablet by mouth 2 (two) times daily with a meal.    . CARAFATE 1 g tablet Take 1 g by mouth 2 (two) times daily.    . Cholecalciferol 25 MCG (1000 UT) tablet Take 2,000 Units by mouth daily.    . ciprofloxacin (CIPRO) 500 MG tablet Take 500 mg by mouth See admin instructions. Bid x 7 days    . diclofenac (VOLTAREN) 25 MG EC tablet Take 25 mg by mouth daily as needed for mild pain.    Marland Kitchen diltiazem (TIAZAC) 120 MG 24 hr capsule Do not take till you talk with your Medical doctor about her blood pressure (Patient taking differently: Take 120 mg by mouth daily. Do not take till you talk with your Medical doctor about her blood pressure)    . furosemide (LASIX) 20 MG tablet Take 20 mg by mouth daily as needed for fluid.  4  . GLUMETZA 500 MG 24 hr tablet Take 500 mg by mouth 3 (three) times daily.    . magnesium hydroxide (MILK OF MAGNESIA) 800 MG/5ML suspension Take 30 mLs by mouth daily.    . meclizine (ANTIVERT) 25 MG tablet Take 25 mg by mouth daily as needed for dizziness.    . nitroGLYCERIN (NITROSTAT) 0.4 MG SL tablet Place 0.4 mg under the tongue every 5 (five) minutes as needed for chest pain.    Marland Kitchen olmesartan (BENICAR) 20 MG tablet Do not take this  till you talk with your Medical doctor. (Patient taking differently: Take 20 mg by mouth in the morning and at bedtime.)    . oxyCODONE (OXY IR/ROXICODONE) 5 MG immediate release tablet Take 5 mg by mouth every 8 (eight) hours as needed.    . promethazine (PHENERGAN) 25 MG tablet Take 25 mg by mouth every 8 (eight) hours as needed for nausea or vomiting.    . RABEprazole Sodium (ACIPHEX PO) Take 20 mg by mouth in the morning and at bedtime.    . traMADol (ULTRAM) 50 MG tablet Take 1-2 tablets (50-100 mg total) by mouth 3 (three) times daily as needed. (Patient taking differently: Take 50-100 mg by mouth 3 (three) times daily as needed for moderate pain.) 50 tablet 0  . diclofenac sodium (VOLTAREN) 1 % GEL Apply 2-4 g topically 4 (four) times daily. (Patient not taking: No sig reported) 300  g 1  . gabapentin (NEURONTIN) 300 MG capsule Take 1 capsule (300 mg total) by mouth 3 (three) times daily. (Patient not taking: No sig reported) 60 capsule 3  . HYDROcodone-acetaminophen (NORCO/VICODIN) 5-325 MG tablet Take 1-2 tablets by mouth every 6 (six) hours as needed for moderate pain. (Patient not taking: No sig reported) 30 tablet 0  . metroNIDAZOLE (FLAGYL) 500 MG tablet Take 500 mg by mouth See admin instructions. Tid x 14 days (Patient not taking: No sig reported)    . oxyCODONE (OXY IR/ROXICODONE) 5 MG immediate release tablet Take 5 mg by mouth every 8 (eight) hours as needed. for pain (Patient not taking: No sig reported)  0  . oxyCODONE-acetaminophen (PERCOCET) 10-325 MG per tablet TK 1 T PO  Q 8 H PRN P (Patient not taking: No sig reported)  0  . tiZANidine (ZANAFLEX) 4 MG tablet TAKE 1 TABLET BY MOUTH EVERY 6 HOURS AS NEEDED MUSCLE SPASMS (Patient not taking: No sig reported) 30 tablet 0  . tiZANidine (ZANAFLEX) 4 MG tablet TAKE 1 TABLET BY MOUTH EVERY 6 HOURS AS NEEDED FOR MUSCLE SPASMS (Patient not taking: No sig reported) 30 tablet 0    Objective: BP 124/80   Pulse (!) 108   Temp 97.8 F  (36.6 C) (Oral)   Resp 16   SpO2 96%  Exam: Physical Exam Vitals and nursing note reviewed. Exam conducted with a chaperone present.  Constitutional:      General: She is not in acute distress.    Appearance: Normal appearance. She is not ill-appearing, toxic-appearing or diaphoretic.  HENT:     Head: Normocephalic.     Nose: Nose normal.     Mouth/Throat:     Mouth: Mucous membranes are moist.  Cardiovascular:     Rate and Rhythm: Normal rate and regular rhythm.     Pulses: Normal pulses.     Heart sounds: Murmur (systolic ejection) heard.    Pulmonary:     Effort: Pulmonary effort is normal. No respiratory distress.     Breath sounds: Normal breath sounds. No wheezing, rhonchi or rales.  Abdominal:     General: Bowel sounds are normal. There is distension (mild).     Palpations: Abdomen is soft. There is no mass.     Tenderness: There is abdominal tenderness (LLQ). There is no right CVA tenderness, left CVA tenderness, guarding or rebound.     Hernia: No hernia is present.  Musculoskeletal:        General: Swelling (symmetric bilateral LE. assymetric hair distribution on LEs. warm with strong pulse. non-pitting edema in ankles. ) present. No tenderness.  Skin:    General: Skin is warm and dry.     Capillary Refill: Capillary refill takes 2 to 3 seconds.     Findings: No erythema or rash.     Comments: Positive for tenting  Neurological:     General: No focal deficit present.     Mental Status: She is alert.  Psychiatric:        Mood and Affect: Mood normal.        Behavior: Behavior normal.    Labs and Imaging: CBC BMET  Recent Labs  Lab 08/20/20 1151  WBC 6.7  HGB 11.6*  HCT 36.2  PLT 243   Recent Labs  Lab 08/20/20 1151  NA 137  K 4.1  CL 102  CO2 27  BUN 19  CREATININE 1.71*  GLUCOSE 81  CALCIUM 10.2     EKG: not  performed. Will order   CT ABDOMEN PELVIS WO CONTRAST  Result Date: 08/20/2020 CLINICAL DATA:  Left lower quadrant pain, history of  diverticulitis. EXAM: CT ABDOMEN AND PELVIS WITHOUT CONTRAST TECHNIQUE: Multidetector CT imaging of the abdomen and pelvis was performed following the standard protocol without IV contrast. COMPARISON:  06/28/2019 FINDINGS: Lower chest: No acute abnormality. Hepatobiliary: No focal liver abnormality is seen. Status post cholecystectomy. No biliary dilatation. Pancreas: Unremarkable. No pancreatic ductal dilatation or surrounding inflammatory changes. Spleen: Normal in size without focal abnormality. Adrenals/Urinary Tract: Adrenal glands are within normal limits. Kidneys are well visualized bilaterally. Punctate nonobstructing right renal stone is noted. Mildly prominent extrarenal pelvis is noted. Prominence of the right ureter is seen to the level of the iliac artery without definitive obstructive change or calculus. This may be related to some extrinsic compression by the iliac artery. Prominent extrarenal pelvis and hydronephrosis is noted on the left. This is new from the prior exam although no ureteral obstructive changes are seen. Bladder is well distended. Stomach/Bowel: The appendix is not well visualized consistent with a prior surgical history. No obstructive or inflammatory changes of the large or small bowel are seen. Mild diverticular changes noted. Stomach is within normal limits. Vascular/Lymphatic: Aortic atherosclerosis. No enlarged abdominal or pelvic lymph nodes. Reproductive: Uterus and bilateral adnexa are unremarkable. Other: No abdominal wall hernia or abnormality. No abdominopelvic ascites. Musculoskeletal: Mild degenerative changes of the lumbar spine are noted with anterolisthesis of L5 on S1. IMPRESSION: Status post cholecystectomy and appendectomy. Bilateral prominent renal collecting systems without definitive obstructing stone. These changes on the right are likely related to crossing over iliac artery as the more distal ureter is within normal limits. The changes on the left may be  related to a degree of UPJ obstruction. Electronically Signed   By: Inez Catalina M.D.   On: 08/20/2020 17:00    Richarda Osmond, DO 08/20/2020, 10:53 PM PGY-3, [facility] pager: [phone], text pages welcome

## 2020-08-20 NOTE — ED Notes (Signed)
Pt refused venipuncture blood draw. Pt wants labs pulled from IV line.- KS

## 2020-08-20 NOTE — ED Provider Notes (Signed)
[facility] EMERGENCY DEPARTMENT Provider Note   CSN: [id number] Arrival date & time: 08/20/20  1137     History No chief complaint on file.   Leah Pruitt is a 75 y.o. female presents to the Emergency Department complaining of gradual, persistent, progressively worsening left-sided left lower quadrant and left low back pain onset initially 5 days ago.  Patient reports pain has gradually worsened.  Initially it was only left-sided abdominal pain but has begun radiating to the back in the last 2 days or so.  Patient reports she has a longstanding history of diverticulitis.  She called her doctor on Monday and he prescribed ciprofloxacin.  She reports that she can take name brand Flagyl but not generic as it causes nausea and vomiting for her.  They have been unable to find the name brand Flagyl therefore she has only been taking ciprofloxacin.  She reports this pain is more severe than previous.  She has had fever and chills for the last 2 days with severe nausea and intermittent vomiting.  She denies diarrhea, melena, hematochezia.  She reports emesis contained stomach contents and is nonbloody nonbilious.  Previous abdominal surgeries include appendectomy, cholecystectomy and cesarean section x2.  No specific aggravating or relieving factors.  Patient denies urinary or vaginal symptoms.  Patient reports chronic constipation and states she has been taking magnesium citrate for this.  She reports she does have 1 small bowel movement every day.  The history is provided by the patient, medical records and the spouse. The history is limited by a language barrier. A language interpreter was used.       Past Medical History:  Diagnosis Date  . Appendicitis, acute 11/19/2012  . Back pain   . Diabetes ([place])   . FH: cholecystectomy   . Gastroesophageal reflux disease   . GERD (gastroesophageal reflux disease) 11/19/2012  . Headache   . HTN (hypertension)   . Lipoma    Scalp  .  Migraines   . Osteoporosis   . Unspecified essential hypertension 11/19/2012    Patient Active Problem List   Diagnosis Date Noted  . Unilateral primary osteoarthritis, left knee 10/09/2017  . Colles' fracture of right radius, init for clos fx 07/09/2017  . Pain in right wrist 07/09/2017  . Abdominal pain   . Left lower quadrant pain   . Mural thickening of colon   . Chest pain at rest 01/02/2013  . Palpitations 01/02/2013  . Appendicitis, acute 11/19/2012  . Unspecified essential hypertension 11/19/2012  . GERD (gastroesophageal reflux disease) 11/19/2012    Past Surgical History:  Procedure Laterality Date  . CESAREAN SECTION    . CHOLECYSTECTOMY    . LAPAROSCOPIC APPENDECTOMY N/A 11/16/2012   Procedure: APPENDECTOMY LAPAROSCOPIC;  Surgeon: Imogene Burn. Georgette Dover, MD;  Location: [facility];  Service: General;  Laterality: N/A;  . LAPAROSCOPIC LYSIS OF ADHESIONS Bilateral 11/16/2012   Procedure: LAPAROSCOPIC LYSIS OF ADHESIONS;  Surgeon: Imogene Burn. Georgette Dover, MD;  Location: [facility];  Service: General;  Laterality: Bilateral;  . TONSILLECTOMY       OB History   No obstetric history on file.     Family History  Problem Relation Age of Onset  . Stroke Mother 71       deceased  . Hypertension Mother   . Diabetes Mellitus I Mother   . Lung cancer Father 79       deceased  . Hypertension Sister     Social History   Tobacco Use  .  Smoking status: Never Smoker  . Smokeless tobacco: Never Used  Vaping Use  . Vaping Use: Never used  Substance Use Topics  . Alcohol use: No  . Drug use: No    Home Medications Prior to Admission medications   Medication Sig Start Date End Date Taking? Authorizing Provider  ACIPHEX 20 MG tablet TK 1 T PO BID 09/10/14   [provider]  ciprofloxacin (CIPRO) 500 MG tablet Take 1 tablet (500 mg total) by mouth 2 (two) times daily. 06/27/15   Margaretann Loveless, MD  diclofenac (VOLTAREN) 25 MG EC tablet Take 25 mg by mouth daily.    [provider]  diclofenac sodium (VOLTAREN) 1 % GEL Apply 2-4 g topically 4 (four) times daily. 09/06/17   Pete Pelt, PA-C  diltiazem (TIAZAC) 120 MG 24 hr capsule Do not take till you talk with your Medical doctor about her blood pressure Patient taking differently: Take 120 mg by mouth daily. Do not take till you talk with your Medical doctor about her blood pressure 11/17/12   Earnstine Regal, PA-C  furosemide (LASIX) 20 MG tablet TK 1 T PO QD IN THE MORNING 09/19/14   [provider]  gabapentin (NEURONTIN) 300 MG capsule Take 1 capsule (300 mg total) by mouth 3 (three) times daily. 09/20/17   Mcarthur Rossetti, MD  HYDROcodone-acetaminophen (NORCO/VICODIN) 5-325 MG tablet Take 1-2 tablets by mouth every 6 (six) hours as needed for moderate pain. 07/09/17   Mcarthur Rossetti, MD  magnesium hydroxide (MILK OF MAGNESIA) 800 MG/5ML suspension Take 5 mLs by mouth daily as needed for constipation.    [provider]  metFORMIN (GLUCOPHAGE) 500 MG tablet Take 500 mg by mouth 3 (three) times daily.    [provider]  metroNIDAZOLE (FLAGYL) 500 MG tablet Take 1 tablet (500 mg total) by mouth 3 (three) times daily. 06/27/15   Margaretann Loveless, MD  nitroGLYCERIN (NITROSTAT) 0.4 MG SL tablet Place 0.4 mg under the tongue every 5 (five) minutes as needed.    [provider]  nystatin cream (MYCOSTATIN) APP AA BID 09/19/14   [provider]  olmesartan (BENICAR) 20 MG tablet Do not take this till you talk with your Medical doctor. Patient taking differently: Take 20 mg by mouth daily. Do not take this till you talk with your Medical doctor. 11/17/12   Earnstine Regal, PA-C  oxyCODONE (OXY IR/ROXICODONE) 5 MG immediate release tablet Take 5 mg by mouth every 8 (eight) hours as needed. for pain 09/25/14   [provider]  oxyCODONE-acetaminophen (PERCOCET) 10-325 MG per tablet TK 1 T PO  Q 8 H PRN P 09/19/14   [provider]  PAZEO 0.7 % SOLN INSTILL  1 DROP INTO BOTH EYES QD PRN 09/19/14   [provider]  RABEprazole Sodium (ACIPHEX PO) Take 20 mg by mouth daily.    [provider]  tiZANidine (ZANAFLEX) 4 MG tablet TAKE 1 TABLET BY MOUTH EVERY 6 HOURS AS NEEDED MUSCLE SPASMS 09/11/16   Mcarthur Rossetti, MD  tiZANidine (ZANAFLEX) 4 MG tablet TAKE 1 TABLET BY MOUTH EVERY 6 HOURS AS NEEDED FOR MUSCLE SPASMS 09/25/16   Pete Pelt, PA-C  traMADol (ULTRAM) 50 MG tablet Take 1-2 tablets (50-100 mg total) by mouth 3 (three) times daily as needed. 12/18/17   Mcarthur Rossetti, MD    Allergies    Naratriptan, Penicillins, Iodine, and Penicillin g  Review of Systems   Review of Systems  Constitutional: Positive  for chills and fever. Negative for appetite change, diaphoresis, fatigue and unexpected weight change.  HENT: Negative for mouth sores.   Eyes: Negative for visual disturbance.  Respiratory: Negative for cough, chest tightness, shortness of breath and wheezing.   Cardiovascular: Negative for chest pain.  Gastrointestinal: Positive for abdominal pain, nausea and vomiting. Negative for constipation and diarrhea.  Endocrine: Negative for polydipsia, polyphagia and polyuria.  Genitourinary: Negative for dysuria, frequency, hematuria and urgency.  Musculoskeletal: Positive for back pain. Negative for neck stiffness.  Skin: Negative for rash.  Allergic/Immunologic: Negative for immunocompromised state.  Neurological: Negative for syncope, light-headedness and headaches.  Hematological: Does not bruise/bleed easily.  Psychiatric/Behavioral: Negative for sleep disturbance. The patient is not nervous/anxious.     Physical Exam Updated Vital Signs BP 137/90   Pulse (!) 103   Temp 97.8 F (36.6 C) (Oral)   Resp 17   SpO2 100%   Physical Exam Vitals and nursing note reviewed.  Constitutional:      General: She is not in acute distress.    Appearance: She is not diaphoretic.  HENT:     Head:  Normocephalic.  Eyes:     General: No scleral icterus.    Conjunctiva/sclera: Conjunctivae normal.  Cardiovascular:     Rate and Rhythm: Regular rhythm. Tachycardia present.     Pulses: Normal pulses.          Radial pulses are 2+ on the right side and 2+ on the left side.       Dorsalis pedis pulses are 2+ on the right side and 2+ on the left side.  Pulmonary:     Effort: No tachypnea, accessory muscle usage, prolonged expiration, respiratory distress or retractions.     Breath sounds: Normal breath sounds. No stridor.     Comments: Equal chest rise. No increased work of breathing. Abdominal:     General: Bowel sounds are normal. There is no distension.     Palpations: Abdomen is soft.     Tenderness: There is abdominal tenderness in the epigastric area, left upper quadrant and left lower quadrant. There is left CVA tenderness. There is no right CVA tenderness, guarding or rebound.  Musculoskeletal:     Cervical back: Normal range of motion.     Right lower leg: 1+ Edema present.     Left lower leg: 1+ Edema present.     Comments: Moves all extremities equally and without difficulty.  Skin:    General: Skin is warm and dry.     Capillary Refill: Capillary refill takes less than 2 seconds.  Neurological:     Mental Status: She is alert.     GCS: GCS eye subscore is 4. GCS verbal subscore is 5. GCS motor subscore is 6.     Comments: Speech is clear and goal oriented.  Psychiatric:        Mood and Affect: Mood normal.     ED Results / Procedures / Treatments   Labs (all labs ordered are listed, but only abnormal results are displayed) Labs Reviewed  COMPREHENSIVE METABOLIC PANEL - Abnormal; Notable for the following components:      Result Value   Creatinine, Ser 1.71 (*)    Alkaline Phosphatase 208 (*)    GFR, Estimated 31 (*)    All other components within normal limits  CBC - Abnormal; Notable for the following components:   Hemoglobin 11.6 (*)    All other components  within normal limits  URINALYSIS, ROUTINE W REFLEX MICROSCOPIC -  Abnormal; Notable for the following components:   Hgb urine dipstick SMALL (*)    Bacteria, UA RARE (*)    All other components within normal limits  LACTIC ACID, PLASMA - Abnormal; Notable for the following components:   Lactic Acid, Venous 2.2 (*)    All other components within normal limits  LACTIC ACID, PLASMA - Abnormal; Notable for the following components:   Lactic Acid, Venous 2.1 (*)    All other components within normal limits  SARS CORONAVIRUS 2 (TAT 6-24 HRS)  LIPASE, BLOOD     Radiology CT ABDOMEN PELVIS WO CONTRAST  Result Date: 08/20/2020 CLINICAL DATA:  Left lower quadrant pain, history of diverticulitis. EXAM: CT ABDOMEN AND PELVIS WITHOUT CONTRAST TECHNIQUE: Multidetector CT imaging of the abdomen and pelvis was performed following the standard protocol without IV contrast. COMPARISON:  06/28/2019 FINDINGS: Lower chest: No acute abnormality. Hepatobiliary: No focal liver abnormality is seen. Status post cholecystectomy. No biliary dilatation. Pancreas: Unremarkable. No pancreatic ductal dilatation or surrounding inflammatory changes. Spleen: Normal in size without focal abnormality. Adrenals/Urinary Tract: Adrenal glands are within normal limits. Kidneys are well visualized bilaterally. Punctate nonobstructing right renal stone is noted. Mildly prominent extrarenal pelvis is noted. Prominence of the right ureter is seen to the level of the iliac artery without definitive obstructive change or calculus. This may be related to some extrinsic compression by the iliac artery. Prominent extrarenal pelvis and hydronephrosis is noted on the left. This is new from the prior exam although no ureteral obstructive changes are seen. Bladder is well distended. Stomach/Bowel: The appendix is not well visualized consistent with a prior surgical history. No obstructive or inflammatory changes of the large or small bowel are seen.  Mild diverticular changes noted. Stomach is within normal limits. Vascular/Lymphatic: Aortic atherosclerosis. No enlarged abdominal or pelvic lymph nodes. Reproductive: Uterus and bilateral adnexa are unremarkable. Other: No abdominal wall hernia or abnormality. No abdominopelvic ascites. Musculoskeletal: Mild degenerative changes of the lumbar spine are noted with anterolisthesis of L5 on S1. IMPRESSION: Status post cholecystectomy and appendectomy. Bilateral prominent renal collecting systems without definitive obstructing stone. These changes on the right are likely related to crossing over iliac artery as the more distal ureter is within normal limits. The changes on the left may be related to a degree of UPJ obstruction. Electronically Signed   By: Inez Catalina M.D.   On: 08/20/2020 17:00    Procedures Procedures   Medications Ordered in ED Medications  sodium chloride 0.9 % bolus 500 mL (0 mLs Intravenous Stopped 08/20/20 1647)  morphine 4 MG/ML injection 4 mg (4 mg Intravenous Given 08/20/20 1649)  ondansetron (ZOFRAN) injection 4 mg (4 mg Intravenous Given 08/20/20 1649)  sodium chloride 0.9 % bolus 500 mL (500 mLs Intravenous New Bag/Given 08/20/20 1917)    ED Course  I have reviewed the triage vital signs and the nursing notes.  Pertinent labs & imaging results that were available during my care of the patient were reviewed by me and considered in my medical decision making (see chart for details).  Clinical Course as of 08/20/20 1945  Fri Aug 20, 2020  1512 Creatinine(!): 1.71 Elevated from baseline [HM]  1512 Hemoglobin(!): 11.6 baseline [HM]  1512 Pulse Rate(!): 105 tachycardia [HM]    Clinical Course User Index [HM] Devanie Galanti, Gwenlyn Perking   MDM Rules/Calculators/A&P                           Patient presents  with left lower quadrant abdominal pain, history of diverticulitis.  Well-appearing on exam but abdomen tender with mild guarding.  Labs overall reassuring however  patient does have an AKI.  Question dehydration versus kidney injury from ciprofloxacin.  Will give fluids.  Afebrile here in the emergency department but tachycardic.  Pain control and nausea medication ordered.  Will obtain CT scan to evaluate for possible abscess, perforation or other complication.  5:45 PM Patient continues to have some pain and nausea.  Her tachycardia persist.  She was given 500 mL of fluid.  CT scan with left hydronephrosis.  Will discuss with urology as there is no clear reason for obstruction.  6:30 PM Discussion with radiology who reviewed the images with me.  No clear evidence of renal stone on the left.  No specific evidence of mass creating UPJ obstruction.  Some potential has chronic constipation is causing incomplete bladder emptying/distention and in turn causing hydronephrosis.  Today patient with AKI new from previous.  Also slightly elevated lactic acid.  Patient has been given fluids.  We will continue to give fluids.  Afebrile here in the emergency department. No leukocytosis to suggest other site of infection.  No other infectious symptoms.  Suspect some element of dehydration and urinary retention.  Bladder scan 7:10 PM: Prevoid: 692 Post void: 134  7:44 PM Discussed with and admitted to the teaching service.   Final Clinical Impression(s) / ED Diagnoses Final diagnoses:  AKI (acute kidney injury) ([place])  Hydronephrosis with ureteropelvic junction (UPJ) obstruction  Constipation, unspecified constipation type    Rx / DC Orders ED Discharge Orders    None       Loni Muse Gwenlyn Perking 08/20/20 1946    Lucrezia Starch, MD 08/20/20 [phone]

## 2020-08-20 NOTE — ED Provider Notes (Signed)
Emergency Medicine Provider Triage Evaluation Note  Leah Pruitt , a 75 y.o. female  was evaluated in triage.  Pt complains of abdominal pain and vomiting   Review of Systems  Positive: nausea Negative: No diarrhea  Physical Exam  BP (!) 154/107 (BP Location: Left Arm)   Pulse (!) 102   Temp 98.2 F (36.8 C)   Resp 18   SpO2 100%  Gen:   Awake, no distress   Resp:  Normal effort  MSK:   Moves extremities without difficulty  Other:    Medical Decision Making  Medically screening exam initiated at 11:47 AM.  Appropriate orders placed.  Leah Pruitt was informed that the remainder of the evaluation will be completed by another provider, this initial triage assessment does not replace that evaluation, and the importance of remaining in the ED until their evaluation is complete.     Fransico Meadow, [place] 08/20/20 1148    Varney Biles, MD 08/23/20 1553

## 2020-08-20 NOTE — ED Notes (Signed)
Family refused blood draw at this time.

## 2020-08-21 DIAGNOSIS — Z8249 Family history of ischemic heart disease and other diseases of the circulatory system: Secondary | ICD-10-CM | POA: Diagnosis not present

## 2020-08-21 DIAGNOSIS — Q6211 Congenital occlusion of ureteropelvic junction: Secondary | ICD-10-CM | POA: Diagnosis not present

## 2020-08-21 DIAGNOSIS — Z9049 Acquired absence of other specified parts of digestive tract: Secondary | ICD-10-CM | POA: Diagnosis not present

## 2020-08-21 DIAGNOSIS — N179 Acute kidney failure, unspecified: Principal | ICD-10-CM

## 2020-08-21 DIAGNOSIS — Z881 Allergy status to other antibiotic agents status: Secondary | ICD-10-CM | POA: Diagnosis not present

## 2020-08-21 DIAGNOSIS — E1122 Type 2 diabetes mellitus with diabetic chronic kidney disease: Secondary | ICD-10-CM | POA: Diagnosis present

## 2020-08-21 DIAGNOSIS — R3129 Other microscopic hematuria: Secondary | ICD-10-CM | POA: Diagnosis present

## 2020-08-21 DIAGNOSIS — Z7984 Long term (current) use of oral hypoglycemic drugs: Secondary | ICD-10-CM | POA: Diagnosis not present

## 2020-08-21 DIAGNOSIS — G8929 Other chronic pain: Secondary | ICD-10-CM | POA: Diagnosis present

## 2020-08-21 DIAGNOSIS — M1990 Unspecified osteoarthritis, unspecified site: Secondary | ICD-10-CM | POA: Diagnosis present

## 2020-08-21 DIAGNOSIS — R1032 Left lower quadrant pain: Secondary | ICD-10-CM | POA: Diagnosis present

## 2020-08-21 DIAGNOSIS — Z823 Family history of stroke: Secondary | ICD-10-CM | POA: Diagnosis not present

## 2020-08-21 DIAGNOSIS — Z880 Allergy status to penicillin: Secondary | ICD-10-CM | POA: Diagnosis not present

## 2020-08-21 DIAGNOSIS — Z833 Family history of diabetes mellitus: Secondary | ICD-10-CM | POA: Diagnosis not present

## 2020-08-21 DIAGNOSIS — N132 Hydronephrosis with renal and ureteral calculous obstruction: Secondary | ICD-10-CM | POA: Diagnosis present

## 2020-08-21 DIAGNOSIS — K5900 Constipation, unspecified: Secondary | ICD-10-CM | POA: Diagnosis not present

## 2020-08-21 DIAGNOSIS — Z888 Allergy status to other drugs, medicaments and biological substances status: Secondary | ICD-10-CM | POA: Diagnosis not present

## 2020-08-21 DIAGNOSIS — K5909 Other constipation: Secondary | ICD-10-CM | POA: Diagnosis present

## 2020-08-21 DIAGNOSIS — I10 Essential (primary) hypertension: Secondary | ICD-10-CM | POA: Diagnosis present

## 2020-08-21 DIAGNOSIS — Z20822 Contact with and (suspected) exposure to covid-19: Secondary | ICD-10-CM | POA: Diagnosis present

## 2020-08-21 DIAGNOSIS — M810 Age-related osteoporosis without current pathological fracture: Secondary | ICD-10-CM | POA: Diagnosis present

## 2020-08-21 DIAGNOSIS — K219 Gastro-esophageal reflux disease without esophagitis: Secondary | ICD-10-CM | POA: Diagnosis present

## 2020-08-21 DIAGNOSIS — Z981 Arthrodesis status: Secondary | ICD-10-CM | POA: Diagnosis not present

## 2020-08-21 DIAGNOSIS — M545 Low back pain, unspecified: Secondary | ICD-10-CM | POA: Diagnosis present

## 2020-08-21 DIAGNOSIS — E785 Hyperlipidemia, unspecified: Secondary | ICD-10-CM | POA: Diagnosis present

## 2020-08-21 DIAGNOSIS — N1831 Chronic kidney disease, stage 3a: Secondary | ICD-10-CM | POA: Diagnosis present

## 2020-08-21 LAB — CBC
HCT: 33.7 % — ABNORMAL LOW (ref 36.0–46.0)
Hemoglobin: 10.9 g/dL — ABNORMAL LOW (ref 12.0–15.0)
MCH: 28.9 pg (ref 26.0–34.0)
MCHC: 32.3 g/dL (ref 30.0–36.0)
MCV: 89.4 fL (ref 80.0–100.0)
Platelets: 214 10*3/uL (ref 150–400)
RBC: 3.77 MIL/uL — ABNORMAL LOW (ref 3.87–5.11)
RDW: 14.2 % (ref 11.5–15.5)
WBC: 4.6 10*3/uL (ref 4.0–10.5)
nRBC: 0 % (ref 0.0–0.2)

## 2020-08-21 LAB — GAMMA GT: GGT: 15 U/L (ref 7–50)

## 2020-08-21 LAB — LACTIC ACID, PLASMA: Lactic Acid, Venous: 1.2 mmol/L (ref 0.5–1.9)

## 2020-08-21 LAB — BASIC METABOLIC PANEL WITH GFR
Anion gap: 6 (ref 5–15)
BUN: 20 mg/dL (ref 8–23)
CO2: 26 mmol/L (ref 22–32)
Calcium: 9.3 mg/dL (ref 8.9–10.3)
Chloride: 104 mmol/L (ref 98–111)
Creatinine, Ser: 1.79 mg/dL — ABNORMAL HIGH (ref 0.44–1.00)
GFR, Estimated: 30 mL/min — ABNORMAL LOW
Glucose, Bld: 125 mg/dL — ABNORMAL HIGH (ref 70–99)
Potassium: 4 mmol/L (ref 3.5–5.1)
Sodium: 136 mmol/L (ref 135–145)

## 2020-08-21 LAB — GLUCOSE, CAPILLARY
Glucose-Capillary: 89 mg/dL (ref 70–99)
Glucose-Capillary: 138 mg/dL — ABNORMAL HIGH (ref 70–99)
Glucose-Capillary: 135 mg/dL — ABNORMAL HIGH (ref 70–99)
Glucose-Capillary: 163 mg/dL — ABNORMAL HIGH (ref 70–99)

## 2020-08-21 MED ORDER — INSULIN ASPART 100 UNIT/ML IJ SOLN
0.0000 [IU] | Freq: Three times a day (TID) | INTRAMUSCULAR | Status: DC
  Administered 2020-08-21 – 2020-08-22 (×3): 1 [IU] via SUBCUTANEOUS

## 2020-08-21 MED ORDER — LACTATED RINGERS IV SOLN: INTRAVENOUS | Status: AC

## 2020-08-21 MED ORDER — DILTIAZEM HCL ER COATED BEADS 120 MG PO CP24
120.0000 mg | ORAL_CAPSULE | Freq: Every day | ORAL | Status: DC
  Administered 2020-08-22: 120 mg via ORAL
  Filled 2020-08-21: qty 1

## 2020-08-21 NOTE — ED Notes (Signed)
Patient c/o being hungry , snack given.

## 2020-08-21 NOTE — Progress Notes (Addendum)
Family Medicine Teaching Service Daily Progress Note Intern Pager: [phone]  Patient name: Leah Pruitt Medical record number: [id number] Date of birth: September 21, 1945 Age: 75 y.o. Gender: female  Primary Care Provider: Pcp, No Consultants: urology Code Status: full  Pt Overview and Major Events to Date:  5/27- admitted for AKI  Assessment and Plan: 75yo female with PMH for DM, HTN, chronic constipation, diverticulosis, spinal fusion, osteoporosis, OA presents with flare of chronic LLQ pain and found to have AKI which could be related to constipation vs antibiotic use vs other.   AKI- received 125cc/hr LR overnight. Repeat BMP today is delayed as patient was having difficulty getting blood drawn and declined to get another venipuncture. After speaking with her and her husband this am, she agreed to try again for another IV line. She appears to be euvolemic to still dehydrated on exam.  - f/u urology - monitor fluid status - strict I/O - f/u BMP - amb ref to urology on dc  LLQ pain- patient reports pain is much better today. She had a soft BM during the night. - continue bowel regimen  HTN- chronic, stable. BP normotensive overnight. ECG this am shows NSR. - continue diltiazem - holding lasix while hydrating  T2DM- glucose overnight is again pending due to delayed lab collection.  - holding home metformin  - monitor glucose on BMP  Chronic lumbar back pain- stable. - tylenol, voltaren gel, k-pad, early ambulation  Osteoporosis- alk phos elevated to 208 on admission. Remote H/o alendronate, risedronate, ibandronate use. Calcium supplement on med list. - add on ggt - holding calcium today with AKI  FEN/GI: regular diet PPx: heparin  Status is: Observation  The patient remains OBS appropriate and will d/c before 2 midnights.  Dispo: The patient is from: Home              Anticipated d/c is to: Home              Patient currently is not medically stable to d/c.    Difficult to place patient No   Subjective:  Patient's main complaint today is that she is tired. She got poor sleep in the ED. She sates that her abdominal pain is greatly improved after her BM last night. She is amenable to getting a new IV and requests all labs be drawn from that.   Objective: Temp:  [97.8 F (36.6 C)-98.2 F (36.8 C)] 97.8 F (36.6 C) (05/27 1432) Pulse Rate:  [91-108] 101 (05/28 0400) Resp:  [11-19] 17 (05/28 0400) BP: (105-154)/(67-107) 119/77 (05/28 0400) SpO2:  [94 %-100 %] 97 % (05/28 0400) Physical Exam: Physical Exam Vitals and nursing note reviewed. Exam conducted with a chaperone present.  Constitutional:      General: She is not in acute distress.    Appearance: She is not ill-appearing or toxic-appearing.  Pulmonary:     Effort: Pulmonary effort is normal.     Breath sounds: Normal breath sounds.  Abdominal:     General: Bowel sounds are normal.     Palpations: Abdomen is soft.     Tenderness: There is no abdominal tenderness. There is no guarding or rebound.  Musculoskeletal:        General: Swelling present. No tenderness.  Skin:    General: Skin is warm and dry.     Capillary Refill: Capillary refill takes less than 2 seconds.  Neurological:     General: No focal deficit present.     Mental Status: She is alert.  Psychiatric:        Mood and Affect: Mood normal.    Laboratory: Recent Labs  Lab 08/20/20 1151  WBC 6.7  HGB 11.6*  HCT 36.2  PLT 243   Recent Labs  Lab 08/20/20 1151  NA 137  K 4.1  CL 102  CO2 27  BUN 19  CREATININE 1.71*  CALCIUM 10.2  PROT 6.9  BILITOT 0.6  ALKPHOS 208*  ALT 15  AST 19  GLUCOSE 81   hgb A1c: pending  Imaging/Diagnostic Tests: CT ABDOMEN PELVIS WO CONTRAST  Result Date: 08/20/2020 CLINICAL DATA:  Left lower quadrant pain, history of diverticulitis. EXAM: CT ABDOMEN AND PELVIS WITHOUT CONTRAST TECHNIQUE: Multidetector CT imaging of the abdomen and pelvis was performed following the  standard protocol without IV contrast. COMPARISON:  06/28/2019 FINDINGS: Lower chest: No acute abnormality. Hepatobiliary: No focal liver abnormality is seen. Status post cholecystectomy. No biliary dilatation. Pancreas: Unremarkable. No pancreatic ductal dilatation or surrounding inflammatory changes. Spleen: Normal in size without focal abnormality. Adrenals/Urinary Tract: Adrenal glands are within normal limits. Kidneys are well visualized bilaterally. Punctate nonobstructing right renal stone is noted. Mildly prominent extrarenal pelvis is noted. Prominence of the right ureter is seen to the level of the iliac artery without definitive obstructive change or calculus. This may be related to some extrinsic compression by the iliac artery. Prominent extrarenal pelvis and hydronephrosis is noted on the left. This is new from the prior exam although no ureteral obstructive changes are seen. Bladder is well distended. Stomach/Bowel: The appendix is not well visualized consistent with a prior surgical history. No obstructive or inflammatory changes of the large or small bowel are seen. Mild diverticular changes noted. Stomach is within normal limits. Vascular/Lymphatic: Aortic atherosclerosis. No enlarged abdominal or pelvic lymph nodes. Reproductive: Uterus and bilateral adnexa are unremarkable. Other: No abdominal wall hernia or abnormality. No abdominopelvic ascites. Musculoskeletal: Mild degenerative changes of the lumbar spine are noted with anterolisthesis of L5 on S1. IMPRESSION: Status post cholecystectomy and appendectomy. Bilateral prominent renal collecting systems without definitive obstructing stone. These changes on the right are likely related to crossing over iliac artery as the more distal ureter is within normal limits. The changes on the left may be related to a degree of UPJ obstruction. Electronically Signed   By: Inez Catalina M.D.   On: 08/20/2020 17:00    Richarda Osmond, DO 08/21/2020,  6:22 AM PGY-3, [place] [facility] Intern pager: [phone], text pages welcome

## 2020-08-21 NOTE — Progress Notes (Signed)
New Admission Note:   Arrival Method: Bed  Mental Orientation: Alert and Oriented  Telemetry: none  Assessment: Completed Skin: intact  IV: Right forearm and left ac  Pain: 4/10  Tubes: none  Safety Measures: Safety Fall Prevention Plan has been given, discussed and signed Admission: Completed [facility] Orientation: Patient has been orientated to the room, unit and staff.  Family: husband   Orders have been reviewed and implemented. Will continue to monitor the patient. Call light has been placed within reach and bed alarm has been activated.   Goku Harb RN [facility] Phone: [phone]

## 2020-08-21 NOTE — Discharge Summary (Signed)
[facility] Discharge Summary  Patient name: Leah Pruitt Medical record number: [id number] Date of birth: 1946/01/31 Age: 75 y.o. Gender: female Date of Admission: 08/20/2020  Date of Discharge: 08/22/2020 Admitting Physician: Richarda Osmond, DO  Primary Care Provider: Pcp, No Consultants: urology  Indication for Hospitalization: LLQ pain, AKI  Discharge Diagnoses/Problem List:  1.AKI with urinary tract obstruction at left UPJ-Improving 2. Acute on chronic abdominal pain-Resolved  Disposition: Stable  Discharge Condition: stable  Discharge Exam:  General: Pleasant lady sitting comfortably in bed, NAD Hardy vascular: Regular rate and rhythm, no murmurs rubs or gallops Pulmonary: Clear to auscultation bilaterally Abdomen: Soft, nontender, nondistended, no guarding, bowel sounds present Extremities: No edema appreciated, warm and dry Psych: Good mood and affect   Brief Hospital Course:  Leah Pruitt is a 75 y.o. female presented with LLQ pain x roughly 10 days. PMH is significant for chronic constipation, diverticulosis, HTN, T2DM, osteoarthritis, spinal fusion, GERD, osteoporosis.   LLQ pain-Resolved Hx significant for several possible contributions to abdominal pain including several previous abdominal surgeries, diverticulosis, chronic constipation. She has had this same problem chronically/intermittently for several years. Seen in ED 5/18 for this problem. Her vital signs were stable and initial labs unremarkable but she eloped prior to further workup. Per patient report, 5/23 PCP prescribed ciprofloxacin for presumed diverticulitis as she has had this in the past. CT abdomen negative for acute infection during this admission. Vital signs are stable with exception of mild tachycardia which appears to be chronic. On exam patient was comfortable in appearance with tenderness to deep palpation to LLQ abdomen and left flank- negative for distension,  guarding, mass, hernia. Considering pain is radiating to left flank, has microscopic hematuria and signs of possible upper urinary tract obstruction, consider a passed renal stone as contributing. Patient is post menopausal. . Home bowel regimen is milk of magnesia daily. Does not appear to be UTD on colonoscopy. In the ED, received 1L NS, 4mg  morphine, zofran. LA 2.2>2.1. Bowel regimen with Miralax and Senna followed and patient felt better with bowel movement during admission.      AKI- Improving Cr on admission 1.71 (from normal baseline ~0.8). CT abdomen was significant for bilateral prominent renal collecting systems suggestive of upper urinary tract obstruction but no stones were seen. Comparing this to previous abdominal CT 2017 which had normal appearing kidneys. Urology was consulted in ED for signs of urinary obstruction and will follow peripherally. Radiology, per ED signout, had suggested obstruction of ureter from constipation could be contributing to the changes seen on CT and will be treated as listed above. S/p 1L NS boluses in ED. Post void residual 134cc. Negative for CVA or suprapubic tenderness on exam. Without active signs of infection and ciprofloxacin having a possible contribution to AKI, will discontinue at this time. Received 125cc/hr LR and Scr ~1.61 on discharge.    All chronic conditions stable during admission.   Issues for Follow Up:  1. F/u urology outpatient 2. BMP in 1 week 3. Discontinued losartan due to AKI, please review on next PCP visit 4. Discontinued ciprofloxacin, possible recent for ongoing AKI and also not a recommended treatment for acute diverticulosis.  Please do not continue it at home and continue with bowel regimen to help constipation.  Significant Procedures: None  Significant Labs and Imaging:  Recent Labs  Lab 08/20/20 1151 08/21/20 0500 08/22/20 0731  WBC 6.7 4.6 5.3  HGB 11.6* 10.9* 10.8*  HCT 36.2 33.7* 33.1*  PLT 243 214  204    Recent Labs  Lab 08/20/20 1151 08/21/20 0500 08/22/20 0731  NA 137 136 141  K 4.1 4.0 4.3  CL 102 104 108  CO2 27 26 26   GLUCOSE 81 125* 115*  BUN 19 20 14   CREATININE 1.71* 1.79* 1.61*  CALCIUM 10.2 9.3 9.3  ALKPHOS 208*  --   --   AST 19  --   --   ALT 15  --   --   ALBUMIN 4.2  --   --      Results/Tests Pending at Time of Discharge:  CT ABDOMEN PELVIS WO CONTRAST  Result Date: 08/20/2020 CLINICAL DATA:  Left lower quadrant pain, history of diverticulitis. EXAM: CT ABDOMEN AND PELVIS WITHOUT CONTRAST TECHNIQUE: Multidetector CT imaging of the abdomen and pelvis was performed following the standard protocol without IV contrast. COMPARISON:  06/28/2019 FINDINGS: Lower chest: No acute abnormality. Hepatobiliary: No focal liver abnormality is seen. Status post cholecystectomy. No biliary dilatation. Pancreas: Unremarkable. No pancreatic ductal dilatation or surrounding inflammatory changes. Spleen: Normal in size without focal abnormality. Adrenals/Urinary Tract: Adrenal glands are within normal limits. Kidneys are well visualized bilaterally. Punctate nonobstructing right renal stone is noted. Mildly prominent extrarenal pelvis is noted. Prominence of the right ureter is seen to the level of the iliac artery without definitive obstructive change or calculus. This may be related to some extrinsic compression by the iliac artery. Prominent extrarenal pelvis and hydronephrosis is noted on the left. This is new from the prior exam although no ureteral obstructive changes are seen. Bladder is well distended. Stomach/Bowel: The appendix is not well visualized consistent with a prior surgical history. No obstructive or inflammatory changes of the large or small bowel are seen. Mild diverticular changes noted. Stomach is within normal limits. Vascular/Lymphatic: Aortic atherosclerosis. No enlarged abdominal or pelvic lymph nodes. Reproductive: Uterus and bilateral adnexa are unremarkable. Other:  No abdominal wall hernia or abnormality. No abdominopelvic ascites. Musculoskeletal: Mild degenerative changes of the lumbar spine are noted with anterolisthesis of L5 on S1. IMPRESSION: Status post cholecystectomy and appendectomy. Bilateral prominent renal collecting systems without definitive obstructing stone. These changes on the right are likely related to crossing over iliac artery as the more distal ureter is within normal limits. The changes on the left may be related to a degree of UPJ obstruction. Electronically Signed   By: Inez Catalina M.D.   On: 08/20/2020 17:00    Discharge Medications:  Allergies as of 08/22/2020      Reactions   Naratriptan Anaphylaxis   Rash, difficult time breathing and swallowing   Penicillins Other (See Comments)   Numbness of mouth and dry mouth   Iodine Swelling   Ciprofloxacin    Other reaction(s): Other (See Comments) Tendon pain   Penicillin G Rash      Medication List    STOP taking these medications   ciprofloxacin 500 MG tablet Commonly known as: CIPRO   diclofenac 25 MG EC tablet Commonly known as: VOLTAREN   gabapentin 300 MG capsule Commonly known as: Neurontin   HYDROcodone-acetaminophen 5-325 MG tablet Commonly known as: NORCO/VICODIN   metroNIDAZOLE 500 MG tablet Commonly known as: FLAGYL   olmesartan 20 MG tablet Commonly known as: BENICAR   oxyCODONE 5 MG immediate release tablet Commonly known as: Oxy IR/ROXICODONE   oxyCODONE-acetaminophen 10-325 MG tablet Commonly known as: PERCOCET   tiZANidine 4 MG tablet Commonly known as: ZANAFLEX     TAKE these medications   ACIPHEX PO Take 20 mg  by mouth in the morning and at bedtime.   Calcium Carbonate-Vitamin D 600-200 MG-UNIT Tabs Take 1 tablet by mouth 2 (two) times daily with a meal.   Carafate 1 g tablet Generic drug: sucralfate Take 1 g by mouth 2 (two) times daily.   Cholecalciferol 25 MCG (1000 UT) tablet Take 2,000 Units by mouth daily.   diclofenac  sodium 1 % Gel Commonly known as: Voltaren Apply 2-4 g topically 4 (four) times daily.   diltiazem 120 MG 24 hr capsule Commonly known as: TIAZAC Do not take till you talk with your Medical doctor about her blood pressure What changed:   how much to take  how to take this  when to take this   furosemide 20 MG tablet Commonly known as: LASIX Take 20 mg by mouth daily as needed for fluid.   Glumetza 500 MG (MOD) 24 hr tablet Generic drug: metFORMIN Take 500 mg by mouth 3 (three) times daily.   magnesium hydroxide 800 MG/5ML suspension Commonly known as: MILK OF MAGNESIA Take 30 mLs by mouth daily.   meclizine 25 MG tablet Commonly known as: ANTIVERT Take 25 mg by mouth daily as needed for dizziness.   nitroGLYCERIN 0.4 MG SL tablet Commonly known as: NITROSTAT Place 0.4 mg under the tongue every 5 (five) minutes as needed for chest pain.   polyethylene glycol 17 g packet Commonly known as: MiraLax Take 17 g by mouth 2 (two) times daily as needed.   promethazine 25 MG tablet Commonly known as: PHENERGAN Take 25 mg by mouth every 8 (eight) hours as needed for nausea or vomiting.   senna 8.6 MG Tabs tablet Commonly known as: SENOKOT Take 1 tablet (8.6 mg total) by mouth 2 (two) times daily as needed for mild constipation.   traMADol 50 MG tablet Commonly known as: ULTRAM Take 1-2 tablets (50-100 mg total) by mouth 3 (three) times daily as needed. What changed: reasons to take this       Discharge Instructions: Please refer to Patient Instructions section of EMR for full details.  Patient was counseled important signs and symptoms that should prompt return to medical care, changes in medications, dietary instructions, activity restrictions, and follow up appointments.   Follow-Up Appointments:  Follow-up Information    Street, [name], MD. Schedule an appointment as soon as possible for a visit in 1 week(s).   Specialty: Family Medicine Contact  information: [place] [address] [phone]               Honor Junes, MD 08/22/2020, 11:34 AM PGY-1, [place]

## 2020-08-21 NOTE — Evaluation (Signed)
Physical Therapy Evaluation Patient Details Name: Leah Pruitt MRN: [id number] DOB: 16-Sep-1945 Today's Date: 08/21/2020   History of Present Illness  The pt is a 75 yo female presenting 5/27 with c/o LLQ abdominal pain and vomiting. Upon workup, pt found to have AKI, PMH includes: appendectomy, cholecystectomy, chronic constipation, DM II, GERD, HTN, and migraines.    Clinical Impression  Pt in bed upon arrival of PT, agreeable to evaluation at this time. Prior to admission the pt was completely independent with all mobility and ADLs without need for AD. Living with spouse in home with 7 steps to enter, enjoys gardening and working in the yard. The pt now presents with minor limitations in activity tolerance due to above dx and resulting pain, but is safe to return home with family support when medially cleared. The pt is eager to return home and reports she is at her baseline level of function at this time. The pt was able to complete all sit-stand transfers without assist or instability, hallway ambulation without AD or LOB, and completed navigation of stairs with use of single rail and good stability. Neither the pt nor her spouse have concerns or and further questions. Pt and her spouse educated on fall reduction strategies with return home, and progressive return to mobility. Both expressed understanding. No further acute PT needs identified at this time, thank you for the consult. Please feel free to re-consult if change in status.     Follow Up Recommendations No PT follow up;Supervision for mobility/OOB    Equipment Recommendations  None recommended by PT    Recommendations for Other Services       Precautions / Restrictions Precautions Precautions: None Restrictions Weight Bearing Restrictions: No      Mobility  Bed Mobility Overal bed mobility: Independent                  Transfers Overall transfer level: Independent Equipment used: None              General transfer comment: sit-stand without assist or evidence of instability. no need for UE support. pt able to complete from various surfaces in room  Ambulation/Gait Ambulation/Gait assistance: Min guard Gait Distance (Feet): 100 Feet Assistive device: None Gait Pattern/deviations: Step-through pattern;Decreased stride length Gait velocity: 0.6 m/s Gait velocity interpretation: 1.31 - 2.62 ft/sec, indicative of limited community ambulator General Gait Details: slowed gait but stable. no UE support needed or overt LOB with any changes in speed or direction.  Stairs Stairs: Yes Stairs assistance: Min guard Stair Management: One rail Right;Alternating pattern;Forwards Number of Stairs: 3 General stair comments: 3 steps with use of single rail, no instability with single UE support      Balance Overall balance assessment: Mild deficits observed, not formally tested                                           Pertinent Vitals/Pain Pain Assessment: Faces Faces Pain Scale: Hurts a little bit Pain Location: abdomen, headache Pain Descriptors / Indicators: Discomfort;Headache Pain Intervention(s): Limited activity within patient's tolerance;Monitored during session;Repositioned    Home Living Family/patient expects to be discharged to:: Private residence Living Arrangements: Spouse/significant other Available Help at Discharge: Family;Available 24 hours/day Type of Home: House Home Access: Stairs to enter Entrance Stairs-Rails: Psychiatric nurse of Steps: 7-10 Home Layout: One level Home Equipment: Shower seat;Grab bars - tub/shower  Prior Function Level of Independence: Independent         Comments: independent without need for AD, enjoys gardening and working in the yard     [employer]        Extremity/Trunk Assessment   Upper Extremity Assessment Upper Extremity Assessment: Overall WFL for tasks assessed    Lower  Extremity Assessment Lower Extremity Assessment: Overall WFL for tasks assessed    Cervical / Trunk Assessment Cervical / Trunk Assessment: Normal  Communication   Communication: No difficulties;Prefers language other than English (pt and spouse declined use of interpreter)  Cognition Arousal/Alertness: Awake/alert Behavior During Therapy: WFL for tasks assessed/performed Overall Cognitive Status: Within Functional Limits for tasks assessed                                        General Comments General comments (skin integrity, edema, etc.): VSS on RA, no evidence of instability. pt stating she is at baseline, eager to return home        Assessment/Plan    PT Assessment Patent does not need any further PT services         PT Goals (Current goals can be found in the Care Plan section)  Acute Rehab PT Goals Patient Stated Goal: return home asap PT Goal Formulation: With patient/family Time For Goal Achievement: 08/28/20 Potential to Achieve Goals: Good     AM-PAC PT "6 Clicks" Mobility  Outcome Measure Help needed turning from your back to your side while in a flat bed without using bedrails?: None Help needed moving from lying on your back to sitting on the side of a flat bed without using bedrails?: None Help needed moving to and from a bed to a chair (including a wheelchair)?: None Help needed standing up from a chair using your arms (e.g., wheelchair or bedside chair)?: None Help needed to walk in hospital room?: A Little Help needed climbing 3-5 steps with a railing? : A Little 6 Click Score: 22    End of Session Equipment Utilized During Treatment: Gait belt Activity Tolerance: Patient tolerated treatment well Patient left: in chair;with call bell/phone within reach;with family/visitor present Nurse Communication: Mobility status PT Visit Diagnosis: Pain Pain - part of body:  (abdomen)    Time: 6812-7517 PT Time Calculation (min) (ACUTE  ONLY): 17 min   Charges:   PT Evaluation $PT Eval Low Complexity: 1 Low          Karma Ganja, PT, DPT   Acute Rehabilitation Department Pager #: [phone]  Otho Bellows 08/21/2020, 2:48 PM

## 2020-08-21 NOTE — Plan of Care (Signed)
  Problem: Health Behavior/Discharge Planning: Goal: Ability to manage health-related needs will improve Outcome: Progressing   

## 2020-08-21 NOTE — ED Notes (Signed)
Ambulated to bathroom, states she had good results with having a bowel movement.

## 2020-08-21 NOTE — ED Notes (Signed)
This NT attempted to draw blood. Pts husband stated that if she had to be stuck with a needle then he did not want the blood drawn. RN made aware.

## 2020-08-22 DIAGNOSIS — R1032 Left lower quadrant pain: Secondary | ICD-10-CM

## 2020-08-22 DIAGNOSIS — Q6211 Congenital occlusion of ureteropelvic junction: Secondary | ICD-10-CM

## 2020-08-22 DIAGNOSIS — K5900 Constipation, unspecified: Secondary | ICD-10-CM | POA: Diagnosis not present

## 2020-08-22 DIAGNOSIS — N179 Acute kidney failure, unspecified: Secondary | ICD-10-CM | POA: Diagnosis not present

## 2020-08-22 LAB — CBC
HCT: 33.1 % — ABNORMAL LOW (ref 36.0–46.0)
Hemoglobin: 10.8 g/dL — ABNORMAL LOW (ref 12.0–15.0)
MCH: 28.6 pg (ref 26.0–34.0)
MCHC: 32.6 g/dL (ref 30.0–36.0)
MCV: 87.6 fL (ref 80.0–100.0)
Platelets: 204 10*3/uL (ref 150–400)
RBC: 3.78 MIL/uL — ABNORMAL LOW (ref 3.87–5.11)
RDW: 14.2 % (ref 11.5–15.5)
WBC: 5.3 10*3/uL (ref 4.0–10.5)
nRBC: 0 % (ref 0.0–0.2)

## 2020-08-22 LAB — FERRITIN: Ferritin: 34 ng/mL (ref 11–307)

## 2020-08-22 LAB — BASIC METABOLIC PANEL
Anion gap: 7 (ref 5–15)
BUN: 14 mg/dL (ref 8–23)
CO2: 26 mmol/L (ref 22–32)
Calcium: 9.3 mg/dL (ref 8.9–10.3)
Chloride: 108 mmol/L (ref 98–111)
Creatinine, Ser: 1.61 mg/dL — ABNORMAL HIGH (ref 0.44–1.00)
GFR, Estimated: 34 mL/min — ABNORMAL LOW (ref >60)
Glucose, Bld: 115 mg/dL — ABNORMAL HIGH (ref 70–99)
Potassium: 4.3 mmol/L (ref 3.5–5.1)
Sodium: 141 mmol/L (ref 135–145)

## 2020-08-22 LAB — GLUCOSE, CAPILLARY: Glucose-Capillary: 143 mg/dL — ABNORMAL HIGH (ref 70–99)

## 2020-08-22 MED ORDER — SENNA 8.6 MG PO TABS: 1.0000 | ORAL_TABLET | Freq: Two times a day (BID) | ORAL | 0 refills | Status: DC | PRN

## 2020-08-22 MED ORDER — POLYETHYLENE GLYCOL 3350 17 G PO PACK: 17.0000 g | PACK | Freq: Two times a day (BID) | ORAL | 0 refills | Status: DC | PRN

## 2020-08-22 NOTE — Hospital Course (Addendum)
Leah Pruitt is a 75 y.o. female presented with LLQ pain x roughly 10 days. PMH is significant for chronic constipation, diverticulosis, HTN, T2DM, osteoarthritis, spinal fusion, GERD, osteoporosis.   LLQ pain-Resolved Hx significant for several possible contributions to abdominal pain including several previous abdominal surgeries, diverticulosis, chronic constipation. She has had this same problem chronically/intermittently for several years. Seen in ED 5/18 for this problem. Her vital signs were stable and initial labs unremarkable but she eloped prior to further workup. Per patient report, 5/23 PCP prescribed ciprofloxacin for presumed diverticulitis as she has had this in the past. CT abdomen negative for acute infection during this admission. Vital signs are stable with exception of mild tachycardia which appears to be chronic. On exam patient was comfortable in appearance with tenderness to deep palpation to LLQ abdomen and left flank- negative for distension, guarding, mass, hernia. Considering pain is radiating to left flank, has microscopic hematuria and signs of possible upper urinary tract obstruction, consider a passed renal stone as contributing. Patient is post menopausal. . Home bowel regimen is milk of magnesia daily. Does not appear to be UTD on colonoscopy. In the ED, received 1L NS, 4mg  morphine, zofran. LA 2.2>2.1. Bowel regimen with Miralax and Senna followed and patient felt better with bowel movement during admission.      AKI- Improving Cr on admission 1.71 (from normal baseline ~0.8). CT abdomen was significant for bilateral prominent renal collecting systems suggestive of upper urinary tract obstruction but no stones were seen. Comparing this to previous abdominal CT 2017 which had normal appearing kidneys. Urology was consulted in ED for signs of urinary obstruction and will follow peripherally. Radiology, per ED signout, had suggested obstruction of ureter from constipation  could be contributing to the changes seen on CT and will be treated as listed above. S/p 1L NS boluses in ED. Post void residual 134cc. Negative for CVA or suprapubic tenderness on exam. Without active signs of infection and ciprofloxacin having a possible contribution to AKI, will discontinue at this time. Received 125cc/hr LR and Scr ~1.61 on discharge.    All chronic conditions stable during admission.

## 2020-08-22 NOTE — Progress Notes (Signed)
DISCHARGE NOTE HOME Leah Pruitt to be discharged Home per MD order. Discussed prescriptions and follow up appointments with the patient. Prescriptions given to patient; medication list explained in detail. Patient verbalized understanding.  Skin clean, dry and intact without evidence of skin break down, no evidence of skin tears noted. IV catheter discontinued intact. Site without signs and symptoms of complications. Dressing and pressure applied. Pt denies pain at the site currently. No complaints noted.  Patient free of lines, drains, and wounds.   An After Visit Summary (AVS) was printed and given to the patient. Patient escorted via wheelchair, and discharged home via private auto.  Arlyss [name], RN

## 2020-08-22 NOTE — Discharge Instructions (Signed)
Dear Ms. Lux,  You presented with belly pain which is better now.   Please see below:  -- Discontinue taking Losartan at home -- Please see your primary care provider within 1 week and get your BMP checked for your kidney functions. -- As per recommendations, for diverticulosis please do not take antibiotic-like Cephalosporin as it is not a required treatment and bowel regimen to help with bowel movement should help. Antibiotic might be a reason for your kidney strain.   It was pleasure taking care of you! Dr. Demaris Callander

## 2020-08-24 LAB — HEMOGLOBIN A1C
Hgb A1c MFr Bld: 5.8 % — ABNORMAL HIGH (ref 4.8–5.6)
Mean Plasma Glucose: 120 mg/dL

## 2020-08-31 ENCOUNTER — Other Ambulatory Visit: Payer: Self-pay | Admitting: Family Medicine

## 2020-08-31 DIAGNOSIS — M810 Age-related osteoporosis without current pathological fracture: Secondary | ICD-10-CM

## 2020-08-31 DIAGNOSIS — M546 Pain in thoracic spine: Secondary | ICD-10-CM

## 2020-08-31 DIAGNOSIS — M549 Dorsalgia, unspecified: Secondary | ICD-10-CM

## 2020-09-02 DIAGNOSIS — K6389 Other specified diseases of intestine: Secondary | ICD-10-CM | POA: Diagnosis not present

## 2020-09-02 DIAGNOSIS — K5904 Chronic idiopathic constipation: Secondary | ICD-10-CM | POA: Diagnosis not present

## 2020-09-02 DIAGNOSIS — R338 Other retention of urine: Secondary | ICD-10-CM | POA: Diagnosis not present

## 2020-09-02 DIAGNOSIS — N170 Acute kidney failure with tubular necrosis: Secondary | ICD-10-CM | POA: Diagnosis not present

## 2020-09-02 DIAGNOSIS — T40601A Poisoning by unspecified narcotics, accidental (unintentional), initial encounter: Secondary | ICD-10-CM | POA: Diagnosis not present

## 2020-09-02 DIAGNOSIS — E611 Iron deficiency: Secondary | ICD-10-CM | POA: Diagnosis not present

## 2020-09-07 ENCOUNTER — Other Ambulatory Visit: Payer: Self-pay

## 2020-09-07 ENCOUNTER — Ambulatory Visit
Admission: RE | Admit: 2020-09-07 | Discharge: 2020-09-07 | Disposition: A | Discharge status: Home / Self Care | Payer: Medicare Other | Source: Ambulatory Visit | Attending: Family Medicine | Admitting: Family Medicine

## 2020-09-07 DIAGNOSIS — M546 Pain in thoracic spine: Secondary | ICD-10-CM

## 2020-09-07 DIAGNOSIS — M5114 Intervertebral disc disorders with radiculopathy, thoracic region: Secondary | ICD-10-CM | POA: Diagnosis not present

## 2020-09-07 DIAGNOSIS — M549 Dorsalgia, unspecified: Secondary | ICD-10-CM

## 2020-09-07 DIAGNOSIS — M810 Age-related osteoporosis without current pathological fracture: Secondary | ICD-10-CM

## 2020-09-07 DIAGNOSIS — M5116 Intervertebral disc disorders with radiculopathy, lumbar region: Secondary | ICD-10-CM | POA: Diagnosis not present

## 2020-09-07 DIAGNOSIS — M48061 Spinal stenosis, lumbar region without neurogenic claudication: Secondary | ICD-10-CM | POA: Diagnosis not present

## 2020-09-07 IMAGING — MR MR LUMBAR SPINE W/O CM
4 of 5 series · 27 of 48 positions shown · non-contrast
Comparison: Lumbar MRI [DATE]

CLINICAL DATA: Back pain radiating into the left arm, body, and leg
for years.

EXAM:
MRI THORACIC AND LUMBAR SPINE WITHOUT CONTRAST
TECHNIQUE: Multiplanar and multiecho pulse sequences of the thoracic and lumbar
spine were obtained without intravenous contrast.

[Series 3: T2 · sagittal · 4.0mm · 1.09mm/px · 6 of 15 slices shown (1 of 2)]
[im 1/15]
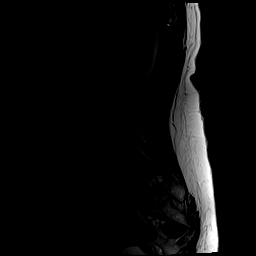
[im 3/15]
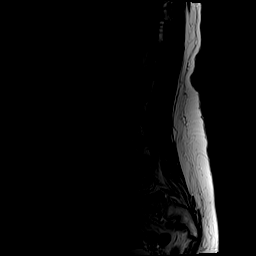
[im 6/15]
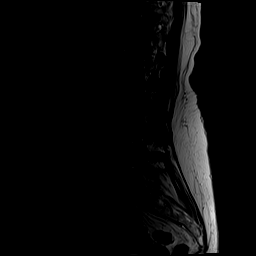
[im 9/15]
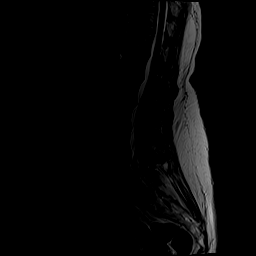
[im 12/15]
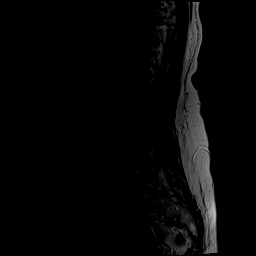
[im 15/15]
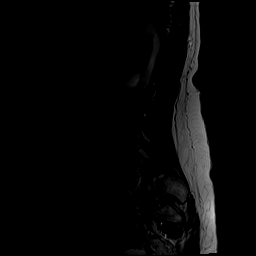

[Series 5: T1 · sagittal · 4.0mm · 1.09mm/px · 6 of 15 slices shown (1 of 2)]
[im 1/15]
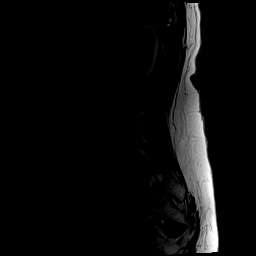
[im 3/15]
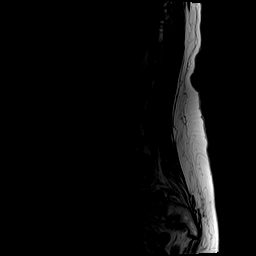
[im 6/15]
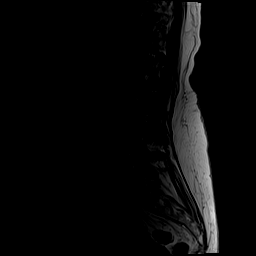
[im 9/15]
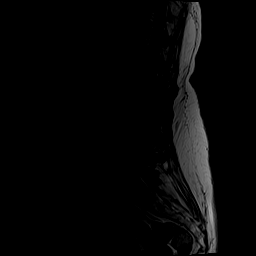
[im 12/15]
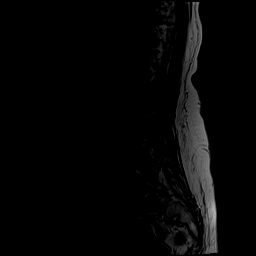
[im 15/15]
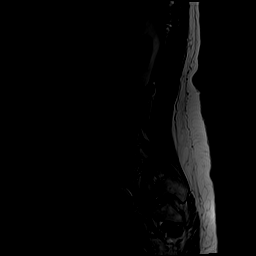

[Series 7: T2 · axial · 4.0mm · 0.39mm/px · z∈[-404,-193]mm · 9 of 35 slices shown (2 of 2)]
[im 1/35]
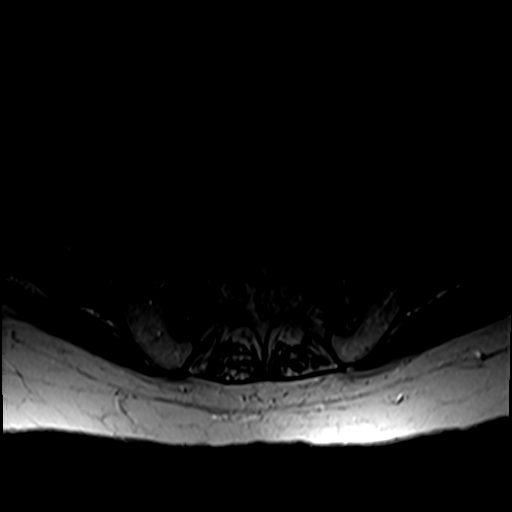
[im 5/35]
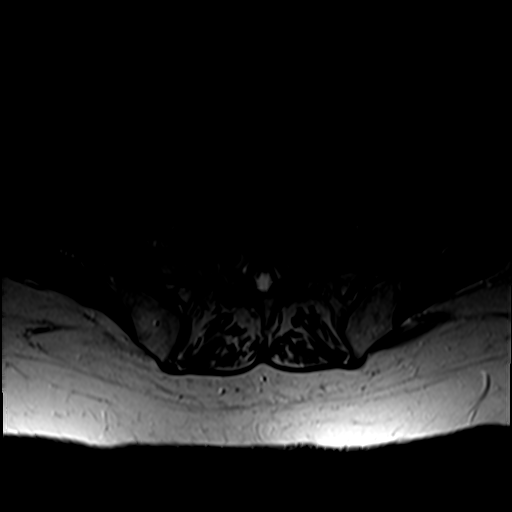
[im 10/35]
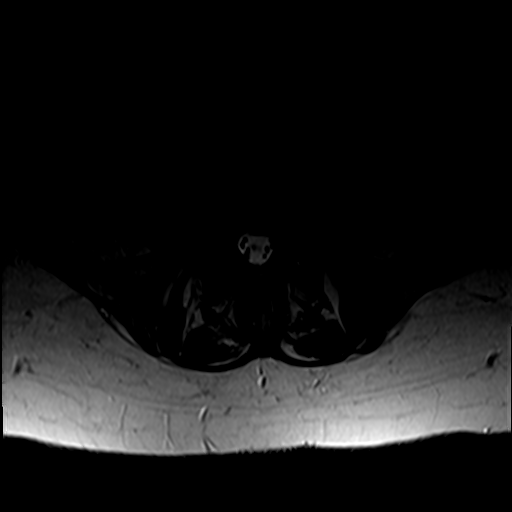
[im 15/35]
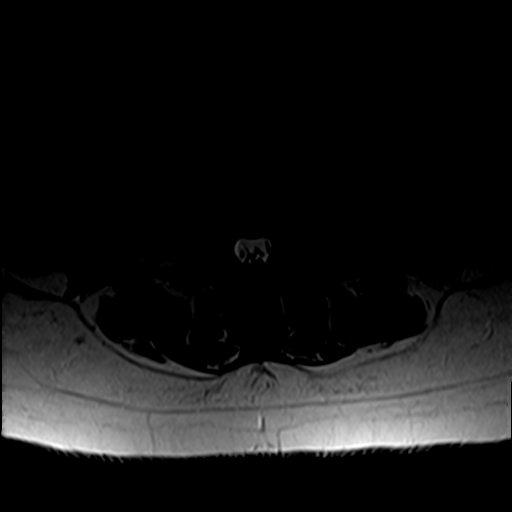
[im 18/35]
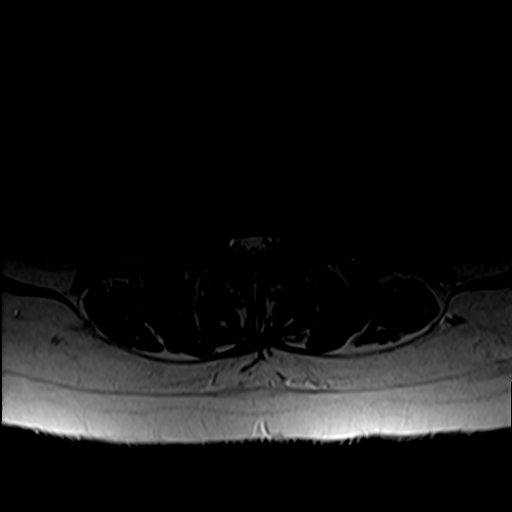
[im 20/35]
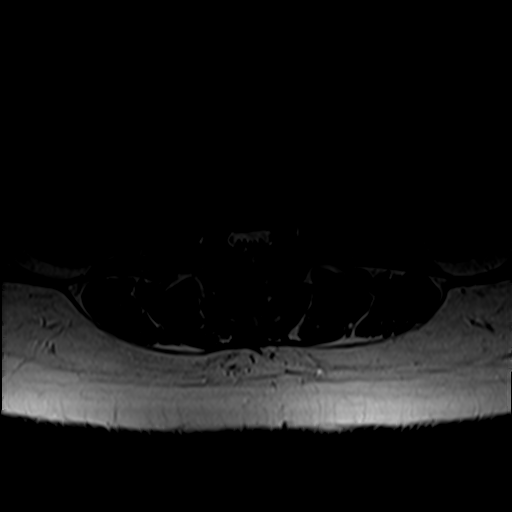
[im 25/35]
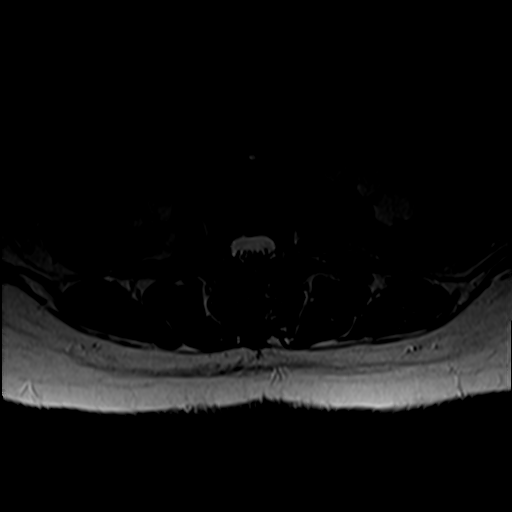
[im 30/35]
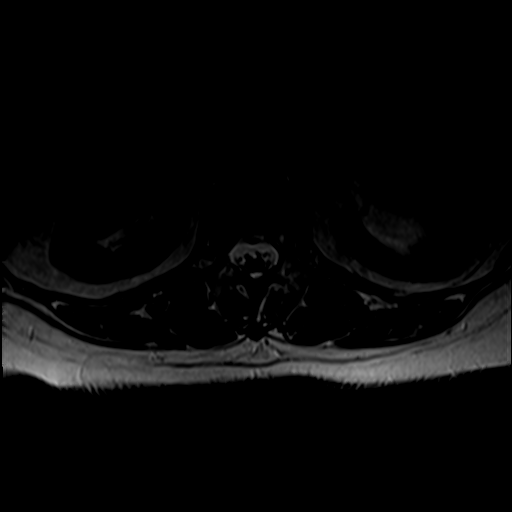
[im 35/35]
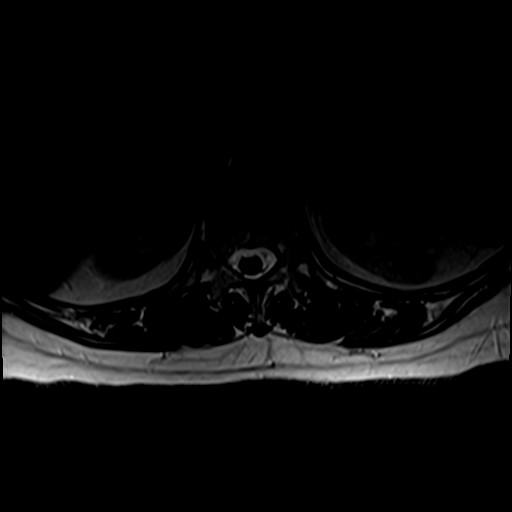

[Series 8: T1 · axial · 4.0mm · 0.39mm/px · z∈[-404,-217]mm · 6 of 35 slices shown (2 of 2)]
[im 1/35]
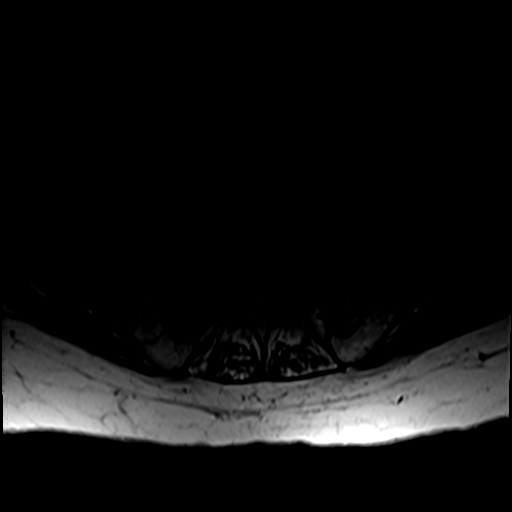
[im 5/35]
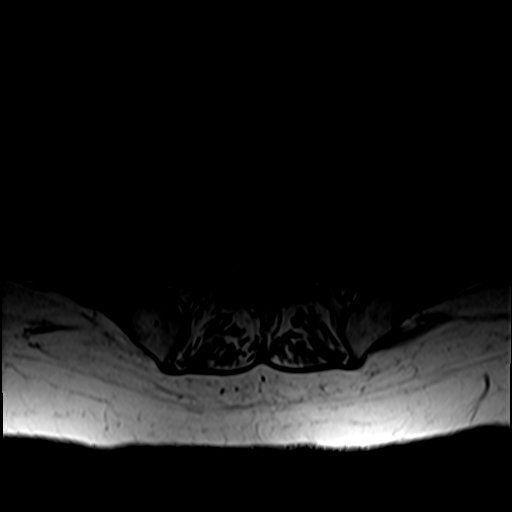
[im 10/35]
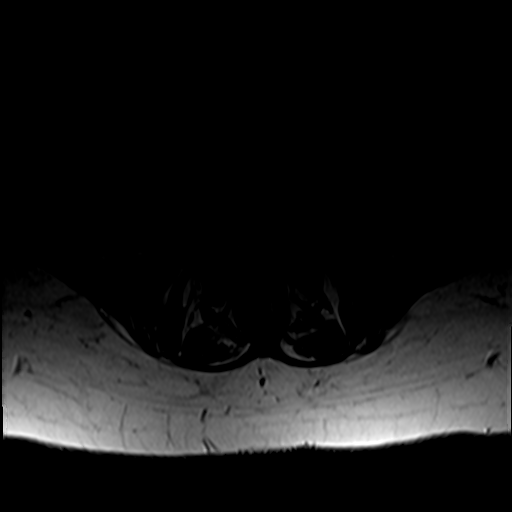
[im 15/35]
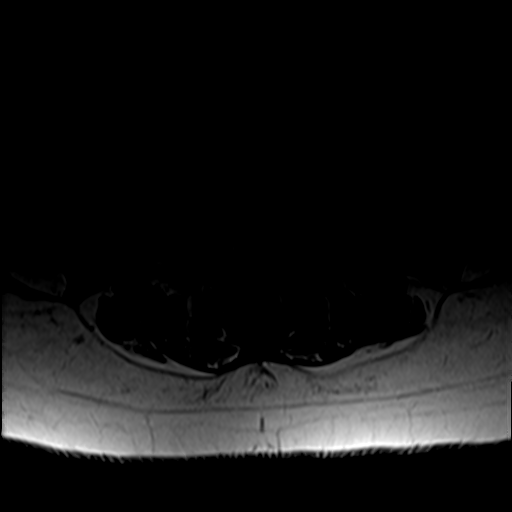
[im 18/35]
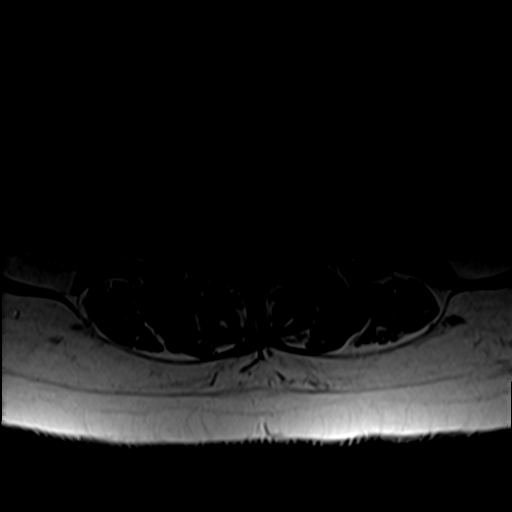
[im 30/35]
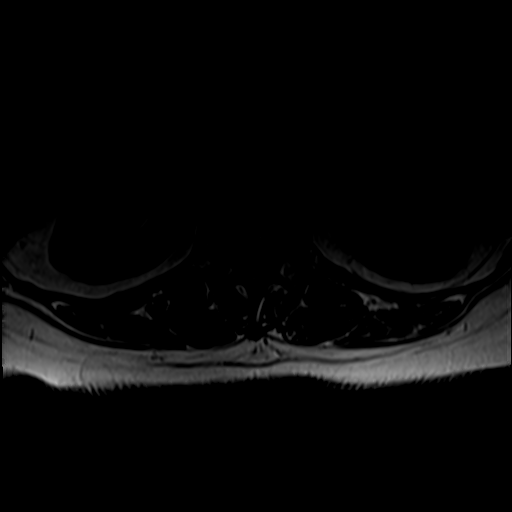

[27 of 48 positions shown; findings below may reference images not displayed]

FINDINGS: MRI THORACIC SPINE FINDINGS

Alignment:  Mild C7-T1 to T4-5 anterolisthesis.

Vertebrae: No fracture, evidence of discitis, or bone lesion.
Incomplete segmentation or bridging anterior osteophyte at T9-10 and
T10-11

Cord:  Normal signal and morphology.

Paraspinal and other soft tissues: Left hydronephrosis recently
evaluated by abdominal CT [DATE]. Hydronephrosis is new from a
[M4] lumbar MRI comparison

Disc levels:

Mild disc bulging/small protrusions posteriorly at T8-9 and below.
No neural impingement.

MRI LUMBAR SPINE FINDINGS

Segmentation:  5 lumbar type vertebrae

Alignment:  Grade 1 anterolisthesis at L5-S1, facet mediated

Vertebrae:  No fracture, evidence of discitis, or bone lesion.

Conus medullaris and cauda equina: Conus extends to the L1-2 level.
Conus and cauda equina appear normal.

Paraspinal and other soft tissues: Left hydronephrosis with abrupt
transition at the UPJ, as above.

Disc levels:

L1-L2: Mild disc narrowing and bulging

L2-L3: Disc narrowing and bulging. Mild triangular narrowing of the
thecal sac

L3-L4: Mild disc narrowing and bulging

L4-L5: Mild disc narrowing and bulging. Mild facet spurring

L5-S1:Facet osteoarthritis with spurring and anterolisthesis. The
disc is circumferentially bulging and there is bilateral
subarticular recess narrowing that could affect the S1 nerve roots.
The foramina are patent
IMPRESSION: MR THORACIC SPINE IMPRESSION

No specific cause for symptoms. No impingement, inflammation, or
cord signal abnormality.

MR LUMBAR SPINE IMPRESSION

1. Generalized disc bulging and lower lumbar facet osteoarthritis
with L5-S1 anterolisthesis. There is bilateral subarticular recess
stenosis and potential impingement at L5-S1.
2. Left hydronephrosis with abrupt transition at the UPJ, recently
evaluated by abdominal CT. Hydronephrosis has been acquired since

## 2020-09-07 IMAGING — MR MR THORACIC SPINE W/O CM
4 of 6 series · 17 of 48 positions shown · non-contrast
Comparison: Lumbar MRI [DATE]

CLINICAL DATA: Back pain radiating into the left arm, body, and leg
for years.

EXAM:
MRI THORACIC AND LUMBAR SPINE WITHOUT CONTRAST
TECHNIQUE: Multiplanar and multiecho pulse sequences of the thoracic and lumbar
spine were obtained without intravenous contrast.

[Series 2: T1 · sagittal · 3.0mm · 1.06mm/px · 2 of 7 slices shown]
[im 1/7]
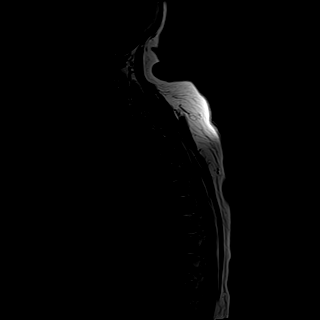
[im 7/7]
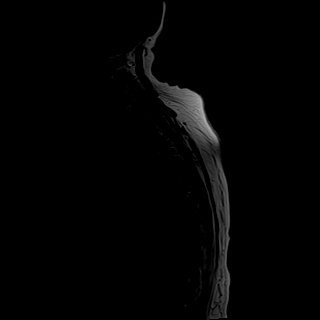

[Series 4: T2 · sagittal · 4.0mm · 0.47mm/px · 6 of 17 slices shown (1 of 3)]
[im 1/17]
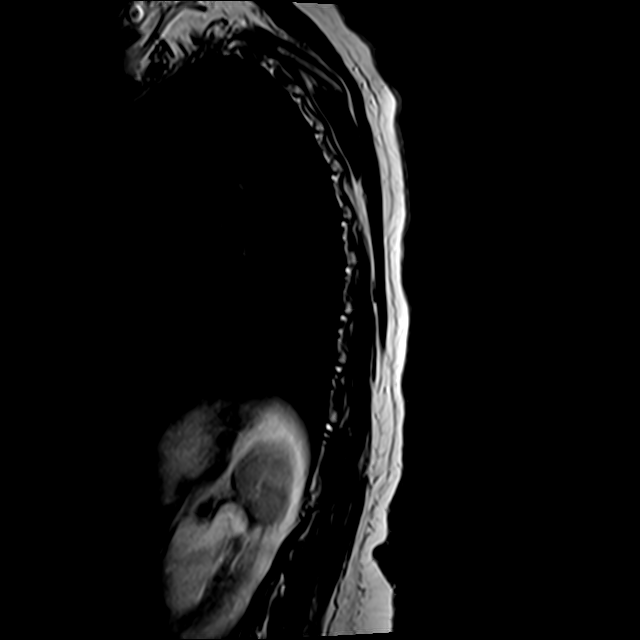
[im 4/17]
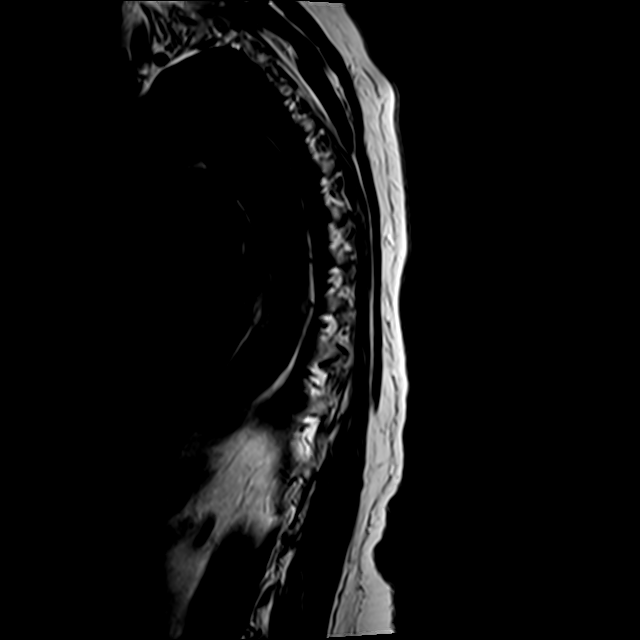
[im 7/17]
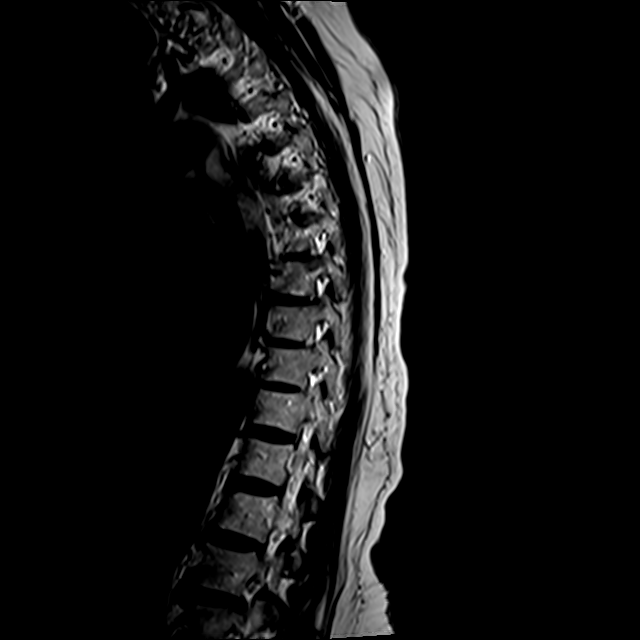
[im 10/17]
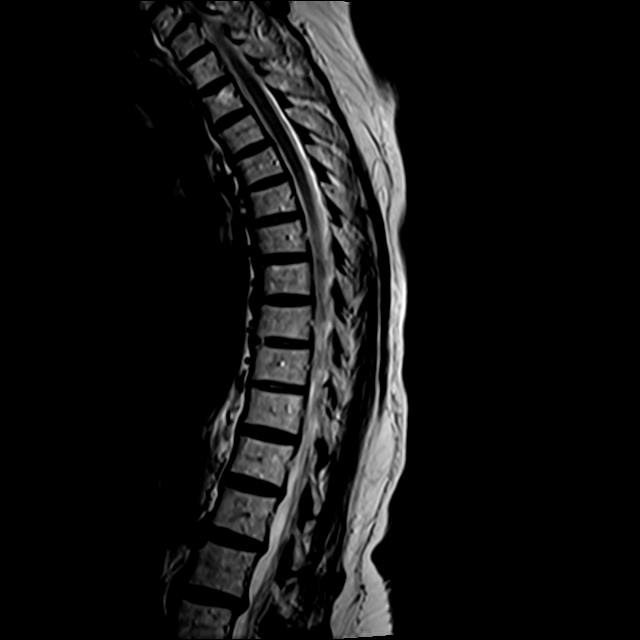
[im 13/17]
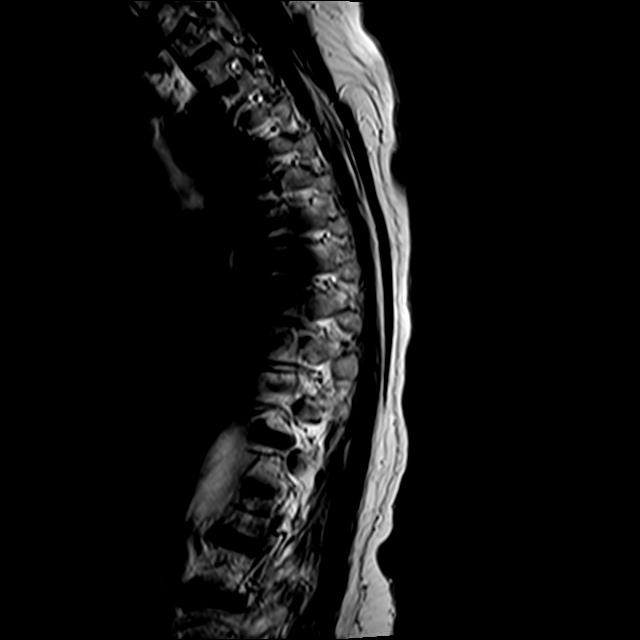
[im 17/17]
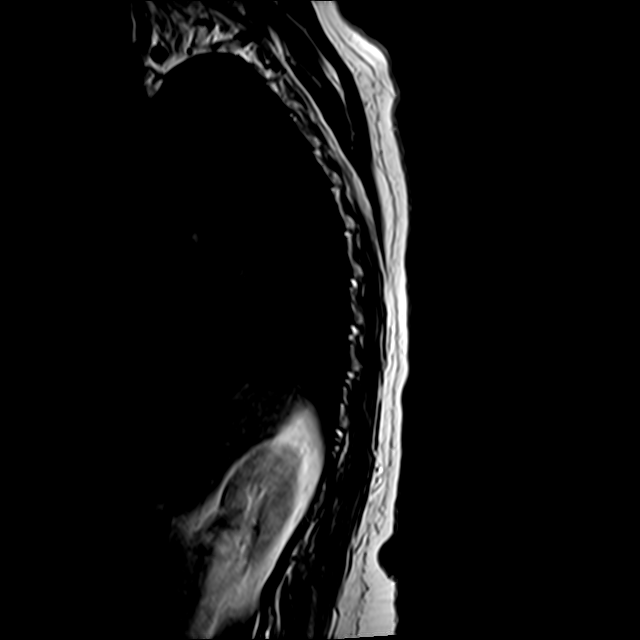

[Series 7: T2 · axial · 4.0mm · 0.39mm/px · z∈[-229,-87]mm · 6 of 39 slices shown (2 of 3)]
[im 1/39]
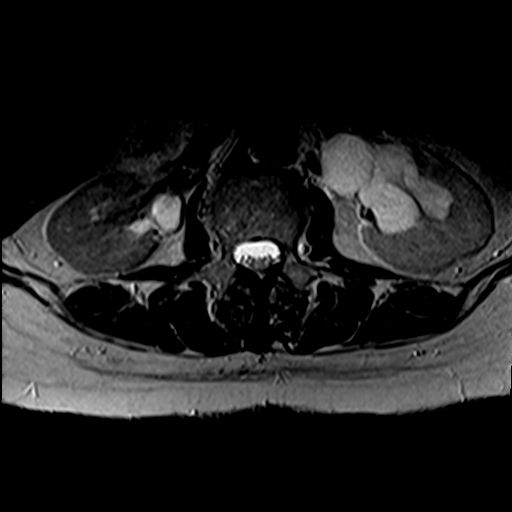
[im 6/39]
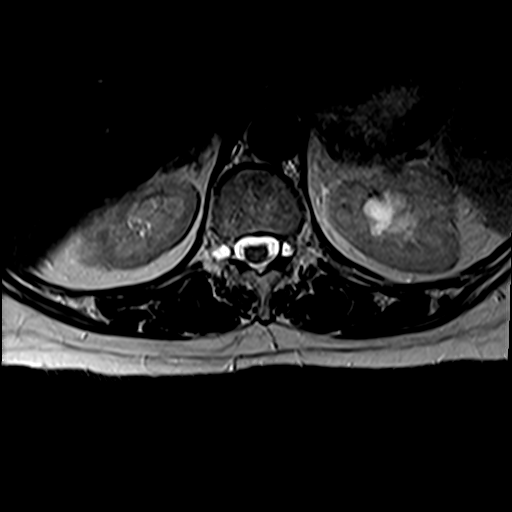
[im 12/39]
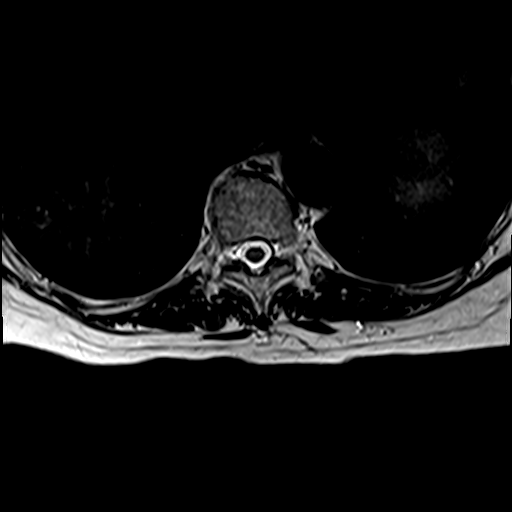
[im 18/39]
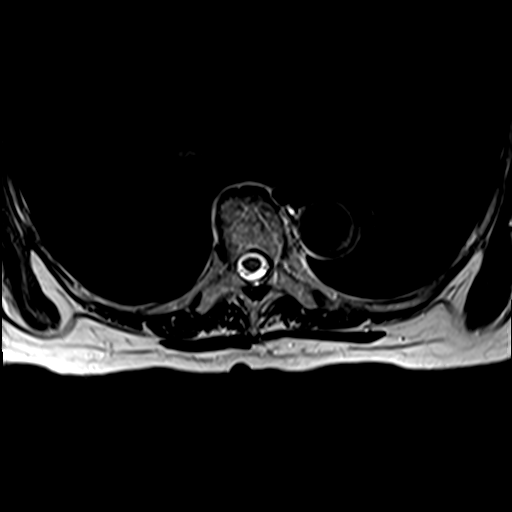
[im 21/39]
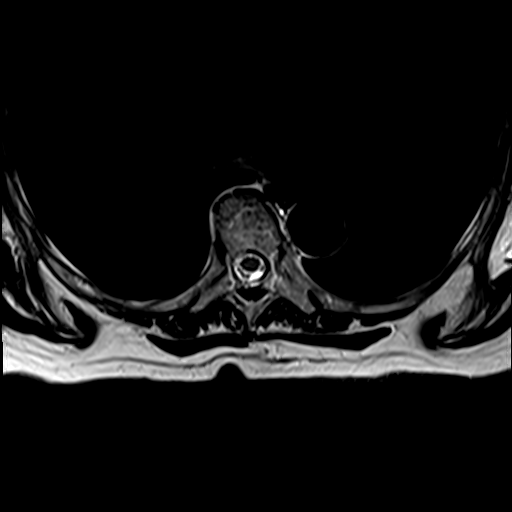
[im 33/39]
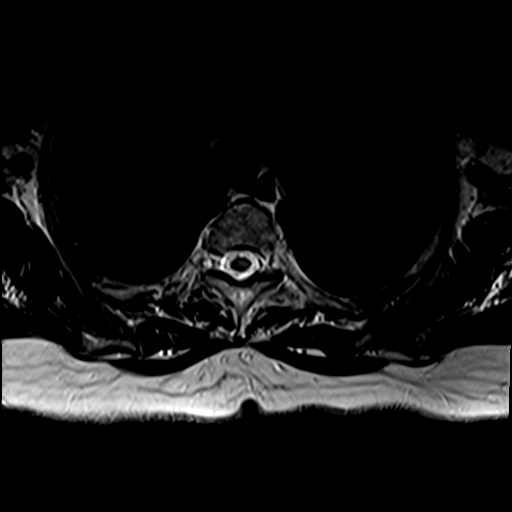

[Series 8: T2 · axial · 4.0mm · 0.39mm/px · z∈[-196,-87]mm · 3 of 39 slices shown (3 of 3)]
[im 6/39]
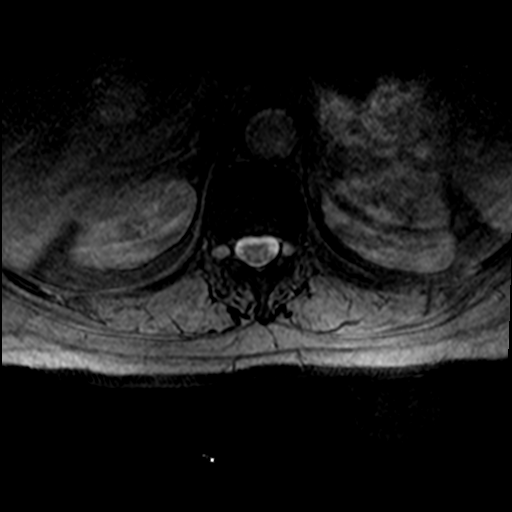
[im 21/39]
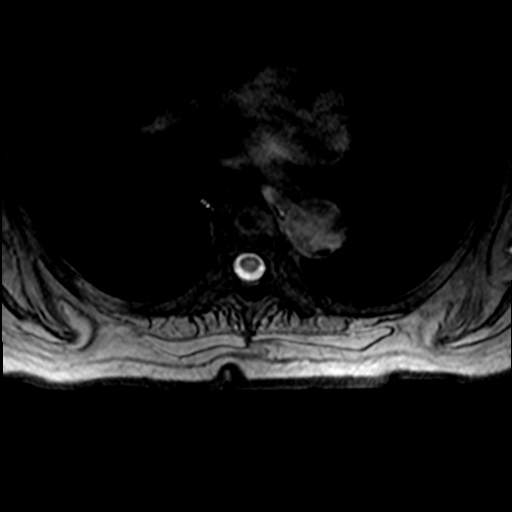
[im 33/39]
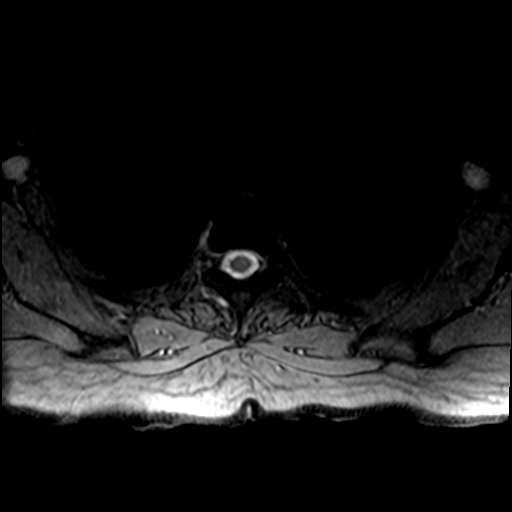

[17 of 48 positions shown; findings below may reference images not displayed]

FINDINGS: MRI THORACIC SPINE FINDINGS

Alignment:  Mild C7-T1 to T4-5 anterolisthesis.

Vertebrae: No fracture, evidence of discitis, or bone lesion.
Incomplete segmentation or bridging anterior osteophyte at T9-10 and
T10-11

Cord:  Normal signal and morphology.

Paraspinal and other soft tissues: Left hydronephrosis recently
evaluated by abdominal CT [DATE]. Hydronephrosis is new from a
[M4] lumbar MRI comparison

Disc levels:

Mild disc bulging/small protrusions posteriorly at T8-9 and below.
No neural impingement.

MRI LUMBAR SPINE FINDINGS

Segmentation:  5 lumbar type vertebrae

Alignment:  Grade 1 anterolisthesis at L5-S1, facet mediated

Vertebrae:  No fracture, evidence of discitis, or bone lesion.

Conus medullaris and cauda equina: Conus extends to the L1-2 level.
Conus and cauda equina appear normal.

Paraspinal and other soft tissues: Left hydronephrosis with abrupt
transition at the UPJ, as above.

Disc levels:

L1-L2: Mild disc narrowing and bulging

L2-L3: Disc narrowing and bulging. Mild triangular narrowing of the
thecal sac

L3-L4: Mild disc narrowing and bulging

L4-L5: Mild disc narrowing and bulging. Mild facet spurring

L5-S1:Facet osteoarthritis with spurring and anterolisthesis. The
disc is circumferentially bulging and there is bilateral
subarticular recess narrowing that could affect the S1 nerve roots.
The foramina are patent
IMPRESSION: MR THORACIC SPINE IMPRESSION

No specific cause for symptoms. No impingement, inflammation, or
cord signal abnormality.

MR LUMBAR SPINE IMPRESSION

1. Generalized disc bulging and lower lumbar facet osteoarthritis
with L5-S1 anterolisthesis. There is bilateral subarticular recess
stenosis and potential impingement at L5-S1.
2. Left hydronephrosis with abrupt transition at the UPJ, recently
evaluated by abdominal CT. Hydronephrosis has been acquired since

## 2020-09-24 DIAGNOSIS — K5909 Other constipation: Secondary | ICD-10-CM | POA: Diagnosis not present

## 2020-09-24 DIAGNOSIS — T40601A Poisoning by unspecified narcotics, accidental (unintentional), initial encounter: Secondary | ICD-10-CM | POA: Diagnosis not present

## 2020-09-24 DIAGNOSIS — N390 Urinary tract infection, site not specified: Secondary | ICD-10-CM | POA: Diagnosis not present

## 2020-09-24 DIAGNOSIS — B952 Enterococcus as the cause of diseases classified elsewhere: Secondary | ICD-10-CM | POA: Diagnosis not present

## 2020-12-01 DIAGNOSIS — N390 Urinary tract infection, site not specified: Secondary | ICD-10-CM | POA: Diagnosis not present

## 2020-12-01 DIAGNOSIS — K5909 Other constipation: Secondary | ICD-10-CM | POA: Diagnosis not present

## 2020-12-01 DIAGNOSIS — Z0000 Encounter for general adult medical examination without abnormal findings: Secondary | ICD-10-CM | POA: Diagnosis not present

## 2020-12-01 DIAGNOSIS — M542 Cervicalgia: Secondary | ICD-10-CM | POA: Diagnosis not present

## 2020-12-01 DIAGNOSIS — G44041 Chronic paroxysmal hemicrania, intractable: Secondary | ICD-10-CM | POA: Diagnosis not present

## 2020-12-06 DIAGNOSIS — Z23 Encounter for immunization: Secondary | ICD-10-CM | POA: Diagnosis not present

## 2021-01-27 ENCOUNTER — Encounter: Payer: Self-pay | Admitting: Gastroenterology

## 2021-01-27 ENCOUNTER — Ambulatory Visit: Payer: Medicare Other | Admitting: Physician Assistant

## 2021-01-27 ENCOUNTER — Encounter: Payer: Self-pay | Admitting: Physician Assistant

## 2021-02-15 ENCOUNTER — Ambulatory Visit: Payer: Medicare Other | Admitting: Physician Assistant

## 2021-02-23 ENCOUNTER — Other Ambulatory Visit (INDEPENDENT_AMBULATORY_CARE_PROVIDER_SITE_OTHER): Payer: Medicare Other

## 2021-02-23 ENCOUNTER — Encounter: Payer: Self-pay | Admitting: Gastroenterology

## 2021-02-23 ENCOUNTER — Ambulatory Visit (INDEPENDENT_AMBULATORY_CARE_PROVIDER_SITE_OTHER): Payer: Medicare Other | Admitting: Gastroenterology

## 2021-02-23 VITALS — BP 140/80 | HR 104 | Ht 61.0 in | Wt 125.1 lb

## 2021-02-23 DIAGNOSIS — K219 Gastro-esophageal reflux disease without esophagitis: Secondary | ICD-10-CM | POA: Diagnosis not present

## 2021-02-23 DIAGNOSIS — Z1211 Encounter for screening for malignant neoplasm of colon: Secondary | ICD-10-CM

## 2021-02-23 DIAGNOSIS — K5900 Constipation, unspecified: Secondary | ICD-10-CM | POA: Diagnosis not present

## 2021-02-23 DIAGNOSIS — Z1212 Encounter for screening for malignant neoplasm of rectum: Secondary | ICD-10-CM

## 2021-02-23 DIAGNOSIS — R1084 Generalized abdominal pain: Secondary | ICD-10-CM | POA: Diagnosis not present

## 2021-02-23 LAB — COMPREHENSIVE METABOLIC PANEL
ALT: 15 U/L (ref 0–35)
AST: 15 U/L (ref 0–37)
Albumin: 4.3 g/dL (ref 3.5–5.2)
Alkaline Phosphatase: 122 U/L — ABNORMAL HIGH (ref 39–117)
BUN: 19 mg/dL (ref 6–23)
CO2: 30 mEq/L (ref 19–32)
Calcium: 9.7 mg/dL (ref 8.4–10.5)
Chloride: 101 mEq/L (ref 96–112)
Creatinine, Ser: 1.19 mg/dL (ref 0.40–1.20)
GFR: 45.17 mL/min — ABNORMAL LOW (ref >60.00)
Glucose, Bld: 90 mg/dL (ref 70–99)
Potassium: 4 mEq/L (ref 3.5–5.1)
Sodium: 136 mEq/L (ref 135–145)
Total Bilirubin: 0.3 mg/dL (ref 0.2–1.2)
Total Protein: 7 g/dL (ref 6.0–8.3)

## 2021-02-23 LAB — C-REACTIVE PROTEIN: CRP: <1 mg/dL (ref 0.5–20.0)

## 2021-02-23 LAB — CBC WITH DIFFERENTIAL/PLATELET
Basophils Absolute: 0 10*3/uL (ref 0.0–0.1)
Basophils Relative: 0.9 % (ref 0.0–3.0)
Eosinophils Absolute: 0.1 10*3/uL (ref 0.0–0.7)
Eosinophils Relative: 1.2 % (ref 0.0–5.0)
HCT: 34.6 % — ABNORMAL LOW (ref 36.0–46.0)
Hemoglobin: 11.3 g/dL — ABNORMAL LOW (ref 12.0–15.0)
Lymphocytes Relative: 16.3 % (ref 12.0–46.0)
Lymphs Abs: 0.8 10*3/uL (ref 0.7–4.0)
MCHC: 32.5 g/dL (ref 30.0–36.0)
MCV: 89 fl (ref 78.0–100.0)
Monocytes Absolute: 0.4 10*3/uL (ref 0.1–1.0)
Monocytes Relative: 7.5 % (ref 3.0–12.0)
Neutro Abs: 3.8 10*3/uL (ref 1.4–7.7)
Neutrophils Relative %: 74.1 % (ref 43.0–77.0)
Platelets: 215 10*3/uL (ref 150.0–400.0)
RBC: 3.89 Mil/uL (ref 3.87–5.11)
RDW: 13.8 % (ref 11.5–15.5)
WBC: 5.1 10*3/uL (ref 4.0–10.5)

## 2021-02-23 LAB — SEDIMENTATION RATE: Sed Rate: 39 mm/hr — ABNORMAL HIGH (ref 0–30)

## 2021-02-23 MED ORDER — PANTOPRAZOLE SODIUM 40 MG PO TBEC: 40.0000 mg | DELAYED_RELEASE_TABLET | Freq: Two times a day (BID) | ORAL | 1 refills | Status: DC

## 2021-02-23 MED ORDER — LUBIPROSTONE 24 MCG PO CAPS: 24.0000 ug | ORAL_CAPSULE | Freq: Two times a day (BID) | ORAL | 3 refills | Status: DC

## 2021-02-23 NOTE — Patient Instructions (Addendum)
Si tiene 65 aos o ms, su ndice de [employer] corporal debe estar entre 23 y 95. Su ndice de masa corporal es de 23,64 kg/m. Si esto est fuera del rango mencionado anteriormente, considere hacer un seguimiento con su proveedor de Midwife.  Si tiene 25 aos o menos, su ndice de [employer] corporal debe estar entre 29 y 18. Su ndice de masa corporal es de 23,64 kg/m. Si esto est fuera del rango mencionado anteriormente, considere hacer un seguimiento con su proveedor de Midwife.  Le han programado una endoscopia. Siga las instrucciones escritas que se le dieron en su visita de hoy. Si [place] inhaladores (aunque solo sea necesario), trigalos el da de su procedimiento.   Detener Aciphex y [place].  Hemos enviado los siguientes medicamentos a su farmacia para que los recoja a su conveniencia: Protonix 40 mg [employer] al da, Amitiza 24 mcg [employer].  Su proveedor le ha pedido que vaya al nivel del stano para el trabajo de laboratorio antes de salir hoy. Presione "B" en el ascensor. El laboratorio est ubicado en la primera puerta a la izquierda al salir del Materials engineer.   Los proveedores de Financial controller GI desean alentarlo a que use MYCHART para comunicarse con los proveedores para solicitudes o preguntas que no sean urgentes. Debido a los Astronomer de espera en el telfono, enviar un mensaje a su proveedor por [employer] puede ser una forma ms rpida y eficiente de obtener una respuesta. Espere 48 horas hbiles para obtener [employer]. Recuerde que esto es para solicitudes no urgentes. Fue un placer verte hoy!  Gracias por confiar en m para su cuidado gastrointestinal!  Dr. Nicki Reaper E. Candis Schatz

## 2021-02-23 NOTE — Progress Notes (Signed)
HPI : Leah Pruitt is a pleasant 75 year old female with a history of diabetes who presents to [place] with persistent symptoms of abdominal pain, bloating nausea and vomiting.  The patient primarily speaks Spanish, and her husband is present with her and serves as Optometrist.  Patient has been having abdominal pain, bloating and nausea and vomiting for several weeks now.  It seems to be getting progressively worse.  Pain is more localized in the left lower quadrant.  Pain is worsened when she tries to eat, and thus she has not been eating much for the past few weeks.  She estimates losing 12 pounds over the last few weeks.  She states she has been vomiting about every day.  She does have chronic constipation and this has not changed recently.  She takes oxycodone as needed for back pain, usually only about once a week.  She was recently prescribed Movantik and MiraLAX, but says they do not work for her.  Milk of magnesia does work, she has been taking this daily.  Diarrhea is not a problem for her.  She has not noticed any blood in her stool.  She has never had a colonoscopy.  She has chronic GERD symptoms, and is taking Aciphex twice a day for a long time.  She has never had an upper endoscopy.  She has been taking Phenergan as needed for the nausea and vomiting. In addition to the GI symptoms, she is also been having persistent left-sided headache pain is localized to the left temple and has been constant for weeks.  She was hospitalized in May of this year with abdominal pain and was found to have an acute kidney injury with evidence of hydronephrosis as well as mild anemia.  The discharge summary suggest that she has had problems with left lower quadrant abdominal pain intermittently years, and has been prescribed ciprofloxacin by her for presumed diverticulitis.  She has a CT in 2016 to evaluate left lower abdominal pain which was unremarkable except for focal suspected inflammation of the transverse  colon.  CT on admission did not show any evidence of diverticulitis, and it was postulated that maybe her had been due to a passed renal stone.   Past Medical History:  Diagnosis Date   Appendicitis, acute 11/19/2012   Arthritis    Back pain    Cardiac arrhythmia    Diabetes ([place])    Diverticulosis    FH: cholecystectomy    Gastroesophageal reflux disease    GERD (gastroesophageal reflux disease) 11/19/2012   Headache    HTN (hypertension)    Lipoma    Scalp   Migraines    Osteoporosis    Unspecified essential hypertension 11/19/2012     Past Surgical History:  Procedure Laterality Date   CESAREAN SECTION     CHOLECYSTECTOMY     LAPAROSCOPIC APPENDECTOMY N/A 11/16/2012   Procedure: APPENDECTOMY LAPAROSCOPIC;  Surgeon: Imogene Burn. Georgette Dover, MD;  Location: [facility];  Service: General;  Laterality: N/A;   LAPAROSCOPIC LYSIS OF ADHESIONS Bilateral 11/16/2012   Procedure: LAPAROSCOPIC LYSIS OF ADHESIONS;  Surgeon: Imogene Burn. Georgette Dover, MD;  Location: [facility];  Service: General;  Laterality: Bilateral;   TONSILLECTOMY     Family History  Problem Relation Age of Onset   Stroke Mother 64       deceased   Hypertension Mother    Diabetes Mellitus I Mother    Lung cancer Father 53       deceased   Hypertension Sister  Uterine cancer Sister    Stomach cancer Brother    Social History   Tobacco Use   Smoking status: Never   Smokeless tobacco: Never  Vaping Use   Vaping Use: Never used  Substance Use Topics   Alcohol use: No   Drug use: No   Current Outpatient Medications  Medication Sig Dispense Refill   BENICAR 20 MG tablet Take 20 mg by mouth 2 (two) times daily.     Calcium Carbonate-Vitamin D 600-200 MG-UNIT TABS Take 1 tablet by mouth 2 (two) times daily with a meal.     CARAFATE 1 g tablet Take 1 g by mouth 2 (two) times daily.     Cholecalciferol 25 MCG (1000 UT) tablet Take 2,000 Units by mouth daily.     diclofenac (VOLTAREN) 25 MG EC tablet Take 25 mg by mouth 2 (two)  times daily.     diltiazem (TIAZAC) 120 MG 24 hr capsule Do not take till you talk with your Medical doctor about her blood pressure (Patient taking differently: Take 120 mg by mouth daily. Do not take till you talk with your Medical doctor about her blood pressure)     furosemide (LASIX) 20 MG tablet Take 20 mg by mouth as needed for fluid.  4   GLUMETZA 500 MG 24 hr tablet Take 500 mg by mouth 3 (three) times daily.     magnesium hydroxide (MILK OF MAGNESIA) 800 MG/5ML suspension Take 30 mLs by mouth daily.     meclizine (ANTIVERT) 25 MG tablet Take 25 mg by mouth daily as needed for dizziness.     MOVANTIK 12.5 MG TABS tablet Take 12.5 mg by mouth every morning.     oxyCODONE (OXY IR/ROXICODONE) 5 MG immediate release tablet Take 5 mg by mouth every 8 (eight) hours as needed.     polyethylene glycol (MIRALAX) 17 g packet Take 17 g by mouth 2 (two) times daily as needed. 14 each 0   promethazine (PHENERGAN) 25 MG tablet Take 25 mg by mouth every 8 (eight) hours as needed for nausea or vomiting.     RABEprazole Sodium (ACIPHEX PO) Take 20 mg by mouth in the morning and at bedtime.     traMADol (ULTRAM) 50 MG tablet Take 1-2 tablets (50-100 mg total) by mouth 3 (three) times daily as needed. (Patient taking differently: Take 50-100 mg by mouth 3 (three) times daily as needed for moderate pain.) 50 tablet 0   nitroGLYCERIN (NITROSTAT) 0.4 MG SL tablet Place 0.4 mg under the tongue every 5 (five) minutes as needed for chest pain. (Patient not taking: Reported on 02/23/2021)     No current facility-administered medications for this visit.   Allergies  Allergen Reactions   Naratriptan Anaphylaxis    Rash, difficult time breathing and swallowing   Penicillins Other (See Comments)    Numbness of mouth and dry mouth   Iodine Swelling   Ciprofloxacin     Other reaction(s): Other (See Comments) Tendon pain   Penicillin G Rash     Review of Systems: All systems reviewed and negative except where  noted in HPI.    No results found.  Physical Exam: BP 140/80 (BP Location: Left Arm, Patient Position: Sitting, Cuff Size: Normal)   Pulse (!) 104   Ht $R'5\' 1"'vr$  (1.549 m)   Wt 125 lb 2 oz (56.8 kg)   BMI 23.64 kg/m  Constitutional: Pleasant,well-developed, female in no acute distress.  Accompanied by husband who translates HEENT: Normocephalic and atraumatic.  Conjunctivae are normal. No scleral icterus.  Prominent vessel of her left temple, nontender Cardiovascular: Normal rate, regular rhythm.  Pulmonary/chest: Effort normal and breath sounds normal. No wheezing, rales or rhonchi. Abdominal: Soft, nondistended, tenderness to palpation in the left lower quadrant, no rigidity, guarding or rebound tenderness. Bowel sounds active throughout. There are no masses palpable. No hepatomegaly. Extremities: no edema Neurological: Alert and oriented to person place and time. Skin: Skin is warm and dry. No rashes noted. Psychiatric: Normal mood and affect. Behavior is normal.  CBC    Component Value Date/Time   WBC 5.3 08/22/2020 0731   RBC 3.78 (L) 08/22/2020 0731   HGB 10.8 (L) 08/22/2020 0731   HCT 33.1 (L) 08/22/2020 0731   PLT 204 08/22/2020 0731   MCV 87.6 08/22/2020 0731   MCH 28.6 08/22/2020 0731   MCHC 32.6 08/22/2020 0731   RDW 14.2 08/22/2020 0731   LYMPHSABS 1.0 10/11/2016 1100   MONOABS 0.4 10/11/2016 1100   EOSABS 0.1 10/11/2016 1100   BASOSABS 0.0 10/11/2016 1100    CMP     Component Value Date/Time   NA 141 08/22/2020 0731   K 4.3 08/22/2020 0731   CL 108 08/22/2020 0731   CO2 26 08/22/2020 0731   GLUCOSE 115 (H) 08/22/2020 0731   BUN 14 08/22/2020 0731   CREATININE 1.61 (H) 08/22/2020 0731   CALCIUM 9.3 08/22/2020 0731   PROT 6.9 08/20/2020 1151   ALBUMIN 4.2 08/20/2020 1151   AST 19 08/20/2020 1151   ALT 15 08/20/2020 1151   ALKPHOS 208 (H) 08/20/2020 1151   BILITOT 0.6 08/20/2020 1151   GFRNONAA 34 (L) 08/22/2020 0731   GFRAA >60 10/11/2016 1100    CLINICAL DATA:  Diffuse abdominal pain.   EXAM:  CT ABDOMEN AND PELVIS WITHOUT CONTRAST   TECHNIQUE:  Multidetector CT imaging of the abdomen and pelvis was performed  following the standard protocol without intravenous contrast.   COMPARISON:  None.   FINDINGS:  The examination is limited by the lack of intravenous contrast. The  appendix is markedly enlarged and contains an appendicolith. The  appendix measures 12 mm in maximum diameter on image number 56.  Periappendiceal soft tissue stranding is noted. Also noted are  multiple mildly enlarged lymph nodes in the right lower abdominal  mesentery.   No fluid collections or free peritoneal air. Unremarkable uterus,  ovaries, urinary bladder, kidneys, liver, spleen and pancreas.  Normal appearing adrenal glands. Cholecystectomy clips. Multiple  colonic diverticula without evidence of diverticulitis. Minimal  bibasilar lung atelectasis or scarring. Right L5 pars  interarticularis defect with minimal anterolisthesis at the L5-S1  level. The left L5 pars is intact.   IMPRESSION:  1. Acute appendicitis without abscess.  2. Mild reactive mesenteric adenopathy in the right lower abdomen.  3. Right L5 spondylolysis with minimal grade 1 spondylolisthesis at  the L5-S1 level.  These results were called by telephone on 11/16/2012 at 0104 hours to  Antonietta Breach, P.A., who verbally acknowledged these results.    Electronically Signed    By: Enrique Sack    On: 11/16/2012 01:04   CLINICAL DATA:  Abdominal pain, periumbilical pain radiating to left lower quadrant.   History of cholecystectomy, appendectomy and diabetes.   EXAM: CT ABDOMEN AND PELVIS WITHOUT CONTRAST   TECHNIQUE: Multidetector CT imaging of the abdomen and pelvis was performed following the standard protocol without IV contrast.   COMPARISON:  CT abdomen dated 11/16/2012.   FINDINGS: Lower chest:  No acute findings.  Hepatobiliary: Status post  cholecystectomy. No mass visualized within the liver on this unenhanced exam.   Pancreas: Partially infiltrated with fat but otherwise unremarkable.   Spleen: Within normal limits in size.   Adrenals/Urinary Tract: Adrenal glands appear normal. Kidneys are unremarkable without stone or hydronephrosis. No ureteral or bladder calculi identified.   Stomach/Bowel: Distal transverse colon, with extension into the surrounding paracolic fat. Remainder of the bowel is unremarkable. No dilated large or small bowel loops. Patient is status post appendectomy. Stomach appears normal.   Vascular/Lymphatic: No enlarged lymph nodes seen. Abdominal aorta is normal in caliber.   Reproductive: Unremarkable.   Other: No abscess collection or free intraperitoneal air.   Musculoskeletal: No acute or suspicious osseous lesion. Degenerative changes of the thoracolumbar spine, mild to moderate in degree. Superficial soft tissues are unremarkable.   IMPRESSION: 1. Masslike thickening of the walls of the distal transverse colon. While this could represent a focal inflammatory or infectious colitis, possibly even diverticulitis (several diverticula are seen in the area), it is most suspicious for colon cancer. No associated bowel obstruction. 2. Remainder of the abdomen and pelvis CT is unremarkable, as detailed above. Ordering physician was not immediately available for verbal report.     Electronically Signed   By: Franki Cabot M.D.   On: 06/26/2015 22:47  CLINICAL DATA:  Left lower quadrant pain, history of diverticulitis.   EXAM: CT ABDOMEN AND PELVIS WITHOUT CONTRAST   TECHNIQUE: Multidetector CT imaging of the abdomen and pelvis was performed following the standard protocol without IV contrast.   COMPARISON:  06/28/2019   FINDINGS: Lower chest: No acute abnormality.   Hepatobiliary: No focal liver abnormality is seen. Status post cholecystectomy. No biliary dilatation.    Pancreas: Unremarkable. No pancreatic ductal dilatation or surrounding inflammatory changes.   Spleen: Normal in size without focal abnormality.   Adrenals/Urinary Tract: Adrenal glands are within normal limits. Kidneys are well visualized bilaterally. Punctate nonobstructing right renal stone is noted. Mildly prominent extrarenal pelvis is noted. Prominence of the right ureter is seen to the level of the iliac artery without definitive obstructive change or calculus. This may be related to some extrinsic compression by the iliac artery. Prominent extrarenal pelvis and hydronephrosis is noted on the left. This is new from the prior exam although no ureteral obstructive changes are seen. Bladder is well distended.   Stomach/Bowel: The appendix is not well visualized consistent with a prior surgical history. No obstructive or inflammatory changes of the large or small bowel are seen. Mild diverticular changes noted. Stomach is within normal limits.   Vascular/Lymphatic: Aortic atherosclerosis. No enlarged abdominal or pelvic lymph nodes.   Reproductive: Uterus and bilateral adnexa are unremarkable.   Other: No abdominal wall hernia or abnormality. No abdominopelvic ascites.   Musculoskeletal: Mild degenerative changes of the lumbar spine are noted with anterolisthesis of L5 on S1.   IMPRESSION: Status post cholecystectomy and appendectomy.   Bilateral prominent renal collecting systems without definitive obstructing stone. These changes on the right are likely related to crossing over iliac artery as the more distal ureter is within normal limits. The changes on the left may be related to a degree of UPJ obstruction.     Electronically Signed   By: Inez Catalina M.D.   On: 08/20/2020 17:00   ASSESSMENT AND PLAN: 75 year old female with history of diabetes with reported history of several weeks of left lower quadrant abdominal pain, accompanied by nausea, vomiting and  weight loss.  The left lower  quadrant pain apparently is a recurring chronic problem, but it seems the nausea and vomiting are new, as is weight loss.  She does have a history of GERD and is taking Aciphex twice a day without improvement in symptoms.  It is possible that many of her symptoms may just be due to uncontrolled constipation, however given her unintentional weight loss and age, endoscopic evaluation is indicated.  I recommended upper and lower endoscopy.  The patient was very hesitant to undergo a colonoscopy.  I suggested pursuing at least an upper endoscopy, if her symptoms do not improve and an upper endoscopy is unrevealing for any cause of her symptoms, then we can reconsider a colonoscopy.  I suggested at least getting a Cologuard as she has never had any colon cancer screening, and she was agreeable to this. In the meantime, recommend we switch her laxative to Amitiza 24 mcg twice a day, and switch her PPI from Aciphex to Protonix 40 mg twice a day.  Given that she was found to have a new anemia in May, CBC.  We will also repeat CMP.  Given her persistent temporal headache, would like to get ESR to evaluate for giant cell arteritis  Abdominal pain, nausea/vomiting - EGD  GERD -Switch to Protonix 40 mg twice a day  Constipation -Amitiza 24 mcg twice a day  Colon cancer screening -Cologuard, pt declined colonoscopy  Headache -ESR, CRP  Anemia -Repeat CBC  The details, risks (including bleeding, perforation, infection, missed lesions, medication reactions and possible hospitalization or surgery if complications occur), benefits, and alternatives to EGD with possible biopsy and possible dilation were discussed with the patient and she consents to proceed.   Marcas Bowsher E. Candis Schatz, MD [facility]   No ref. provider found

## 2021-02-28 NOTE — Progress Notes (Signed)
Leah Pruitt,  Overall, your labs were essentially normal.  Your ESR was elevated, but when adjusted for your age, is within normal limits.  Your CRP was normal.  Your alkaline phosphatase was very mildly elevated.  This is an enzyme found in liver and bone tissue.  This was more elevated in May and is likely related to bone turnover, not a liver problem (based on a normal GGT in May).  The rest of your liver enzymes were completely normal. Your kidney function is much better compared to May and you have normal electrolytes. Your blood counts are stable. No further action is needed based on these labs.  Please proceed with the upper endoscopy as planned.

## 2021-03-02 ENCOUNTER — Telehealth: Payer: Self-pay | Admitting: Gastroenterology

## 2021-03-02 ENCOUNTER — Other Ambulatory Visit: Payer: Self-pay

## 2021-03-02 DIAGNOSIS — Z1211 Encounter for screening for malignant neoplasm of colon: Secondary | ICD-10-CM | POA: Diagnosis not present

## 2021-03-02 DIAGNOSIS — Z1212 Encounter for screening for malignant neoplasm of rectum: Secondary | ICD-10-CM | POA: Diagnosis not present

## 2021-03-02 MED ORDER — PANTOPRAZOLE SODIUM 40 MG PO TBEC: 40.0000 mg | DELAYED_RELEASE_TABLET | Freq: Two times a day (BID) | ORAL | 1 refills | Status: DC

## 2021-03-02 NOTE — Telephone Encounter (Signed)
Resent Pantoprazole to patients pharmacy.

## 2021-03-02 NOTE — Telephone Encounter (Signed)
Patient's husband called and said that omeprazole was called into the pharmacy instead of pantoprazole and asked if we could call the pharmacy and correct the prescription, today, if possible.  Thank you,.

## 2021-03-09 LAB — COLOGUARD: COLOGUARD: POSITIVE — AB

## 2021-03-11 NOTE — Progress Notes (Signed)
Leah Pruitt,  Please let [name] know that her Cologuard test was positive.  We will need to perform a colonoscopy in addition to the upper endoscopy.  I am okay with adding the colonoscopy to the patient's scheduled EGD, or she can schedule the colonoscopy for a separate date.

## 2021-03-14 ENCOUNTER — Telehealth: Payer: Self-pay | Admitting: Gastroenterology

## 2021-03-14 NOTE — Telephone Encounter (Signed)
Patient's husband returned your phone call for patient.  Please call him back.  Thank you.

## 2021-03-14 NOTE — Telephone Encounter (Signed)
Pt and her husband returned call to review Cologuard results. We have reviewed her results and recommendations. Pt's wife would like to think about having the colonoscopy. I offered to schedule both procedures at the same time on a different day, pt's wife states that she has had a lot of testing like MRI and CT scans. Advised that with the colonoscopy the doctor can take biopsies and check for polyps, etc. Pt's husband is aware that we have the colonoscopy appt scheduled for 04/14/21. Pt's husband states that he will talk with this wife and give [place] a call back.

## 2021-03-15 DIAGNOSIS — K8689 Other specified diseases of pancreas: Secondary | ICD-10-CM | POA: Diagnosis not present

## 2021-03-15 DIAGNOSIS — K5730 Diverticulosis of large intestine without perforation or abscess without bleeding: Secondary | ICD-10-CM | POA: Diagnosis not present

## 2021-03-15 DIAGNOSIS — R1013 Epigastric pain: Secondary | ICD-10-CM | POA: Diagnosis not present

## 2021-03-15 DIAGNOSIS — N261 Atrophy of kidney (terminal): Secondary | ICD-10-CM | POA: Diagnosis not present

## 2021-03-15 DIAGNOSIS — N1330 Unspecified hydronephrosis: Secondary | ICD-10-CM | POA: Diagnosis not present

## 2021-03-16 DIAGNOSIS — N1330 Unspecified hydronephrosis: Secondary | ICD-10-CM | POA: Diagnosis not present

## 2021-03-16 DIAGNOSIS — K869 Disease of pancreas, unspecified: Secondary | ICD-10-CM | POA: Diagnosis not present

## 2021-03-16 DIAGNOSIS — K8689 Other specified diseases of pancreas: Secondary | ICD-10-CM | POA: Diagnosis not present

## 2021-03-16 DIAGNOSIS — K315 Obstruction of duodenum: Secondary | ICD-10-CM | POA: Diagnosis not present

## 2021-03-16 DIAGNOSIS — K3189 Other diseases of stomach and duodenum: Secondary | ICD-10-CM | POA: Diagnosis not present

## 2021-03-18 ENCOUNTER — Other Ambulatory Visit: Payer: Self-pay | Admitting: *Deleted

## 2021-03-18 ENCOUNTER — Encounter: Payer: Self-pay | Admitting: *Deleted

## 2021-03-18 ENCOUNTER — Ambulatory Visit
Admission: RE | Admit: 2021-03-18 | Discharge: 2021-03-18 | Disposition: A | Discharge status: Home / Self Care | Payer: Self-pay | Source: Ambulatory Visit | Attending: Oncology | Admitting: Oncology

## 2021-03-18 DIAGNOSIS — K8689 Other specified diseases of pancreas: Secondary | ICD-10-CM

## 2021-03-18 NOTE — Progress Notes (Signed)
Request sent for outside MR and CT from [facility]

## 2021-03-18 NOTE — Progress Notes (Signed)
PATIENT NAVIGATOR PROGRESS NOTE  Name: Leah Pruitt Date: 03/18/2021 MRN: [id number]  DOB: [birth year]   Reason for visit:  Introductory phone call  Comments:  Spoke with husband and discussed referral from Dr Venetia Maxon,  New Patient appt scheduled for 04/01/21 at 2:15 with Ned Card NP. She is scheduled to have EGD/colonoscopy on 04/11/21 with Dr Candis Schatz.   Orders placed for outside films to be uploaded into Epic system    Time spent counseling/coordinating care: 15-30 minutes

## 2021-03-22 ENCOUNTER — Encounter: Payer: Medicare Other | Admitting: Gastroenterology

## 2021-03-22 ENCOUNTER — Encounter (HOSPITAL_COMMUNITY): Payer: Self-pay | Admitting: Emergency Medicine

## 2021-03-22 ENCOUNTER — Inpatient Hospital Stay (HOSPITAL_COMMUNITY)
Admission: EM | Admit: 2021-03-22 | Discharge: 2021-03-24 | DRG: 438 | Discharge status: Against Medical Advice | Payer: Medicare Other | Attending: Internal Medicine | Admitting: Internal Medicine

## 2021-03-22 ENCOUNTER — Other Ambulatory Visit: Payer: Self-pay

## 2021-03-22 ENCOUNTER — Emergency Department (HOSPITAL_COMMUNITY): Payer: Medicare Other

## 2021-03-22 ENCOUNTER — Inpatient Hospital Stay (HOSPITAL_COMMUNITY): Payer: Medicare Other

## 2021-03-22 ENCOUNTER — Telehealth: Payer: Self-pay

## 2021-03-22 DIAGNOSIS — Z4682 Encounter for fitting and adjustment of non-vascular catheter: Secondary | ICD-10-CM | POA: Diagnosis not present

## 2021-03-22 DIAGNOSIS — Z91041 Radiographic dye allergy status: Secondary | ICD-10-CM | POA: Diagnosis not present

## 2021-03-22 DIAGNOSIS — I10 Essential (primary) hypertension: Secondary | ICD-10-CM | POA: Diagnosis present

## 2021-03-22 DIAGNOSIS — K56600 Partial intestinal obstruction, unspecified as to cause: Secondary | ICD-10-CM | POA: Diagnosis not present

## 2021-03-22 DIAGNOSIS — Z9049 Acquired absence of other specified parts of digestive tract: Secondary | ICD-10-CM | POA: Diagnosis not present

## 2021-03-22 DIAGNOSIS — K6389 Other specified diseases of intestine: Secondary | ICD-10-CM | POA: Diagnosis not present

## 2021-03-22 DIAGNOSIS — K219 Gastro-esophageal reflux disease without esophagitis: Secondary | ICD-10-CM | POA: Diagnosis present

## 2021-03-22 DIAGNOSIS — K8689 Other specified diseases of pancreas: Principal | ICD-10-CM

## 2021-03-22 DIAGNOSIS — E119 Type 2 diabetes mellitus without complications: Secondary | ICD-10-CM

## 2021-03-22 DIAGNOSIS — K311 Adult hypertrophic pyloric stenosis: Secondary | ICD-10-CM | POA: Diagnosis not present

## 2021-03-22 DIAGNOSIS — Z823 Family history of stroke: Secondary | ICD-10-CM | POA: Diagnosis not present

## 2021-03-22 DIAGNOSIS — Z20822 Contact with and (suspected) exposure to covid-19: Secondary | ICD-10-CM | POA: Diagnosis present

## 2021-03-22 DIAGNOSIS — K5669 Other partial intestinal obstruction: Secondary | ICD-10-CM | POA: Diagnosis present

## 2021-03-22 DIAGNOSIS — K56609 Unspecified intestinal obstruction, unspecified as to partial versus complete obstruction: Secondary | ICD-10-CM | POA: Diagnosis not present

## 2021-03-22 DIAGNOSIS — Z833 Family history of diabetes mellitus: Secondary | ICD-10-CM | POA: Diagnosis not present

## 2021-03-22 DIAGNOSIS — R112 Nausea with vomiting, unspecified: Secondary | ICD-10-CM | POA: Diagnosis not present

## 2021-03-22 DIAGNOSIS — Z8049 Family history of malignant neoplasm of other genital organs: Secondary | ICD-10-CM

## 2021-03-22 DIAGNOSIS — N1330 Unspecified hydronephrosis: Secondary | ICD-10-CM | POA: Diagnosis present

## 2021-03-22 DIAGNOSIS — D490 Neoplasm of unspecified behavior of digestive system: Secondary | ICD-10-CM | POA: Diagnosis not present

## 2021-03-22 DIAGNOSIS — Z8249 Family history of ischemic heart disease and other diseases of the circulatory system: Secondary | ICD-10-CM | POA: Diagnosis not present

## 2021-03-22 DIAGNOSIS — D6489 Other specified anemias: Secondary | ICD-10-CM | POA: Diagnosis present

## 2021-03-22 DIAGNOSIS — E43 Unspecified severe protein-calorie malnutrition: Secondary | ICD-10-CM | POA: Diagnosis present

## 2021-03-22 DIAGNOSIS — K5730 Diverticulosis of large intestine without perforation or abscess without bleeding: Secondary | ICD-10-CM | POA: Diagnosis not present

## 2021-03-22 DIAGNOSIS — M810 Age-related osteoporosis without current pathological fracture: Secondary | ICD-10-CM | POA: Diagnosis present

## 2021-03-22 DIAGNOSIS — Z801 Family history of malignant neoplasm of trachea, bronchus and lung: Secondary | ICD-10-CM

## 2021-03-22 DIAGNOSIS — Z800 Family history of malignant neoplasm of digestive organs: Secondary | ICD-10-CM | POA: Diagnosis not present

## 2021-03-22 LAB — COMPREHENSIVE METABOLIC PANEL
ALT: 14 U/L (ref 0–44)
AST: 16 U/L (ref 15–41)
Albumin: 4.2 g/dL (ref 3.5–5.0)
Alkaline Phosphatase: 100 U/L (ref 38–126)
Anion gap: 7 (ref 5–15)
BUN: 13 mg/dL (ref 8–23)
CO2: 28 mmol/L (ref 22–32)
Calcium: 10.1 mg/dL (ref 8.9–10.3)
Chloride: 98 mmol/L (ref 98–111)
Creatinine, Ser: 1.14 mg/dL — ABNORMAL HIGH (ref 0.44–1.00)
GFR, Estimated: 51 mL/min — ABNORMAL LOW (ref >60)
Glucose, Bld: 134 mg/dL — ABNORMAL HIGH (ref 70–99)
Potassium: 4.1 mmol/L (ref 3.5–5.1)
Sodium: 133 mmol/L — ABNORMAL LOW (ref 135–145)
Total Bilirubin: 0.7 mg/dL (ref 0.3–1.2)
Total Protein: 7.7 g/dL (ref 6.5–8.1)

## 2021-03-22 LAB — CBC
HCT: 36.4 % (ref 36.0–46.0)
Hemoglobin: 11.8 g/dL — ABNORMAL LOW (ref 12.0–15.0)
MCH: 29.4 pg (ref 26.0–34.0)
MCHC: 32.4 g/dL (ref 30.0–36.0)
MCV: 90.8 fL (ref 80.0–100.0)
Platelets: 243 10*3/uL (ref 150–400)
RBC: 4.01 MIL/uL (ref 3.87–5.11)
RDW: 13.1 % (ref 11.5–15.5)
WBC: 5.7 10*3/uL (ref 4.0–10.5)
nRBC: 0 % (ref 0.0–0.2)

## 2021-03-22 LAB — GLUCOSE, CAPILLARY: Glucose-Capillary: 121 mg/dL — ABNORMAL HIGH (ref 70–99)

## 2021-03-22 LAB — RESP PANEL BY RT-PCR (FLU A&B, COVID) ARPGX2
Influenza A by PCR: NEGATIVE
Influenza B by PCR: NEGATIVE
SARS Coronavirus 2 by RT PCR: NEGATIVE

## 2021-03-22 LAB — LIPASE, BLOOD: Lipase: 34 U/L (ref 11–51)

## 2021-03-22 IMAGING — DX DG ABD PORTABLE 1V
1 series · 1 of 1 positions shown · non-contrast
Comparison: CT [DATE]

CLINICAL DATA: NG tube

EXAM:
PORTABLE ABDOMEN - 1 VIEW

[abdomen kub]
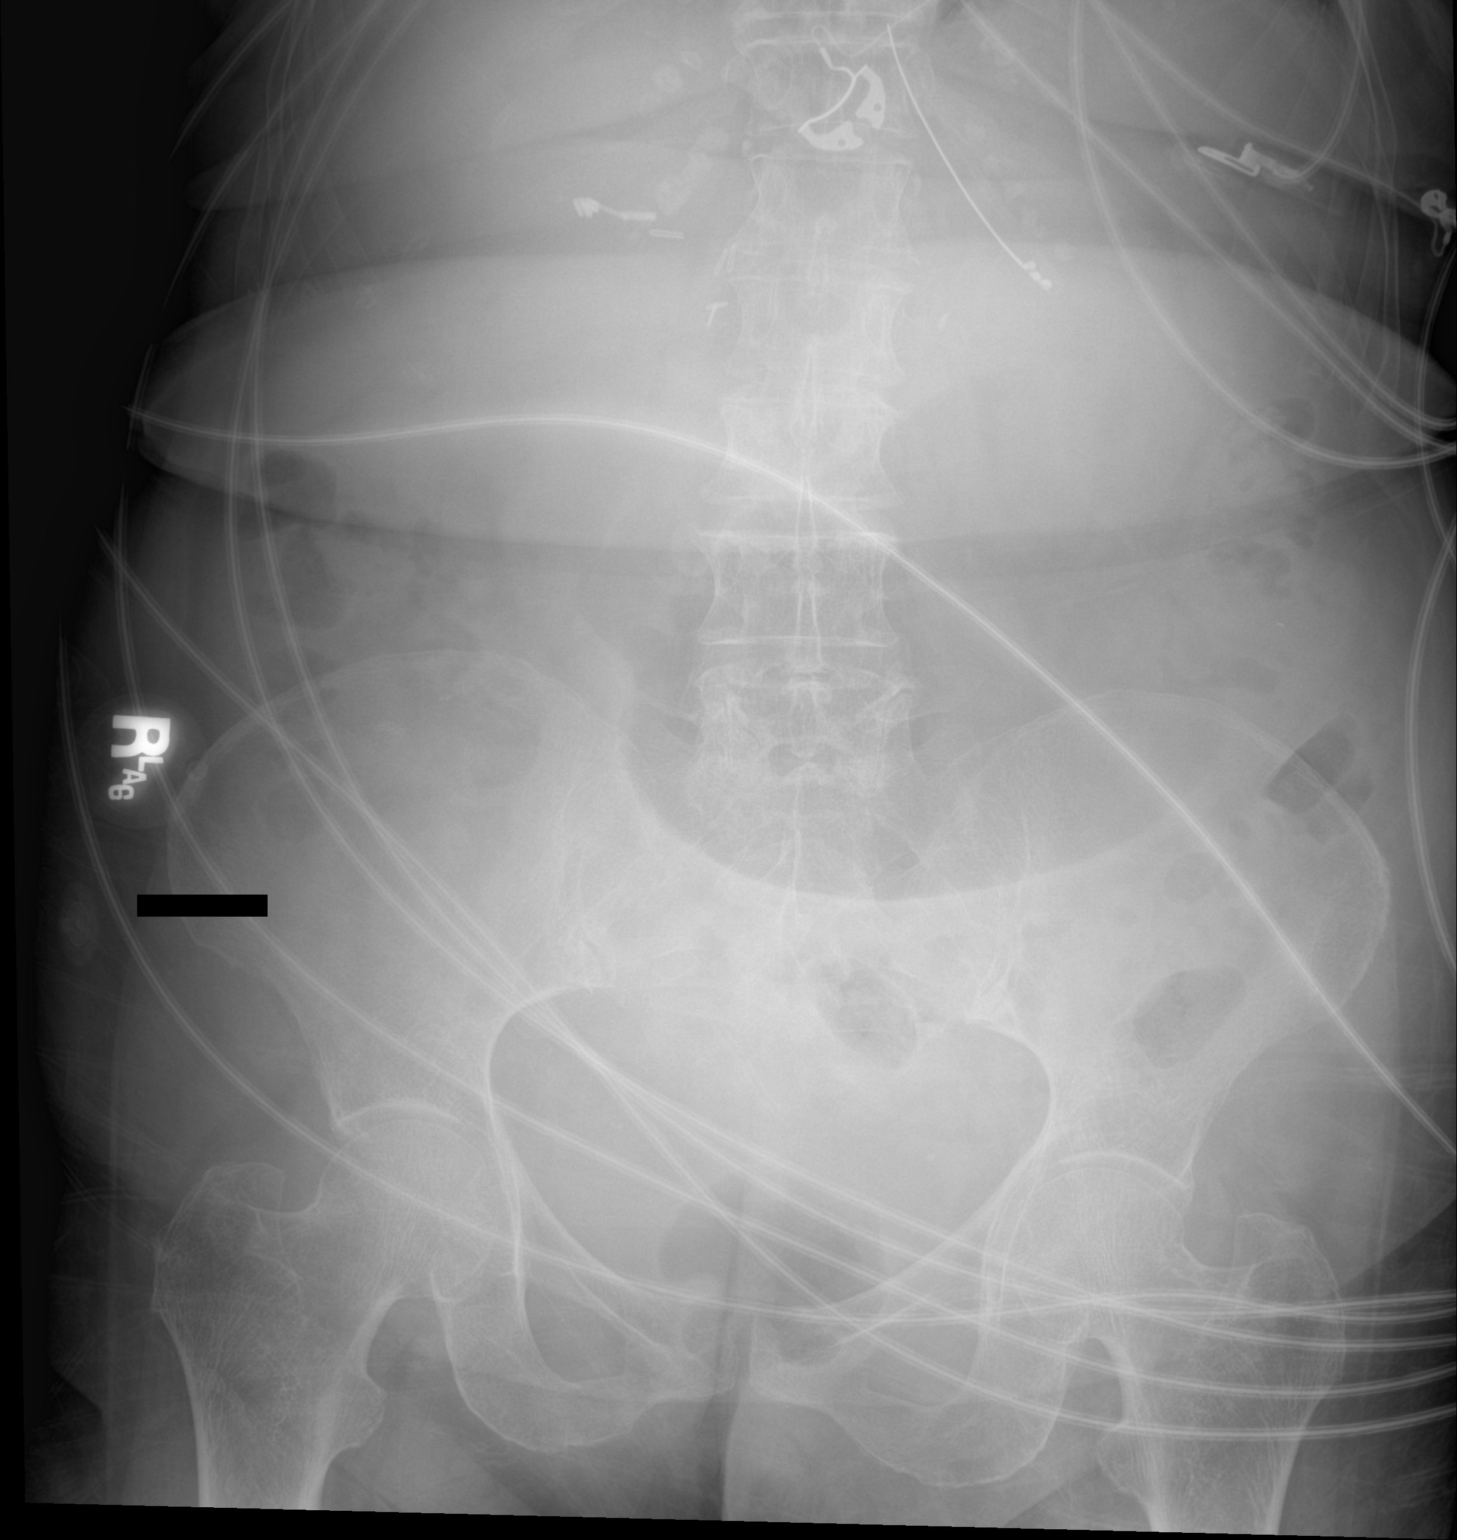

[1 of 1 positions shown; findings below may reference images not displayed]

FINDINGS: Esophageal tube tip overlies the stomach. Suspect side-port is in
the region of GE junction. Gas collection in central abdomen
probably represents dilated stomach.
IMPRESSION: 1. Esophageal tube tip overlies proximal stomach, suspect that the
side port is in the region of GE junction, suggest further
advancement for more optimal positioning.

## 2021-03-22 IMAGING — CT CT ABD-PELV W/O CM
2 of 4 series · 16 of 46 positions shown, 18 images · non-contrast
Comparison: Abdomen pelvis CT, dated [DATE] and MR
abdomen dated [DATE]

CLINICAL DATA: Abdominal pain x3 days.

EXAM:
CT ABDOMEN AND PELVIS WITHOUT CONTRAST
TECHNIQUE: Multidetector CT imaging of the abdomen and pelvis was performed
following the standard protocol without IV contrast.

[Series 2: axial st · axial · 0.73mm/px · z∈[-696,-371]mm · 13 of 75 slices shown, 15 images]
[im 5/75  soft-tissue]
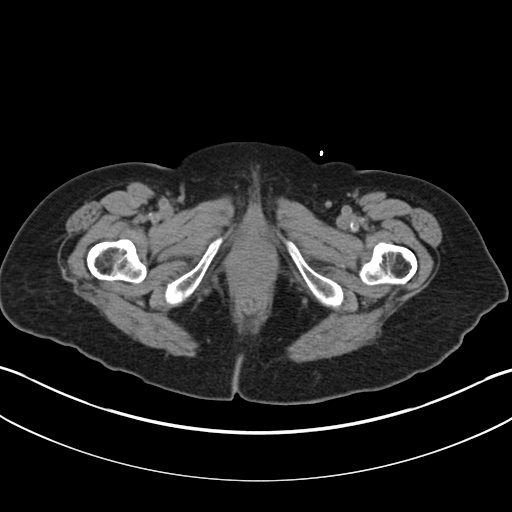
[im 5/75  bone]
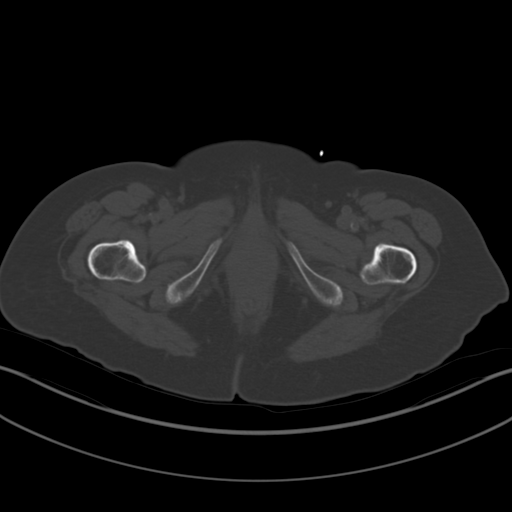
[im 9/75  soft-tissue]
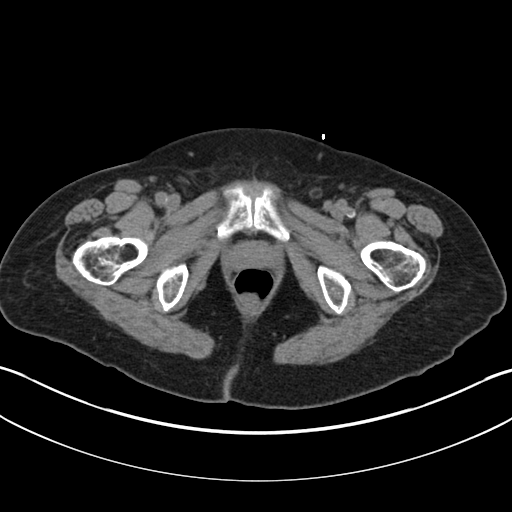
[im 17/75  soft-tissue]
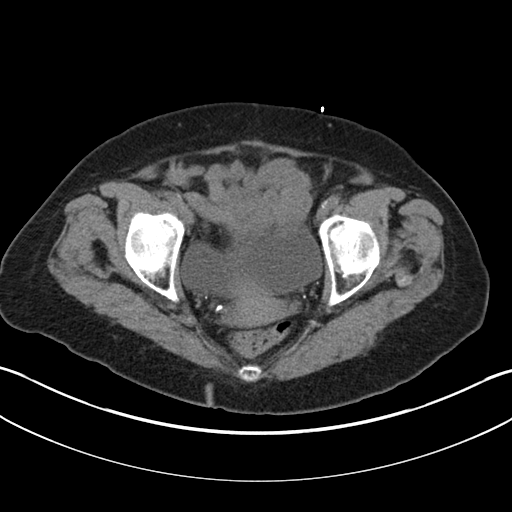
[im 21/75  soft-tissue]
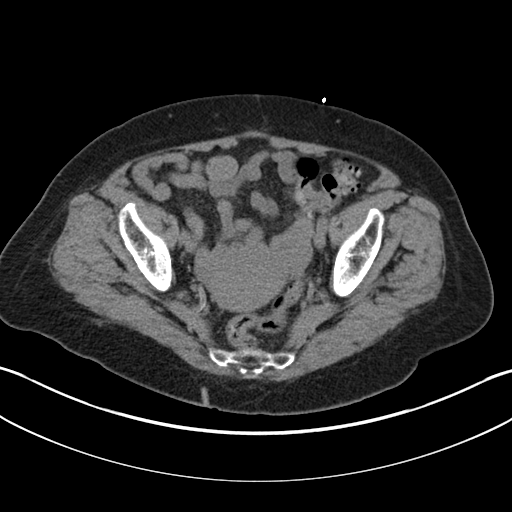
[im 25/75  soft-tissue]
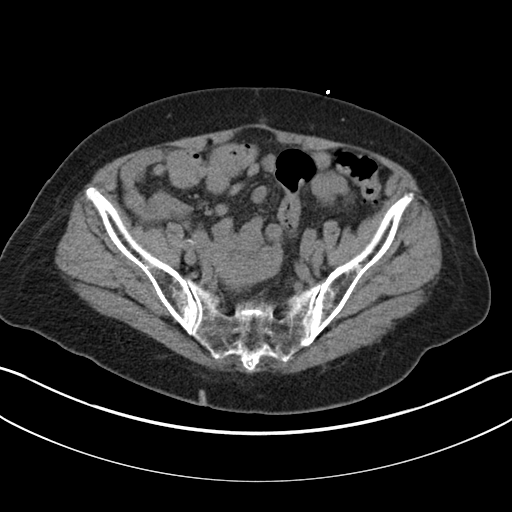
[im 33/75  soft-tissue]
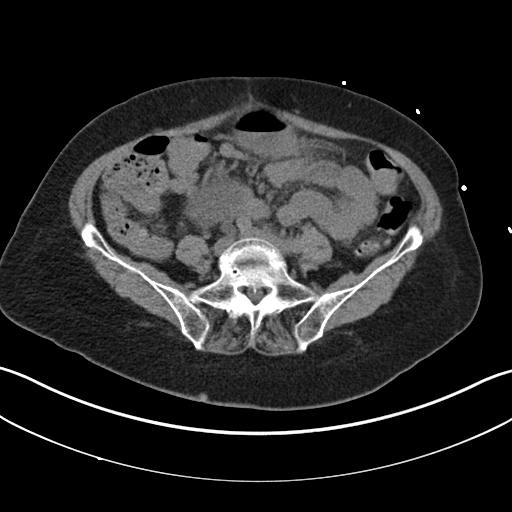
[im 38/75  soft-tissue]
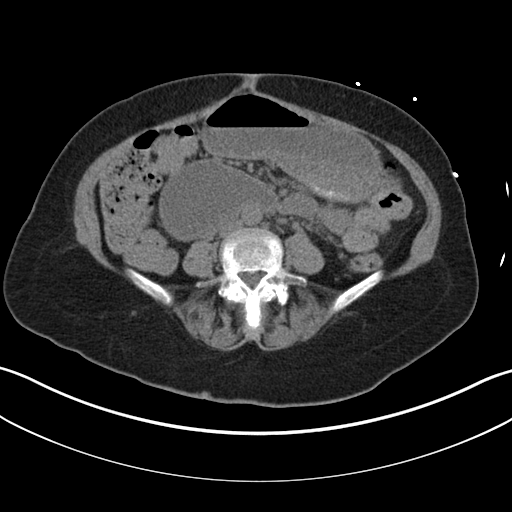
[im 42/75  soft-tissue]
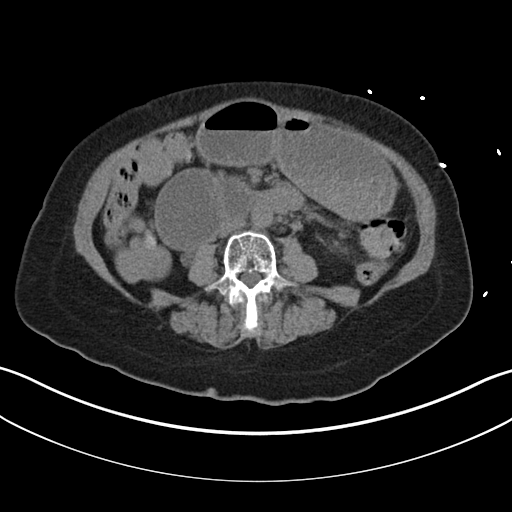
[im 50/75  soft-tissue]
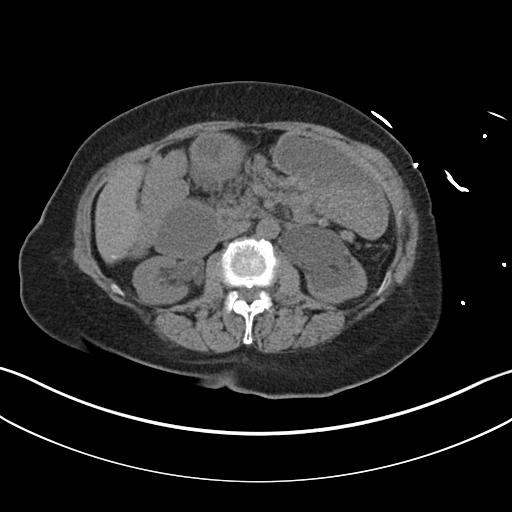
[im 50/75  bone]
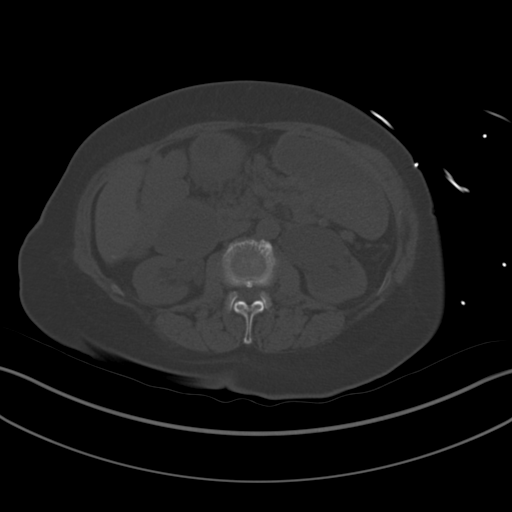
[im 54/75  soft-tissue]
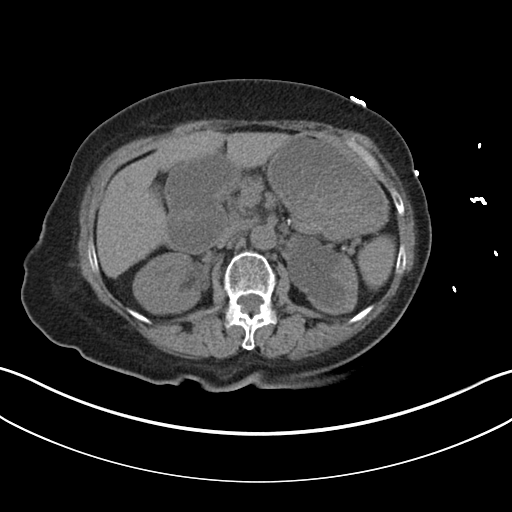
[im 58/75  soft-tissue]
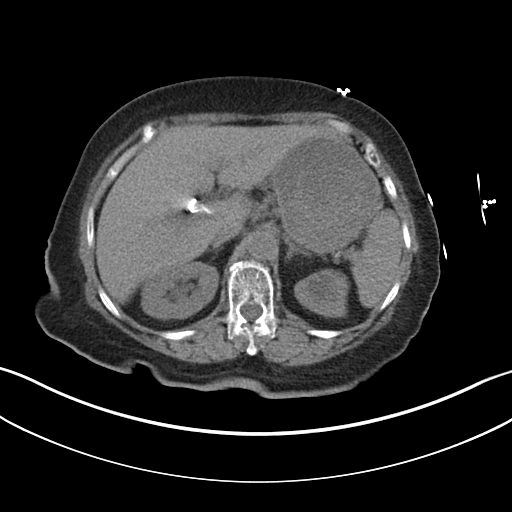
[im 66/75  soft-tissue]
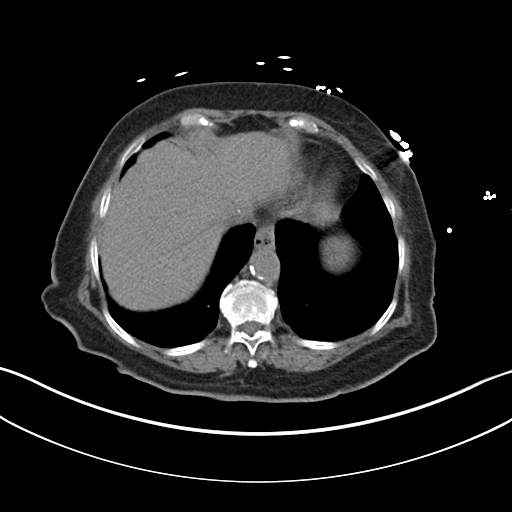
[im 70/75  soft-tissue]
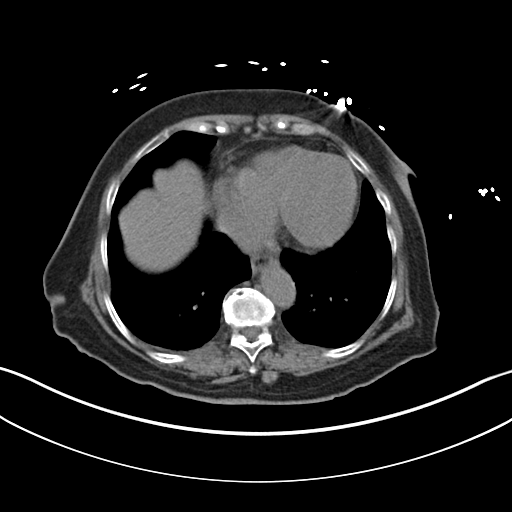

[Series 5: coronal st · coronal · 0.73mm/px · 3 of 126 slices shown]
[im 42/126  soft-tissue]
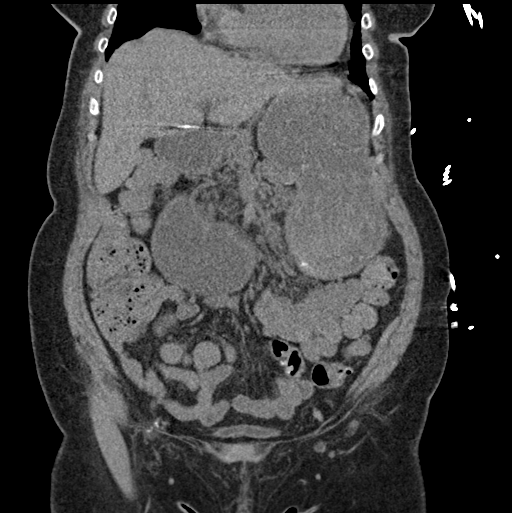
[im 56/126  soft-tissue]
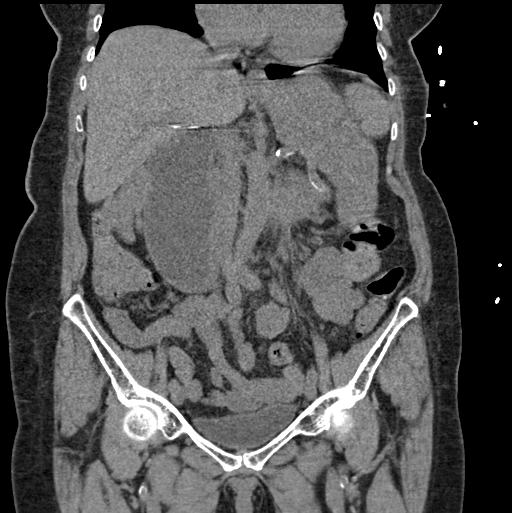
[im 70/126  soft-tissue]
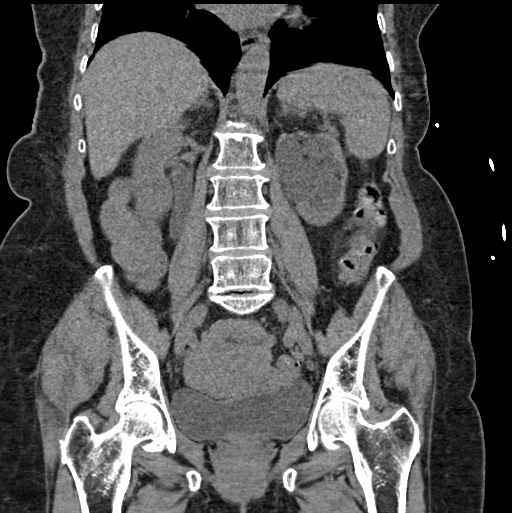

[16 of 46 positions shown; findings below may reference images not displayed]

FINDINGS: Lower chest: No acute abnormality.

Hepatobiliary: No focal liver abnormality is seen. Status post
cholecystectomy. No biliary dilatation.

Pancreas: A 3.6 cm x 3.4 cm x 3.8 cm area of soft tissue attenuation
is again seen along the inferior aspect of the junction of the body
and tail of the pancreas (axial CT images 26 through 32, CT series
2).

Spleen: Normal in size without focal abnormality.

Adrenals/Urinary Tract: Adrenal glands are unremarkable. Moderate
severity atrophic changes are seen involving the left kidney. The
right kidney is normal in size. No focal lesions are identified.
Stable marked severity left-sided hydronephrosis is seen with mild,
stable right-sided hydroureter. Bladder is unremarkable.

Stomach/Bowel: There is moderate to marked severity gastric
distension. The appendix is surgically absent. The proximal portion
of the duodenum is markedly distended (approximately 5.6 cm in
diameter). This is increased in caliber when compared to the prior
study (measured 4.0 cm and diameter on the prior exam (axial CT
image 43, CT series 2). An abrupt transition zone is seen as the
duodenum crosses the midline (axial CT images 34 through 41, CT
series 2). Noninflamed diverticula are noted throughout the large
bowel.

Vascular/Lymphatic: Aortic atherosclerosis. No enlarged abdominal or
pelvic lymph nodes.

Reproductive: Uterus and bilateral adnexa are unremarkable.

Other: No abdominal wall hernia or abnormality. No abdominopelvic
ascites.

Musculoskeletal: Approximate 3 mm anterolisthesis of the L5
vertebral body is noted on S1.

Moderate severity degenerative changes are also seen at this level.
IMPRESSION: 1. Proximal partial small bowel obstruction, secondary to mass
effect from a heterogeneous mass involving the duodenum and
pancreatic tail. This corresponds to findings seen on the prior MR
abdomen.
2. Stable marked severity left-sided hydronephrosis with mild,
stable right-sided hydroureter.
3. Colonic diverticulosis.
4. Aortic atherosclerosis.

Aortic Atherosclerosis ([IC]-[IC]).

## 2021-03-22 MED ORDER — ONDANSETRON HCL 4 MG/2ML IJ SOLN
4.0000 mg | Freq: Four times a day (QID) | INTRAMUSCULAR | Status: DC | PRN
  Administered 2021-03-24: 14:00:00 4 mg via INTRAVENOUS
  Filled 2021-03-22: qty 2

## 2021-03-22 MED ORDER — INSULIN ASPART 100 UNIT/ML IJ SOLN
0.0000 [IU] | Freq: Four times a day (QID) | INTRAMUSCULAR | Status: DC
Start: 2021-03-23 — End: 2021-03-24
  Administered 2021-03-23 (×2): 1 [IU] via SUBCUTANEOUS

## 2021-03-22 MED ORDER — HYDROMORPHONE HCL 1 MG/ML IJ SOLN
0.5000 mg | INTRAMUSCULAR | Status: DC | PRN
  Administered 2021-03-23 (×3): 0.5 mg via INTRAVENOUS
  Filled 2021-03-22 (×3): qty 0.5

## 2021-03-22 MED ORDER — HYDROMORPHONE HCL 1 MG/ML IJ SOLN
0.5000 mg | Freq: Once | INTRAMUSCULAR | Status: AC
  Administered 2021-03-22: 19:00:00 0.5 mg via INTRAVENOUS
  Filled 2021-03-22: qty 1

## 2021-03-22 MED ORDER — PANTOPRAZOLE SODIUM 40 MG IV SOLR
40.0000 mg | Freq: Two times a day (BID) | INTRAVENOUS | Status: DC
  Administered 2021-03-23 – 2021-03-24 (×4): 40 mg via INTRAVENOUS
  Filled 2021-03-22 (×4): qty 40

## 2021-03-22 MED ORDER — CLONIDINE HCL 0.1 MG/24HR TD PTWK
0.1000 mg | MEDICATED_PATCH | TRANSDERMAL | Status: DC
  Administered 2021-03-23: 0.1 mg via TRANSDERMAL
  Filled 2021-03-22: qty 1

## 2021-03-22 MED ORDER — HEPARIN SODIUM (PORCINE) 5000 UNIT/ML IJ SOLN
5000.0000 [IU] | Freq: Three times a day (TID) | INTRAMUSCULAR | Status: DC
  Administered 2021-03-23 – 2021-03-24 (×4): 5000 [IU] via SUBCUTANEOUS
  Filled 2021-03-22 (×4): qty 1

## 2021-03-22 MED ORDER — ONDANSETRON HCL 4 MG/2ML IJ SOLN
4.0000 mg | Freq: Once | INTRAMUSCULAR | Status: AC
  Administered 2021-03-22: 19:00:00 4 mg via INTRAVENOUS
  Filled 2021-03-22: qty 2

## 2021-03-22 MED ORDER — LORAZEPAM 2 MG/ML IJ SOLN
1.0000 mg | Freq: Once | INTRAMUSCULAR | Status: AC
  Administered 2021-03-22: 22:00:00 1 mg via INTRAVENOUS
  Filled 2021-03-22: qty 1

## 2021-03-22 MED ORDER — SODIUM CHLORIDE 0.9 % IV BOLUS
1000.0000 mL | Freq: Once | INTRAVENOUS | Status: AC
  Administered 2021-03-22: 19:00:00 1000 mL via INTRAVENOUS

## 2021-03-22 MED ORDER — LACTATED RINGERS IV SOLN: INTRAVENOUS | Status: DC

## 2021-03-22 NOTE — Assessment & Plan Note (Signed)
Add SSI q6h.

## 2021-03-22 NOTE — ED Provider Notes (Addendum)
[facility] DEPT Provider Note   CSN: [id number] Arrival date & time: 03/22/21  1658     History Chief Complaint  Patient presents with   Abdominal Pain   Nausea   Emesis    Leah Pruitt is a 75 y.o. female.  Patient c/o nausea/vomiting, and epigastric and left abd pain for the past week. Symptoms acute onset, moderate-severe, recurrent, persistent, non radiating. No hx same pain. Indicates was seen at [facility] and told possible pancreas mass. Is having normal bms, including today. No abd distension. No fever or chills. No dysuria or gu c/o. No chest pain or discomfort. No sob. No cough or uri symptoms. States hx migraines, and intermittent dull, frontal headaches. No neck pain or stiffness. No swelling. No specific known covid or flu exposure.   The history is provided by the patient, the spouse and medical records. A language interpreter was used.  Abdominal Pain Associated symptoms: nausea and vomiting   Associated symptoms: no chest pain, no chills, no constipation, no cough, no diarrhea, no dysuria, no fever, no shortness of breath and no sore throat   Emesis Associated symptoms: abdominal pain and headaches   Associated symptoms: no chills, no cough, no diarrhea, no fever and no sore throat       Past Medical History:  Diagnosis Date   Appendicitis, acute 11/19/2012   Arthritis    Back pain    Cardiac arrhythmia    Diabetes ([place])    Diverticulosis    FH: cholecystectomy    Gastroesophageal reflux disease    GERD (gastroesophageal reflux disease) 11/19/2012   Headache    HTN (hypertension)    Lipoma    Scalp   Migraines    Osteoporosis    Unspecified essential hypertension 11/19/2012    Patient Active Problem List   Diagnosis Date Noted   AKI (acute kidney injury) ([place]) 08/20/2020   Unilateral primary osteoarthritis, left knee 10/09/2017   Colles' fracture of right radius, init for clos fx 07/09/2017   Pain in right  wrist 07/09/2017   Abdominal pain    Left lower quadrant pain    Mural thickening of colon    Chest pain at rest 01/02/2013   Palpitations 01/02/2013   Appendicitis, acute 11/19/2012   Essential hypertension 11/19/2012   GERD (gastroesophageal reflux disease) 11/19/2012    Past Surgical History:  Procedure Laterality Date   CESAREAN SECTION     CHOLECYSTECTOMY     LAPAROSCOPIC APPENDECTOMY N/A 11/16/2012   Procedure: APPENDECTOMY LAPAROSCOPIC;  Surgeon: Imogene Burn. Georgette Dover, MD;  Location: [facility];  Service: General;  Laterality: N/A;   LAPAROSCOPIC LYSIS OF ADHESIONS Bilateral 11/16/2012   Procedure: LAPAROSCOPIC LYSIS OF ADHESIONS;  Surgeon: Imogene Burn. Georgette Dover, MD;  Location: [facility];  Service: General;  Laterality: Bilateral;   TONSILLECTOMY       OB History   No obstetric history on file.     Family History  Problem Relation Age of Onset   Stroke Mother 6       deceased   Hypertension Mother    Diabetes Mellitus I Mother    Lung cancer Father 32       deceased   Hypertension Sister    Uterine cancer Sister    Stomach cancer Brother     Social History   Tobacco Use   Smoking status: Never   Smokeless tobacco: Never  Vaping Use   Vaping Use: Never used  Substance Use Topics   Alcohol use: No  Drug use: No    Home Medications Prior to Admission medications   Medication Sig Start Date End Date Taking? Authorizing Provider  BENICAR 20 MG tablet Take 20 mg by mouth 2 (two) times daily. 11/24/20   [provider]  Calcium Carbonate-Vitamin D 600-200 MG-UNIT TABS Take 1 tablet by mouth 2 (two) times daily with a meal.    [provider]  CARAFATE 1 g tablet Take 1 g by mouth 2 (two) times daily. 07/22/20   [provider]  Cholecalciferol 25 MCG (1000 UT) tablet Take 2,000 Units by mouth daily.    [provider]  diclofenac (VOLTAREN) 25 MG EC tablet Take 25 mg by mouth 2 (two) times daily.    [provider]  diltiazem  (TIAZAC) 120 MG 24 hr capsule Do not take till you talk with your Medical doctor about her blood pressure Patient taking differently: Take 120 mg by mouth daily. Do not take till you talk with your Medical doctor about her blood pressure 11/17/12   Earnstine Regal, PA-C  furosemide (LASIX) 20 MG tablet Take 20 mg by mouth as needed for fluid. 09/19/14   [provider]  GLUMETZA 500 MG 24 hr tablet Take 500 mg by mouth 3 (three) times daily. 06/01/20   [provider]  lubiprostone (AMITIZA) 24 MCG capsule Take 1 capsule (24 mcg total) by mouth 2 (two) times daily. 02/23/21   Daryel November, MD  magnesium hydroxide (MILK OF MAGNESIA) 800 MG/5ML suspension Take 30 mLs by mouth daily.    [provider]  meclizine (ANTIVERT) 25 MG tablet Take 25 mg by mouth daily as needed for dizziness. 08/18/20   [provider]  MOVANTIK 12.5 MG TABS tablet Take 12.5 mg by mouth every morning. 12/31/20   [provider]  nitroGLYCERIN (NITROSTAT) 0.4 MG SL tablet Place 0.4 mg under the tongue every 5 (five) minutes as needed for chest pain. Patient not taking: Reported on 02/23/2021    [provider]  oxyCODONE (OXY IR/ROXICODONE) 5 MG immediate release tablet Take 5 mg by mouth every 8 (eight) hours as needed. 12/01/20   [provider]  pantoprazole (PROTONIX) 40 MG tablet Take 1 tablet (40 mg total) by mouth 2 (two) times daily. 03/02/21   Daryel November, MD  polyethylene glycol (MIRALAX) 17 g packet Take 17 g by mouth 2 (two) times daily as needed. 08/22/20   Gifford Shave, MD  promethazine (PHENERGAN) 25 MG tablet Take 25 mg by mouth every 8 (eight) hours as needed for nausea or vomiting. 05/19/20   [provider]  RABEprazole Sodium (ACIPHEX PO) Take 20 mg by mouth in the morning and at bedtime.    [provider]  traMADol (ULTRAM) 50 MG tablet Take 1-2 tablets (50-100 mg total) by mouth 3 (three) times daily as  needed. Patient taking differently: Take 50-100 mg by mouth 3 (three) times daily as needed for moderate pain. 12/18/17   Mcarthur Rossetti, MD    Allergies    Naratriptan, Penicillins, Iodine, Ciprofloxacin, and Penicillin g  Review of Systems   Review of Systems  Constitutional:  Negative for chills and fever.  HENT:  Negative for sore throat.   Eyes:  Negative for pain, redness and visual disturbance.  Respiratory:  Negative for cough and shortness of breath.   Cardiovascular:  Negative for chest pain and leg swelling.  Gastrointestinal:  Positive for abdominal pain, nausea and vomiting. Negative for constipation and diarrhea.  Genitourinary:  Negative for dysuria and flank pain.  Musculoskeletal:  Negative for back pain, neck pain and neck stiffness.  Skin:  Negative for rash.  Neurological:  Positive for headaches. Negative for syncope.  Hematological:  Does not bruise/bleed easily.  Psychiatric/Behavioral:  Negative for confusion.    Physical Exam Updated Vital Signs BP 139/89 (BP Location: Left Arm)    Pulse (!) 110    Temp 99 F (37.2 C) (Oral)    Resp 16    SpO2 98%   Physical Exam Vitals and nursing note reviewed.  Constitutional:      Appearance: Normal appearance. She is well-developed.  HENT:     Head: Atraumatic.     Comments: No sinus or temporal tenderness.     Nose: Nose normal.     Mouth/Throat:     Mouth: Mucous membranes are moist.     Pharynx: Oropharynx is clear. No oropharyngeal exudate or posterior oropharyngeal erythema.  Eyes:     General: No scleral icterus.    Conjunctiva/sclera: Conjunctivae normal.  Neck:     Trachea: No tracheal deviation.     Comments: No stiffness or rigidity.  Cardiovascular:     Rate and Rhythm: Regular rhythm. Tachycardia present.     Pulses: Normal pulses.     Heart sounds: Normal heart sounds. No murmur heard.   No friction rub. No gallop.  Pulmonary:     Effort: Pulmonary effort is normal. No respiratory  distress.     Breath sounds: Normal breath sounds.  Abdominal:     General: Bowel sounds are normal. There is no distension.     Palpations: Abdomen is soft.     Tenderness: There is abdominal tenderness. There is no guarding.     Comments: Epigastric, mid and left abd tenderness.   Genitourinary:    Comments: No cva tenderness.  Musculoskeletal:        General: No swelling or tenderness.     Cervical back: Normal range of motion and neck supple. No rigidity. No muscular tenderness.     Right lower leg: No edema.     Left lower leg: No edema.  Skin:    General: Skin is warm and dry.     Findings: No rash.  Neurological:     Mental Status: She is alert.     Comments: Alert, speech normal. Motor/sens grossly intact bil.   Psychiatric:        Mood and Affect: Mood normal.    ED Results / Procedures / Treatments   Labs (all labs ordered are listed, but only abnormal results are displayed) Results for orders placed or performed during the hospital encounter of 03/22/21  Resp Panel by RT-PCR (Flu A&B, Covid) Nasopharyngeal Swab   Specimen: Nasopharyngeal Swab; Nasopharyngeal(NP) swabs in vial transport medium  Result Value Ref Range   SARS Coronavirus 2 by RT PCR NEGATIVE NEGATIVE   Influenza A by PCR NEGATIVE NEGATIVE   Influenza B by PCR NEGATIVE NEGATIVE  CBC  Result Value Ref Range   WBC 5.7 4.0 - 10.5 K/uL   RBC 4.01 3.87 - 5.11 MIL/uL   Hemoglobin 11.8 (L) 12.0 - 15.0 g/dL   HCT 36.4 36.0 - 46.0 %   MCV 90.8 80.0 - 100.0 fL   MCH 29.4 26.0 - 34.0 pg   MCHC 32.4 30.0 - 36.0 g/dL   RDW 13.1 11.5 - 15.5 %   Platelets 243 150 - 400 K/uL   nRBC 0.0 0.0 - 0.2 %  Comprehensive metabolic panel  Result Value Ref Range   Sodium 133 (L) 135 - 145 mmol/L   Potassium 4.1 3.5 - 5.1 mmol/L   Chloride 98 98 - 111 mmol/L   CO2 28 22 - 32 mmol/L   Glucose, Bld 134 (H) 70 - 99 mg/dL   BUN 13 8 - 23 mg/dL   Creatinine, Ser 1.14 (H) 0.44 - 1.00 mg/dL   Calcium 10.1 8.9 - 10.3 mg/dL    Total Protein 7.7 6.5 - 8.1 g/dL   Albumin 4.2 3.5 - 5.0 g/dL   AST 16 15 - 41 U/L   ALT 14 0 - 44 U/L   Alkaline Phosphatase 100 38 - 126 U/L   Total Bilirubin 0.7 0.3 - 1.2 mg/dL   GFR, Estimated 51 (L) >60 mL/min   Anion gap 7 5 - 15  Lipase, blood  Result Value Ref Range   Lipase 34 11 - 51 U/L     EKG None  Radiology CT ABDOMEN PELVIS WO CONTRAST  Result Date: 03/22/2021 CLINICAL DATA:  Abdominal pain x3 days. EXAM: CT ABDOMEN AND PELVIS WITHOUT CONTRAST TECHNIQUE: Multidetector CT imaging of the abdomen and pelvis was performed following the standard protocol without IV contrast. COMPARISON:  Abdomen pelvis CT, dated March 15, 2021 and MR abdomen dated March 16, 2021 FINDINGS: Lower chest: No acute abnormality. Hepatobiliary: No focal liver abnormality is seen. Status post cholecystectomy. No biliary dilatation. Pancreas: A 3.6 cm x 3.4 cm x 3.8 cm area of soft tissue attenuation is again seen along the inferior aspect of the junction of the body and tail of the pancreas (axial CT images 26 through 32, CT series 2). Spleen: Normal in size without focal abnormality. Adrenals/Urinary Tract: Adrenal glands are unremarkable. Moderate severity atrophic changes are seen involving the left kidney. The right kidney is normal in size. No focal lesions are identified. Stable marked severity left-sided hydronephrosis is seen with mild, stable right-sided hydroureter. Bladder is unremarkable. Stomach/Bowel: There is moderate to marked severity gastric distension. The appendix is surgically absent. The proximal portion of the duodenum is markedly distended (approximately 5.6 cm in diameter). This is increased in caliber when compared to the prior study (measured 4.0 cm and diameter on the prior exam (axial CT image 43, CT series 2). An abrupt transition zone is seen as the duodenum crosses the midline (axial CT images 34 through 41, CT series 2). Noninflamed diverticula are noted throughout  the large bowel. Vascular/Lymphatic: Aortic atherosclerosis. No enlarged abdominal or pelvic lymph nodes. Reproductive: Uterus and bilateral adnexa are unremarkable. Other: No abdominal wall hernia or abnormality. No abdominopelvic ascites. Musculoskeletal: Approximate 3 mm anterolisthesis of the L5 vertebral body is noted on S1. Moderate severity degenerative changes are also seen at this level. IMPRESSION: 1. Proximal partial small bowel obstruction, secondary to mass effect from a heterogeneous mass involving the duodenum and pancreatic tail. This corresponds to findings seen on the prior MR abdomen. 2. Stable marked severity left-sided hydronephrosis with mild, stable right-sided hydroureter. 3. Colonic diverticulosis. 4. Aortic atherosclerosis. Aortic Atherosclerosis (ICD10-I70.0). Electronically Signed   By: Virgina Norfolk M.D.   On: 03/22/2021 21:11    Procedures Procedures   Medications Ordered in ED Medications  sodium chloride 0.9 % bolus 1,000 mL (has no administration in time range)  HYDROmorphone (DILAUDID) injection 0.5 mg (has no administration in time range)  ondansetron (ZOFRAN) injection 4 mg (has no administration in time range)    ED Course  I have reviewed the triage vital  signs and the nursing notes.  Pertinent labs & imaging results that were available during my care of the patient were reviewed by me and considered in my medical decision making (see chart for details).    MDM Rules/Calculators/A&P                         Iv ns bolus. Dilaudid iv, zofran iv. Labs sent.  Reviewed nursing notes and prior charts for additional history. Sent for prior medical records/imaging studies to be faxed from outside facility.   Labs reviewed/interpreted by me - wbc and hgb normal.   CT reviewed/interpreted by me - proximal sbo, pancreatic mass.  General surgery consulted - discussed pt and ct with Dr Georgette Dover - he agrees w NG, ivf, and indicates admit to medicine. States they  will see in consult tomorrow, and that admitting medical team will also need to consult GI for EUS.   Hospitalists consulted for admission. Discussed pt - they will admit.  Also consulted [facility] who had planned on seeing patient w procedure in 1-2 weeks -  discussed pt with Dr Candis Schatz - he indicates they will see patient tomorrow AM, keep npo, NG, ivf.        Final Clinical Impression(s) / ED Diagnoses Final diagnoses:  None    Rx / DC Orders ED Discharge Orders     None          Lajean Saver, MD 03/22/21 2208

## 2021-03-22 NOTE — Assessment & Plan Note (Signed)
Likely the cause of her bowel obstruction due to compression on the duodenum. Pt with at least 2 months of weight loss. See GI note from 02-23-2021. Likely cancer.

## 2021-03-22 NOTE — Subjective & Objective (Signed)
CC: abd pain, N/V HPI: 75 year old female with a history of diabetes, hypertension, reflux presents to the ER today with abdominal pain, nausea vomiting.  Patient's had weight loss since early part of November.  She was seen by GI on November 30,022.  They started a work-up for her weight loss.  She was seen at [facility] according to the husband last week.  She had a scan which showed a pancreatic mass.  She was discharged to home.  It appears that she was scheduled for an EUS by [facility] on March 29, 2021.  Patient's had increasing pain, nausea, vomiting since last night.  Patient brought to the ER.  CT of the abdomen pelvis here showed a small bowel obstruction with distended duodenum secondary to pancreatic mass.  Pancreatic mass measures 3.6 x 3.4 x 3.8.  EDP is discussed the case with surgery.  NG tube was recommended.  Surgery will see the patient in the morning.  [facility] hospitalist contacted for admission.

## 2021-03-22 NOTE — Assessment & Plan Note (Signed)
Hold HTN meds due to NG tube. Place her on clonidine patch.

## 2021-03-22 NOTE — ED Notes (Signed)
Transport called for pt to go upstairs

## 2021-03-22 NOTE — Assessment & Plan Note (Signed)
IV protonix  

## 2021-03-22 NOTE — Assessment & Plan Note (Signed)
Admit to inpatient med/surg bed. NG tube to [facility]. General surgery and GI have been consulted by EDP. Prn IV dilaudid.

## 2021-03-22 NOTE — ED Triage Notes (Signed)
Patient here from home reporting "all over" abd pain x3 days with n/v. Mass on pancreas. Procedures scheduled in Jan.

## 2021-03-22 NOTE — Telephone Encounter (Signed)
EUS Entero has been scheduled for 03/29/21 at 730 am at [facility] with [facility]  Instructions have been sent to the My Chart and home address.    I have spoken to the pt's husband and we discussed the appt information.  He will call back if he has any questions after reviewing the written information.

## 2021-03-22 NOTE — H&P (Signed)
History and Physical    Leah Pruitt PRF:[id number] DOB: 05-19-1945 DOA: 03/22/2021  PCP: System, Provider Not In   Patient coming from: Home  I have personally briefly reviewed patient's old medical records in [place]  CC: abd pain, N/V HPI: 75 year old female with a history of diabetes, hypertension, reflux presents to the ER today with abdominal pain, nausea vomiting.  Patient's had weight loss since early part of November.  She was seen by GI on November 30,022.  They started a work-up for her weight loss.  She was seen at [facility] according to the husband last week.  She had a scan which showed a pancreatic mass.  She was discharged to home.  It appears that she was scheduled for an EUS by [facility] on March 29, 2021.  Patient's had increasing pain, nausea, vomiting since last night.  Patient brought to the ER.  CT of the abdomen pelvis here showed a small bowel obstruction with distended duodenum secondary to pancreatic mass.  Pancreatic mass measures 3.6 x 3.4 x 3.8.  EDP is discussed the case with surgery.  NG tube was recommended.  Surgery will see the patient in the morning.  [facility] hospitalist contacted for admission.   ED Course: CT abd shows SBO due to pancreatic mass. GI and general surgery consulted. NG tube ordered.  Review of Systems:  Review of Systems  Constitutional:  Positive for weight loss.       Weight loss since November 2022  HENT: Negative.    Eyes: Negative.   Respiratory: Negative.    Cardiovascular: Negative.   Gastrointestinal:  Positive for abdominal pain, nausea and vomiting.  Genitourinary: Negative.   Musculoskeletal:  Positive for back pain.  Skin: Negative.   Neurological: Negative.   Endo/Heme/Allergies: Negative.   Psychiatric/Behavioral: Negative.    All other systems reviewed and are negative.  Past Medical History:  Diagnosis Date   Appendicitis, acute 11/19/2012   Arthritis    Back pain    Cardiac  arrhythmia    Colles' fracture of right radius, init for clos fx 07/09/2017   Diabetes ([place])    Diverticulosis    FH: cholecystectomy    Gastroesophageal reflux disease    GERD (gastroesophageal reflux disease) 11/19/2012   Headache    HTN (hypertension)    Lipoma    Scalp   Migraines    Osteoporosis    Pain in right wrist 07/09/2017   Unspecified essential hypertension 11/19/2012    Past Surgical History:  Procedure Laterality Date   CESAREAN SECTION     CHOLECYSTECTOMY     LAPAROSCOPIC APPENDECTOMY N/A 11/16/2012   Procedure: APPENDECTOMY LAPAROSCOPIC;  Surgeon: Imogene Burn. Georgette Dover, MD;  Location: [place];  Service: General;  Laterality: N/A;   LAPAROSCOPIC LYSIS OF ADHESIONS Bilateral 11/16/2012   Procedure: LAPAROSCOPIC LYSIS OF ADHESIONS;  Surgeon: Imogene Burn. Georgette Dover, MD;  Location: [place];  Service: General;  Laterality: Bilateral;   TONSILLECTOMY       reports that she has never smoked. She has never used smokeless tobacco. She reports that she does not drink alcohol and does not use drugs.  Allergies  Allergen Reactions   Naratriptan Anaphylaxis    Rash, difficult time breathing and swallowing   Penicillins Other (See Comments)    Numbness of mouth and dry mouth   Iodine Swelling   Ciprofloxacin     Other reaction(s): Other (See Comments) Tendon pain   Penicillin G Rash    Family History  Problem Relation  Age of Onset   Stroke Mother 36       deceased   Hypertension Mother    Diabetes Mellitus I Mother    Lung cancer Father 72       deceased   Hypertension Sister    Uterine cancer Sister    Stomach cancer Brother     Prior to Admission medications   Medication Sig Start Date End Date Taking? Authorizing Provider  BENICAR 20 MG tablet Take 20 mg by mouth 2 (two) times daily. 11/24/20  Yes [provider]  Calcium Carbonate-Vitamin D 600-200 MG-UNIT TABS Take 1 tablet by mouth 2 (two) times daily with a meal.   Yes [provider]   Cholecalciferol 25 MCG (1000 UT) tablet Take 2,000 Units by mouth daily.   Yes [provider]  dicyclomine (BENTYL) 20 MG tablet Take 20 mg by mouth daily as needed for spasms. 03/12/21  Yes [provider]  diltiazem (TIAZAC) 120 MG 24 hr capsule Do not take till you talk with your Medical doctor about her blood pressure Patient taking differently: Take 120 mg by mouth daily. 11/17/12  Yes Earnstine Regal, PA-C  furosemide (LASIX) 20 MG tablet Take 20 mg by mouth daily. 09/19/14  Yes [provider]  GLUMETZA 500 MG 24 hr tablet Take 500 mg by mouth 3 (three) times daily. 06/01/20  Yes [provider]  magnesium hydroxide (MILK OF MAGNESIA) 800 MG/5ML suspension Take 30 mLs by mouth daily.   Yes [provider]  meclizine (ANTIVERT) 25 MG tablet Take 25 mg by mouth daily as needed for dizziness. 08/18/20  Yes [provider]  MOVANTIK 12.5 MG TABS tablet Take 12.5 mg by mouth daily as needed (constipation). 12/31/20  Yes [provider]  nitrofurantoin, macrocrystal-monohydrate, (MACROBID) 100 MG capsule Take 100 mg by mouth daily. continuous   Yes [provider]  nitroGLYCERIN (NITROSTAT) 0.4 MG SL tablet Place 0.4 mg under the tongue every 5 (five) minutes as needed for chest pain.   Yes [provider]  oxyCODONE (OXY IR/ROXICODONE) 5 MG immediate release tablet Take 5 mg by mouth every 8 (eight) hours as needed for severe pain. 12/01/20  Yes [provider]  pantoprazole (PROTONIX) 40 MG tablet Take 1 tablet (40 mg total) by mouth 2 (two) times daily. 03/02/21  Yes Daryel November, MD  RABEprazole (ACIPHEX) 20 MG tablet Take 20 mg by mouth 2 (two) times daily.   Yes [provider]  sucralfate (CARAFATE) 1 GM/10ML suspension Take 1 g by mouth 2 (two) times daily.   Yes [provider]  traMADol (ULTRAM) 50 MG tablet Take 1-2 tablets (50-100 mg total) by mouth 3 (three) times daily as  needed. Patient taking differently: Take 50-100 mg by mouth 3 (three) times daily as needed for moderate pain. 12/18/17  Yes Mcarthur Rossetti, MD  lubiprostone (AMITIZA) 24 MCG capsule Take 1 capsule (24 mcg total) by mouth 2 (two) times daily. Patient not taking: Reported on 03/22/2021 02/23/21   Daryel November, MD  polyethylene glycol (MIRALAX) 17 g packet Take 17 g by mouth 2 (two) times daily as needed. Patient not taking: Reported on 03/22/2021 08/22/20   Gifford Shave, MD    Physical Exam: Vitals:   03/22/21 2015 03/22/21 2030 03/22/21 2105 03/22/21 2145  BP: 131/76  131/76 132/75  Pulse: 94 92 92 (!) 102  Resp: 14 15 15 19   Temp:      TempSrc:  SpO2: 100% 98% 99% 100%    Physical Exam Vitals and nursing note reviewed.  Constitutional:      General: She is not in acute distress.    Appearance: Normal appearance. She is not ill-appearing, toxic-appearing or diaphoretic.  HENT:     Head: Normocephalic and atraumatic.     Nose: Nose normal.  Eyes:     General:        Right eye: No discharge.        Left eye: No discharge.  Cardiovascular:     Rate and Rhythm: Normal rate and regular rhythm.     Pulses: Normal pulses.  Pulmonary:     Effort: Pulmonary effort is normal. No respiratory distress.     Breath sounds: No wheezing.  Abdominal:     General: Bowel sounds are increased. There is distension.     Tenderness: There is abdominal tenderness in the left lower quadrant.  Musculoskeletal:     Right lower leg: No edema.     Left lower leg: No edema.  Skin:    General: Skin is warm and dry.     Capillary Refill: Capillary refill takes less than 2 seconds.  Neurological:     General: No focal deficit present.     Mental Status: She is alert and oriented to person, place, and time.     Labs on Admission: I have personally reviewed following labs and imaging studies  CBC: Recent Labs  Lab 03/22/21 1851  WBC 5.7  HGB 11.8*  HCT 36.4  MCV 90.8   PLT 258   Basic Metabolic Panel: Recent Labs  Lab 03/22/21 1851  NA 133*  K 4.1  CL 98  CO2 28  GLUCOSE 134*  BUN 13  CREATININE 1.14*  CALCIUM 10.1   GFR: CrCl cannot be calculated (Unknown ideal weight.). Liver Function Tests: Recent Labs  Lab 03/22/21 1851  AST 16  ALT 14  ALKPHOS 100  BILITOT 0.7  PROT 7.7  ALBUMIN 4.2   Recent Labs  Lab 03/22/21 1851  LIPASE 34   No results for input(s): AMMONIA in the last 168 hours. Coagulation Profile: No results for input(s): INR, PROTIME in the last 168 hours. Cardiac Enzymes: No results for input(s): CKTOTAL, CKMB, CKMBINDEX, TROPONINI in the last 168 hours. BNP (last 3 results) No results for input(s): PROBNP in the last 8760 hours. HbA1C: No results for input(s): HGBA1C in the last 72 hours. CBG: No results for input(s): GLUCAP in the last 168 hours. Lipid Profile: No results for input(s): CHOL, HDL, LDLCALC, TRIG, CHOLHDL, LDLDIRECT in the last 72 hours. Thyroid Function Tests: No results for input(s): TSH, T4TOTAL, FREET4, T3FREE, THYROIDAB in the last 72 hours. Anemia Panel: No results for input(s): VITAMINB12, FOLATE, FERRITIN, TIBC, IRON, RETICCTPCT in the last 72 hours. Urine analysis:    Component Value Date/Time   COLORURINE YELLOW 08/20/2020 [place] 08/20/2020 1535   LABSPEC 1.010 08/20/2020 1535   PHURINE 8.0 08/20/2020 1535   GLUCOSEU NEGATIVE 08/20/2020 1535   HGBUR SMALL (A) 08/20/2020 1535   BILIRUBINUR NEGATIVE 08/20/2020 1535   KETONESUR NEGATIVE 08/20/2020 1535   PROTEINUR NEGATIVE 08/20/2020 1535   UROBILINOGEN 0.2 11/15/2012 2157   NITRITE NEGATIVE 08/20/2020 1535   LEUKOCYTESUR NEGATIVE 08/20/2020 1535    Radiological Exams on Admission: I have personally reviewed images CT ABDOMEN PELVIS WO CONTRAST  Result Date: 03/22/2021 CLINICAL DATA:  Abdominal pain x3 days. EXAM: CT ABDOMEN AND PELVIS WITHOUT CONTRAST TECHNIQUE: Multidetector CT imaging  of the abdomen  and pelvis was performed following the standard protocol without IV contrast. COMPARISON:  Abdomen pelvis CT, dated March 15, 2021 and MR abdomen dated March 16, 2021 FINDINGS: Lower chest: No acute abnormality. Hepatobiliary: No focal liver abnormality is seen. Status post cholecystectomy. No biliary dilatation. Pancreas: A 3.6 cm x 3.4 cm x 3.8 cm area of soft tissue attenuation is again seen along the inferior aspect of the junction of the body and tail of the pancreas (axial CT images 26 through 32, CT series 2). Spleen: Normal in size without focal abnormality. Adrenals/Urinary Tract: Adrenal glands are unremarkable. Moderate severity atrophic changes are seen involving the left kidney. The right kidney is normal in size. No focal lesions are identified. Stable marked severity left-sided hydronephrosis is seen with mild, stable right-sided hydroureter. Bladder is unremarkable. Stomach/Bowel: There is moderate to marked severity gastric distension. The appendix is surgically absent. The proximal portion of the duodenum is markedly distended (approximately 5.6 cm in diameter). This is increased in caliber when compared to the prior study (measured 4.0 cm and diameter on the prior exam (axial CT image 43, CT series 2). An abrupt transition zone is seen as the duodenum crosses the midline (axial CT images 34 through 41, CT series 2). Noninflamed diverticula are noted throughout the large bowel. Vascular/Lymphatic: Aortic atherosclerosis. No enlarged abdominal or pelvic lymph nodes. Reproductive: Uterus and bilateral adnexa are unremarkable. Other: No abdominal wall hernia or abnormality. No abdominopelvic ascites. Musculoskeletal: Approximate 3 mm anterolisthesis of the L5 vertebral body is noted on S1. Moderate severity degenerative changes are also seen at this level. IMPRESSION: 1. Proximal partial small bowel obstruction, secondary to mass effect from a heterogeneous mass involving the duodenum and  pancreatic tail. This corresponds to findings seen on the prior MR abdomen. 2. Stable marked severity left-sided hydronephrosis with mild, stable right-sided hydroureter. 3. Colonic diverticulosis. 4. Aortic atherosclerosis. Aortic Atherosclerosis (ICD10-I70.0). Electronically Signed   By: Virgina Norfolk M.D.   On: 03/22/2021 21:11    EKG: I have personally reviewed EKG: NSR   Assessment/Plan Principal Problem:   Small bowel obstruction (HCC) Active Problems:   Pancreatic mass   Essential hypertension   GERD (gastroesophageal reflux disease)   Type 2 diabetes mellitus without complication ([place])    Small bowel obstruction (HCC) Admit to inpatient med/surg bed. NG tube to LWS. General surgery and GI have been consulted by EDP. Prn IV dilaudid.  Pancreatic mass Likely the cause of her bowel obstruction due to compression on the duodenum. Pt with at least 2 months of weight loss. See GI note from 02-23-2021. Likely cancer.  Essential hypertension Hold HTN meds due to NG tube. Place her on clonidine patch.  GERD (gastroesophageal reflux disease) IV protonix.  Type 2 diabetes mellitus without complication (HCC) Add SSI q6h.  DVT prophylaxis: SQ Heparin Code Status: Full Code Family Communication: discussed with pt and husband at bedside  Disposition Plan: return home  Consults called: GI([place]) and general surgery  Admission status: Inpatient, Med-Surg   Kristopher Oppenheim, DO [facility] 03/22/2021, 10:17 PM

## 2021-03-22 NOTE — Telephone Encounter (Signed)
-----   Message from Irving Copas., MD sent at 03/22/2021  1:02 PM EST ----- Regarding: RE: New pt with pancreatic mass SEC, I have reviewed the imaging on canopy.  This should be amenable to reaching with EUS.  Stavroula Rohde, please offer this patient enteroscopy/EUS on 1/3 7:30 AM for attempt at sampling via EUS. If this does not work with her, then she will need next available EUS with DJ or myself.  We will not do her colonoscopy at the same time as I do not have time for that. Please update everyone once the patient has decided if she can take that slot.  Thanks. GM ----- Message ----- From: Daryel November, MD Sent: 03/22/2021  12:51 PM EST To: Milus Banister, MD, Lin Givens, RN, # Subject: RE: New pt with pancreatic mass                Ms. O'Kelley,  I am not trained in EUS, so I have included our two EUS providers, Drs. Mansouraty and Ardis Hughs for their input and availability.   I was not able to see the CT that was performed at [facility].  Are we able to get that scanned in?  Thanks,  Dr. Candis Schatz   ----- Message ----- From: Lin Givens, RN Sent: 03/18/2021  11:01 AM EST To: Ladell Pier, MD, Daryel November, MD Subject: New pt with pancreatic mass                    Dr Candis Schatz, We received referral from [employer] ER physician for pt with pancreatic mass, she is scheduled with you for EGD/Colonoscopy on 04/11/21.  We are seeing her on 1/6 as new pt and wanted to request if you would be able to do an EUS for biopsy since it appears the mass involves pancreatic tail and distal duodenum?  And if it possible to do it sooner?  Thank you, Royston Sinner RN [place]

## 2021-03-23 ENCOUNTER — Other Ambulatory Visit: Payer: Self-pay

## 2021-03-23 ENCOUNTER — Telehealth: Payer: Self-pay | Admitting: *Deleted

## 2021-03-23 DIAGNOSIS — K8689 Other specified diseases of pancreas: Principal | ICD-10-CM

## 2021-03-23 DIAGNOSIS — K311 Adult hypertrophic pyloric stenosis: Secondary | ICD-10-CM

## 2021-03-23 DIAGNOSIS — K56609 Unspecified intestinal obstruction, unspecified as to partial versus complete obstruction: Secondary | ICD-10-CM | POA: Diagnosis not present

## 2021-03-23 DIAGNOSIS — R112 Nausea with vomiting, unspecified: Secondary | ICD-10-CM

## 2021-03-23 LAB — COMPREHENSIVE METABOLIC PANEL
ALT: 12 U/L (ref 0–44)
AST: 12 U/L — ABNORMAL LOW (ref 15–41)
Albumin: 3.5 g/dL (ref 3.5–5.0)
Alkaline Phosphatase: 77 U/L (ref 38–126)
Anion gap: 5 (ref 5–15)
BUN: 9 mg/dL (ref 8–23)
CO2: 21 mmol/L — ABNORMAL LOW (ref 22–32)
Calcium: 9 mg/dL (ref 8.9–10.3)
Chloride: 107 mmol/L (ref 98–111)
Creatinine, Ser: 0.92 mg/dL (ref 0.44–1.00)
GFR, Estimated: >60 mL/min (ref >60)
Glucose, Bld: 129 mg/dL — ABNORMAL HIGH (ref 70–99)
Potassium: 3.8 mmol/L (ref 3.5–5.1)
Sodium: 133 mmol/L — ABNORMAL LOW (ref 135–145)
Total Bilirubin: 0.5 mg/dL (ref 0.3–1.2)
Total Protein: 6 g/dL — ABNORMAL LOW (ref 6.5–8.1)

## 2021-03-23 LAB — GLUCOSE, CAPILLARY
Glucose-Capillary: 134 mg/dL — ABNORMAL HIGH (ref 70–99)
Glucose-Capillary: 112 mg/dL — ABNORMAL HIGH (ref 70–99)
Glucose-Capillary: 91 mg/dL (ref 70–99)
Glucose-Capillary: 75 mg/dL (ref 70–99)

## 2021-03-23 LAB — CBC WITH DIFFERENTIAL/PLATELET
Abs Immature Granulocytes: 0.03 10*3/uL (ref 0.00–0.07)
Basophils Absolute: 0 10*3/uL (ref 0.0–0.1)
Basophils Relative: 1 %
Eosinophils Absolute: 0.1 10*3/uL (ref 0.0–0.5)
Eosinophils Relative: 1 %
HCT: 32 % — ABNORMAL LOW (ref 36.0–46.0)
Hemoglobin: 10.5 g/dL — ABNORMAL LOW (ref 12.0–15.0)
Immature Granulocytes: 1 %
Lymphocytes Relative: 16 %
Lymphs Abs: 1 10*3/uL (ref 0.7–4.0)
MCH: 29.3 pg (ref 26.0–34.0)
MCHC: 32.8 g/dL (ref 30.0–36.0)
MCV: 89.4 fL (ref 80.0–100.0)
Monocytes Absolute: 0.6 10*3/uL (ref 0.1–1.0)
Monocytes Relative: 11 %
Neutro Abs: 4.1 10*3/uL (ref 1.7–7.7)
Neutrophils Relative %: 70 %
Platelets: 210 10*3/uL (ref 150–400)
RBC: 3.58 MIL/uL — ABNORMAL LOW (ref 3.87–5.11)
RDW: 13.1 % (ref 11.5–15.5)
WBC: 5.8 10*3/uL (ref 4.0–10.5)
nRBC: 0 % (ref 0.0–0.2)

## 2021-03-23 LAB — URINALYSIS, ROUTINE W REFLEX MICROSCOPIC
Bilirubin Urine: NEGATIVE
Glucose, UA: NEGATIVE mg/dL
Hgb urine dipstick: NEGATIVE
Ketones, ur: NEGATIVE mg/dL
Leukocytes,Ua: NEGATIVE
Nitrite: NEGATIVE
Protein, ur: NEGATIVE mg/dL
Specific Gravity, Urine: 1.005 (ref 1.005–1.030)
pH: 9 — ABNORMAL HIGH (ref 5.0–8.0)

## 2021-03-23 LAB — MAGNESIUM: Magnesium: 1.8 mg/dL (ref 1.7–2.4)

## 2021-03-23 LAB — HEMOGLOBIN A1C
Hgb A1c MFr Bld: 6.4 % — ABNORMAL HIGH (ref 4.8–5.6)
Mean Plasma Glucose: 136.98 mg/dL

## 2021-03-23 LAB — LIPASE, BLOOD: Lipase: 31 U/L (ref 11–51)

## 2021-03-23 MED ORDER — ACETAMINOPHEN 10 MG/ML IV SOLN
1000.0000 mg | Freq: Four times a day (QID) | INTRAVENOUS | Status: AC | PRN
  Administered 2021-03-23 – 2021-03-24 (×2): 1000 mg via INTRAVENOUS
  Filled 2021-03-23 (×2): qty 100

## 2021-03-23 MED ORDER — PHENOL 1.4 % MT LIQD
1.0000 | OROMUCOSAL | Status: DC | PRN
  Administered 2021-03-23 – 2021-03-24 (×2): 1 via OROMUCOSAL
  Filled 2021-03-23 (×2): qty 177

## 2021-03-23 MED ORDER — LORAZEPAM 2 MG/ML IJ SOLN
1.0000 mg | Freq: Four times a day (QID) | INTRAMUSCULAR | Status: DC | PRN
  Administered 2021-03-23: 22:00:00 1 mg via INTRAVENOUS
  Filled 2021-03-23: qty 1

## 2021-03-23 MED ORDER — HYDROMORPHONE HCL 1 MG/ML IJ SOLN
1.0000 mg | INTRAMUSCULAR | Status: DC | PRN
  Administered 2021-03-23 – 2021-03-24 (×2): 1 mg via INTRAVENOUS
  Filled 2021-03-23 (×2): qty 1

## 2021-03-23 NOTE — Progress Notes (Signed)
Patient and her husband demanding that NG tube be removed. RN explained to both the patient and her husband that NG tube could not be removed without an order. RN also explained to the patient and her husband that patient would start to vomit if ng tube is removed and be in more pain. Patient and husband asking RN to page MD. MD Karleen Hampshire paged and requesting daughters number. Number provided to MD.

## 2021-03-23 NOTE — Progress Notes (Signed)
°  Transition of Care (TOC) Screening Note   Patient Details  Name: Leah Pruitt Date of Birth: 10-May-1945   Transition of Care [facility]) CM/SW Contact:    Dessa Phi, RN Phone Number: 03/23/2021, 10:25 AM    Transition of Care Department [facility]) has reviewed patient and no TOC needs have been identified at this time. We will continue to monitor patient advancement through interdisciplinary progression rounds. If new patient transition needs arise, please place a TOC consult.

## 2021-03-23 NOTE — Consult Note (Addendum)
Referring Provider: [facility] PCP: System, Provider Not In  Gastroenterologist: Dustin Flock, MD Reason for consultation:  SBO , pancreatic / duodenal mass on imaging           Attending physician's note   I have taken a history, examined the patient and reviewed the chart. I agree with the Advanced Practitioner's note, impression and recommendations.  Patient is Spanish-speaking, refused language interpreter.  Her husband and her family was trying to translate convey the information She does not want to proceed with EGD, she does not want to do it if there is no guarantee that it will be diagnostic to obtain biopsy. She is willing to undergo EUS but she does not want to stay in the hospital or have the NG tube Patient was informed that there is risk for aspiration given she has gastric outlet obstruction without the NG tube and EUS may not be possible if she has increased gastric fluid or contents when she comes in for the procedure next week.  Consider IR consult for possible CT-guided pancreas biopsy  Continue NG tube N.p.o.  Patient will benefit from goals of care discussion with palliative care, it is not clear how much she is comprehending and what she would prefer in terms of further diagnostic or therapeutic interventions or if she would like to explore hospice  The patient was provided an opportunity to ask questions and all were answered. The patient agreed with the plan and demonstrated an understanding of the instructions.  Leah Pruitt , MD [phone]    ASSESSMENT / PLAN   #   75 year old Spanish-speaking female with partial SBO secondary to mass involving pancreatic tail and duodenum.  Needs EUS with FNA. She is schedule for this as an outpatient on 03/29/20 but hospitalized now with nausea / vomiting related to the partial small bowel obstruction.   --Large amount of NGT output. No significant abdominal distention on exam.  --Right now our advanced  biliary endoscopists do not have any availability to perform EUS any sooner than the 03/29/21 date she is already scheduled for. If this is pancreatic mass which has eroded into duodenum then enteroscopy with duodenal biopsies may be helpful. Dr. Silverio Decamp will review CTAP and decide on that.  --Patient had multiple concerns. She was confused and concerned about how so many different providers knew about her case. She needed to connect all the dots between providers and their roles. Her confusion led to some slight agitation. She declined Spanish interpreter. Husband and wife, both fluent in [place] assisted with interview. The risks / benefits of EUS with FNA were discussed with the patient and her family. Patient did agree to proceed.  --Continue NGT decompression --She received dose of SQ heparin 5000 units at 5:30 am.  Will hold if / when we decided to proceed with procedures.    # Leah Pruitt anemia, new. Hgb stable at 10.5 Ferritin 34 so may not be iron deficient. Recent positive Cologuard. Was scheduled for colonoscopy which is now on hold given change in clinical course  # GERD --Continue IV PPI  HISTORY OF PRESENT ILLNESS  Chief Complaint:  pancreatic mass, small bowel obstruction   Leah Pruitt is a 75 y.o. female with a past medical history significant for diabetes, diverticulosis, chronic constipation , GERD, osteoporosis, migraines, hypertension , cholecystectomy, appendectomy , C-section see [facility] for any additional medical problems.   **Spanish speaking. Didn't want interpreter. Husband and daughter serving as Optometrist / interpretor are thanks   ED course:   Patient was evaluated in our office 02/23/21 for LLQ pain ( ? Chronic to some degree), bloating, vomiting and weight loss. She also has chronic constipation.  She had been hospitalized in May of this year with LLQ  pain and AKI with hydronephrosis and mild anemia.  CT scan on admission showed no evidence of diverticulitis, it was thought that she may have passed a kidney stone. At the time of our office visit with the patient she was scheduled for an upper endoscopy.  She was hesitant to undergo a colonoscopy but did agree to a Cologuard which returned positive.  She was subsequently scheduled for an EGD and colonoscopy to evaluate abdominal pain, nausea, weight loss, new anemia and a positive Cologuard . In the interim, for constipation we changed her to Amitiza.  We changed her PPI from Aciphex to Protonix.  EGD and colonoscopy scheduled for mid January 2023  In the interim patient has been found on imaging done at [facility]  to have a pancreatic mass.  She was referred to Oncology ( upcoming appointment) who reviewed case and contacted our office about doing an EUS with biopsy.  She is scheduled to have this done 03/29/2021 with Dr. Rush Landmark.  In the interim, patient presented to the ED yesterday with abdominal pain, nausea and vomiting.    ED Evaluation T 99, HR 110, BP 139/89  Noncontrast CT shows proximal partial small bowel obstruction secondary to a mass-effect from a mass involving the duodenum and pancreatic tail corresponding to findings on prior MR, stable, marked severe left-sided hydronephrosis with mild stable right-sided hydroureter.   Creatine 0.92, lipase 31, liver chemistries normal.  WBC 5.8, hemoglobin 11.3 (baseline 10.5-11.5) platelets 210  Patient complains of constant LLQ pain. Nausea / vomiting resolved with NGT decompression. She isn't taking Amitiza. When it was prescribed the family forgot that she had previous trial Amitiza and last cramping.  She spent in [place].   Patient is very confused about how so many people are involved in her care.  She does not understand how GI knew she is in the hospital right now.  She wants to know how all of her providers have  communicated about her issues [facility], Oncology, Dr. Candis Schatz, Dr. Rush Landmark).     IMAGING:  CT ABDOMEN PELVIS WO CONTRAST  Result Date: 03/22/2021 CLINICAL DATA:  Abdominal pain x3 days. EXAM: CT ABDOMEN AND PELVIS WITHOUT CONTRAST TECHNIQUE: Multidetector CT imaging of the abdomen and pelvis was performed following the standard protocol without IV contrast. COMPARISON:  Abdomen pelvis CT, dated March 15, 2021 and MR abdomen dated March 16, 2021 FINDINGS: Lower chest: No acute abnormality. Hepatobiliary: No focal liver abnormality is seen. Status post cholecystectomy. No biliary dilatation. Pancreas: A 3.6 cm x 3.4 cm x 3.8 cm area of soft tissue attenuation is again seen along the inferior aspect of the junction of the body and tail of the pancreas (axial CT images 26 through 32, CT series 2). Spleen: Normal in size without focal abnormality. Adrenals/Urinary Tract: Adrenal glands are unremarkable. Moderate severity atrophic changes are seen involving the  left kidney. The right kidney is normal in size. No focal lesions are identified. Stable marked severity left-sided hydronephrosis is seen with mild, stable right-sided hydroureter. Bladder is unremarkable. Stomach/Bowel: There is moderate to marked severity gastric distension. The appendix is surgically absent. The proximal portion of the duodenum is markedly distended (approximately 5.6 cm in diameter). This is increased in caliber when compared to the prior study (measured 4.0 cm and diameter on the prior exam (axial CT image 43, CT series 2). An abrupt transition zone is seen as the duodenum crosses the midline (axial CT images 34 through 41, CT series 2). Noninflamed diverticula are noted throughout the large bowel. Vascular/Lymphatic: Aortic atherosclerosis. No enlarged abdominal or pelvic lymph nodes. Reproductive: Uterus and bilateral adnexa are unremarkable. Other: No abdominal wall hernia or abnormality. No abdominopelvic  ascites. Musculoskeletal: Approximate 3 mm anterolisthesis of the L5 vertebral body is noted on S1. Moderate severity degenerative changes are also seen at this level. IMPRESSION: 1. Proximal partial small bowel obstruction, secondary to mass effect from a heterogeneous mass involving the duodenum and pancreatic tail. This corresponds to findings seen on the prior MR abdomen. 2. Stable marked severity left-sided hydronephrosis with mild, stable right-sided hydroureter. 3. Colonic diverticulosis. 4. Aortic atherosclerosis. Aortic Atherosclerosis (ICD10-I70.0). Electronically Signed   By: Virgina Norfolk M.D.   On: 03/22/2021 21:11   DG Abd Portable 1 View  Result Date: 03/22/2021 CLINICAL DATA:  NG tube EXAM: PORTABLE ABDOMEN - 1 VIEW COMPARISON:  CT 03/22/2021 FINDINGS: Esophageal tube tip overlies the stomach. Suspect side-port is in the region of GE junction. Gas collection in central abdomen probably represents dilated stomach. IMPRESSION: 1. Esophageal tube tip overlies proximal stomach, suspect that the side port is in the region of GE junction, suggest further advancement for more optimal positioning. Electronically Signed   By: Donavan Foil M.D.   On: 03/22/2021 22:42    Past Medical History:  Diagnosis Date   Appendicitis, acute 11/19/2012   Arthritis    Back pain    Cardiac arrhythmia    Colles' fracture of right radius, init for clos fx 07/09/2017   Diabetes ([place])    Diverticulosis    FH: cholecystectomy    Gastroesophageal reflux disease    GERD (gastroesophageal reflux disease) 11/19/2012   Headache    HTN (hypertension)    Lipoma    Scalp   Migraines    Osteoporosis    Pain in right wrist 07/09/2017   Unspecified essential hypertension 11/19/2012    Past Surgical History:  Procedure Laterality Date   CESAREAN SECTION     CHOLECYSTECTOMY     LAPAROSCOPIC APPENDECTOMY N/A 11/16/2012   Procedure: APPENDECTOMY LAPAROSCOPIC;  Surgeon: Imogene Burn. Georgette Dover, MD;  Location: [facility];   Service: General;  Laterality: N/A;   LAPAROSCOPIC LYSIS OF ADHESIONS Bilateral 11/16/2012   Procedure: LAPAROSCOPIC LYSIS OF ADHESIONS;  Surgeon: Imogene Burn. Georgette Dover, MD;  Location: [facility];  Service: General;  Laterality: Bilateral;   TONSILLECTOMY      Prior to Admission medications   Medication Sig Start Date End Date Taking? Authorizing Provider  BENICAR 20 MG tablet Take 20 mg by mouth 2 (two) times daily. 11/24/20  Yes [provider]  Calcium Carbonate-Vitamin D 600-200 MG-UNIT TABS Take 1 tablet by mouth 2 (two) times daily with a meal.   Yes [provider]  Cholecalciferol 25 MCG (1000 UT) tablet Take 2,000 Units by mouth daily.   Yes [provider]  dicyclomine (BENTYL) 20 MG tablet Take  20 mg by mouth daily as needed for spasms. 03/12/21  Yes [provider]  diltiazem (TIAZAC) 120 MG 24 hr capsule Do not take till you talk with your Medical doctor about her blood pressure Patient taking differently: Take 120 mg by mouth daily. 11/17/12  Yes Earnstine Regal, PA-C  furosemide (LASIX) 20 MG tablet Take 20 mg by mouth daily. 09/19/14  Yes [provider]  GLUMETZA 500 MG 24 hr tablet Take 500 mg by mouth 3 (three) times daily. 06/01/20  Yes [provider]  magnesium hydroxide (MILK OF MAGNESIA) 800 MG/5ML suspension Take 30 mLs by mouth daily.   Yes [provider]  meclizine (ANTIVERT) 25 MG tablet Take 25 mg by mouth daily as needed for dizziness. 08/18/20  Yes [provider]  MOVANTIK 12.5 MG TABS tablet Take 12.5 mg by mouth daily as needed (constipation). 12/31/20  Yes [provider]  nitrofurantoin, macrocrystal-monohydrate, (MACROBID) 100 MG capsule Take 100 mg by mouth daily. continuous   Yes [provider]  nitroGLYCERIN (NITROSTAT) 0.4 MG SL tablet Place 0.4 mg under the tongue every 5 (five) minutes as needed for chest pain.   Yes [provider]  oxyCODONE (OXY IR/ROXICODONE) 5 MG  immediate release tablet Take 5 mg by mouth every 8 (eight) hours as needed for severe pain. 12/01/20  Yes [provider]  pantoprazole (PROTONIX) 40 MG tablet Take 1 tablet (40 mg total) by mouth 2 (two) times daily. 03/02/21  Yes Daryel November, MD  RABEprazole (ACIPHEX) 20 MG tablet Take 20 mg by mouth 2 (two) times daily.   Yes [provider]  sucralfate (CARAFATE) 1 GM/10ML suspension Take 1 g by mouth 2 (two) times daily.   Yes [provider]  traMADol (ULTRAM) 50 MG tablet Take 1-2 tablets (50-100 mg total) by mouth 3 (three) times daily as needed. Patient taking differently: Take 50-100 mg by mouth 3 (three) times daily as needed for moderate pain. 12/18/17  Yes Mcarthur Rossetti, MD  lubiprostone (AMITIZA) 24 MCG capsule Take 1 capsule (24 mcg total) by mouth 2 (two) times daily. Patient not taking: Reported on 03/22/2021 02/23/21   Daryel November, MD  polyethylene glycol (MIRALAX) 17 g packet Take 17 g by mouth 2 (two) times daily as needed. Patient not taking: Reported on 03/22/2021 08/22/20   Gifford Shave, MD    Current Facility-Administered Medications  Medication Dose Route Frequency Provider Last Rate Last Admin   cloNIDine (CATAPRES - Dosed in mg/24 hr) patch 0.1 mg  0.1 mg Transdermal Weekly Kristopher Oppenheim, DO   0.1 mg at 03/23/21 0025   heparin injection 5,000 Units  5,000 Units Subcutaneous Q8H Kristopher Oppenheim, DO   5,000 Units at 03/23/21 9211   HYDROmorphone (DILAUDID) injection 0.5 mg  0.5 mg Intravenous Q2H PRN Kristopher Oppenheim, DO   0.5 mg at 03/23/21 0804   insulin aspart (novoLOG) injection 0-9 Units  0-9 Units Subcutaneous Q6H Kristopher Oppenheim, DO   1 Units at 03/23/21 9417   lactated ringers infusion   Intravenous Continuous Kristopher Oppenheim, DO 75 mL/hr at 03/23/21 0002 New Bag at 03/23/21 0002   ondansetron (ZOFRAN) injection 4 mg  4 mg Intravenous Q6H PRN Kristopher Oppenheim, DO       pantoprazole (PROTONIX) injection 40 mg  40 mg Intravenous Q12H Kristopher Oppenheim, DO   40 mg at 03/23/21 0804   phenol (CHLORASEPTIC) mouth spray 1 spray  1 spray Mouth/Throat PRN Kristopher Oppenheim, DO   1  spray at 03/23/21 0133    Allergies as of 03/22/2021 - Review Complete 03/22/2021  Allergen Reaction Noted   Naratriptan Anaphylaxis 06/26/2015   Penicillins Other (See Comments) 11/15/2012   Iodine Swelling 11/15/2012   Ciprofloxacin  06/28/2019   Penicillin g Rash 06/26/2015    Family History  Problem Relation Age of Onset   Stroke Mother 68       deceased   Hypertension Mother    Diabetes Mellitus I Mother    Lung cancer Father 51       deceased   Hypertension Sister    Uterine cancer Sister    Stomach cancer Brother     Social History   Socioeconomic History   Marital status: Married    Spouse name: Not on file   Number of children: 2   Years of education: Some HS   Highest education level: Not on file  Occupational History   Occupation: housewife  Tobacco Use   Smoking status: Never   Smokeless tobacco: Never  Vaping Use   Vaping Use: Never used  Substance and Sexual Activity   Alcohol use: No   Drug use: No   Sexual activity: Not on file  Other Topics Concern   Not on file  Social History Narrative   Lives at home with husband.   Right-handed.   Occasional use of caffeine.   Social Determinants of Health   Financial Resource Strain: Not on file  Food Insecurity: Not on file  Transportation Needs: Not on file  Physical Activity: Not on file  Stress: Not on file  Social Connections: Not on file  Intimate Partner Violence: Not on file    Review of Systems: All systems reviewed and negative except where noted in HPI.   OBJECTIVE    Physical Exam: Vital signs in last 24 hours: Temp:  [97.5 F (36.4 C)-99 F (37.2 C)] 98.6 F (37 C) (12/28 0758) Pulse Rate:  [92-114] 96 (12/28 0758) Resp:  [11-19] 16 (12/28 0758) BP: (114-141)/(75-94) 114/78 (12/28 0758) SpO2:  [97 %-100 %] 97 % (12/28 0758) Last BM Date:  03/22/21  General:  Alert female in NAD Psych:  Slightly agitated.  Eyes: Pupils equal, no icterus. Conjunctive pink Ears:  Normal auditory acuity Nose: No deformity, discharge or lesions Neck:  Supple, no masses felt Lungs:  Clear to auscultation.  Heart:  Regular rate, regular rhythm. No lower extremity edema Abdomen:  Soft, nondistended, generalized diffuse lower abdominal tenderness.  Rectal :  Deferred Msk: Symmetrical without gross deformities.  Neurologic:  Alert, oriented, grossly normal neurologically Skin:  Intact without significant lesions.    Scheduled inpatient medications  cloNIDine  0.1 mg Transdermal Weekly   heparin  5,000 Units Subcutaneous Q8H   insulin aspart  0-9 Units Subcutaneous Q6H   pantoprazole (PROTONIX) IV  40 mg Intravenous Q12H      Intake/Output from previous day: 12/27 0701 - 12/28 0700 In: 447.5 [I.V.:447.5] Out: 600 [Emesis/NG output:600] Intake/Output this shift: No intake/output data recorded.   Lab Results: Recent Labs    03/22/21 1851 03/23/21 0420  WBC 5.7 5.8  HGB 11.8* 10.5*  HCT 36.4 32.0*  PLT 243 210   BMET Recent Labs    03/22/21 1851 03/23/21 0420  NA 133* 133*  K 4.1 3.8  CL 98 107  CO2 28 21*  GLUCOSE 134* 129*  BUN 13 9  CREATININE 1.14* 0.92  CALCIUM 10.1 9.0   LFTs Recent Labs    03/23/21 0420  PROT  6.0*  ALBUMIN 3.5  AST 12*  ALT 12  ALKPHOS 77  BILITOT 0.5   PT/INR No results for input(s): LABPROT, INR in the last 72 hours. Hepatitis Panel No results for input(s): HEPBSAG, HCVAB, HEPAIGM, HEPBIGM in the last 72 hours.   . CBC Latest Ref Rng & Units 03/23/2021 03/22/2021 02/23/2021  WBC 4.0 - 10.5 K/uL 5.8 5.7 5.1  Hemoglobin 12.0 - 15.0 g/dL 10.5(L) 11.8(L) 11.3(L)  Hematocrit 36.0 - 46.0 % 32.0(L) 36.4 34.6(L)  Platelets 150 - 400 K/uL 210 243 215.0    . CMP Latest Ref Rng & Units 03/23/2021 03/22/2021 02/23/2021  Glucose 70 - 99 mg/dL 129(H) 134(H) 90  BUN 8 - 23 mg/dL 9 13  19   Creatinine 0.44 - 1.00 mg/dL 0.92 1.14(H) 1.19  Sodium 135 - 145 mmol/L 133(L) 133(L) 136  Potassium 3.5 - 5.1 mmol/L 3.8 4.1 4.0  Chloride 98 - 111 mmol/L 107 98 101  CO2 22 - 32 mmol/L 21(L) 28 30  Calcium 8.9 - 10.3 mg/dL 9.0 10.1 9.7  Total Protein 6.5 - 8.1 g/dL 6.0(L) 7.7 7.0  Total Bilirubin 0.3 - 1.2 mg/dL 0.5 0.7 0.3  Alkaline Phos 38 - 126 U/L 77 100 122(H)  AST 15 - 41 U/L 12(L) 16 15  ALT 0 - 44 U/L 12 14 15      Principal Problem:   Small bowel obstruction ([facility]) Active Problems:   Essential hypertension   GERD (gastroesophageal reflux disease)   Pancreatic mass   Type 2 diabetes mellitus without complication ([place])    Tye Savoy, NP-C @  03/23/2021, 9:34 AM

## 2021-03-23 NOTE — Telephone Encounter (Signed)
Dr Rush Landmark please see note from the pt's daughter.  Do you want me to go ahead and cancel?

## 2021-03-23 NOTE — Plan of Care (Signed)
°  Problem: Pain Managment: Goal: General experience of comfort will improve Outcome: Progressing   Problem: Elimination: Goal: Will not experience complications related to bowel motility Outcome: Progressing   Problem: Coping: Goal: Level of anxiety will decrease Outcome: Progressing   Problem: Activity: Goal: Risk for activity intolerance will decrease Outcome: Progressing

## 2021-03-23 NOTE — Consult Note (Signed)
Consult Note  Leah Pruitt 03/23/1946  [id number].    Requesting MD: Kristopher Oppenheim Chief Complaint/Reason for Consult: pancreatic mass  HPI:  Leah Pruitt is a 75yo female PMH HTN, GERD, T2DM, and prior h/o cardiac arrhythmia currently in NSR who was admitted to [facility] yesterday complaining of worsening abdominal pain, nausea, and vomiting. Patient reports a 2 year history of intermittent left sided abdominal pain that she was told was from diverticulosis, as well as at least a 10lb weight loss since early November. She saw Dr. Candis Schatz of [facility] last month who had her scheduled for a colonoscopy in January. Due to worsening symptoms she was seen at [facility] last week on 03/18/21 and underwent CT scan and MRI that showed a possible pancreatic mass involving the pancreatic tail and distal duodenum. She was scheduled for EUS with Dr. Rush Landmark on 03/29/21. Over the last 3 days she reports worsening left sided abdominal pain, nausea, and vomiting. Unable to tolerate any PO. She did have a BM yesterday and is passing some flatus. She came to the ED where CT scan yesterday showed proximal partial small bowel obstruction secondary to mass effect from a heterogeneous mass involving the duodenum and pancreatic tail. NG tube was placed and she was admitted to the medical service. General surgery asked to see.  Abdominal surgeries: appendectomy, cholecystectomy, c section x2 Blood thinning medications: none Nonsmoker Denies alcohol or illicit drug use Ambulates without assistive device  ROS: Review of Systems  Constitutional:  Positive for weight loss.  Respiratory: Negative.    Cardiovascular: Negative.   Gastrointestinal:  Positive for abdominal pain, constipation, nausea and vomiting.  Neurological:  Positive for headaches.   Family History  Problem Relation Age of Onset   Stroke Mother 2       deceased   Hypertension Mother    Diabetes Mellitus I Mother    Lung cancer  Father 39       deceased   Hypertension Sister    Uterine cancer Sister    Stomach cancer Brother     Past Medical History:  Diagnosis Date   Appendicitis, acute 11/19/2012   Arthritis    Back pain    Cardiac arrhythmia    Colles' fracture of right radius, init for clos fx 07/09/2017   Diabetes ([place])    Diverticulosis    FH: cholecystectomy    Gastroesophageal reflux disease    GERD (gastroesophageal reflux disease) 11/19/2012   Headache    HTN (hypertension)    Lipoma    Scalp   Migraines    Osteoporosis    Pain in right wrist 07/09/2017   Unspecified essential hypertension 11/19/2012    Past Surgical History:  Procedure Laterality Date   CESAREAN SECTION     CHOLECYSTECTOMY     LAPAROSCOPIC APPENDECTOMY N/A 11/16/2012   Procedure: APPENDECTOMY LAPAROSCOPIC;  Surgeon: Imogene Burn. Georgette Dover, MD;  Location: [place];  Service: General;  Laterality: N/A;   LAPAROSCOPIC LYSIS OF ADHESIONS Bilateral 11/16/2012   Procedure: LAPAROSCOPIC LYSIS OF ADHESIONS;  Surgeon: Imogene Burn. Georgette Dover, MD;  Location: [place];  Service: General;  Laterality: Bilateral;   TONSILLECTOMY      Social History:  reports that she has never smoked. She has never used smokeless tobacco. She reports that she does not drink alcohol and does not use drugs.  Allergies:  Allergies  Allergen Reactions   Naratriptan Anaphylaxis    Rash, difficult time breathing and swallowing   Penicillins Other (See Comments)  Numbness of mouth and dry mouth   Iodine Swelling   Ciprofloxacin     Other reaction(s): Other (See Comments) Tendon pain   Penicillin G Rash    Medications Prior to Admission  Medication Sig Dispense Refill   BENICAR 20 MG tablet Take 20 mg by mouth 2 (two) times daily.     Calcium Carbonate-Vitamin D 600-200 MG-UNIT TABS Take 1 tablet by mouth 2 (two) times daily with a meal.     Cholecalciferol 25 MCG (1000 UT) tablet Take 2,000 Units by mouth daily.     dicyclomine (BENTYL) 20 MG tablet Take 20 mg  by mouth daily as needed for spasms.     diltiazem (TIAZAC) 120 MG 24 hr capsule Do not take till you talk with your Medical doctor about her blood pressure (Patient taking differently: Take 120 mg by mouth daily.)     furosemide (LASIX) 20 MG tablet Take 20 mg by mouth daily.  4   GLUMETZA 500 MG 24 hr tablet Take 500 mg by mouth 3 (three) times daily.     magnesium hydroxide (MILK OF MAGNESIA) 800 MG/5ML suspension Take 30 mLs by mouth daily.     meclizine (ANTIVERT) 25 MG tablet Take 25 mg by mouth daily as needed for dizziness.     MOVANTIK 12.5 MG TABS tablet Take 12.5 mg by mouth daily as needed (constipation).     nitrofurantoin, macrocrystal-monohydrate, (MACROBID) 100 MG capsule Take 100 mg by mouth daily. continuous     nitroGLYCERIN (NITROSTAT) 0.4 MG SL tablet Place 0.4 mg under the tongue every 5 (five) minutes as needed for chest pain.     oxyCODONE (OXY IR/ROXICODONE) 5 MG immediate release tablet Take 5 mg by mouth every 8 (eight) hours as needed for severe pain.     pantoprazole (PROTONIX) 40 MG tablet Take 1 tablet (40 mg total) by mouth 2 (two) times daily. 120 tablet 1   RABEprazole (ACIPHEX) 20 MG tablet Take 20 mg by mouth 2 (two) times daily.     sucralfate (CARAFATE) 1 GM/10ML suspension Take 1 g by mouth 2 (two) times daily.     traMADol (ULTRAM) 50 MG tablet Take 1-2 tablets (50-100 mg total) by mouth 3 (three) times daily as needed. (Patient taking differently: Take 50-100 mg by mouth 3 (three) times daily as needed for moderate pain.) 50 tablet 0   lubiprostone (AMITIZA) 24 MCG capsule Take 1 capsule (24 mcg total) by mouth 2 (two) times daily. (Patient not taking: Reported on 03/22/2021) 60 capsule 3   polyethylene glycol (MIRALAX) 17 g packet Take 17 g by mouth 2 (two) times daily as needed. (Patient not taking: Reported on 03/22/2021) 14 each 0    Blood pressure 114/78, pulse 96, temperature 98.6 F (37 C), temperature source Axillary, resp. rate 16, SpO2 97  %. Physical Exam:  General: pleasant, WD, female who is laying in bed in NAD HEENT: head is normocephalic, atraumatic.  Sclera are noninjected.  Pupils equal and round.  Ears and nose without any masses or lesions.  Mouth is pink and moist. NG tube present with thick green fluid in canister  Heart: regular, rate, and rhythm.  Normal s1,s2. No obvious murmurs, gallops, or rubs noted.  Palpable radial and pedal pulses bilaterally Lungs: CTAB, no wheezes, rhonchi, or rales noted.  Respiratory effort nonlabored Abd: soft, minimal distension, few BS heard, no masses, hernias, or organomegaly. Mild left sided abdominal TTP without rebound or guarding MS: all 4 extremities are symmetrical with  no cyanosis, clubbing, or edema. Skin: warm and dry with no masses, lesions, or rashes Neuro: Cranial nerves 2-12 grossly intact, sensation is normal throughout Psych: A&Ox3 with an appropriate affect.   Results for orders placed or performed during the hospital encounter of 03/22/21 (from the past 48 hour(s))  CBC     Status: Abnormal   Collection Time: 03/22/21  6:51 PM  Result Value Ref Range   WBC 5.7 4.0 - 10.5 K/uL   RBC 4.01 3.87 - 5.11 MIL/uL   Hemoglobin 11.8 (L) 12.0 - 15.0 g/dL   HCT 36.4 36.0 - 46.0 %   MCV 90.8 80.0 - 100.0 fL   MCH 29.4 26.0 - 34.0 pg   MCHC 32.4 30.0 - 36.0 g/dL   RDW 13.1 11.5 - 15.5 %   Platelets 243 150 - 400 K/uL   nRBC 0.0 0.0 - 0.2 %    Comment: Performed at [facility], [place] [address], [place], [place] [address]  Comprehensive metabolic panel     Status: Abnormal   Collection Time: 03/22/21  6:51 PM  Result Value Ref Range   Sodium 133 (L) 135 - 145 mmol/L   Potassium 4.1 3.5 - 5.1 mmol/L   Chloride 98 98 - 111 mmol/L   CO2 28 22 - 32 mmol/L   Glucose, Bld 134 (H) 70 - 99 mg/dL    Comment: Glucose reference range applies only to samples taken after fasting for at least 8 hours.   BUN 13 8 - 23 mg/dL   Creatinine, Ser 1.14 (H) 0.44 - 1.00  mg/dL   Calcium 10.1 8.9 - 10.3 mg/dL   Total Protein 7.7 6.5 - 8.1 g/dL   Albumin 4.2 3.5 - 5.0 g/dL   AST 16 15 - 41 U/L   ALT 14 0 - 44 U/L   Alkaline Phosphatase 100 38 - 126 U/L   Total Bilirubin 0.7 0.3 - 1.2 mg/dL   GFR, Estimated 51 (L) >60 mL/min    Comment: (NOTE) Calculated using the CKD-EPI Creatinine Equation (2021)    Anion gap 7 5 - 15    Comment: Performed at [facility], [place] [address] [place], [place] [address]  Lipase, blood     Status: None   Collection Time: 03/22/21  6:51 PM  Result Value Ref Range   Lipase 34 11 - 51 U/L    Comment: Performed at [facility], [place] [address], [place], [place] [address]  Urinalysis, Routine w reflex microscopic     Status: Abnormal   Collection Time: 03/22/21  6:51 PM  Result Value Ref Range   Color, Urine COLORLESS (A) YELLOW   APPearance CLEAR CLEAR   Specific Gravity, Urine 1.005 1.005 - 1.030   pH 9.0 (H) 5.0 - 8.0   Glucose, UA NEGATIVE NEGATIVE mg/dL   Hgb urine dipstick NEGATIVE NEGATIVE   Bilirubin Urine NEGATIVE NEGATIVE   Ketones, ur NEGATIVE NEGATIVE mg/dL   Protein, ur NEGATIVE NEGATIVE mg/dL   Nitrite NEGATIVE NEGATIVE   Leukocytes,Ua NEGATIVE NEGATIVE    Comment: Performed at [place] [address], [place],  [address]  Resp Panel by RT-PCR (Flu A&B, Covid) Nasopharyngeal Swab     Status: None   Collection Time: 03/22/21  6:51 PM   Specimen: Nasopharyngeal Swab; Nasopharyngeal(NP) swabs in vial transport medium  Result Value Ref Range   SARS Coronavirus 2 by RT PCR NEGATIVE NEGATIVE    Comment: (NOTE) SARS-CoV-2 target nucleic acids are  NOT DETECTED.  The SARS-CoV-2 RNA is generally detectable in upper respiratory specimens during the acute phase of infection. The lowest concentration of SARS-CoV-2 viral copies this assay can detect is 138 copies/mL. A negative result does not preclude SARS-Cov-2 infection and should not be used as  the sole basis for treatment or other patient management decisions. A negative result may occur with  improper specimen collection/handling, submission of specimen other than nasopharyngeal swab, presence of viral mutation(s) within the areas targeted by this assay, and inadequate number of viral copies(<138 copies/mL). A negative result must be combined with clinical observations, patient history, and epidemiological information. The expected result is Negative.  Fact Sheet for Patients:  EntrepreneurPulse.com.au  Fact Sheet for Healthcare Providers:  IncredibleEmployment.be  This test is no t yet approved or cleared by the [place] FDA and  has been authorized for detection and/or diagnosis of SARS-CoV-2 by FDA under an Emergency Use Authorization (EUA). This EUA will remain  in effect (meaning this test can be used) for the duration of the COVID-19 declaration under Section 564(b)(1) of the Act, 21 U.S.C.section 360bbb-3(b)(1), unless the authorization is terminated  or revoked sooner.       Influenza A by PCR NEGATIVE NEGATIVE   Influenza B by PCR NEGATIVE NEGATIVE    Comment: (NOTE) The Xpert Xpress SARS-CoV-2/FLU/RSV plus assay is intended as an aid in the diagnosis of influenza from Nasopharyngeal swab specimens and should not be used as a sole basis for treatment. Nasal washings and aspirates are unacceptable for Xpert Xpress SARS-CoV-2/FLU/RSV testing.  Fact Sheet for Patients: EntrepreneurPulse.com.au  Fact Sheet for Healthcare Providers: IncredibleEmployment.be  This test is not yet approved or cleared by the [place] FDA and has been authorized for detection and/or diagnosis of SARS-CoV-2 by FDA under an Emergency Use Authorization (EUA). This EUA will remain in effect (meaning this test can be used) for the duration of the COVID-19 declaration under Section 564(b)(1) of the Act, 21  U.S.C. section 360bbb-3(b)(1), unless the authorization is terminated or revoked.  Performed at [facility], [place] [address], [place], [place] [address]   Glucose, capillary     Status: Abnormal   Collection Time: 03/22/21 11:39 PM  Result Value Ref Range   Glucose-Capillary 121 (H) 70 - 99 mg/dL    Comment: Glucose reference range applies only to samples taken after fasting for at least 8 hours.  CBC with Differential/Platelet     Status: Abnormal   Collection Time: 03/23/21  4:20 AM  Result Value Ref Range   WBC 5.8 4.0 - 10.5 K/uL   RBC 3.58 (L) 3.87 - 5.11 MIL/uL   Hemoglobin 10.5 (L) 12.0 - 15.0 g/dL   HCT 32.0 (L) 36.0 - 46.0 %   MCV 89.4 80.0 - 100.0 fL   MCH 29.3 26.0 - 34.0 pg   MCHC 32.8 30.0 - 36.0 g/dL   RDW 13.1 11.5 - 15.5 %   Platelets 210 150 - 400 K/uL   nRBC 0.0 0.0 - 0.2 %   Neutrophils Relative % 70 %   Neutro Abs 4.1 1.7 - 7.7 K/uL   Lymphocytes Relative 16 %   Lymphs Abs 1.0 0.7 - 4.0 K/uL   Monocytes Relative 11 %   Monocytes Absolute 0.6 0.1 - 1.0 K/uL   Eosinophils Relative 1 %   Eosinophils Absolute 0.1 0.0 - 0.5 K/uL   Basophils Relative 1 %   Basophils Absolute 0.0 0.0 - 0.1 K/uL   Immature Granulocytes 1 %  Abs Immature Granulocytes 0.03 0.00 - 0.07 K/uL    Comment: Performed at [facility], [place] [address], [place], [place] [address]  Comprehensive metabolic panel     Status: Abnormal   Collection Time: 03/23/21  4:20 AM  Result Value Ref Range   Sodium 133 (L) 135 - 145 mmol/L   Potassium 3.8 3.5 - 5.1 mmol/L   Chloride 107 98 - 111 mmol/L   CO2 21 (L) 22 - 32 mmol/L   Glucose, Bld 129 (H) 70 - 99 mg/dL    Comment: Glucose reference range applies only to samples taken after fasting for at least 8 hours.   BUN 9 8 - 23 mg/dL   Creatinine, Ser 0.92 0.44 - 1.00 mg/dL   Calcium 9.0 8.9 - 10.3 mg/dL   Total Protein 6.0 (L) 6.5 - 8.1 g/dL   Albumin 3.5 3.5 - 5.0 g/dL   AST 12 (L) 15 - 41 U/L   ALT 12  0 - 44 U/L   Alkaline Phosphatase 77 38 - 126 U/L   Total Bilirubin 0.5 0.3 - 1.2 mg/dL   GFR, Estimated >60 >60 mL/min    Comment: (NOTE) Calculated using the CKD-EPI Creatinine Equation (2021)    Anion gap 5 5 - 15    Comment: Performed at [facility], [place] [address], [place], [place] [address]  Magnesium     Status: None   Collection Time: 03/23/21  4:20 AM  Result Value Ref Range   Magnesium 1.8 1.7 - 2.4 mg/dL    Comment: Performed at [facility], [place] [address] [place], [place] [address]  Lipase, blood     Status: None   Collection Time: 03/23/21  4:20 AM  Result Value Ref Range   Lipase 31 11 - 51 U/L    Comment: Performed at [facility], [place] [address], [place], [place] [address]  Hemoglobin A1c     Status: Abnormal   Collection Time: 03/23/21  4:20 AM  Result Value Ref Range   Hgb A1c MFr Bld 6.4 (H) 4.8 - 5.6 %    Comment: (NOTE) Pre diabetes:          5.7%-6.4%  Diabetes:              >6.4%  Glycemic control for   <7.0% adults with diabetes    Mean Plasma Glucose 136.98 mg/dL    Comment: Performed at [place] [address], [place], [place] [address]  Glucose, capillary     Status: Abnormal   Collection Time: 03/23/21  5:09 AM  Result Value Ref Range   Glucose-Capillary 134 (H) 70 - 99 mg/dL    Comment: Glucose reference range applies only to samples taken after fasting for at least 8 hours.   CT ABDOMEN PELVIS WO CONTRAST  Result Date: 03/22/2021 CLINICAL DATA:  Abdominal pain x3 days. EXAM: CT ABDOMEN AND PELVIS WITHOUT CONTRAST TECHNIQUE: Multidetector CT imaging of the abdomen and pelvis was performed following the standard protocol without IV contrast. COMPARISON:  Abdomen pelvis CT, dated March 15, 2021 and MR abdomen dated March 16, 2021 FINDINGS: Lower chest: No acute abnormality. Hepatobiliary: No focal liver abnormality is seen. Status post cholecystectomy. No biliary  dilatation. Pancreas: A 3.6 cm x 3.4 cm x 3.8 cm area of soft tissue attenuation is again seen along the inferior aspect of the junction of the body and tail of the pancreas (axial CT images 26 through 32, CT series  2). Spleen: Normal in size without focal abnormality. Adrenals/Urinary Tract: Adrenal glands are unremarkable. Moderate severity atrophic changes are seen involving the left kidney. The right kidney is normal in size. No focal lesions are identified. Stable marked severity left-sided hydronephrosis is seen with mild, stable right-sided hydroureter. Bladder is unremarkable. Stomach/Bowel: There is moderate to marked severity gastric distension. The appendix is surgically absent. The proximal portion of the duodenum is markedly distended (approximately 5.6 cm in diameter). This is increased in caliber when compared to the prior study (measured 4.0 cm and diameter on the prior exam (axial CT image 43, CT series 2). An abrupt transition zone is seen as the duodenum crosses the midline (axial CT images 34 through 41, CT series 2). Noninflamed diverticula are noted throughout the large bowel. Vascular/Lymphatic: Aortic atherosclerosis. No enlarged abdominal or pelvic lymph nodes. Reproductive: Uterus and bilateral adnexa are unremarkable. Other: No abdominal wall hernia or abnormality. No abdominopelvic ascites. Musculoskeletal: Approximate 3 mm anterolisthesis of the L5 vertebral body is noted on S1. Moderate severity degenerative changes are also seen at this level. IMPRESSION: 1. Proximal partial small bowel obstruction, secondary to mass effect from a heterogeneous mass involving the duodenum and pancreatic tail. This corresponds to findings seen on the prior MR abdomen. 2. Stable marked severity left-sided hydronephrosis with mild, stable right-sided hydroureter. 3. Colonic diverticulosis. 4. Aortic atherosclerosis. Aortic Atherosclerosis (ICD10-I70.0). Electronically Signed   By: Virgina Norfolk M.D.    On: 03/22/2021 21:11   DG Abd Portable 1 View  Result Date: 03/22/2021 CLINICAL DATA:  NG tube EXAM: PORTABLE ABDOMEN - 1 VIEW COMPARISON:  CT 03/22/2021 FINDINGS: Esophageal tube tip overlies the stomach. Suspect side-port is in the region of GE junction. Gas collection in central abdomen probably represents dilated stomach. IMPRESSION: 1. Esophageal tube tip overlies proximal stomach, suspect that the side port is in the region of GE junction, suggest further advancement for more optimal positioning. Electronically Signed   By: Donavan Foil M.D.   On: 03/22/2021 22:42    Anti-infectives (From admission, onward)    None         Assessment/Plan Proximal SBO secondary to pancreatic mass - CT 12/27 with proximal psbo secondary to mass effect from heterogeneous mass involving duodenum and pancreatic tail. She did have an MRI on 03/18/21 but I am unable to see these images - NGT was placed - 600 cc recorded out - Patient was being worked up by GI as an outpatient and was scheduled for EUS 03/29/21 - Recommend having GI see today for consideration of EUS sooner - Continue further work up per primary and GI. We will follow.  FEN: NPO, NGT to LIWS, IVF. Check prealbumin VTE: sq heparin ID: none Foley: none  Prior h/o cardiac arrhythmia - on diltiazem  HTN GERD T2DM Osteopenia Migraines   Margie Billet, [facility] 03/23/2021, 9:43 AM Please see Amion for pager number during day hours 7:00am-4:30pm

## 2021-03-23 NOTE — Progress Notes (Signed)
PROGRESS NOTE    Leah Pruitt  WYO:[id number] DOB: Apr 17, 1945 DOA: 03/22/2021 PCP: System, Provider Not In    Chief Complaint  Patient presents with   Abdominal Pain   Nausea   Emesis    Brief Narrative:  75 year old female with a history of diabetes, hypertension, reflux presents to the ER today with abdominal pain, nausea vomiting.  Patient's had weight loss since early part of November.  She was seen by GI on November 30,022.  They started a work-up for her weight loss.  She was seen at [facility] according to the husband last week.  She had a scan which showed a pancreatic mass.  She was discharged to home.  It appears that she was scheduled for an EUS by [facility] on March 29, 2021.   Patient's had increasing pain, nausea, vomiting since last night.  Patient brought to the ER.   CT of the abdomen pelvis here showed a small bowel obstruction with distended duodenum secondary to pancreatic mass.  Pancreatic mass measures 3.6 x 3.4 x 3.8.   EDP is discussed the case with surgery.  NG tube was placed.  GI/Gen Surgery consulted.   Assessment & Plan:   Principal Problem:   Small bowel obstruction (HCC) Active Problems:   Essential hypertension   GERD (gastroesophageal reflux disease)   Pancreatic mass   Type 2 diabetes mellitus without complication (HCC)  Small bowel obstruction secondary to pancreatic mass: -NGT placed, will need further work with EUS, for tissue biopsy vs enteroscopy with duodenal biopsies vs CT guided pancreas biopsy.  - ordered palliative care for goals of care.  - continue with IV fluids and IV anti emetics and IV protonix.    Hypertension; Well controlled.    GERD: ON ppi.    Type 2 DM: CBG (last 3)  Recent Labs    03/22/21 2339 03/23/21 0509 03/23/21 1153  GLUCAP 121* 134* 112*   Resume SSI.  Well controlled.     DVT prophylaxis: Heparin.  Code Status: full code.  Family Communication: family at bedside.   Disposition:   Status is: Inpatient  Remains inpatient appropriate because: of SBO.        Consultants:  Gastroenterology General surgery.    Procedures: NG tube placement.   Antimicrobials: none.   Subjective: Abd pain has improved.  Nausea is persistent.   Objective: Vitals:   03/23/21 0117 03/23/21 0508 03/23/21 0758 03/23/21 1346  BP: (!) 141/80 129/83 114/78 (!) 123/92  Pulse: 99 99 96 (!) 103  Resp: 16 16 16 18   Temp: 98.1 F (36.7 C) 97.9 F (36.6 C) 98.6 F (37 C) (!) 97.5 F (36.4 C)  TempSrc:  Oral Axillary Oral  SpO2: 98% 97% 97% 97%    Intake/Output Summary (Last 24 hours) at 03/23/2021 1638 Last data filed at 03/23/2021 1400 Gross per 24 hour  Intake 1047.5 ml  Output 800 ml  Net 247.5 ml   There were no vitals filed for this visit.  Examination:  General exam: Elderly woman, not in distress.  Respiratory system: Clear to auscultation. Respiratory effort normal. Cardiovascular system: S1 & S2 heard, RRR. No JVD, No pedal edema. Gastrointestinal system: Abdomen is soft, mildly tender, bowel sounds minimal.  Central nervous system: Alert , oriented to place and person.  Extremities: Symmetric 5 x 5 power. Skin: No rashes, lesions or ulcers Psychiatry:  Mood & affect appropriate.     Data Reviewed: I have personally reviewed following labs and imaging studies  CBC:  Recent Labs  Lab 03/22/21 1851 03/23/21 0420  WBC 5.7 5.8  NEUTROABS  --  4.1  HGB 11.8* 10.5*  HCT 36.4 32.0*  MCV 90.8 89.4  PLT 243 062    Basic Metabolic Panel: Recent Labs  Lab 03/22/21 1851 03/23/21 0420  NA 133* 133*  K 4.1 3.8  CL 98 107  CO2 28 21*  GLUCOSE 134* 129*  BUN 13 9  CREATININE 1.14* 0.92  CALCIUM 10.1 9.0  MG  --  1.8    GFR: CrCl cannot be calculated (Unknown ideal weight.).  Liver Function Tests: Recent Labs  Lab 03/22/21 1851 03/23/21 0420  AST 16 12*  ALT 14 12  ALKPHOS 100 77  BILITOT 0.7 0.5  PROT 7.7 6.0*  ALBUMIN  4.2 3.5    CBG: Recent Labs  Lab 03/22/21 2339 03/23/21 0509 03/23/21 1153  GLUCAP 121* 134* 112*     Recent Results (from the past 240 hour(s))  Resp Panel by RT-PCR (Flu A&B, Covid) Nasopharyngeal Swab     Status: None   Collection Time: 03/22/21  6:51 PM   Specimen: Nasopharyngeal Swab; Nasopharyngeal(NP) swabs in vial transport medium  Result Value Ref Range Status   SARS Coronavirus 2 by RT PCR NEGATIVE NEGATIVE Final    Comment: (NOTE) SARS-CoV-2 target nucleic acids are NOT DETECTED.  The SARS-CoV-2 RNA is generally detectable in upper respiratory specimens during the acute phase of infection. The lowest concentration of SARS-CoV-2 viral copies this assay can detect is 138 copies/mL. A negative result does not preclude SARS-Cov-2 infection and should not be used as the sole basis for treatment or other patient management decisions. A negative result may occur with  improper specimen collection/handling, submission of specimen other than nasopharyngeal swab, presence of viral mutation(s) within the areas targeted by this assay, and inadequate number of viral copies(<138 copies/mL). A negative result must be combined with clinical observations, patient history, and epidemiological information. The expected result is Negative.  Fact Sheet for Patients:  EntrepreneurPulse.com.au  Fact Sheet for Healthcare Providers:  IncredibleEmployment.be  This test is no t yet approved or cleared by the [place] FDA and  has been authorized for detection and/or diagnosis of SARS-CoV-2 by FDA under an Emergency Use Authorization (EUA). This EUA will remain  in effect (meaning this test can be used) for the duration of the COVID-19 declaration under Section 564(b)(1) of the Act, 21 U.S.C.section 360bbb-3(b)(1), unless the authorization is terminated  or revoked sooner.       Influenza A by PCR NEGATIVE NEGATIVE Final   Influenza B by PCR  NEGATIVE NEGATIVE Final    Comment: (NOTE) The Xpert Xpress SARS-CoV-2/FLU/RSV plus assay is intended as an aid in the diagnosis of influenza from Nasopharyngeal swab specimens and should not be used as a sole basis for treatment. Nasal washings and aspirates are unacceptable for Xpert Xpress SARS-CoV-2/FLU/RSV testing.  Fact Sheet for Patients: EntrepreneurPulse.com.au  Fact Sheet for Healthcare Providers: IncredibleEmployment.be  This test is not yet approved or cleared by the [place] FDA and has been authorized for detection and/or diagnosis of SARS-CoV-2 by FDA under an Emergency Use Authorization (EUA). This EUA will remain in effect (meaning this test can be used) for the duration of the COVID-19 declaration under Section 564(b)(1) of the Act, 21 U.S.C. section 360bbb-3(b)(1), unless the authorization is terminated or revoked.  Performed at [facility], [place] [address] [place], [place] [address]          Radiology Studies:  CT ABDOMEN PELVIS WO CONTRAST  Result Date: 03/22/2021 CLINICAL DATA:  Abdominal pain x3 days. EXAM: CT ABDOMEN AND PELVIS WITHOUT CONTRAST TECHNIQUE: Multidetector CT imaging of the abdomen and pelvis was performed following the standard protocol without IV contrast. COMPARISON:  Abdomen pelvis CT, dated March 15, 2021 and MR abdomen dated March 16, 2021 FINDINGS: Lower chest: No acute abnormality. Hepatobiliary: No focal liver abnormality is seen. Status post cholecystectomy. No biliary dilatation. Pancreas: A 3.6 cm x 3.4 cm x 3.8 cm area of soft tissue attenuation is again seen along the inferior aspect of the junction of the body and tail of the pancreas (axial CT images 26 through 32, CT series 2). Spleen: Normal in size without focal abnormality. Adrenals/Urinary Tract: Adrenal glands are unremarkable. Moderate severity atrophic changes are seen involving the left kidney. The right  kidney is normal in size. No focal lesions are identified. Stable marked severity left-sided hydronephrosis is seen with mild, stable right-sided hydroureter. Bladder is unremarkable. Stomach/Bowel: There is moderate to marked severity gastric distension. The appendix is surgically absent. The proximal portion of the duodenum is markedly distended (approximately 5.6 cm in diameter). This is increased in caliber when compared to the prior study (measured 4.0 cm and diameter on the prior exam (axial CT image 43, CT series 2). An abrupt transition zone is seen as the duodenum crosses the midline (axial CT images 34 through 41, CT series 2). Noninflamed diverticula are noted throughout the large bowel. Vascular/Lymphatic: Aortic atherosclerosis. No enlarged abdominal or pelvic lymph nodes. Reproductive: Uterus and bilateral adnexa are unremarkable. Other: No abdominal wall hernia or abnormality. No abdominopelvic ascites. Musculoskeletal: Approximate 3 mm anterolisthesis of the L5 vertebral body is noted on S1. Moderate severity degenerative changes are also seen at this level. IMPRESSION: 1. Proximal partial small bowel obstruction, secondary to mass effect from a heterogeneous mass involving the duodenum and pancreatic tail. This corresponds to findings seen on the prior MR abdomen. 2. Stable marked severity left-sided hydronephrosis with mild, stable right-sided hydroureter. 3. Colonic diverticulosis. 4. Aortic atherosclerosis. Aortic Atherosclerosis (ICD10-I70.0). Electronically Signed   By: Virgina Norfolk M.D.   On: 03/22/2021 21:11   DG Abd Portable 1 View  Result Date: 03/22/2021 CLINICAL DATA:  NG tube EXAM: PORTABLE ABDOMEN - 1 VIEW COMPARISON:  CT 03/22/2021 FINDINGS: Esophageal tube tip overlies the stomach. Suspect side-port is in the region of GE junction. Gas collection in central abdomen probably represents dilated stomach. IMPRESSION: 1. Esophageal tube tip overlies proximal stomach, suspect that  the side port is in the region of GE junction, suggest further advancement for more optimal positioning. Electronically Signed   By: Donavan Foil M.D.   On: 03/22/2021 22:42        Scheduled Meds:  cloNIDine  0.1 mg Transdermal Weekly   heparin  5,000 Units Subcutaneous Q8H   insulin aspart  0-9 Units Subcutaneous Q6H   pantoprazole (PROTONIX) IV  40 mg Intravenous Q12H   Continuous Infusions:  acetaminophen     lactated ringers 75 mL/hr at 03/23/21 1212     LOS: 1 day        Hosie Poisson, MD [facility]   To contact the attending provider between 7A-7P or the covering provider during after hours 7P-7A, please log into the web site [email or web address] and access using universal [place] password for that web site. If you do not have the password, please call the hospital operator.  03/23/2021, 4:38 PM

## 2021-03-23 NOTE — Progress Notes (Signed)
Patient a yellow MEWS due to a heart rate of 123. Yellow MEWS protocol implemented.

## 2021-03-23 NOTE — Telephone Encounter (Signed)
Oncology Discharge Planning Admission Note  [facility] Address: [place], [place], [place] [address] Hours of Operation:  8am - 5pm, Monday - Friday  Clinic Contact Information:  [phone]) [phone]  Oncology Care Team: Medical Oncologist:  Dr. Betsy Coder  Contacted  Mr. Leah Pruitt  to inform hime that the oncology provider Dr. Benay Spice is aware of this hospital admission dated 03/22/21, and the cancer center will follow Leah Pruitt inpatient care to assist with discharge planning as indicated by the oncologist.  We will reach out to you closer to discharge date to arrange your follow up care.  Disclaimer:  This [place] note does not imply a formal consult request has been made by the admitting attending for this admission or there will be an inpatient consult completed by oncology.  Please request oncology consults as per standard process as indicated.

## 2021-03-23 NOTE — Telephone Encounter (Signed)
Inbound call from patient daughter requesting to cancel EUS at [facility] as well as EGD/Colon due to wanting to go for a second opinion.

## 2021-03-24 DIAGNOSIS — K311 Adult hypertrophic pyloric stenosis: Secondary | ICD-10-CM | POA: Diagnosis not present

## 2021-03-24 DIAGNOSIS — K56609 Unspecified intestinal obstruction, unspecified as to partial versus complete obstruction: Secondary | ICD-10-CM | POA: Diagnosis not present

## 2021-03-24 DIAGNOSIS — K8689 Other specified diseases of pancreas: Secondary | ICD-10-CM | POA: Diagnosis not present

## 2021-03-24 LAB — GLUCOSE, CAPILLARY
Glucose-Capillary: 58 mg/dL — ABNORMAL LOW (ref 70–99)
Glucose-Capillary: 216 mg/dL — ABNORMAL HIGH (ref 70–99)
Glucose-Capillary: 76 mg/dL (ref 70–99)

## 2021-03-24 LAB — PREALBUMIN: Prealbumin: 16.6 mg/dL — ABNORMAL LOW (ref 18–38)

## 2021-03-24 MED ORDER — DEXTROSE 50 % IV SOLN
25.0000 mL | Freq: Once | INTRAVENOUS | Status: AC
  Administered 2021-03-24: 07:00:00 25 mL via INTRAVENOUS
  Filled 2021-03-24: qty 50

## 2021-03-24 MED ORDER — DIPHENHYDRAMINE HCL 50 MG/ML IJ SOLN: 50.0000 mg | Freq: Once | INTRAMUSCULAR | Status: DC

## 2021-03-24 MED ORDER — DIPHENHYDRAMINE HCL 25 MG PO CAPS: 50.0000 mg | ORAL_CAPSULE | Freq: Once | ORAL | Status: DC

## 2021-03-24 MED ORDER — HYDROCORTISONE SOD SUC (PF) 250 MG IJ SOLR
200.0000 mg | Freq: Once | INTRAMUSCULAR | Status: DC
  Filled 2021-03-24: qty 200

## 2021-03-24 MED ORDER — PREDNISONE 5 MG PO TABS: 50.0000 mg | ORAL_TABLET | Freq: Four times a day (QID) | ORAL | Status: DC

## 2021-03-24 NOTE — Progress Notes (Signed)
This writer was called to the patient's room to discuss POC and patient/family concerns. Patient and family expressed that they wish to leave AMA due to the inability to have her biopsy prior to 03/29/21 and distrust in completing the planned CT with premedication due to [place] of allergy to iodine. We discussed that the current treatment with NGT, pain management, nausea control, and CT scan planned for the AM would be the best plan for her safety and the patient adamantly expressed the need to go home. We discussed in detail reasons why she should stay and receive treatment and this was refused with the statement that "I will feel better at home". We discussed how to seek out treatment should her condition get worse at home and the patient refused additional consults with palliative care as recommended by the physician due to refusal of treatment/scans. Patient's IV line and NGT were removed per request and patient signed the AMA form. Physician and primary RN aware of departure.

## 2021-03-24 NOTE — Progress Notes (Signed)
Rechecked CBG after 50% Dextrose was given,  and CBG now 216.

## 2021-03-24 NOTE — Progress Notes (Signed)
Patient ID: Leah Pruitt, female   DOB: 01-26-1946, 75 y.o.   MRN: [id number] [facility] Surgery Progress Note     Subjective: CC-  Patient still frustrated this morning. She doesn't understand why she hasn't had her biopsy yet. She wants the NG tube out.  Objective: Vital signs in last 24 hours: Temp:  [97.5 F (36.4 C)-98.8 F (37.1 C)] 97.5 F (36.4 C) (12/29 0953) Pulse Rate:  [103-124] 108 (12/29 0953) Resp:  [16-18] 16 (12/29 0953) BP: (121-148)/(81-97) 147/95 (12/29 0953) SpO2:  [96 %-100 %] 99 % (12/29 0953) Last BM Date: 03/22/21  Intake/Output from previous day: 12/28 0701 - 12/29 0700 In: 1900 [I.V.:1800; IV Piggyback:100] Out: 1700 [Emesis/NG output:1700] Intake/Output this shift: No intake/output data recorded.  PE: Gen:  Alert, NAD Pulm:  rate and effort normal on room air Abd: soft, minimal distension, few BS heard, no masses, hernias, or organomegaly. Mild left sided abdominal TTP without rebound or guarding   Lab Results:  Recent Labs    03/22/21 1851 03/23/21 0420  WBC 5.7 5.8  HGB 11.8* 10.5*  HCT 36.4 32.0*  PLT 243 210   BMET Recent Labs    03/22/21 1851 03/23/21 0420  NA 133* 133*  K 4.1 3.8  CL 98 107  CO2 28 21*  GLUCOSE 134* 129*  BUN 13 9  CREATININE 1.14* 0.92  CALCIUM 10.1 9.0   PT/INR No results for input(s): LABPROT, INR in the last 72 hours. CMP     Component Value Date/Time   NA 133 (L) 03/23/2021 0420   K 3.8 03/23/2021 0420   CL 107 03/23/2021 0420   CO2 21 (L) 03/23/2021 0420   GLUCOSE 129 (H) 03/23/2021 0420   BUN 9 03/23/2021 0420   CREATININE 0.92 03/23/2021 0420   CALCIUM 9.0 03/23/2021 0420   PROT 6.0 (L) 03/23/2021 0420   ALBUMIN 3.5 03/23/2021 0420   AST 12 (L) 03/23/2021 0420   ALT 12 03/23/2021 0420   ALKPHOS 77 03/23/2021 0420   BILITOT 0.5 03/23/2021 0420   GFRNONAA >60 03/23/2021 0420   GFRAA >60 10/11/2016 1100   Lipase     Component Value Date/Time   LIPASE 31 03/23/2021 0420        Studies/Results: CT ABDOMEN PELVIS WO CONTRAST  Result Date: 03/22/2021 CLINICAL DATA:  Abdominal pain x3 days. EXAM: CT ABDOMEN AND PELVIS WITHOUT CONTRAST TECHNIQUE: Multidetector CT imaging of the abdomen and pelvis was performed following the standard protocol without IV contrast. COMPARISON:  Abdomen pelvis CT, dated March 15, 2021 and MR abdomen dated March 16, 2021 FINDINGS: Lower chest: No acute abnormality. Hepatobiliary: No focal liver abnormality is seen. Status post cholecystectomy. No biliary dilatation. Pancreas: A 3.6 cm x 3.4 cm x 3.8 cm area of soft tissue attenuation is again seen along the inferior aspect of the junction of the body and tail of the pancreas (axial CT images 26 through 32, CT series 2). Spleen: Normal in size without focal abnormality. Adrenals/Urinary Tract: Adrenal glands are unremarkable. Moderate severity atrophic changes are seen involving the left kidney. The right kidney is normal in size. No focal lesions are identified. Stable marked severity left-sided hydronephrosis is seen with mild, stable right-sided hydroureter. Bladder is unremarkable. Stomach/Bowel: There is moderate to marked severity gastric distension. The appendix is surgically absent. The proximal portion of the duodenum is markedly distended (approximately 5.6 cm in diameter). This is increased in caliber when compared to the prior study (measured 4.0 cm and diameter on the prior  exam (axial CT image 43, CT series 2). An abrupt transition zone is seen as the duodenum crosses the midline (axial CT images 34 through 41, CT series 2). Noninflamed diverticula are noted throughout the large bowel. Vascular/Lymphatic: Aortic atherosclerosis. No enlarged abdominal or pelvic lymph nodes. Reproductive: Uterus and bilateral adnexa are unremarkable. Other: No abdominal wall hernia or abnormality. No abdominopelvic ascites. Musculoskeletal: Approximate 3 mm anterolisthesis of the L5 vertebral body  is noted on S1. Moderate severity degenerative changes are also seen at this level. IMPRESSION: 1. Proximal partial small bowel obstruction, secondary to mass effect from a heterogeneous mass involving the duodenum and pancreatic tail. This corresponds to findings seen on the prior MR abdomen. 2. Stable marked severity left-sided hydronephrosis with mild, stable right-sided hydroureter. 3. Colonic diverticulosis. 4. Aortic atherosclerosis. Aortic Atherosclerosis (ICD10-I70.0). Electronically Signed   By: Virgina Norfolk M.D.   On: 03/22/2021 21:11   DG Abd Portable 1 View  Result Date: 03/22/2021 CLINICAL DATA:  NG tube EXAM: PORTABLE ABDOMEN - 1 VIEW COMPARISON:  CT 03/22/2021 FINDINGS: Esophageal tube tip overlies the stomach. Suspect side-port is in the region of GE junction. Gas collection in central abdomen probably represents dilated stomach. IMPRESSION: 1. Esophageal tube tip overlies proximal stomach, suspect that the side port is in the region of GE junction, suggest further advancement for more optimal positioning. Electronically Signed   By: Donavan Foil M.D.   On: 03/22/2021 22:42    Anti-infectives: Anti-infectives (From admission, onward)    None        Assessment/Plan Proximal SBO secondary to pancreatic mass - CT 12/27 with proximal psbo secondary to mass effect from heterogeneous mass involving duodenum and pancreatic tail. She also has significant left hydronephrosis, so presumable her left ureter is involved in this as well  - Recommend continuing NG tube  - GI is following. Patient has refused EUS which was scheduled for 03/29/21. IR has been consulted for CT guided biopsy so we can get a tissue diagnosis - check CEA and Ca19-9 - Discussed case with Dr. Zenia Resides. Need CT scan pancreas protocol and CT chest for staging. Patient has iodine allergy (swelling, rash, SOB) - will discuss premedicating with benadryl and prednisone with radiology.  Need more information to determine  if patient would be candidate for distal pancreatectomy with duodenal resection. Likely need to consider bypass to relieve her obstruction acutely, and if adenocarcinoma is confirmed start with neoadjuvant chemo.  She is very malnourished and I discussed starting PICC/TPN with the patient but she declines at this time. She states that she just wants to talk to the doctor and get biopsy done.  Also recommend obtaining medical and cardiac clearance prior to considering any surgery.  - I also agree with palliative consult for goals of care discussions   FEN: NPO, NGT to LIWS, IVF VTE: sq heparin ID: none Foley: none   Severe malnutrition - prealbumin 16.6 Prior h/o cardiac arrhythmia - on diltiazem  HTN GERD T2DM Osteopenia Migraines    LOS: 2 days    [place], [facility] Surgery 03/24/2021, 11:08 AM Please see Amion for pager number during day hours 7:00am-4:30pm

## 2021-03-24 NOTE — Telephone Encounter (Signed)
Just keep the patient's appointment for now as it is a health slot.  If she ends up leaving the hospital or going for second opinion elsewhere, then I would not show up on Tuesday.  I will review that with the inpatient team over the weekend.  Thanks. GI

## 2021-03-24 NOTE — Progress Notes (Signed)
°  Transition of Care (TOC) Screening Note   Patient Details  Name: Leah Pruitt Date of Birth: 1945/12/29   Transition of Care [facility]) CM/SW Contact:    Dessa Phi, RN Phone Number: 03/24/2021, 11:44 AM    Transition of Care Department [facility]) has reviewed patient and no TOC needs have been identified at this time. We will continue to monitor patient advancement through interdisciplinary progression rounds. If new patient transition needs arise, please place a TOC consult.  -spoke to spouse(speaks [place]), Has pcp,pharmacy,own transport home.

## 2021-03-24 NOTE — Progress Notes (Addendum)
Progress Note Hospital Day: 3  Chief Complaint:    discomfort from NG tube  Attending physician's note   I have taken an interval history, reviewed the chart and examined the patient. I agree with the Advanced Practitioner's note, impression and recommendations.   Patient continues to be adamant regarding leaving the hospital and getting the NG tube removed. She continues to refuse Spanish language interpreter  She is refusing endoscopic evaluation and also canceled endoscopic ultrasound that was scheduled for next week  She is not a candidate for CT-guided biopsy given lack of window to safely obtain the biopsy  Multiple different providers explained to patient the reason for NG tube but she continues to refuse ongoing care.  She may benefit from palliative care consult and hospice given she is not interested in pursuing any diagnostic evaluation or treatment at this point  Patient wants to leave AMA    I have spent 35 minutes of patient care (this includes precharting, chart review, review of results, face-to-face time used for counseling as well as treatment plan and follow-up. The patient was provided an opportunity to ask questions and all were answered. The patient agreed with the plan and demonstrated an understanding of the instructions.  Damaris Hippo , MD [phone]     ASSESSMENT AND PLAN    # 75 year old Spanish-speaking female with SBO secondary to mass involving pancreatic tail and duodenum.  Needs EUS with FNA. She is schedule for this as an outpatient on 03/29/20 but hospitalized now with nausea / vomiting related to the partial small bowel obstruction.  --Patient is very unhappy with NGT but it did remain in overnight.  --Our advanced biliary endoscopist does not have any availability to perform EUS any sooner than the 03/29/21 she is already scheduled for. IR has been consulted for CT guided pancreas biopsy. Patient's husband related to me that patient  would have NGT removed and she would leave the hospital if biopsy not done today --Patient' family called our office yesterday to cancel EUS. Thankfully Dr. Rush Landmark asked staff to hold the spot until further notice --I spoke with Georgiana Shore, PA from [facility] this am.  They are getting CT scan with pancreatic protocol.   --EUS with FNA is preferred over CT guided pancreas biopsy as it can give additional information about involvement of adjacent structure. However, it seems unlikely that patient is willing to wait in hospital until 1/3 and if she leaves AMA then will likely return with N/V from SBO.     # [place] anemia, new. Hgb stable at 10.5 Ferritin 34 so may not be iron deficient. Recent positive Cologuard. Was scheduled for colonoscopy which is now on hold given change in clinical course   # GERD --Continue IV PPI    SUBJECTIVE   Very uncomfortable with NGT. No abdominal pain. Leaving hospital today if biopsy not done       OBJECTIVE      Scheduled inpatient medications:   cloNIDine  0.1 mg Transdermal Weekly   insulin aspart  0-9 Units Subcutaneous Q6H   pantoprazole (PROTONIX) IV  40 mg Intravenous Q12H   Continuous inpatient infusions:   lactated ringers 75 mL/hr at 03/24/21 0200   PRN inpatient medications: HYDROmorphone (DILAUDID) injection, LORazepam, ondansetron (ZOFRAN) IV, phenol  Vital signs in last 24 hours: Temp:  [97.5 F (36.4 C)-98.8 F (37.1 C)] 97.5 F (36.4 C) (12/29 0953) Pulse Rate:  [103-124] 108 (12/29 0953) Resp:  [16-18] 16 (12/29 0953) BP: (  121-148)/(81-97) 147/95 (12/29 0953) SpO2:  [96 %-100 %] 99 % (12/29 0953) Last BM Date: 03/22/21  Intake/Output Summary (Last 24 hours) at 03/24/2021 1114 Last data filed at 03/24/2021 0600 Gross per 24 hour  Intake 1525 ml  Output 1700 ml  Net -175 ml     Physical Exam:  General: Alert female in NAD Heart:  Regular rate and rhythm. No lower extremity edema Pulmonary: Normal respiratory  effort Abdomen: Soft, nondistended, nontender. Normal bowel sounds.  Psych: Cooperative.   There were no vitals filed for this visit.  Intake/Output from previous day: 12/28 0701 - 12/29 0700 In: 1900 [I.V.:1800; IV Piggyback:100] Out: 1700 [Emesis/NG output:1700] Intake/Output this shift: No intake/output data recorded.    Lab Results: Recent Labs    03/22/21 1851 03/23/21 0420  WBC 5.7 5.8  HGB 11.8* 10.5*  HCT 36.4 32.0*  PLT 243 210   BMET Recent Labs    03/22/21 1851 03/23/21 0420  NA 133* 133*  K 4.1 3.8  CL 98 107  CO2 28 21*  GLUCOSE 134* 129*  BUN 13 9  CREATININE 1.14* 0.92  CALCIUM 10.1 9.0   LFT Recent Labs    03/23/21 0420  PROT 6.0*  ALBUMIN 3.5  AST 12*  ALT 12  ALKPHOS 77  BILITOT 0.5   PT/INR No results for input(s): LABPROT, INR in the last 72 hours. Hepatitis Panel No results for input(s): HEPBSAG, HCVAB, HEPAIGM, HEPBIGM in the last 72 hours.  CT ABDOMEN PELVIS WO CONTRAST  Result Date: 03/22/2021 CLINICAL DATA:  Abdominal pain x3 days. EXAM: CT ABDOMEN AND PELVIS WITHOUT CONTRAST TECHNIQUE: Multidetector CT imaging of the abdomen and pelvis was performed following the standard protocol without IV contrast. COMPARISON:  Abdomen pelvis CT, dated March 15, 2021 and MR abdomen dated March 16, 2021 FINDINGS: Lower chest: No acute abnormality. Hepatobiliary: No focal liver abnormality is seen. Status post cholecystectomy. No biliary dilatation. Pancreas: A 3.6 cm x 3.4 cm x 3.8 cm area of soft tissue attenuation is again seen along the inferior aspect of the junction of the body and tail of the pancreas (axial CT images 26 through 32, CT series 2). Spleen: Normal in size without focal abnormality. Adrenals/Urinary Tract: Adrenal glands are unremarkable. Moderate severity atrophic changes are seen involving the left kidney. The right kidney is normal in size. No focal lesions are identified. Stable marked severity left-sided hydronephrosis  is seen with mild, stable right-sided hydroureter. Bladder is unremarkable. Stomach/Bowel: There is moderate to marked severity gastric distension. The appendix is surgically absent. The proximal portion of the duodenum is markedly distended (approximately 5.6 cm in diameter). This is increased in caliber when compared to the prior study (measured 4.0 cm and diameter on the prior exam (axial CT image 43, CT series 2). An abrupt transition zone is seen as the duodenum crosses the midline (axial CT images 34 through 41, CT series 2). Noninflamed diverticula are noted throughout the large bowel. Vascular/Lymphatic: Aortic atherosclerosis. No enlarged abdominal or pelvic lymph nodes. Reproductive: Uterus and bilateral adnexa are unremarkable. Other: No abdominal wall hernia or abnormality. No abdominopelvic ascites. Musculoskeletal: Approximate 3 mm anterolisthesis of the L5 vertebral body is noted on S1. Moderate severity degenerative changes are also seen at this level. IMPRESSION: 1. Proximal partial small bowel obstruction, secondary to mass effect from a heterogeneous mass involving the duodenum and pancreatic tail. This corresponds to findings seen on the prior MR abdomen. 2. Stable marked severity left-sided hydronephrosis with mild, stable right-sided hydroureter. 3.  Colonic diverticulosis. 4. Aortic atherosclerosis. Aortic Atherosclerosis (ICD10-I70.0). Electronically Signed   By: Virgina Norfolk M.D.   On: 03/22/2021 21:11   DG Abd Portable 1 View  Result Date: 03/22/2021 CLINICAL DATA:  NG tube EXAM: PORTABLE ABDOMEN - 1 VIEW COMPARISON:  CT 03/22/2021 FINDINGS: Esophageal tube tip overlies the stomach. Suspect side-port is in the region of GE junction. Gas collection in central abdomen probably represents dilated stomach. IMPRESSION: 1. Esophageal tube tip overlies proximal stomach, suspect that the side port is in the region of GE junction, suggest further advancement for more optimal positioning.  Electronically Signed   By: Donavan Foil M.D.   On: 03/22/2021 22:42        Principal Problem:   Small bowel obstruction (HCC) Active Problems:   Essential hypertension   GERD (gastroesophageal reflux disease)   Pancreatic mass   Type 2 diabetes mellitus without complication ([place])     LOS: 2 days   Tye Savoy ,NP 03/24/2021, 11:14 AM

## 2021-03-24 NOTE — Progress Notes (Signed)
Palliative:  I went to have palliative consultation with Leah Pruitt ~1450 and nobody in room. Nursing staff tell me that patient left AMA. Would recommend consideration of outpatient palliative care or consultation with palliative provider Royetta Asal, NP at [facility]. Discussed with Dr. Karleen Hampshire.   No charge  Vinie Sill, NP Palliative Medicine Team Pager [phone] (Please see [email or web address] for schedule) Team Phone [phone]

## 2021-03-24 NOTE — Progress Notes (Signed)
Pt's CBG-58 this Morning. Notified to On-call Gershon Cull NP. Administered 12.5 G 50%Dextrose. Will continue to monitor.

## 2021-03-24 NOTE — Discharge Instructions (Signed)
MEDICAL ONCOLOGY: Follow up with Dr. Benay Spice on 1/06/523 at 2:15 pm as scheduled.                                           [place]

## 2021-03-24 NOTE — Progress Notes (Signed)
°   03/23/21 1929  Assess: MEWS Score  Temp 98.1 F (36.7 C)  BP (!) 141/90  Pulse Rate (!) 124  Resp 18  SpO2 96 %  O2 Device Room Air  Assess: MEWS Score  MEWS Temp 0  MEWS Systolic 0  MEWS Pulse 2  MEWS RR 0  MEWS LOC 0  MEWS Score 2  MEWS Score Color Yellow  Assess: if the MEWS score is Yellow or Red  Were vital signs taken at a resting state? Yes  Focused Assessment No change from prior assessment  Does the patient meet 2 or more of the SIRS criteria? No  MEWS guidelines implemented *See Row Information* Yes  Treat  MEWS Interventions Administered prn meds/treatments  Take Vital Signs  Increase Vital Sign Frequency  Yellow: Q 2hr X 2 then Q 4hr X 2, if remains yellow, continue Q 4hrs  Notify: Charge Nurse/RN  Name of Charge Nurse/RN Notified Richelle RN  Date Charge Nurse/RN Notified 03/23/21  Time Charge Nurse/RN Notified 1929  Notify: Provider  Provider Name/Title Dr Karleen Hampshire  Date Provider Notified 03/23/21  Time Provider Notified  (previous shift RN notified to MD)  Document  Patient Outcome Stabilized after interventions  Progress note created (see row info) Yes  Assess: SIRS CRITERIA  SIRS Temperature  0  SIRS Pulse 1  SIRS Respirations  0  SIRS WBC 0  SIRS Score Sum  1

## 2021-03-24 NOTE — Progress Notes (Incomplete)
PROGRESS NOTE    Leah Pruitt  OIN:[id number] DOB: [birth year] DOA: 03/22/2021 PCP: System, Provider Not In    Chief Complaint  Patient presents with   Abdominal Pain   Nausea   Emesis    Brief Narrative:  75 year old female with a history of diabetes, hypertension, reflux presents to the ER today with abdominal pain, nausea vomiting.  Patient's had weight loss since early part of November.  She was seen by GI on November 30,022.  They started a work-up for her weight loss.  She was seen at [facility] according to the husband last week.  She had a scan which showed a pancreatic mass.  She was discharged to home.  It appears that she was scheduled for an EUS by [facility] on March 29, 2021.   Patient's had increasing pain, nausea, vomiting since last night.  Patient brought to the ER.   CT of the abdomen pelvis here showed a small bowel obstruction with distended duodenum secondary to pancreatic mass.  Pancreatic mass measures 3.6 x 3.4 x 3.8.   EDP is discussed the case with surgery.  NG tube was placed.  GI/Gen Surgery consulted.   Assessment & Plan:   Principal Problem:   Small bowel obstruction (HCC) Active Problems:   Essential hypertension   GERD (gastroesophageal reflux disease)   Pancreatic mass   Type 2 diabetes mellitus without complication (HCC)  Small bowel obstruction secondary to pancreatic mass: -NGT placed, will need further work with EUS, for tissue biopsy vs enteroscopy with duodenal biopsies vs CT guided pancreas biopsy.  - ordered palliative care for goals of care.  - continue with IV fluids and IV anti emetics and IV protonix.    Hypertension; Well controlled.    GERD: ON ppi.    Type 2 DM: CBG (last 3)  Recent Labs    03/24/21 0625 03/24/21 0717 03/24/21 1112  GLUCAP 58* 216* 76    Resume SSI.  Well controlled.     DVT prophylaxis: Heparin.  Code Status: full code.  Family Communication: family at bedside.   Disposition:   Status is: Inpatient  Remains inpatient appropriate because: of SBO.        Consultants:  Gastroenterology General surgery.    Procedures: NG tube placement.   Antimicrobials: none.   Subjective: Abd pain has improved.  Nausea is persistent.   Objective: Vitals:   03/23/21 2122 03/24/21 0030 03/24/21 0426 03/24/21 0953  BP: 121/81 (!) 147/85 137/81 (!) 147/95  Pulse: (!) 108 (!) 107 (!) 108 (!) 108  Resp: 18 18 18 16   Temp: 98 F (36.7 C) 98.7 F (37.1 C) 98.8 F (37.1 C) (!) 97.5 F (36.4 C)  TempSrc: Oral Oral Oral Oral  SpO2: 98% 97% 99% 99%    Intake/Output Summary (Last 24 hours) at 03/24/2021 1325 Last data filed at 03/24/2021 0600 Gross per 24 hour  Intake 1525 ml  Output 1700 ml  Net -175 ml    There were no vitals filed for this visit.  Examination:  General exam: Elderly woman, not in distress.  Respiratory system: Clear to auscultation. Respiratory effort normal. Cardiovascular system: S1 & S2 heard, RRR. No JVD, No pedal edema. Gastrointestinal system: Abdomen is soft, mildly tender, bowel sounds minimal.  Central nervous system: Alert , oriented to place and person.  Extremities: Symmetric 5 x 5 power. Skin: No rashes, lesions or ulcers Psychiatry:  Mood & affect appropriate.     Data Reviewed: I have personally reviewed following labs  and imaging studies  CBC: Recent Labs  Lab 03/22/21 1851 03/23/21 0420  WBC 5.7 5.8  NEUTROABS  --  4.1  HGB 11.8* 10.5*  HCT 36.4 32.0*  MCV 90.8 89.4  PLT 243 210     Basic Metabolic Panel: Recent Labs  Lab 03/22/21 1851 03/23/21 0420  NA 133* 133*  K 4.1 3.8  CL 98 107  CO2 28 21*  GLUCOSE 134* 129*  BUN 13 9  CREATININE 1.14* 0.92  CALCIUM 10.1 9.0  MG  --  1.8     GFR: CrCl cannot be calculated (Unknown ideal weight.).  Liver Function Tests: Recent Labs  Lab 03/22/21 1851 03/23/21 0420  AST 16 12*  ALT 14 12  ALKPHOS 100 77  BILITOT 0.7 0.5  PROT  7.7 6.0*  ALBUMIN 4.2 3.5     CBG: Recent Labs  Lab 03/23/21 1728 03/23/21 2312 03/24/21 0625 03/24/21 0717 03/24/21 1112  GLUCAP 91 75 58* 216* 76      Recent Results (from the past 240 hour(s))  Resp Panel by RT-PCR (Flu A&B, Covid) Nasopharyngeal Swab     Status: None   Collection Time: 03/22/21  6:51 PM   Specimen: Nasopharyngeal Swab; Nasopharyngeal(NP) swabs in vial transport medium  Result Value Ref Range Status   SARS Coronavirus 2 by RT PCR NEGATIVE NEGATIVE Final    Comment: (NOTE) SARS-CoV-2 target nucleic acids are NOT DETECTED.  The SARS-CoV-2 RNA is generally detectable in upper respiratory specimens during the acute phase of infection. The lowest concentration of SARS-CoV-2 viral copies this assay can detect is 138 copies/mL. A negative result does not preclude SARS-Cov-2 infection and should not be used as the sole basis for treatment or other patient management decisions. A negative result may occur with  improper specimen collection/handling, submission of specimen other than nasopharyngeal swab, presence of viral mutation(s) within the areas targeted by this assay, and inadequate number of viral copies(<138 copies/mL). A negative result must be combined with clinical observations, patient history, and epidemiological information. The expected result is Negative.  Fact Sheet for Patients:  EntrepreneurPulse.com.au  Fact Sheet for Healthcare Providers:  IncredibleEmployment.be  This test is no t yet approved or cleared by the [place] FDA and  has been authorized for detection and/or diagnosis of SARS-CoV-2 by FDA under an Emergency Use Authorization (EUA). This EUA will remain  in effect (meaning this test can be used) for the duration of the COVID-19 declaration under Section 564(b)(1) of the Act, 21 U.S.C.section 360bbb-3(b)(1), unless the authorization is terminated  or revoked sooner.       Influenza  A by PCR NEGATIVE NEGATIVE Final   Influenza B by PCR NEGATIVE NEGATIVE Final    Comment: (NOTE) The Xpert Xpress SARS-CoV-2/FLU/RSV plus assay is intended as an aid in the diagnosis of influenza from Nasopharyngeal swab specimens and should not be used as a sole basis for treatment. Nasal washings and aspirates are unacceptable for Xpert Xpress SARS-CoV-2/FLU/RSV testing.  Fact Sheet for Patients: EntrepreneurPulse.com.au  Fact Sheet for Healthcare Providers: IncredibleEmployment.be  This test is not yet approved or cleared by the [place] FDA and has been authorized for detection and/or diagnosis of SARS-CoV-2 by FDA under an Emergency Use Authorization (EUA). This EUA will remain in effect (meaning this test can be used) for the duration of the COVID-19 declaration under Section 564(b)(1) of the Act, 21 U.S.C. section 360bbb-3(b)(1), unless the authorization is terminated or revoked.  Performed at [facility], [address]  [address] [place], [place] [address]           Radiology Studies: CT ABDOMEN PELVIS WO CONTRAST  Result Date: 03/22/2021 CLINICAL DATA:  Abdominal pain x3 days. EXAM: CT ABDOMEN AND PELVIS WITHOUT CONTRAST TECHNIQUE: Multidetector CT imaging of the abdomen and pelvis was performed following the standard protocol without IV contrast. COMPARISON:  Abdomen pelvis CT, dated March 15, 2021 and MR abdomen dated March 16, 2021 FINDINGS: Lower chest: No acute abnormality. Hepatobiliary: No focal liver abnormality is seen. Status post cholecystectomy. No biliary dilatation. Pancreas: A 3.6 cm x 3.4 cm x 3.8 cm area of soft tissue attenuation is again seen along the inferior aspect of the junction of the body and tail of the pancreas (axial CT images 26 through 32, CT series 2). Spleen: Normal in size without focal abnormality. Adrenals/Urinary Tract: Adrenal glands are unremarkable. Moderate severity atrophic  changes are seen involving the left kidney. The right kidney is normal in size. No focal lesions are identified. Stable marked severity left-sided hydronephrosis is seen with mild, stable right-sided hydroureter. Bladder is unremarkable. Stomach/Bowel: There is moderate to marked severity gastric distension. The appendix is surgically absent. The proximal portion of the duodenum is markedly distended (approximately 5.6 cm in diameter). This is increased in caliber when compared to the prior study (measured 4.0 cm and diameter on the prior exam (axial CT image 43, CT series 2). An abrupt transition zone is seen as the duodenum crosses the midline (axial CT images 34 through 41, CT series 2). Noninflamed diverticula are noted throughout the large bowel. Vascular/Lymphatic: Aortic atherosclerosis. No enlarged abdominal or pelvic lymph nodes. Reproductive: Uterus and bilateral adnexa are unremarkable. Other: No abdominal wall hernia or abnormality. No abdominopelvic ascites. Musculoskeletal: Approximate 3 mm anterolisthesis of the L5 vertebral body is noted on S1. Moderate severity degenerative changes are also seen at this level. IMPRESSION: 1. Proximal partial small bowel obstruction, secondary to mass effect from a heterogeneous mass involving the duodenum and pancreatic tail. This corresponds to findings seen on the prior MR abdomen. 2. Stable marked severity left-sided hydronephrosis with mild, stable right-sided hydroureter. 3. Colonic diverticulosis. 4. Aortic atherosclerosis. Aortic Atherosclerosis (ICD10-I70.0). Electronically Signed   By: Virgina Norfolk M.D.   On: 03/22/2021 21:11   DG Abd Portable 1 View  Result Date: 03/22/2021 CLINICAL DATA:  NG tube EXAM: PORTABLE ABDOMEN - 1 VIEW COMPARISON:  CT 03/22/2021 FINDINGS: Esophageal tube tip overlies the stomach. Suspect side-port is in the region of GE junction. Gas collection in central abdomen probably represents dilated stomach. IMPRESSION: 1.  Esophageal tube tip overlies proximal stomach, suspect that the side port is in the region of GE junction, suggest further advancement for more optimal positioning. Electronically Signed   By: Donavan Foil M.D.   On: 03/22/2021 22:42        Scheduled Meds:  cloNIDine  0.1 mg Transdermal Weekly   [START ON 03/25/2021] diphenhydrAMINE  50 mg Oral Once   Or   [START ON 03/25/2021] diphenhydrAMINE  50 mg Intravenous Once   insulin aspart  0-9 Units Subcutaneous Q6H   pantoprazole (PROTONIX) IV  40 mg Intravenous Q12H   predniSONE  50 mg Oral Q6H   Continuous Infusions:  lactated ringers 75 mL/hr at 03/24/21 0200     LOS: 2 days        Hosie Poisson, MD [facility]   To contact the attending provider between 7A-7P or the covering provider during after hours 7P-7A, please log into the web site  [email or web address] and access using universal [place] password for that web site. If you do not have the password, please call the hospital operator.  03/24/2021, 1:25 PM

## 2021-03-25 LAB — CANCER ANTIGEN 19-9: CA 19-9: 143 U/mL — ABNORMAL HIGH (ref 0–35)

## 2021-03-25 LAB — CEA: CEA: 13.5 ng/mL — ABNORMAL HIGH (ref 0.0–4.7)

## 2021-03-28 ENCOUNTER — Encounter (HOSPITAL_COMMUNITY): Payer: Self-pay | Admitting: Anesthesiology

## 2021-03-28 NOTE — Discharge Summary (Signed)
Physician Discharge Summary  Leah Pruitt YDX:[id number] DOB: 12/28/1945 DOA: 03/22/2021  PCP: System, Provider Not In  Admit date: 03/22/2021 Discharge date: 03/24/2021  Pt left AMA.   Brief/Interim Summary:  76 year old female with a history of diabetes, hypertension, reflux presents to the ER today with abdominal pain, nausea vomiting.  Patient's had weight loss since early part of November.  She was seen by GI on November 30,022.  They started a work-up for her weight loss.  She was seen at [facility] according to the husband last week.  She had a scan which showed a pancreatic mass.  She was discharged to home.  It appears that she was scheduled for an EUS by [facility] on March 29, 2021.   Patient's had increasing pain, nausea, vomiting since last night.  Patient brought to the ER.   CT of the abdomen pelvis here showed a small bowel obstruction with distended duodenum secondary to pancreatic mass.  Pancreatic mass measures 3.6 x 3.4 x 3.8.   EDP is discussed the case with surgery.  NG tube was placed.  GI/Gen Surgery consulted.  PT wanted the NG Tube to be removed by RN and left AMA.    Discharge Diagnoses:  Principal Problem:   Small bowel obstruction ([place]) Active Problems:   Essential hypertension   GERD (gastroesophageal reflux disease)   Pancreatic mass   Type 2 diabetes mellitus without complication (HCC)    Discharge Instructions   Allergies as of 03/24/2021       Reactions   Naratriptan Anaphylaxis   Rash, difficult time breathing and swallowing   Penicillins Other (See Comments)   Numbness of mouth and dry mouth   Iodine Swelling   Ciprofloxacin    Other reaction(s): Other (See Comments) Tendon pain   Penicillin G Rash        Medication List     ASK your doctor about these medications    Benicar 20 MG tablet Generic drug: olmesartan Take 20 mg by mouth 2 (two) times daily.   Calcium Carbonate-Vitamin D 600-200 MG-UNIT Tabs Take 1  tablet by mouth 2 (two) times daily with a meal.   Cholecalciferol 25 MCG (1000 UT) tablet Take 2,000 Units by mouth daily.   dicyclomine 20 MG tablet Commonly known as: BENTYL Take 20 mg by mouth daily as needed for spasms.   diltiazem 120 MG 24 hr capsule Commonly known as: TIAZAC Do not take till you talk with your Medical doctor about her blood pressure   furosemide 20 MG tablet Commonly known as: LASIX Take 20 mg by mouth daily.   Glumetza 500 MG (MOD) 24 hr tablet Generic drug: metFORMIN Take 500 mg by mouth 3 (three) times daily.   lubiprostone 24 MCG capsule Commonly known as: AMITIZA Take 1 capsule (24 mcg total) by mouth 2 (two) times daily.   magnesium hydroxide 800 MG/5ML suspension Commonly known as: MILK OF MAGNESIA Take 30 mLs by mouth daily.   meclizine 25 MG tablet Commonly known as: ANTIVERT Take 25 mg by mouth daily as needed for dizziness.   Movantik 12.5 MG Tabs tablet Generic drug: naloxegol oxalate Take 12.5 mg by mouth daily as needed (constipation).   nitrofurantoin (macrocrystal-monohydrate) 100 MG capsule Commonly known as: MACROBID Take 100 mg by mouth daily. continuous   nitroGLYCERIN 0.4 MG SL tablet Commonly known as: NITROSTAT Place 0.4 mg under the tongue every 5 (five) minutes as needed for chest pain.   oxyCODONE 5 MG immediate release tablet Commonly known  as: Oxy IR/ROXICODONE Take 5 mg by mouth every 8 (eight) hours as needed for severe pain.   pantoprazole 40 MG tablet Commonly known as: PROTONIX Take 1 tablet (40 mg total) by mouth 2 (two) times daily.   polyethylene glycol 17 g packet Commonly known as: MiraLax Take 17 g by mouth 2 (two) times daily as needed.   RABEprazole 20 MG tablet Commonly known as: ACIPHEX Take 20 mg by mouth 2 (two) times daily. Ask about: Which instructions should I use?   sucralfate 1 GM/10ML suspension Commonly known as: CARAFATE Take 1 g by mouth 2 (two) times daily. Ask about: Which  instructions should I use?   traMADol 50 MG tablet Commonly known as: ULTRAM Take 1-2 tablets (50-100 mg total) by mouth 3 (three) times daily as needed.        Allergies  Allergen Reactions   Naratriptan Anaphylaxis    Rash, difficult time breathing and swallowing   Penicillins Other (See Comments)    Numbness of mouth and dry mouth   Iodine Swelling   Ciprofloxacin     Other reaction(s): Other (See Comments) Tendon pain   Penicillin G Rash    Consultations: Surgery Palliative care Gastroenterology.    Procedures/Studies: CT ABDOMEN PELVIS WO CONTRAST  Result Date: 03/22/2021 CLINICAL DATA:  Abdominal pain x3 days. EXAM: CT ABDOMEN AND PELVIS WITHOUT CONTRAST TECHNIQUE: Multidetector CT imaging of the abdomen and pelvis was performed following the standard protocol without IV contrast. COMPARISON:  Abdomen pelvis CT, dated March 15, 2021 and MR abdomen dated March 16, 2021 FINDINGS: Lower chest: No acute abnormality. Hepatobiliary: No focal liver abnormality is seen. Status post cholecystectomy. No biliary dilatation. Pancreas: A 3.6 cm x 3.4 cm x 3.8 cm area of soft tissue attenuation is again seen along the inferior aspect of the junction of the body and tail of the pancreas (axial CT images 26 through 32, CT series 2). Spleen: Normal in size without focal abnormality. Adrenals/Urinary Tract: Adrenal glands are unremarkable. Moderate severity atrophic changes are seen involving the left kidney. The right kidney is normal in size. No focal lesions are identified. Stable marked severity left-sided hydronephrosis is seen with mild, stable right-sided hydroureter. Bladder is unremarkable. Stomach/Bowel: There is moderate to marked severity gastric distension. The appendix is surgically absent. The proximal portion of the duodenum is markedly distended (approximately 5.6 cm in diameter). This is increased in caliber when compared to the prior study (measured 4.0 cm and diameter  on the prior exam (axial CT image 43, CT series 2). An abrupt transition zone is seen as the duodenum crosses the midline (axial CT images 34 through 41, CT series 2). Noninflamed diverticula are noted throughout the large bowel. Vascular/Lymphatic: Aortic atherosclerosis. No enlarged abdominal or pelvic lymph nodes. Reproductive: Uterus and bilateral adnexa are unremarkable. Other: No abdominal wall hernia or abnormality. No abdominopelvic ascites. Musculoskeletal: Approximate 3 mm anterolisthesis of the L5 vertebral body is noted on S1. Moderate severity degenerative changes are also seen at this level. IMPRESSION: 1. Proximal partial small bowel obstruction, secondary to mass effect from a heterogeneous mass involving the duodenum and pancreatic tail. This corresponds to findings seen on the prior MR abdomen. 2. Stable marked severity left-sided hydronephrosis with mild, stable right-sided hydroureter. 3. Colonic diverticulosis. 4. Aortic atherosclerosis. Aortic Atherosclerosis (ICD10-I70.0). Electronically Signed   By: Virgina Norfolk M.D.   On: 03/22/2021 21:11   DG Abd Portable 1 View  Result Date: 03/22/2021 CLINICAL DATA:  NG tube EXAM: PORTABLE ABDOMEN -  1 VIEW COMPARISON:  CT 03/22/2021 FINDINGS: Esophageal tube tip overlies the stomach. Suspect side-port is in the region of GE junction. Gas collection in central abdomen probably represents dilated stomach. IMPRESSION: 1. Esophageal tube tip overlies proximal stomach, suspect that the side port is in the region of GE junction, suggest further advancement for more optimal positioning. Electronically Signed   By: Donavan Foil M.D.   On: 03/22/2021 22:42     Discharge Exam: Vitals:   03/24/21 0426 03/24/21 0953  BP: 137/81 (!) 147/95  Pulse: (!) 108 (!) 108  Resp: 18 16  Temp: 98.8 F (37.1 C) (!) 97.5 F (36.4 C)  SpO2: 99% 99%   Vitals:   03/23/21 2122 03/24/21 0030 03/24/21 0426 03/24/21 0953  BP: 121/81 (!) 147/85 137/81 (!) 147/95   Pulse: (!) 108 (!) 107 (!) 108 (!) 108  Resp: 18 18 18 16   Temp: 98 F (36.7 C) 98.7 F (37.1 C) 98.8 F (37.1 C) (!) 97.5 F (36.4 C)  TempSrc: Oral Oral Oral Oral  SpO2: 98% 97% 99% 99%        The results of significant diagnostics from this hospitalization (including imaging, microbiology, ancillary and laboratory) are listed below for reference.     Microbiology: Recent Results (from the past 240 hour(s))  Resp Panel by RT-PCR (Flu A&B, Covid) Nasopharyngeal Swab     Status: None   Collection Time: 03/22/21  6:51 PM   Specimen: Nasopharyngeal Swab; Nasopharyngeal(NP) swabs in vial transport medium  Result Value Ref Range Status   SARS Coronavirus 2 by RT PCR NEGATIVE NEGATIVE Final    Comment: (NOTE) SARS-CoV-2 target nucleic acids are NOT DETECTED.  The SARS-CoV-2 RNA is generally detectable in upper respiratory specimens during the acute phase of infection. The lowest concentration of SARS-CoV-2 viral copies this assay can detect is 138 copies/mL. A negative result does not preclude SARS-Cov-2 infection and should not be used as the sole basis for treatment or other patient management decisions. A negative result may occur with  improper specimen collection/handling, submission of specimen other than nasopharyngeal swab, presence of viral mutation(s) within the areas targeted by this assay, and inadequate number of viral copies(<138 copies/mL). A negative result must be combined with clinical observations, patient history, and epidemiological information. The expected result is Negative.  Fact Sheet for Patients:  EntrepreneurPulse.com.au  Fact Sheet for Healthcare Providers:  IncredibleEmployment.be  This test is no t yet approved or cleared by the [place] FDA and  has been authorized for detection and/or diagnosis of SARS-CoV-2 by FDA under an Emergency Use Authorization (EUA). This EUA will remain  in effect  (meaning this test can be used) for the duration of the COVID-19 declaration under Section 564(b)(1) of the Act, 21 U.S.C.section 360bbb-3(b)(1), unless the authorization is terminated  or revoked sooner.       Influenza A by PCR NEGATIVE NEGATIVE Final   Influenza B by PCR NEGATIVE NEGATIVE Final    Comment: (NOTE) The Xpert Xpress SARS-CoV-2/FLU/RSV plus assay is intended as an aid in the diagnosis of influenza from Nasopharyngeal swab specimens and should not be used as a sole basis for treatment. Nasal washings and aspirates are unacceptable for Xpert Xpress SARS-CoV-2/FLU/RSV testing.  Fact Sheet for Patients: EntrepreneurPulse.com.au  Fact Sheet for Healthcare Providers: IncredibleEmployment.be  This test is not yet approved or cleared by the [place] FDA and has been authorized for detection and/or diagnosis of SARS-CoV-2 by FDA under an Emergency Use Authorization (EUA). This EUA will  remain in effect (meaning this test can be used) for the duration of the COVID-19 declaration under Section 564(b)(1) of the Act, 21 U.S.C. section 360bbb-3(b)(1), unless the authorization is terminated or revoked.  Performed at [facility], [place] [address], [place], [place] [address]      Labs: BNP (last 3 results) No results for input(s): BNP in the last 8760 hours. Basic Metabolic Panel: Recent Labs  Lab 03/22/21 1851 03/23/21 0420  NA 133* 133*  K 4.1 3.8  CL 98 107  CO2 28 21*  GLUCOSE 134* 129*  BUN 13 9  CREATININE 1.14* 0.92  CALCIUM 10.1 9.0  MG  --  1.8   Liver Function Tests: Recent Labs  Lab 03/22/21 1851 03/23/21 0420  AST 16 12*  ALT 14 12  ALKPHOS 100 77  BILITOT 0.7 0.5  PROT 7.7 6.0*  ALBUMIN 4.2 3.5   Recent Labs  Lab 03/22/21 1851 03/23/21 0420  LIPASE 34 31   No results for input(s): AMMONIA in the last 168 hours. CBC: Recent Labs  Lab 03/22/21 1851 03/23/21 0420  WBC 5.7  5.8  NEUTROABS  --  4.1  HGB 11.8* 10.5*  HCT 36.4 32.0*  MCV 90.8 89.4  PLT 243 210   Cardiac Enzymes: No results for input(s): CKTOTAL, CKMB, CKMBINDEX, TROPONINI in the last 168 hours. BNP: Invalid input(s): POCBNP CBG: Recent Labs  Lab 03/23/21 1728 03/23/21 2312 03/24/21 0625 03/24/21 0717 03/24/21 1112  GLUCAP 91 75 58* 216* 76   D-Dimer No results for input(s): DDIMER in the last 72 hours. Hgb A1c No results for input(s): HGBA1C in the last 72 hours. Lipid Profile No results for input(s): CHOL, HDL, LDLCALC, TRIG, CHOLHDL, LDLDIRECT in the last 72 hours. Thyroid function studies No results for input(s): TSH, T4TOTAL, T3FREE, THYROIDAB in the last 72 hours.  Invalid input(s): FREET3 Anemia work up No results for input(s): VITAMINB12, FOLATE, FERRITIN, TIBC, IRON, RETICCTPCT in the last 72 hours. Urinalysis    Component Value Date/Time   COLORURINE COLORLESS (A) 03/22/2021 1851   APPEARANCEUR CLEAR 03/22/2021 1851   LABSPEC 1.005 03/22/2021 1851   PHURINE 9.0 (H) 03/22/2021 1851   GLUCOSEU NEGATIVE 03/22/2021 1851   HGBUR NEGATIVE 03/22/2021 1851   BILIRUBINUR NEGATIVE 03/22/2021 1851   KETONESUR NEGATIVE 03/22/2021 1851   PROTEINUR NEGATIVE 03/22/2021 1851   UROBILINOGEN 0.2 11/15/2012 2157   NITRITE NEGATIVE 03/22/2021 1851   LEUKOCYTESUR NEGATIVE 03/22/2021 1851   Sepsis Labs Invalid input(s): PROCALCITONIN,  WBC,  LACTICIDVEN Microbiology Recent Results (from the past 240 hour(s))  Resp Panel by RT-PCR (Flu A&B, Covid) Nasopharyngeal Swab     Status: None   Collection Time: 03/22/21  6:51 PM   Specimen: Nasopharyngeal Swab; Nasopharyngeal(NP) swabs in vial transport medium  Result Value Ref Range Status   SARS Coronavirus 2 by RT PCR NEGATIVE NEGATIVE Final    Comment: (NOTE) SARS-CoV-2 target nucleic acids are NOT DETECTED.  The SARS-CoV-2 RNA is generally detectable in upper respiratory specimens during the acute phase of infection. The  lowest concentration of SARS-CoV-2 viral copies this assay can detect is 138 copies/mL. A negative result does not preclude SARS-Cov-2 infection and should not be used as the sole basis for treatment or other patient management decisions. A negative result may occur with  improper specimen collection/handling, submission of specimen other than nasopharyngeal swab, presence of viral mutation(s) within the areas targeted by this assay, and inadequate number of viral copies(<138 copies/mL). A negative result must be combined with  clinical observations, patient history, and epidemiological information. The expected result is Negative.  Fact Sheet for Patients:  EntrepreneurPulse.com.au  Fact Sheet for Healthcare Providers:  IncredibleEmployment.be  This test is no t yet approved or cleared by the [place] FDA and  has been authorized for detection and/or diagnosis of SARS-CoV-2 by FDA under an Emergency Use Authorization (EUA). This EUA will remain  in effect (meaning this test can be used) for the duration of the COVID-19 declaration under Section 564(b)(1) of the Act, 21 U.S.C.section 360bbb-3(b)(1), unless the authorization is terminated  or revoked sooner.       Influenza A by PCR NEGATIVE NEGATIVE Final   Influenza B by PCR NEGATIVE NEGATIVE Final    Comment: (NOTE) The Xpert Xpress SARS-CoV-2/FLU/RSV plus assay is intended as an aid in the diagnosis of influenza from Nasopharyngeal swab specimens and should not be used as a sole basis for treatment. Nasal washings and aspirates are unacceptable for Xpert Xpress SARS-CoV-2/FLU/RSV testing.  Fact Sheet for Patients: EntrepreneurPulse.com.au  Fact Sheet for Healthcare Providers: IncredibleEmployment.be  This test is not yet approved or cleared by the [place] FDA and has been authorized for detection and/or diagnosis of SARS-CoV-2 by FDA under  an Emergency Use Authorization (EUA). This EUA will remain in effect (meaning this test can be used) for the duration of the COVID-19 declaration under Section 564(b)(1) of the Act, 21 U.S.C. section 360bbb-3(b)(1), unless the authorization is terminated or revoked.  Performed at [facility], [place] [address], [place], [place] [address]       SIGNED:   Hosie Poisson, MD  [facility]

## 2021-03-28 NOTE — Anesthesia Preprocedure Evaluation (Deleted)
Anesthesia Evaluation    Reviewed: Allergy & Precautions, Patient's Chart, lab work & pertinent test results  History of Anesthesia Complications Negative for: history of anesthetic complications  Airway        Dental   Pulmonary neg pulmonary ROS,           Cardiovascular hypertension,      Neuro/Psych negative neurological ROS     GI/Hepatic Neg liver ROS, GERD  ,Pancreatic mass   Endo/Other  diabetes, Oral Hypoglycemic Agents  Renal/GU negative Renal ROS  negative genitourinary   Musculoskeletal negative musculoskeletal ROS (+)   Abdominal   Peds  Hematology  (+) anemia ,   Anesthesia Other Findings   Reproductive/Obstetrics                             Anesthesia Physical Anesthesia Plan  ASA: 3  Anesthesia Plan: MAC   Post-op Pain Management: Minimal or no pain anticipated   Induction: Intravenous  PONV Risk Score and Plan: 2 and Propofol infusion, TIVA and Treatment may vary due to age or medical condition  Airway Management Planned: Natural Airway, Nasal Cannula and Simple Face Mask  Additional Equipment: None  Intra-op Plan:   Post-operative Plan:   Informed Consent:   Plan Discussed with:   Anesthesia Plan Comments:         Anesthesia Quick Evaluation

## 2021-03-29 ENCOUNTER — Ambulatory Visit (HOSPITAL_COMMUNITY): Admission: RE | Admit: 2021-03-29 | Payer: Medicare Other | Source: Ambulatory Visit | Admitting: Gastroenterology

## 2021-03-29 ENCOUNTER — Encounter (HOSPITAL_COMMUNITY): Admission: RE | Payer: Self-pay | Source: Ambulatory Visit

## 2021-03-29 SURGERY — UPPER ESOPHAGEAL ENDOSCOPIC ULTRASOUND (EUS)
Anesthesia: Monitor Anesthesia Care

## 2021-03-30 ENCOUNTER — Telehealth: Payer: Self-pay | Admitting: Hematology and Oncology

## 2021-03-30 NOTE — Telephone Encounter (Signed)
Patient scheduled w/Melissa for Pancreatic Mass.  Appt made for 03/31/21 Labs 10:00 am - Consult 10:30 am

## 2021-03-31 ENCOUNTER — Inpatient Hospital Stay (INDEPENDENT_AMBULATORY_CARE_PROVIDER_SITE_OTHER): Payer: Medicare Other | Admitting: Hematology and Oncology

## 2021-03-31 ENCOUNTER — Encounter: Payer: Self-pay | Admitting: Hematology and Oncology

## 2021-03-31 ENCOUNTER — Other Ambulatory Visit: Payer: Self-pay | Admitting: Hematology and Oncology

## 2021-03-31 ENCOUNTER — Other Ambulatory Visit: Payer: Self-pay

## 2021-03-31 ENCOUNTER — Inpatient Hospital Stay: Payer: Medicare Other | Attending: Oncology

## 2021-03-31 VITALS — BP 130/84 | HR 113 | Temp 98.8°F | Resp 20 | Ht 61.0 in | Wt 114.4 lb

## 2021-03-31 DIAGNOSIS — E119 Type 2 diabetes mellitus without complications: Secondary | ICD-10-CM | POA: Diagnosis not present

## 2021-03-31 DIAGNOSIS — Z8049 Family history of malignant neoplasm of other genital organs: Secondary | ICD-10-CM | POA: Diagnosis not present

## 2021-03-31 DIAGNOSIS — Z801 Family history of malignant neoplasm of trachea, bronchus and lung: Secondary | ICD-10-CM

## 2021-03-31 DIAGNOSIS — K8689 Other specified diseases of pancreas: Secondary | ICD-10-CM | POA: Diagnosis not present

## 2021-03-31 DIAGNOSIS — D649 Anemia, unspecified: Secondary | ICD-10-CM | POA: Diagnosis not present

## 2021-03-31 DIAGNOSIS — I10 Essential (primary) hypertension: Secondary | ICD-10-CM

## 2021-03-31 DIAGNOSIS — Z79899 Other long term (current) drug therapy: Secondary | ICD-10-CM | POA: Diagnosis not present

## 2021-03-31 DIAGNOSIS — Z7984 Long term (current) use of oral hypoglycemic drugs: Secondary | ICD-10-CM

## 2021-03-31 DIAGNOSIS — Z800 Family history of malignant neoplasm of digestive organs: Secondary | ICD-10-CM

## 2021-03-31 LAB — CBC AND DIFFERENTIAL
HCT: 36 (ref 36–46)
Hemoglobin: 12.2 (ref 12.0–16.0)
Neutrophils Absolute: 5.45
Platelets: 319 (ref 150–399)
WBC: 6.9

## 2021-03-31 LAB — BASIC METABOLIC PANEL
BUN: 17 (ref 4–21)
CO2: 23 — AB (ref 13–22)
Chloride: 100 (ref 99–108)
Creatinine: 1.2 — AB (ref 0.5–1.1)
Glucose: 136
Potassium: 4.5 (ref 3.4–5.3)
Sodium: 133 — AB (ref 137–147)

## 2021-03-31 LAB — HEPATIC FUNCTION PANEL
ALT: 20 (ref 7–35)
AST: 29 (ref 13–35)
Alkaline Phosphatase: 111 (ref 25–125)
Bilirubin, Total: 0.6

## 2021-03-31 LAB — CBC: RBC: 4.14 (ref 3.87–5.11)

## 2021-03-31 LAB — COMPREHENSIVE METABOLIC PANEL
Albumin: 4.5 (ref 3.5–5.0)
Calcium: 9.5 (ref 8.7–10.7)

## 2021-03-31 NOTE — Progress Notes (Signed)
[facility]  [address] [name],  [place]  [address] [phone]  Clinic Day:  03/31/2021  Referring physician: No ref. provider found   CHIEF COMPLAINT:  CC: A 76 year old female with pancreatic mass here for continued workup  Current Treatment:  Diagnostics   HISTORY OF PRESENT ILLNESS:  Leah Pruitt is a 76 y.o. female with a history of abdominal pain who is referred in consultation with [employer] for assessment and management. She has ongoing abdominal pain for several years, often treated with cipro for diverticulitis. The pain became more severe with nausea and vomiting over the last couple of months. CT imaging of the abdomen/ pelvis 03/15/21 reveals solid-appearing 3.9 cm mass appears to arise from the inferior pancreatic body, concerning for pancreatic neoplasm. MRI performed on 03/16/2021 revealed an ill-defined hypovascular mass involving the pancreatic tail, distal duodenum causing duodenal obstruction, and left UPJ causing moderate left hydronephrosis. Differential includes primary pancreatic carcinoma, and metastatic disease. Patient was scheduled for biopsy via EUS on 03/29/2021; however, required hospitalization regarding increased nausea, vomiting and pain. During this hospital stay, the patient's symptoms were managed with insertion of NG tube. This was quite uncomfortable for the patient and led to the patient's frustration with being hospitalized without biopsy. She ultimately left the hospital AMA and canceled her EUS appointment on the 3rd. Of note, she also had a positive Cologuard test result and was advised to undergo colonoscopy.   Today she presents to clinic with continued abdominal and back pain. She continues to have nausea and vomiting and is only able to keep small amounts of food down. She has chronic constipation, but this seems worse over the last week. She denies any burning or pain with urination, although admits  to frequent UTIs in the past. She denies fever, chills, shortness of breath or cough. She has increased fatigue and stress regarding her new situation. Her husband recently lost his job and they are both fearful of financial stressors. We did discuss the imaging results and the need to obtain tissue biopsy for diagnosis. She is agreeable to this plan; however, she does not want to return to [facility] for biopsy. She is requesting [facility] referral for this procedure.   REVIEW OF SYSTEMS:  Review of Systems  Constitutional:  Positive for fatigue. Negative for appetite change, chills, diaphoresis, fever and unexpected weight change.  HENT:   Negative for hearing loss, lump/mass, mouth sores, nosebleeds, sore throat, tinnitus, trouble swallowing and voice change.   Eyes:  Negative for eye problems and icterus.  Respiratory:  Negative for chest tightness, cough, hemoptysis, shortness of breath and wheezing.   Cardiovascular:  Negative for chest pain, leg swelling and palpitations.  Gastrointestinal:  Positive for abdominal pain, blood in stool, constipation, nausea and vomiting. Negative for abdominal distention, diarrhea and rectal pain.  Endocrine: Negative for hot flashes.  Genitourinary:  Negative for bladder incontinence, difficulty urinating, dyspareunia, dysuria, frequency, hematuria and nocturia.   Musculoskeletal:  Positive for back pain. Negative for arthralgias, flank pain, gait problem, myalgias, neck pain and neck stiffness.  Skin:  Negative for itching, rash and wound.  Neurological:  Negative for dizziness, extremity weakness, gait problem, headaches, light-headedness, numbness, seizures and speech difficulty.  Hematological:  Negative for adenopathy. Does not bruise/bleed easily.  Psychiatric/Behavioral:  Negative for confusion, decreased concentration, depression, sleep disturbance and suicidal ideas. The patient is nervous/anxious.     VITALS:  Blood pressure 130/84, pulse (!)  113, temperature 98.8 F (37.1  C), temperature source Oral, resp. rate 20, height 5\' 1"  (1.549 m), weight 114 lb 6.4 oz (51.9 kg), SpO2 98 %.  Wt Readings from Last 3 Encounters:  03/31/21 114 lb 6.4 oz (51.9 kg)  02/23/21 125 lb 2 oz (56.8 kg)  10/11/16 137 lb (62.1 kg)    Body mass index is 21.62 kg/m.  Performance status (ECOG): 1 - Symptomatic but completely ambulatory  PHYSICAL EXAM:  Physical Exam Constitutional:      General: She is not in acute distress.    Appearance: Normal appearance. She is normal weight. She is not ill-appearing, toxic-appearing or diaphoretic.  HENT:     Head: Normocephalic and atraumatic.     Right Ear: Tympanic membrane normal.     Left Ear: Tympanic membrane normal.     Nose: Nose normal. No congestion or rhinorrhea.     Mouth/Throat:     Mouth: Mucous membranes are moist.     Pharynx: Oropharynx is clear. No oropharyngeal exudate or posterior oropharyngeal erythema.  Eyes:     General: No scleral icterus.       Right eye: No discharge.        Left eye: No discharge.     Extraocular Movements: Extraocular movements intact.     Conjunctiva/sclera: Conjunctivae normal.     Pupils: Pupils are equal, round, and reactive to light.  Neck:     Vascular: No carotid bruit.  Cardiovascular:     Rate and Rhythm: Normal rate and regular rhythm.     Heart sounds: No murmur heard.   No friction rub. No gallop.  Pulmonary:     Effort: Pulmonary effort is normal. No respiratory distress.     Breath sounds: Normal breath sounds. No stridor. No wheezing, rhonchi or rales.  Chest:     Chest wall: No tenderness.  Abdominal:     General: Abdomen is flat. Bowel sounds are normal. There is no distension.     Palpations: There is no mass.     Tenderness: There is no abdominal tenderness. There is no right CVA tenderness, left CVA tenderness, guarding or rebound.     Hernia: No hernia is present.  Musculoskeletal:        General: No swelling, tenderness,  deformity or signs of injury. Normal range of motion.     Cervical back: Normal range of motion and neck supple. No rigidity or tenderness.     Right lower leg: No edema.     Left lower leg: No edema.  Lymphadenopathy:     Cervical: No cervical adenopathy.  Skin:    General: Skin is warm and dry.     Capillary Refill: Capillary refill takes less than 2 seconds.     Coloration: Skin is not jaundiced or pale.     Findings: No bruising, erythema, lesion or rash.  Neurological:     General: No focal deficit present.     Mental Status: She is alert and oriented to person, place, and time. Mental status is at baseline.     Cranial Nerves: No cranial nerve deficit.     Sensory: No sensory deficit.     Motor: No weakness.     Coordination: Coordination normal.     Gait: Gait normal.     Deep Tendon Reflexes: Reflexes normal.  Psychiatric:        Mood and Affect: Mood normal.        Behavior: Behavior normal.        Thought Content: Thought  content normal.        Judgment: Judgment normal.     LABS:   CBC Latest Ref Rng & Units 03/31/2021 03/23/2021 03/22/2021  WBC - 6.9 5.8 5.7  Hemoglobin 12.0 - 16.0 12.2 10.5(L) 11.8(L)  Hematocrit 36 - 46 36 32.0(L) 36.4  Platelets 150 - 399 319 210 243   CMP Latest Ref Rng & Units 03/31/2021 03/23/2021 03/22/2021  Glucose 70 - 99 mg/dL - 129(H) 134(H)  BUN 4 - 21 17 9 13   Creatinine 0.5 - 1.1 1.2(A) 0.92 1.14(H)  Sodium 137 - 147 133(A) 133(L) 133(L)  Potassium 3.4 - 5.3 4.5 3.8 4.1  Chloride 99 - 108 100 107 98  CO2 13 - 22 23(A) 21(L) 28  Calcium 8.7 - 10.7 9.5 9.0 10.1  Total Protein 6.5 - 8.1 g/dL - 6.0(L) 7.7  Total Bilirubin 0.3 - 1.2 mg/dL - 0.5 0.7  Alkaline Phos 25 - 125 111 77 100  AST 13 - 35 29 12(L) 16  ALT 7 - 35 20 12 14      Lab Results  Component Value Date   CEA1 13.5 (H) 03/24/2021   /  CEA  Date Value Ref Range Status  03/24/2021 13.5 (H) 0.0 - 4.7 ng/mL Final    Comment:    (NOTE)                              Nonsmokers          <3.9                             Smokers             <5.6 [employer] Electrochemiluminescence Immunoassay (ECLIA) Values obtained with different assay methods or kits cannot be used interchangeably.  Results cannot be interpreted as absolute evidence of the presence or absence of malignant disease. Performed At: [facility] [place], [place] [id number] Rush Farmer MD GY:[id number]    No results found for: PSA1 Lab Results  Component Value Date   FFM384 665 (H) 03/24/2021   No results found for: LDJ570  No results found for: TOTALPROTELP, ALBUMINELP, A1GS, A2GS, BETS, BETA2SER, GAMS, MSPIKE, SPEI Lab Results  Component Value Date   FERRITIN 34 08/22/2020   No results found for: LDH  STUDIES:  CT ABDOMEN PELVIS WO CONTRAST  Result Date: 03/22/2021 CLINICAL DATA:  Abdominal pain x3 days. EXAM: CT ABDOMEN AND PELVIS WITHOUT CONTRAST TECHNIQUE: Multidetector CT imaging of the abdomen and pelvis was performed following the standard protocol without IV contrast. COMPARISON:  Abdomen pelvis CT, dated March 15, 2021 and MR abdomen dated March 16, 2021 FINDINGS: Lower chest: No acute abnormality. Hepatobiliary: No focal liver abnormality is seen. Status post cholecystectomy. No biliary dilatation. Pancreas: A 3.6 cm x 3.4 cm x 3.8 cm area of soft tissue attenuation is again seen along the inferior aspect of the junction of the body and tail of the pancreas (axial CT images 26 through 32, CT series 2). Spleen: Normal in size without focal abnormality. Adrenals/Urinary Tract: Adrenal glands are unremarkable. Moderate severity atrophic changes are seen involving the left kidney. The right kidney is normal in size. No focal lesions are identified. Stable marked severity left-sided hydronephrosis is seen with mild, stable right-sided hydroureter. Bladder is unremarkable. Stomach/Bowel: There is moderate to marked severity gastric distension.  The appendix is surgically absent. The proximal  portion of the duodenum is markedly distended (approximately 5.6 cm in diameter). This is increased in caliber when compared to the prior study (measured 4.0 cm and diameter on the prior exam (axial CT image 43, CT series 2). An abrupt transition zone is seen as the duodenum crosses the midline (axial CT images 34 through 41, CT series 2). Noninflamed diverticula are noted throughout the large bowel. Vascular/Lymphatic: Aortic atherosclerosis. No enlarged abdominal or pelvic lymph nodes. Reproductive: Uterus and bilateral adnexa are unremarkable. Other: No abdominal wall hernia or abnormality. No abdominopelvic ascites. Musculoskeletal: Approximate 3 mm anterolisthesis of the L5 vertebral body is noted on S1. Moderate severity degenerative changes are also seen at this level. IMPRESSION: 1. Proximal partial small bowel obstruction, secondary to mass effect from a heterogeneous mass involving the duodenum and pancreatic tail. This corresponds to findings seen on the prior MR abdomen. 2. Stable marked severity left-sided hydronephrosis with mild, stable right-sided hydroureter. 3. Colonic diverticulosis. 4. Aortic atherosclerosis. Aortic Atherosclerosis (ICD10-I70.0). Electronically Signed   By: Virgina Norfolk M.D.   On: 03/22/2021 21:11   DG Abd Portable 1 View  Result Date: 03/22/2021 CLINICAL DATA:  NG tube EXAM: PORTABLE ABDOMEN - 1 VIEW COMPARISON:  CT 03/22/2021 FINDINGS: Esophageal tube tip overlies the stomach. Suspect side-port is in the region of GE junction. Gas collection in central abdomen probably represents dilated stomach. IMPRESSION: 1. Esophageal tube tip overlies proximal stomach, suspect that the side port is in the region of GE junction, suggest further advancement for more optimal positioning. Electronically Signed   By: Donavan Foil M.D.   On: 03/22/2021 22:42      HISTORY:   Past Medical History:  Diagnosis Date   Appendicitis,  acute 11/19/2012   Arrhythmia    Arthritis    Back pain    Cardiac arrhythmia    Colles' fracture of right radius, init for clos fx 07/09/2017   Diabetes ([place])    Diverticulosis    FH: cholecystectomy    Gastroesophageal reflux disease    GERD (gastroesophageal reflux disease) 11/19/2012   Headache    HTN (hypertension)    Lipoma    Scalp   Migraines    Osteoporosis    Pain in right wrist 07/09/2017   Unspecified essential hypertension 11/19/2012    Past Surgical History:  Procedure Laterality Date   CESAREAN SECTION     CHOLECYSTECTOMY     LAPAROSCOPIC APPENDECTOMY N/A 11/16/2012   Procedure: APPENDECTOMY LAPAROSCOPIC;  Surgeon: Imogene Burn. Georgette Dover, MD;  Location: [facility];  Service: General;  Laterality: N/A;   LAPAROSCOPIC LYSIS OF ADHESIONS Bilateral 11/16/2012   Procedure: LAPAROSCOPIC LYSIS OF ADHESIONS;  Surgeon: Imogene Burn. Georgette Dover, MD;  Location: [facility];  Service: General;  Laterality: Bilateral;   TONSILLECTOMY      Family History  Problem Relation Age of Onset   Stroke Mother 33       deceased   Hypertension Mother    Diabetes Mellitus I Mother    Lung cancer Father 79       deceased   Hypertension Sister    Uterine cancer Sister    Stomach cancer Brother     Social History:  reports that she has never smoked. She has never used smokeless tobacco. She reports that she does not drink alcohol and does not use drugs.The patient is accompanied by her husband today.  Allergies:  Allergies  Allergen Reactions   Naratriptan Anaphylaxis    Rash, difficult time breathing and swallowing   Penicillins  Other (See Comments)    Numbness of mouth and dry mouth   Iodine Swelling   Ciprofloxacin     Other reaction(s): Other (See Comments) Tendon pain   Penicillin G Rash    Current Medications: Current Outpatient Medications  Medication Sig Dispense Refill   BENICAR 20 MG tablet Take 20 mg by mouth 2 (two) times daily.     Calcium Carbonate-Vitamin D 600-200 MG-UNIT TABS  Take 1 tablet by mouth 2 (two) times daily with a meal.     dicyclomine (BENTYL) 20 MG tablet Take 20 mg by mouth daily as needed for spasms.     diltiazem (TIAZAC) 120 MG 24 hr capsule Do not take till you talk with your Medical doctor about her blood pressure (Patient taking differently: Take 120 mg by mouth daily.)     furosemide (LASIX) 20 MG tablet Take 20 mg by mouth daily.  4   GLUMETZA 500 MG 24 hr tablet Take 500 mg by mouth 3 (three) times daily.     magnesium hydroxide (MILK OF MAGNESIA) 800 MG/5ML suspension Take 30 mLs by mouth daily.     meclizine (ANTIVERT) 25 MG tablet Take 25 mg by mouth daily as needed for dizziness.     MOVANTIK 12.5 MG TABS tablet Take 12.5 mg by mouth daily as needed (constipation).     nitrofurantoin, macrocrystal-monohydrate, (MACROBID) 100 MG capsule Take 100 mg by mouth daily. continuous     nitroGLYCERIN (NITROSTAT) 0.4 MG SL tablet Place 0.4 mg under the tongue every 5 (five) minutes as needed for chest pain.     oxyCODONE (OXY IR/ROXICODONE) 5 MG immediate release tablet Take 5 mg by mouth every 8 (eight) hours as needed for severe pain.     pantoprazole (PROTONIX) 40 MG tablet Take 1 tablet (40 mg total) by mouth 2 (two) times daily. 120 tablet 1   RABEprazole (ACIPHEX) 20 MG tablet Take 20 mg by mouth 2 (two) times daily.     sucralfate (CARAFATE) 1 GM/10ML suspension Take 1 g by mouth 2 (two) times daily.     traMADol (ULTRAM) 50 MG tablet Take 1-2 tablets (50-100 mg total) by mouth 3 (three) times daily as needed. (Patient taking differently: Take 50-100 mg by mouth 3 (three) times daily as needed for moderate pain.) 50 tablet 0   No current facility-administered medications for this visit.     ASSESSMENT & PLAN:   Assessment:  Leah Pruitt is a 75 y.o. female with known pancreatic mass here for continued evaluation. She was scheduled for biopsy, but was not happy with the care she received while hospitalized and canceled that appointment. She  is understanding of the seriousness of her situation and is agreeable to tissue sampling.   Plan: 1.  We will send referral to [facility] to schedule patient for pancreatic biopsy. She will return to clinic once pathology is obtained to discuss further treatment options.   I discussed the assessment and treatment plan with the patient.  The patient was provided an opportunity to ask questions and all were answered.  The patient agreed with the plan and demonstrated an understanding of the instructions.  The patient was advised to call back if the symptoms worsen or if the condition fails to improve as anticipated.  Thank you for the opportunity       Melodye Ped, NP

## 2021-04-01 ENCOUNTER — Inpatient Hospital Stay: Payer: Medicare Other | Admitting: Nurse Practitioner

## 2021-04-06 ENCOUNTER — Other Ambulatory Visit: Payer: Self-pay | Admitting: Hematology and Oncology

## 2021-04-06 DIAGNOSIS — E46 Unspecified protein-calorie malnutrition: Secondary | ICD-10-CM | POA: Diagnosis not present

## 2021-04-06 DIAGNOSIS — K315 Obstruction of duodenum: Secondary | ICD-10-CM | POA: Diagnosis not present

## 2021-04-06 DIAGNOSIS — R1114 Bilious vomiting: Secondary | ICD-10-CM | POA: Diagnosis not present

## 2021-04-06 DIAGNOSIS — K8689 Other specified diseases of pancreas: Secondary | ICD-10-CM | POA: Diagnosis not present

## 2021-04-06 MED ORDER — PROCHLORPERAZINE MALEATE 10 MG PO TABS: 10.0000 mg | ORAL_TABLET | Freq: Four times a day (QID) | ORAL | 0 refills | Status: DC | PRN

## 2021-04-06 NOTE — Progress Notes (Signed)
Call from [facility] at Dr. Maxie Barb office.  The patient's husband is seeing Dr. Nila Nephew and states his wife has persistent nausea and vomiting despite ondansetron.  She is scheduled to be seen at [facility] surgery in [place] this afternoon regarding pancreatic biopsy.  He is requesting alternative medication for the nausea and vomiting.  Sent in prescription for prochlorperazine.

## 2021-04-07 ENCOUNTER — Inpatient Hospital Stay: Payer: Self-pay

## 2021-04-07 ENCOUNTER — Inpatient Hospital Stay (HOSPITAL_COMMUNITY)
Admission: AD | Admit: 2021-04-07 | Discharge: 2021-04-19 | DRG: 420 | Disposition: A | Discharge status: Home Health | Payer: Medicare Other | Source: Ambulatory Visit | Attending: Surgery | Admitting: Surgery

## 2021-04-07 DIAGNOSIS — D630 Anemia in neoplastic disease: Secondary | ICD-10-CM | POA: Diagnosis not present

## 2021-04-07 DIAGNOSIS — Z6821 Body mass index (BMI) 21.0-21.9, adult: Secondary | ICD-10-CM | POA: Diagnosis not present

## 2021-04-07 DIAGNOSIS — Z888 Allergy status to other drugs, medicaments and biological substances status: Secondary | ICD-10-CM

## 2021-04-07 DIAGNOSIS — M810 Age-related osteoporosis without current pathological fracture: Secondary | ICD-10-CM | POA: Diagnosis present

## 2021-04-07 DIAGNOSIS — E1165 Type 2 diabetes mellitus with hyperglycemia: Secondary | ICD-10-CM | POA: Diagnosis not present

## 2021-04-07 DIAGNOSIS — R509 Fever, unspecified: Secondary | ICD-10-CM | POA: Diagnosis not present

## 2021-04-07 DIAGNOSIS — Z880 Allergy status to penicillin: Secondary | ICD-10-CM

## 2021-04-07 DIAGNOSIS — K8689 Other specified diseases of pancreas: Secondary | ICD-10-CM

## 2021-04-07 DIAGNOSIS — R933 Abnormal findings on diagnostic imaging of other parts of digestive tract: Secondary | ICD-10-CM | POA: Diagnosis not present

## 2021-04-07 DIAGNOSIS — Z20822 Contact with and (suspected) exposure to covid-19: Secondary | ICD-10-CM | POA: Diagnosis not present

## 2021-04-07 DIAGNOSIS — E871 Hypo-osmolality and hyponatremia: Secondary | ICD-10-CM | POA: Diagnosis not present

## 2021-04-07 DIAGNOSIS — Z931 Gastrostomy status: Secondary | ICD-10-CM

## 2021-04-07 DIAGNOSIS — Z79899 Other long term (current) drug therapy: Secondary | ICD-10-CM | POA: Diagnosis not present

## 2021-04-07 DIAGNOSIS — C251 Malignant neoplasm of body of pancreas: Principal | ICD-10-CM | POA: Diagnosis present

## 2021-04-07 DIAGNOSIS — K219 Gastro-esophageal reflux disease without esophagitis: Secondary | ICD-10-CM | POA: Diagnosis present

## 2021-04-07 DIAGNOSIS — E119 Type 2 diabetes mellitus without complications: Secondary | ICD-10-CM | POA: Diagnosis present

## 2021-04-07 DIAGNOSIS — I10 Essential (primary) hypertension: Secondary | ICD-10-CM | POA: Diagnosis present

## 2021-04-07 DIAGNOSIS — N1330 Unspecified hydronephrosis: Secondary | ICD-10-CM | POA: Diagnosis present

## 2021-04-07 DIAGNOSIS — L03311 Cellulitis of abdominal wall: Secondary | ICD-10-CM | POA: Diagnosis present

## 2021-04-07 DIAGNOSIS — Z7984 Long term (current) use of oral hypoglycemic drugs: Secondary | ICD-10-CM | POA: Diagnosis not present

## 2021-04-07 DIAGNOSIS — K567 Ileus, unspecified: Secondary | ICD-10-CM | POA: Diagnosis not present

## 2021-04-07 DIAGNOSIS — N179 Acute kidney failure, unspecified: Secondary | ICD-10-CM | POA: Diagnosis not present

## 2021-04-07 DIAGNOSIS — E46 Unspecified protein-calorie malnutrition: Secondary | ICD-10-CM | POA: Diagnosis not present

## 2021-04-07 DIAGNOSIS — E860 Dehydration: Secondary | ICD-10-CM | POA: Diagnosis present

## 2021-04-07 DIAGNOSIS — Z881 Allergy status to other antibiotic agents status: Secondary | ICD-10-CM | POA: Diagnosis not present

## 2021-04-07 DIAGNOSIS — J90 Pleural effusion, not elsewhere classified: Secondary | ICD-10-CM | POA: Diagnosis not present

## 2021-04-07 DIAGNOSIS — Z91041 Radiographic dye allergy status: Secondary | ICD-10-CM

## 2021-04-07 DIAGNOSIS — E43 Unspecified severe protein-calorie malnutrition: Secondary | ICD-10-CM | POA: Diagnosis not present

## 2021-04-07 DIAGNOSIS — R000 Tachycardia, unspecified: Secondary | ICD-10-CM | POA: Diagnosis present

## 2021-04-07 DIAGNOSIS — I517 Cardiomegaly: Secondary | ICD-10-CM | POA: Diagnosis not present

## 2021-04-07 DIAGNOSIS — C259 Malignant neoplasm of pancreas, unspecified: Secondary | ICD-10-CM

## 2021-04-07 DIAGNOSIS — K315 Obstruction of duodenum: Secondary | ICD-10-CM | POA: Diagnosis not present

## 2021-04-07 DIAGNOSIS — Z4682 Encounter for fitting and adjustment of non-vascular catheter: Secondary | ICD-10-CM | POA: Diagnosis not present

## 2021-04-07 DIAGNOSIS — K3189 Other diseases of stomach and duodenum: Secondary | ICD-10-CM | POA: Diagnosis not present

## 2021-04-07 LAB — CBC
HCT: 36.7 % (ref 36.0–46.0)
Hemoglobin: 12.5 g/dL (ref 12.0–15.0)
MCH: 29.5 pg (ref 26.0–34.0)
MCHC: 34.1 g/dL (ref 30.0–36.0)
MCV: 86.6 fL (ref 80.0–100.0)
Platelets: 319 10*3/uL (ref 150–400)
RBC: 4.24 MIL/uL (ref 3.87–5.11)
RDW: 12.9 % (ref 11.5–15.5)
WBC: 7.6 10*3/uL (ref 4.0–10.5)
nRBC: 0 % (ref 0.0–0.2)

## 2021-04-07 LAB — PROTIME-INR
INR: 1 (ref 0.8–1.2)
Prothrombin Time: 12.8 seconds (ref 11.4–15.2)

## 2021-04-07 LAB — COMPREHENSIVE METABOLIC PANEL
ALT: 14 U/L (ref 0–44)
AST: 21 U/L (ref 15–41)
Albumin: 4.4 g/dL (ref 3.5–5.0)
Alkaline Phosphatase: 103 U/L (ref 38–126)
Anion gap: 13 (ref 5–15)
BUN: 21 mg/dL (ref 8–23)
CO2: 25 mmol/L (ref 22–32)
Calcium: 10.1 mg/dL (ref 8.9–10.3)
Chloride: 90 mmol/L — ABNORMAL LOW (ref 98–111)
Creatinine, Ser: 1.73 mg/dL — ABNORMAL HIGH (ref 0.44–1.00)
GFR, Estimated: 31 mL/min — ABNORMAL LOW (ref >60)
Glucose, Bld: 86 mg/dL (ref 70–99)
Potassium: 4.7 mmol/L (ref 3.5–5.1)
Sodium: 128 mmol/L — ABNORMAL LOW (ref 135–145)
Total Bilirubin: 0.8 mg/dL (ref 0.3–1.2)
Total Protein: 7.2 g/dL (ref 6.5–8.1)

## 2021-04-07 LAB — PREALBUMIN: Prealbumin: 28 mg/dL (ref 18–38)

## 2021-04-07 LAB — GLUCOSE, CAPILLARY: Glucose-Capillary: 73 mg/dL (ref 70–99)

## 2021-04-07 MED ORDER — PANTOPRAZOLE SODIUM 40 MG IV SOLR
40.0000 mg | INTRAVENOUS | Status: DC
  Administered 2021-04-07: 40 mg via INTRAVENOUS
  Filled 2021-04-07: qty 40

## 2021-04-07 MED ORDER — METOPROLOL TARTRATE 5 MG/5ML IV SOLN: 5.0000 mg | Freq: Four times a day (QID) | INTRAVENOUS | Status: DC | PRN

## 2021-04-07 MED ORDER — ONDANSETRON 4 MG PO TBDP: 4.0000 mg | ORAL_TABLET | Freq: Four times a day (QID) | ORAL | Status: DC | PRN

## 2021-04-07 MED ORDER — MORPHINE SULFATE (PF) 2 MG/ML IV SOLN
2.0000 mg | INTRAVENOUS | Status: DC | PRN
  Administered 2021-04-07 – 2021-04-08 (×3): 2 mg via INTRAVENOUS
  Filled 2021-04-07 (×4): qty 1

## 2021-04-07 MED ORDER — SODIUM CHLORIDE 0.9% FLUSH
10.0000 mL | INTRAVENOUS | Status: DC | PRN
  Administered 2021-04-14: 40 mL
  Administered 2021-04-16: 10 mL

## 2021-04-07 MED ORDER — SODIUM CHLORIDE 0.9% FLUSH
10.0000 mL | Freq: Two times a day (BID) | INTRAVENOUS | Status: DC
  Administered 2021-04-07 – 2021-04-16 (×14): 10 mL
  Administered 2021-04-16: 20 mL
  Administered 2021-04-17 – 2021-04-19 (×5): 10 mL

## 2021-04-07 MED ORDER — CHLORHEXIDINE GLUCONATE CLOTH 2 % EX PADS
6.0000 | MEDICATED_PAD | Freq: Every day | CUTANEOUS | Status: DC
  Administered 2021-04-07 – 2021-04-19 (×13): 6 via TOPICAL

## 2021-04-07 MED ORDER — ONDANSETRON HCL 4 MG/2ML IJ SOLN
4.0000 mg | Freq: Four times a day (QID) | INTRAMUSCULAR | Status: DC | PRN
  Administered 2021-04-11 – 2021-04-18 (×7): 4 mg via INTRAVENOUS
  Filled 2021-04-07 (×7): qty 2

## 2021-04-07 MED ORDER — ZOLPIDEM TARTRATE 5 MG PO TABS
5.0000 mg | ORAL_TABLET | Freq: Once | ORAL | Status: AC
  Administered 2021-04-07: 5 mg via ORAL
  Filled 2021-04-07: qty 1

## 2021-04-07 MED ORDER — KCL IN DEXTROSE-NACL 20-5-0.45 MEQ/L-%-% IV SOLN
INTRAVENOUS | Status: DC
  Filled 2021-04-07 (×2): qty 1000

## 2021-04-07 NOTE — H&P (Signed)
Leah Pruitt 23-Jan-1946  [id number].     Chief Complaint/Reason for Consult: pancreatic mass, duodenal obstruction  HPI:  Leah Pruitt is a 76 y.o. with duodenal obstruction secondary to a pancreatic mass. She was recently admitted in late December with nausea and vomiting. A CT scan showed a large pancreatic mass in the body of the pancreas, causing obstruction of the distal duodenum. It also appeared to involve the left UPJ. An NG tube was placed. GI was planning for an EUS during her admission, however the patient left AMA. She recently saw medical oncology as an outpatient and then saw me in clinic yesterday. At that time she was having continued bilious vomiting after any PO intake, and reported weakness and LUQ pain. It has been more than a week since she was able to eat a meal. After a long discussion in clinic, inpatient admission was planned for TPN, surgery to bypass her duodenal obstruction, and GI consult for EUS.  ROS: Review of Systems  Constitutional:  Positive for malaise/fatigue and weight loss. Negative for chills and fever.  Respiratory:  Negative for shortness of breath and stridor.   Gastrointestinal:  Positive for abdominal pain, nausea and vomiting.  Neurological:  Positive for weakness. Negative for seizures.   Family History  Problem Relation Age of Onset   Stroke Mother 45       deceased   Hypertension Mother    Diabetes Mellitus I Mother    Lung cancer Father 5       deceased   Hypertension Sister    Uterine cancer Sister    Stomach cancer Brother     Past Medical History:  Diagnosis Date   Appendicitis, acute 11/19/2012   Arrhythmia    Arthritis    Back pain    Cardiac arrhythmia    Colles' fracture of right radius, init for clos fx 07/09/2017   Diabetes ([place])    Diverticulosis    FH: cholecystectomy    Gastroesophageal reflux disease    GERD (gastroesophageal reflux disease) 11/19/2012   Headache    HTN (hypertension)    Lipoma     Scalp   Migraines    Osteoporosis    Pain in right wrist 07/09/2017   Unspecified essential hypertension 11/19/2012    Past Surgical History:  Procedure Laterality Date   CESAREAN SECTION     CHOLECYSTECTOMY     LAPAROSCOPIC APPENDECTOMY N/A 11/16/2012   Procedure: APPENDECTOMY LAPAROSCOPIC;  Surgeon: Imogene Burn. Georgette Dover, MD;  Location: [place];  Service: General;  Laterality: N/A;   LAPAROSCOPIC LYSIS OF ADHESIONS Bilateral 11/16/2012   Procedure: LAPAROSCOPIC LYSIS OF ADHESIONS;  Surgeon: Imogene Burn. Georgette Dover, MD;  Location: [place];  Service: General;  Laterality: Bilateral;   TONSILLECTOMY      Social History:  reports that she has never smoked. She has never used smokeless tobacco. She reports that she does not drink alcohol and does not use drugs.  Allergies:  Allergies  Allergen Reactions   Naratriptan Anaphylaxis    Rash, difficult time breathing and swallowing   Penicillins Other (See Comments)    Numbness of mouth and dry mouth   Iodine Swelling   Ciprofloxacin     Other reaction(s): Other (See Comments) Tendon pain   Penicillin G Rash    Medications Prior to Admission  Medication Sig Dispense Refill   BENICAR 20 MG tablet Take 20 mg by mouth 2 (two) times daily.     Calcium Carbonate-Vitamin D 600-200 MG-UNIT TABS Take 1 tablet  by mouth 2 (two) times daily with a meal.     dicyclomine (BENTYL) 20 MG tablet Take 20 mg by mouth daily as needed for spasms.     diltiazem (TIAZAC) 120 MG 24 hr capsule Do not take till you talk with your Medical doctor about her blood pressure (Patient taking differently: Take 120 mg by mouth daily.)     furosemide (LASIX) 20 MG tablet Take 20 mg by mouth daily.  4   GLUMETZA 500 MG 24 hr tablet Take 500 mg by mouth 3 (three) times daily.     magnesium hydroxide (MILK OF MAGNESIA) 800 MG/5ML suspension Take 30 mLs by mouth daily.     meclizine (ANTIVERT) 25 MG tablet Take 25 mg by mouth daily as needed for dizziness.     MOVANTIK 12.5 MG TABS  tablet Take 12.5 mg by mouth daily as needed (constipation).     nitrofurantoin, macrocrystal-monohydrate, (MACROBID) 100 MG capsule Take 100 mg by mouth daily. continuous     nitroGLYCERIN (NITROSTAT) 0.4 MG SL tablet Place 0.4 mg under the tongue every 5 (five) minutes as needed for chest pain.     oxyCODONE (OXY IR/ROXICODONE) 5 MG immediate release tablet Take 5 mg by mouth every 8 (eight) hours as needed for severe pain.     pantoprazole (PROTONIX) 40 MG tablet Take 1 tablet (40 mg total) by mouth 2 (two) times daily. 120 tablet 1   prochlorperazine (COMPAZINE) 10 MG tablet Take 1 tablet (10 mg total) by mouth every 6 (six) hours as needed for nausea or vomiting. 30 tablet 0   RABEprazole (ACIPHEX) 20 MG tablet Take 20 mg by mouth 2 (two) times daily.     sucralfate (CARAFATE) 1 GM/10ML suspension Take 1 g by mouth 2 (two) times daily.     traMADol (ULTRAM) 50 MG tablet Take 1-2 tablets (50-100 mg total) by mouth 3 (three) times daily as needed. (Patient taking differently: Take 50-100 mg by mouth 3 (three) times daily as needed for moderate pain.) 50 tablet 0     Physical Exam: Blood pressure 119/84, pulse (!) 108, temperature 98 F (36.7 C), temperature source Oral, resp. rate 20, height 5\' 1"  (1.549 m), weight 51.9 kg, SpO2 100 %. General: resting comfortably, appears stated age, no apparent distress Neurological: alert and oriented, no focal deficits, cranial nerves grossly in tact HEENT: normocephalic, atraumatic, oropharynx clear, no scleral icterus CV:  extremities warm and well-perfused Respiratory: normal work of breathing on room air, symmetric chest wall expansion Abdomen: soft, nondistended, nontender to palpation. No masses or organomegaly. Extremities: warm and well-perfused, no deformities, moving all extremities spontaneously Psychiatric: normal mood and affect Skin: warm and dry, no jaundice, no rashes or lesions   Results for orders placed or performed during the  hospital encounter of 04/07/21 (from the past 48 hour(s))  CBC     Status: None   Collection Time: 04/07/21  2:12 PM  Result Value Ref Range   WBC 7.6 4.0 - 10.5 K/uL   RBC 4.24 3.87 - 5.11 MIL/uL   Hemoglobin 12.5 12.0 - 15.0 g/dL   HCT 36.7 36.0 - 46.0 %   MCV 86.6 80.0 - 100.0 fL   MCH 29.5 26.0 - 34.0 pg   MCHC 34.1 30.0 - 36.0 g/dL   RDW 12.9 11.5 - 15.5 %   Platelets 319 150 - 400 K/uL   nRBC 0.0 0.0 - 0.2 %    Comment: Performed at [facility], [place] [address],  [place], [place] [address]   [place] EKG SITE RITE  Result Date: 04/07/2021 If Site Rite image not attached, placement could not be confirmed due to current cardiac rhythm.     Assessment/Plan 76 yo female with malnutrition, weight loss, and distal duodenal obstruction secondary to a large mass in the body of the pancreas. This is almost certainly a malignancy but has not yet been biopsied. As the patient has had obstructive symptoms for many weeks with ongoing weight loss and malnutrition, I do not think she is a candidate for up front resection, which would require a duodenal resection in addition to distal pancreatectomy, and possibly ureteral resection. This would carry significant risk of morbidity especially in a frail, malnourished patient. Thus I recommended EUS to obtain a tissue diagnosis, as well as a gastrojejunal bypass. I also had a discussion about placement of a feeding jejunostomy tube with the patient, but she has not yet agreed to this. - NPO except ice chips and sips - IV fluid hydration - Recommended NG tube placement but patient refused. - Place PICC line, TPN to begin tomorrow - Nutrition labs pending - GI consulted for EUS, will discuss timing and availability. - Tentatively plan for surgery tomorrow for a GJ bypass. If EUS can be done first, will defer surgery until next week. - VTE: SCDs, ambulate - Dispo: admit to inpatient   Michaelle Birks, [facility],  Hepatobiliary and Pancreatic Surgery 04/07/21 2:57 PM

## 2021-04-07 NOTE — TOC Progression Note (Signed)
Transition of Care [facility]) - Progression Note    Patient Details  Name: Leah Pruitt MRN: [id number] Date of Birth: Jan 28, 1946  Transition of Care [facility]) CM/SW Contact  Jacalyn Lefevre Edson Snowball, RN Phone Number: 04/07/2021, 4:08 PM  Clinical Narrative:      From home with husband   Admission for  TPN, surgery to bypass her duodenal obstruction, and GI consult for EUS.   Transition of Care (TOC) Screening Note   Patient Details  Name: [name] Date of Birth: [birth year]   Transition of Care Department [facility]) has reviewed patient and no TOC needs have been identified at this time. We will continue to monitor patient advancement through interdisciplinary progression rounds. If new patient transition needs arise, please place a TOC consult.        Expected Discharge Plan and Services                                                 Social Determinants of Health (SDOH) Interventions    Readmission Risk Interventions No flowsheet data found.

## 2021-04-07 NOTE — Progress Notes (Signed)
Peripherally Inserted Central Catheter Placement  The IV Nurse has discussed with the patient and/or persons authorized to consent for the patient, the purpose of this procedure and the potential benefits and risks involved with this procedure.  The benefits include less needle sticks, lab draws from the catheter, and the patient may be discharged home with the catheter. Risks include, but not limited to, infection, bleeding, blood clot (thrombus formation), and puncture of an artery; nerve damage and irregular heartbeat and possibility to perform a PICC exchange if needed/ordered by physician.  Alternatives to this procedure were also discussed.  Bard Power PICC patient education guide, fact sheet on infection prevention and patient information card has been provided to patient /or left at bedside.    PICC Placement Documentation  PICC Double Lumen 04/07/21 PICC Right Brachial 34 cm 0 cm (Active)  Indication for Insertion or Continuance of Line Administration of hyperosmolar/irritating solutions (i.e. TPN, Vancomycin, etc.) 04/07/21 1522  Exposed Catheter (cm) 0 cm 04/07/21 1522  Site Assessment Clean;Dry;Intact 04/07/21 1522  Lumen #1 Status Flushed;Blood return noted;Saline locked 04/07/21 1522  Lumen #2 Status Flushed;Blood return noted;Saline locked 04/07/21 1522  Dressing Type Transparent 04/07/21 1522  Dressing Status Clean;Dry;Intact 04/07/21 1522  Antimicrobial disc in place? Yes 04/07/21 1522  Dressing Change Due 04/14/21 04/07/21 1522       Scotty Court 04/07/2021, 3:24 PM

## 2021-04-07 NOTE — Progress Notes (Signed)
PHARMACY - TOTAL PARENTERAL NUTRITION CONSULT NOTE  Indication: Duodenal obstruction / SBO  Patient Measurements: Height: 5\' 1"  (154.9 cm) Weight: 51.9 kg (114 lb 6.4 oz) IBW/kg (Calculated) : 47.8 TPN AdjBW (KG): 51.9 Body mass index is 21.62 kg/m.  Assessment:  22 YOM admitted in Dec 2023 for management of pancreatic mass; patient declined TPN during that admission and left AMA.  PMH significant for HTN, GERD, DM and arrhythmias.  Patient has reduced intake due to nausea and vomiting and has been losing weight since November 2022.  Admit H&P pending.  Glucose / Insulin: hx DM  Electrolytes: 1/5 labs - all WNL except low Na Renal: 1/5 labs - SCr 1.2 Hepatic: LFTs WNL, prealbumin slightly low at 16.6 on 12/29 Intake / Output; MIVF: N/A GI Imaging: N/A GI Surgeries / Procedures:  Scheduled to have pancreatic biopsy  Central access: pending - PICC order placed 1/12 TPN start date: 04/08/21  Nutritional Goals:  RD Assessment:  pending    Current Nutrition:  NPO  Plan:  Start TPN 1/13 as consult received after hours Standard TPN labs and nursing care orders D51/2NS 20K at 100/hr per MD  Leah Pruitt D. Mina Marble, PharmD, BCPS, [place] 04/07/2021, 2:17 PM

## 2021-04-08 ENCOUNTER — Encounter (HOSPITAL_COMMUNITY)
Admission: AD | Disposition: A | Discharge status: Home Health | Payer: Self-pay | Source: Ambulatory Visit | Attending: Surgery

## 2021-04-08 ENCOUNTER — Inpatient Hospital Stay (HOSPITAL_COMMUNITY): Payer: Medicare Other | Admitting: Anesthesiology

## 2021-04-08 ENCOUNTER — Encounter (HOSPITAL_COMMUNITY): Payer: Self-pay | Admitting: Surgery

## 2021-04-08 DIAGNOSIS — E43 Unspecified severe protein-calorie malnutrition: Secondary | ICD-10-CM | POA: Insufficient documentation

## 2021-04-08 HISTORY — PX: GASTROJEJUNOSTOMY: SHX1697

## 2021-04-08 LAB — COMPREHENSIVE METABOLIC PANEL
ALT: 13 U/L (ref 0–44)
AST: 17 U/L (ref 15–41)
Albumin: 3.7 g/dL (ref 3.5–5.0)
Alkaline Phosphatase: 84 U/L (ref 38–126)
Anion gap: 6 (ref 5–15)
BUN: 19 mg/dL (ref 8–23)
CO2: 24 mmol/L (ref 22–32)
Calcium: 8.8 mg/dL — ABNORMAL LOW (ref 8.9–10.3)
Chloride: 97 mmol/L — ABNORMAL LOW (ref 98–111)
Creatinine, Ser: 1.4 mg/dL — ABNORMAL HIGH (ref 0.44–1.00)
GFR, Estimated: 39 mL/min — ABNORMAL LOW (ref >60)
Glucose, Bld: 129 mg/dL — ABNORMAL HIGH (ref 70–99)
Potassium: 4.4 mmol/L (ref 3.5–5.1)
Sodium: 127 mmol/L — ABNORMAL LOW (ref 135–145)
Total Bilirubin: 0.6 mg/dL (ref 0.3–1.2)
Total Protein: 6 g/dL — ABNORMAL LOW (ref 6.5–8.1)

## 2021-04-08 LAB — RESP PANEL BY RT-PCR (FLU A&B, COVID) ARPGX2
Influenza A by PCR: NEGATIVE
Influenza B by PCR: NEGATIVE
SARS Coronavirus 2 by RT PCR: NEGATIVE

## 2021-04-08 LAB — PHOSPHORUS: Phosphorus: 3.9 mg/dL (ref 2.5–4.6)

## 2021-04-08 LAB — TRIGLYCERIDES: Triglycerides: 66 mg/dL (ref <150)

## 2021-04-08 LAB — GLUCOSE, CAPILLARY
Glucose-Capillary: 99 mg/dL (ref 70–99)
Glucose-Capillary: 196 mg/dL — ABNORMAL HIGH (ref 70–99)

## 2021-04-08 LAB — MAGNESIUM: Magnesium: 1.7 mg/dL (ref 1.7–2.4)

## 2021-04-08 SURGERY — GASTROJEJUNOSTOMY
Anesthesia: General | Site: Abdomen

## 2021-04-08 MED ORDER — SODIUM CHLORIDE 0.9 % IV SOLN: INTRAVENOUS | Status: AC

## 2021-04-08 MED ORDER — SUCCINYLCHOLINE CHLORIDE 200 MG/10ML IV SOSY
PREFILLED_SYRINGE | INTRAVENOUS | Status: DC | PRN
  Administered 2021-04-08: 100 mg via INTRAVENOUS

## 2021-04-08 MED ORDER — SODIUM CHLORIDE 0.9 % IV SOLN: INTRAVENOUS | Status: DC

## 2021-04-08 MED ORDER — ROCURONIUM BROMIDE 10 MG/ML (PF) SYRINGE
PREFILLED_SYRINGE | INTRAVENOUS | Status: DC | PRN
Start: 2021-04-08 — End: 2021-04-08
  Administered 2021-04-08: 20 mg via INTRAVENOUS
  Administered 2021-04-08: 50 mg via INTRAVENOUS

## 2021-04-08 MED ORDER — ALBUMIN HUMAN 5 % IV SOLN: INTRAVENOUS | Status: DC | PRN

## 2021-04-08 MED ORDER — HYDROMORPHONE HCL 1 MG/ML IJ SOLN
INTRAMUSCULAR | Status: AC
  Filled 2021-04-08: qty 1

## 2021-04-08 MED ORDER — SODIUM CHLORIDE 0.9 % IV SOLN: INTRAVENOUS | Status: DC | PRN

## 2021-04-08 MED ORDER — HYDROMORPHONE HCL 1 MG/ML IJ SOLN: 0.2000 mg | INTRAMUSCULAR | Status: DC | PRN

## 2021-04-08 MED ORDER — LACTATED RINGERS IV SOLN: INTRAVENOUS | Status: DC

## 2021-04-08 MED ORDER — ORAL CARE MOUTH RINSE: 15.0000 mL | Freq: Once | OROMUCOSAL | Status: AC

## 2021-04-08 MED ORDER — PROPOFOL 10 MG/ML IV BOLUS
INTRAVENOUS | Status: DC | PRN
  Administered 2021-04-08: 130 mg via INTRAVENOUS

## 2021-04-08 MED ORDER — SUCCINYLCHOLINE CHLORIDE 200 MG/10ML IV SOSY
PREFILLED_SYRINGE | INTRAVENOUS | Status: AC
  Filled 2021-04-08: qty 10

## 2021-04-08 MED ORDER — PHENYLEPHRINE 40 MCG/ML (10ML) SYRINGE FOR IV PUSH (FOR BLOOD PRESSURE SUPPORT)
PREFILLED_SYRINGE | INTRAVENOUS | Status: DC | PRN
  Administered 2021-04-08 (×2): 80 ug via INTRAVENOUS

## 2021-04-08 MED ORDER — ONDANSETRON HCL 4 MG/2ML IJ SOLN
INTRAMUSCULAR | Status: AC
  Filled 2021-04-08: qty 2

## 2021-04-08 MED ORDER — ROCURONIUM BROMIDE 10 MG/ML (PF) SYRINGE
PREFILLED_SYRINGE | INTRAVENOUS | Status: AC
  Filled 2021-04-08: qty 10

## 2021-04-08 MED ORDER — FENTANYL CITRATE (PF) 100 MCG/2ML IJ SOLN
INTRAMUSCULAR | Status: AC
  Administered 2021-04-08: 50 ug
  Filled 2021-04-08: qty 2

## 2021-04-08 MED ORDER — HYDROMORPHONE HCL 1 MG/ML IJ SOLN
0.2500 mg | INTRAMUSCULAR | Status: DC | PRN
  Administered 2021-04-08 (×3): 0.25 mg via INTRAVENOUS

## 2021-04-08 MED ORDER — ARTIFICIAL TEARS OPHTHALMIC OINT
TOPICAL_OINTMENT | OPHTHALMIC | Status: AC
  Filled 2021-04-08: qty 3.5

## 2021-04-08 MED ORDER — FENTANYL CITRATE (PF) 100 MCG/2ML IJ SOLN
25.0000 ug | INTRAMUSCULAR | Status: DC | PRN
  Administered 2021-04-08 (×3): 50 ug via INTRAVENOUS

## 2021-04-08 MED ORDER — FENTANYL CITRATE (PF) 100 MCG/2ML IJ SOLN
50.0000 ug | Freq: Once | INTRAMUSCULAR | Status: DC
Start: 2021-04-08 — End: 2021-04-09

## 2021-04-08 MED ORDER — CHLORHEXIDINE GLUCONATE 0.12 % MT SOLN: 15.0000 mL | Freq: Once | OROMUCOSAL | Status: AC

## 2021-04-08 MED ORDER — INSULIN ASPART 100 UNIT/ML IJ SOLN
0.0000 [IU] | INTRAMUSCULAR | Status: DC
  Administered 2021-04-09 (×2): 2 [IU] via SUBCUTANEOUS
  Administered 2021-04-09: 3 [IU] via SUBCUTANEOUS
  Administered 2021-04-09 (×2): 2 [IU] via SUBCUTANEOUS
  Administered 2021-04-09: 3 [IU] via SUBCUTANEOUS
  Administered 2021-04-10: 2 [IU] via SUBCUTANEOUS

## 2021-04-08 MED ORDER — ACETAMINOPHEN 10 MG/ML IV SOLN
1000.0000 mg | Freq: Three times a day (TID) | INTRAVENOUS | Status: AC
  Administered 2021-04-09 (×2): 1000 mg via INTRAVENOUS
  Filled 2021-04-08 (×2): qty 100

## 2021-04-08 MED ORDER — ACETAMINOPHEN 10 MG/ML IV SOLN
INTRAVENOUS | Status: AC
  Filled 2021-04-08: qty 100

## 2021-04-08 MED ORDER — FENTANYL CITRATE (PF) 250 MCG/5ML IJ SOLN
INTRAMUSCULAR | Status: DC | PRN
  Administered 2021-04-08 (×5): 50 ug via INTRAVENOUS

## 2021-04-08 MED ORDER — ONDANSETRON HCL 4 MG/2ML IJ SOLN
INTRAMUSCULAR | Status: DC | PRN
  Administered 2021-04-08: 4 mg via INTRAVENOUS

## 2021-04-08 MED ORDER — CHLORHEXIDINE GLUCONATE 0.12 % MT SOLN
OROMUCOSAL | Status: AC
  Administered 2021-04-08: 15 mL via OROMUCOSAL
  Filled 2021-04-08: qty 15

## 2021-04-08 MED ORDER — TRAVASOL 10 % IV SOLN
INTRAVENOUS | Status: AC
  Filled 2021-04-08: qty 374.4

## 2021-04-08 MED ORDER — DEXAMETHASONE SODIUM PHOSPHATE 10 MG/ML IJ SOLN
INTRAMUSCULAR | Status: AC
  Filled 2021-04-08: qty 1

## 2021-04-08 MED ORDER — FENTANYL CITRATE (PF) 100 MCG/2ML IJ SOLN
INTRAMUSCULAR | Status: AC
  Filled 2021-04-08: qty 2

## 2021-04-08 MED ORDER — CEFAZOLIN SODIUM-DEXTROSE 2-4 GM/100ML-% IV SOLN
2.0000 g | INTRAVENOUS | Status: AC
  Administered 2021-04-08: 2 g via INTRAVENOUS
  Filled 2021-04-08: qty 100

## 2021-04-08 MED ORDER — ACETAMINOPHEN 10 MG/ML IV SOLN
INTRAVENOUS | Status: DC | PRN
Start: 2021-04-08 — End: 2021-04-08
  Administered 2021-04-08: 1000 mg via INTRAVENOUS

## 2021-04-08 MED ORDER — PROPOFOL 10 MG/ML IV BOLUS
INTRAVENOUS | Status: AC
  Filled 2021-04-08: qty 20

## 2021-04-08 MED ORDER — DEXAMETHASONE SODIUM PHOSPHATE 10 MG/ML IJ SOLN
INTRAMUSCULAR | Status: DC | PRN
  Administered 2021-04-08: 5 mg via INTRAVENOUS

## 2021-04-08 MED ORDER — OXYCODONE HCL 5 MG PO TABS
5.0000 mg | ORAL_TABLET | ORAL | Status: DC | PRN
  Filled 2021-04-08: qty 1

## 2021-04-08 MED ORDER — PANTOPRAZOLE SODIUM 40 MG IV SOLR
40.0000 mg | Freq: Two times a day (BID) | INTRAVENOUS | Status: DC
  Administered 2021-04-08 – 2021-04-19 (×23): 40 mg via INTRAVENOUS
  Filled 2021-04-08 (×23): qty 40

## 2021-04-08 MED ORDER — SUGAMMADEX SODIUM 200 MG/2ML IV SOLN
INTRAVENOUS | Status: DC | PRN
  Administered 2021-04-08: 200 mg via INTRAVENOUS

## 2021-04-08 MED ORDER — FENTANYL CITRATE (PF) 250 MCG/5ML IJ SOLN
INTRAMUSCULAR | Status: AC
  Filled 2021-04-08: qty 5

## 2021-04-08 MED ORDER — 0.9 % SODIUM CHLORIDE (POUR BTL) OPTIME
TOPICAL | Status: DC | PRN
  Administered 2021-04-08 (×2): 1000 mL

## 2021-04-08 MED ORDER — ACETAMINOPHEN 325 MG PO TABS: 650.0000 mg | ORAL_TABLET | Freq: Once | ORAL | Status: DC

## 2021-04-08 MED ORDER — ONDANSETRON HCL 4 MG/2ML IJ SOLN
4.0000 mg | Freq: Once | INTRAMUSCULAR | Status: AC | PRN
  Administered 2021-04-08: 4 mg via INTRAVENOUS

## 2021-04-08 MED ORDER — CHLORHEXIDINE GLUCONATE 0.12 % MT SOLN: 15.0000 mL | Freq: Once | OROMUCOSAL | Status: DC

## 2021-04-08 MED ORDER — HYDROMORPHONE HCL 1 MG/ML IJ SOLN
0.5000 mg | INTRAMUSCULAR | Status: DC | PRN
  Administered 2021-04-08 – 2021-04-09 (×6): 0.5 mg via INTRAVENOUS
  Filled 2021-04-08 (×6): qty 0.5

## 2021-04-08 MED ORDER — ORAL CARE MOUTH RINSE: 15.0000 mL | Freq: Once | OROMUCOSAL | Status: DC

## 2021-04-08 MED ORDER — METRONIDAZOLE 500 MG/100ML IV SOLN
500.0000 mg | INTRAVENOUS | Status: AC
Start: 2021-04-08 — End: 2021-04-08
  Administered 2021-04-08: 500 mg via INTRAVENOUS
  Filled 2021-04-08: qty 100

## 2021-04-08 SURGICAL SUPPLY — 58 items
ADH SKN CLS APL DERMABOND .7 (GAUZE/BANDAGES/DRESSINGS) ×1
APL PRP STRL LF DISP 70% ISPRP (MISCELLANEOUS) ×1
BAG DRN RND TRDRP ANRFLXCHMBR (UROLOGICAL SUPPLIES) ×1
BAG URINE DRAIN 2000ML AR STRL (UROLOGICAL SUPPLIES) ×2 IMPLANT
CANISTER SUCT 3000ML PPV (MISCELLANEOUS) ×3 IMPLANT
CATH GJ 20FR (CATHETERS) ×2 IMPLANT
CELLS DAT CNTRL 66122 CELL SVR (MISCELLANEOUS) ×1 IMPLANT
CHLORAPREP W/TINT 26 (MISCELLANEOUS) ×3 IMPLANT
CLIP VESOCCLUDE SM WIDE 24/CT (CLIP) ×1 IMPLANT
CONT SPEC 4OZ CLIKSEAL STRL BL (MISCELLANEOUS) ×4 IMPLANT
COVER SURGICAL LIGHT HANDLE (MISCELLANEOUS) ×2 IMPLANT
DERMABOND ADVANCED (GAUZE/BANDAGES/DRESSINGS) ×2
DERMABOND ADVANCED .7 DNX12 (GAUZE/BANDAGES/DRESSINGS) IMPLANT
DRAIN CHANNEL 19F RND (DRAIN) IMPLANT
DRAPE INCISE IOBAN 66X45 STRL (DRAPES) ×3 IMPLANT
DRAPE LAPAROSCOPIC ABDOMINAL (DRAPES) ×3 IMPLANT
DRAPE WARM FLUID 44X44 (DRAPES) ×3 IMPLANT
ELECT BLADE 6.5 EXT (BLADE) ×3 IMPLANT
ELECT REM PT RETURN 9FT ADLT (ELECTROSURGICAL) ×3
ELECTRODE REM PT RTRN 9FT ADLT (ELECTROSURGICAL) ×1 IMPLANT
EVACUATOR SILICONE 100CC (DRAIN) IMPLANT
GAUZE SPONGE 4X4 12PLY STRL (GAUZE/BANDAGES/DRESSINGS) ×1 IMPLANT
GLOVE SURG POLY MICRO LF SZ5.5 (GLOVE) ×3 IMPLANT
GLOVE SURG UNDER POLY LF SZ6 (GLOVE) ×3 IMPLANT
GOWN STRL REUS W/ TWL LRG LVL3 (GOWN DISPOSABLE) ×2 IMPLANT
GOWN STRL REUS W/TWL LRG LVL3 (GOWN DISPOSABLE) ×6
HANDLE SUCTION POOLE (INSTRUMENTS) ×1 IMPLANT
KIT BASIN OR (CUSTOM PROCEDURE TRAY) ×3 IMPLANT
KIT TUBE JEJUNAL 16FR (CATHETERS) IMPLANT
KIT TURNOVER KIT B (KITS) ×3 IMPLANT
LIGASURE IMPACT 36 18CM CVD LR (INSTRUMENTS) IMPLANT
NS IRRIG 1000ML POUR BTL (IV SOLUTION) ×6 IMPLANT
PACK GENERAL/GYN (CUSTOM PROCEDURE TRAY) ×3 IMPLANT
PAD ARMBOARD 7.5X6 YLW CONV (MISCELLANEOUS) ×3 IMPLANT
RELOAD PROXIMATE 75MM BLUE (ENDOMECHANICALS) ×3 IMPLANT
RELOAD STAPLE 75 3.8 BLU REG (ENDOMECHANICALS) IMPLANT
RETRACTOR WND ALEXIS 18 MED (MISCELLANEOUS) IMPLANT
RTRCTR WOUND ALEXIS 18CM MED (MISCELLANEOUS) ×3
SLEEVE SUCTION CATH 165 (SLEEVE) ×3 IMPLANT
SPONGE T-LAP 18X18 ~~LOC~~+RFID (SPONGE) ×4 IMPLANT
STAPLER PROXIMATE 75MM BLUE (STAPLE) ×2 IMPLANT
STAPLER VISISTAT 35W (STAPLE) ×1 IMPLANT
SUCTION POOLE HANDLE (INSTRUMENTS) ×3
SUT ETHILON 2 0 FS 18 (SUTURE) ×2 IMPLANT
SUT PDS AB 1 TP1 96 (SUTURE) ×6 IMPLANT
SUT PDS AB 3-0 SH 27 (SUTURE) IMPLANT
SUT PROLENE 4 0 RB 1 (SUTURE)
SUT PROLENE 4-0 RB1 .5 CRCL 36 (SUTURE) ×1 IMPLANT
SUT SILK 2 0 TIES 10X30 (SUTURE) ×3 IMPLANT
SUT SILK 2 0SH CR/8 30 (SUTURE) ×3 IMPLANT
SUT SILK 3 0 TIES 10X30 (SUTURE) ×3 IMPLANT
SUT SILK 3 0SH CR/8 30 (SUTURE) ×3 IMPLANT
SUT VIC AB 3-0 MH 27 (SUTURE) IMPLANT
SUT VIC AB 3-0 SH 27 (SUTURE) ×9
SUT VIC AB 3-0 SH 27X BRD (SUTURE) ×2 IMPLANT
SYR CONTROL 10ML LL (SYRINGE) ×2 IMPLANT
TOWEL GREEN STERILE (TOWEL DISPOSABLE) ×3 IMPLANT
TOWEL GREEN STERILE FF (TOWEL DISPOSABLE) ×3 IMPLANT

## 2021-04-08 NOTE — Care Management Important Message (Signed)
Important Message  Patient Details  Name: Leah Pruitt MRN: [id number] Date of Birth: 04-27-1945   Medicare Important Message Given:  Yes     Hannah Beat 04/08/2021, 3:43 PM

## 2021-04-08 NOTE — Transfer of Care (Signed)
Immediate Anesthesia Transfer of Care Note  Patient: Leah Pruitt  Procedure(s) Performed: OPEN GASTROJEJUNOSTOMY BYPASS (Abdomen)  Patient Location: PACU  Anesthesia Type:General  Level of Consciousness: awake, alert  and oriented  Airway & Oxygen Therapy: Patient Spontanous Breathing and Patient connected to face mask oxygen  Post-op Assessment: Report given to RN and Post -op Vital signs reviewed and stable  Post vital signs: Reviewed and stable  Last Vitals:  Vitals Value Taken Time  BP    Temp    Pulse 123 04/08/21 1706  Resp 44 04/08/21 1706  SpO2 100 % 04/08/21 1706  Vitals shown include unvalidated device data.  Last Pain:  Vitals:   04/08/21 1436  TempSrc: Oral  PainSc: 0-No pain         Complications: No notable events documented.

## 2021-04-08 NOTE — Op Note (Signed)
Date: 04/08/21  Patient: Leah Pruitt MRN: [id number]  Preoperative Diagnosis: Duodenal obstruction secondary to a pancreatic mass Postoperative Diagnosis: Same  Procedure:  Exploratory laparotomy Biopsy of pancreatic mass Side-to-side antecolic gastrojejunal bypass Placement of 20-Fr feeding gastrojejunostomy tube  Surgeon: Michaelle Birks, MD Assistant: Nedra Hai, MD  EBL: Minimal  Anesthesia: General endotracheal  Specimens: Pancreatic mass (biopsy)  Indications: Leah Pruitt is a 76 yo female who presented with duodenal obstruction and persistent vomiting secondary to a large mass in the body of the pancreas, which on imaging involves the fourth portion of the duodenum and the left ureteropelvic junction. She was admitted several weeks ago and underwent NG decompression and was scheduled for an EUS, but left against medical advice prior to the procedure. She presented to clinic yesterday with ongoing signs of obstruction and was admitted. After an extensive discussion, she agreed to undergo a GJ bypass with feeding tube placement, and a possible surgical biopsy of the pancreatic mass.  Findings: Large, fixed infiltrative mass in the body of the pancreas involving the fourth portion of the duodenum.  Enlarged floppy stomach, consistent with obstruction.  Retrogastric antecolic GJ bypass was performed with placement of a 20-Fr feeding GJ tube across the anastomosis.  Procedure details: Informed consent was obtained in the preoperative area prior to the procedure. The patient was brought to the operating room and placed on the table in the supine position.  General anesthesia was induced and appropriate lines and drains were placed for intraoperative monitoring. Perioperative antibiotics were administered per SCIP guidelines. The abdomen was prepped and draped in the usual sterile fashion. A pre-procedure timeout was taken verifying patient identity, surgical site and procedure to be  performed.  A small upper midline skin incision was made, the subcutaneous tissue was divided with cautery, and the fascia was opened along the linea alba.  The peritoneum was opened, and a wound protector was placed.  The peritoneal surface was palpated and there were no nodules.  The liver was visually inspected and palpated and there was no evidence of metastatic disease.  The ligament of Treitz was identified and there was a very firm fixed mass palpable at the root of the mesentery invading the distal duodenum.  The gastrocolic omentum was opened with cautery to enter the lesser sac.  A very firm infiltrative mass was clearly visible in the body of the pancreas.  A very small sliver of tissue was sharply excised from the surface of this mass and sent for routine pathology.  The biopsy site was hemostatic.  Next the ligament of Treitz was again identified and a loop of jejunum approximately 40 cm distal to the ligament of Treitz was brought up to the stomach.  A gastrotomy was made on the posterior surface of the stomach on the body, and an enterotomy was made on the adjacent jejunum.  The side-to-side gastrojejunostomy was created with a 75 mm GIA stapler with a blue load.  Next a 24 [place] feeding gastrojejunostomy tube was brought onto the field.  A small incision was made in the left upper quadrant abdominal wall and the end of the feeding tube was passed through the abdominal wall into the abdomen.  A gastrotomy was made on the anterior surface of the stomach on the body near the greater curve.  The end of the feeding tube was passed into the stomach.  A guidewire was then advanced into the jejunal limb of the feeding tube, and the jejunal limb was fed across the [place]  anastomosis, using the common enterotomy to ensure that the tube was advanced down into the efferent limb.  Once the tube was milked distally the guidewire was removed.  The balloon was then inflated in the stomach with 5 mL of sterile water.   The common enterotomy on the [place] anastomosis was then closed with a running 3-0 Vicryl suture, and oversewn with 3-0 silk Lembert sutures.  The GJ anastomosis was palpated and was widely patent, and the feeding tube was appropriately positioned across the anastomosis with the jejunal tube in the efferent limb.  The stomach was then tacked up to the abdominal wall around the feeding tube using 2-0 silk sutures.  The wound protector was removed.  The fascia was closed at midline with a running looped 1 PDS suture.  Scarpa's layer was closed with a running 3-0 Vicryl suture and the skin was closed with a running subcuticular 4-0 Monocryl suture.  Dermabond was applied.  The patient tolerated the procedure with no apparent complications.  All counts were correct x2 at the end of the procedure. The patient was extubated and taken to PACU in stable condition.  Michaelle Birks, MD 04/08/21 5:14 PM

## 2021-04-08 NOTE — Progress Notes (Signed)
Subjective: No acute changes. Reports abdominal pain, no vomiting. Creatinine elevated at 1.7 on admission labs, downtrending to 1.4 this morning.   Objective: Vital signs in last 24 hours: Temp:  [98 F (36.7 C)-98.5 F (36.9 C)] 98.1 F (36.7 C) (01/13 1057) Pulse Rate:  [88-108] 89 (01/13 1057) Resp:  [16-20] 16 (01/13 1057) BP: (99-119)/(66-84) 107/67 (01/13 1057) SpO2:  [97 %-100 %] 100 % (01/13 1057) Weight:  [51.9 kg] 51.9 kg (01/12 1403) Last BM Date: 03/22/21  Intake/Output from previous day: No intake/output data recorded. Intake/Output this shift: No intake/output data recorded.  PE: General: resting comfortably, NAD Neuro: alert and oriented, no focal deficits Resp: normal work of breathing on room air Abdomen: soft, nondistended, minimally tender to palpation. Extremities: warm and well-perfused   Lab Results:  Recent Labs    04/07/21 1412  WBC 7.6  HGB 12.5  HCT 36.7  PLT 319   BMET Recent Labs    04/07/21 1412 04/08/21 0122  NA 128* 127*  K 4.7 4.4  CL 90* 97*  CO2 25 24  GLUCOSE 86 129*  BUN 21 19  CREATININE 1.73* 1.40*  CALCIUM 10.1 8.8*   PT/INR Recent Labs    04/07/21 1412  LABPROT 12.8  INR 1.0   CMP     Component Value Date/Time   NA 127 (L) 04/08/2021 0122   NA 133 (A) 03/31/2021 0000   K 4.4 04/08/2021 0122   CL 97 (L) 04/08/2021 0122   CO2 24 04/08/2021 0122   GLUCOSE 129 (H) 04/08/2021 0122   BUN 19 04/08/2021 0122   BUN 17 03/31/2021 0000   CREATININE 1.40 (H) 04/08/2021 0122   CALCIUM 8.8 (L) 04/08/2021 0122   PROT 6.0 (L) 04/08/2021 0122   ALBUMIN 3.7 04/08/2021 0122   AST 17 04/08/2021 0122   ALT 13 04/08/2021 0122   ALKPHOS 84 04/08/2021 0122   BILITOT 0.6 04/08/2021 0122   GFRNONAA 39 (L) 04/08/2021 0122   GFRAA >60 10/11/2016 1100   Lipase     Component Value Date/Time   LIPASE 31 03/23/2021 0420       Studies/Results: [place] EKG SITE RITE  Result Date: 04/07/2021 If Site Rite image  not attached, placement could not be confirmed due to current cardiac rhythm.   Anti-infectives: Anti-infectives (From admission, onward)    Start     Dose/Rate Route Frequency Ordered Stop   04/08/21 1100  ceFAZolin (ANCEF) IVPB 2g/100 mL premix        2 g 200 mL/hr over 30 Minutes Intravenous On call to O.R. 04/08/21 1010 04/09/21 0559   04/08/21 1100  metroNIDAZOLE (FLAGYL) IVPB 500 mg        500 mg 100 mL/hr over 60 Minutes Intravenous On call to O.R. 04/08/21 1010 04/09/21 0559        Assessment/Plan 76 yo female with duodenal obstruction secondary to a large mass in the body of the pancreas. I had another discussion with the patient and her husband at bedside this afternoon regarding surgery. I recommended a GJ bypass, as well as placement of a feeding J tube as it will likely take time for the stomach to empty through the bypass. The patient is resistant to having any external lines and tubes, but I stressed the need for adequate nutrition moving forward, and discussed the enteral feeds are much preferred to TPN. She has agreed to have a feeding tube placed. Will proceed to OR this afternoon. - NPO, IV  fluid hydration, TPN to start tonight - Trend creatinine, improving with fluids, likely secondary to dehydration and not left hydronephrosis - Pain and nausea control - VTE: SCDs - Dispo: inpatient, med-surg floor     LOS: 1 day    Michaelle Birks, MD [facility] General, Hepatobiliary and Pancreatic Surgery 04/08/21 1:11 PM

## 2021-04-08 NOTE — Progress Notes (Signed)
PHARMACY - TOTAL PARENTERAL NUTRITION CONSULT NOTE  Indication: Duodenal obstruction / SBO  Patient Measurements: Height: 5\' 1"  (154.9 cm) Weight: 51.9 kg (114 lb 6.4 oz) IBW/kg (Calculated) : 47.8 TPN AdjBW (KG): 51.9 Body mass index is 21.62 kg/m.  Assessment:  77 YOM admitted in Dec 2023 for management of pancreatic mass; patient declined TPN during that admission and left AMA.  She is admitted 1/12 for duodenal obstruction secondary to a pancreatic mass. PMH significant for HTN, GERD, DM and arrhythmias.  Patient has reduced intake due to nausea and vomiting and has been losing weight since November 2022 (~11 lb loss).  It has been >7 days since she was able to eat a meal. She is at high risk for refeeding syndrome. Pharmacy consulted to manage TPN.   Glucose / Insulin: hx DM, A1c 6.4 Electrolytes: Na 127, K 4.4, Mg 1.7, Phos 3.9, low CL, CO2 WNL Renal: AKI, SCr down 1.4 (baseline ~1) Hepatic: LFTs / Tbili / TG WNL, prealbumin 28, albumin 3.7 Intake / Output; MIVF: 1 urine occurrence charted, LR at 100 ml/hr, refused NGT placement GI Imaging: N/A GI Surgeries / Procedures:  Scheduled to have pancreatic biopsy  Central access: double lumen PICC 04/07/21 TPN start date: 04/08/21  Nutritional Goals:  RD Assessment:  Estimated Needs Total Energy Estimated Needs: 1600-1800 Total Protein Estimated Needs: 80-95 grams Total Fluid Estimated Needs: >/= 1.6 L Goal rate 70 ml/hr (provides 87 g protein and 1724 kcal)  Current Nutrition:  NPO and TPN  Plan:  Start TPN at 76mL/hr at 1800 (provides 37 g protein and 738 kcal) Electrolytes in TPN: Na 88mEq/L, K 69mEq/L, Ca 89mEq/L, Mg 76mEq/L, and Phos 85mmol/L. Cl:Ac 1:1 Add standard MVI and trace elements to TPN Initiate Sensitive q4h SSI and adjust as needed  Reduce MIVF to 70 mL/hr at 1800 Monitor TPN labs on Mon/Thurs, in am  Thank you for involving pharmacy in this patient's care.  Renold Genta, PharmD, BCPS Clinical  Pharmacist Clinical phone for 04/08/2021 until 3p is [phone] 04/08/2021 10:29 AM  **Pharmacist phone directory can be found on [email or web address] listed under [place]**

## 2021-04-08 NOTE — Anesthesia Postprocedure Evaluation (Signed)
Anesthesia Post Note  Patient: Psychologist, clinical  Procedure(s) Performed: OPEN GASTROJEJUNOSTOMY BYPASS (Abdomen)     Patient location during evaluation: PACU Anesthesia Type: General Level of consciousness: awake and alert, oriented and patient cooperative Pain management: pain level controlled Vital Signs Assessment: post-procedure vital signs reviewed and stable Respiratory status: spontaneous breathing, nonlabored ventilation and respiratory function stable Cardiovascular status: blood pressure returned to baseline and stable Postop Assessment: no apparent nausea or vomiting Anesthetic complications: no   No notable events documented.  Last Vitals:  Vitals:   04/08/21 1735 04/08/21 1750  BP: (!) 132/91 (!) 158/88  Pulse: (!) 112 (!) 110  Resp: 16 (!) 25  Temp:    SpO2: 100% 100%    Last Pain:                 Leah Pruitt

## 2021-04-08 NOTE — Progress Notes (Signed)
Initial Nutrition Assessment  DOCUMENTATION CODES:   Severe malnutrition in context of acute illness/injury  INTERVENTION:   TPN management per Pharmacy  Tube feeds via J-Tube: Start Osmolite 1.5 @ 25 mL/hr and advance by 10 mL until goal of 45 mL/hr (1080 mL/day)  45 mL ProSource TF - BID 150 mL free water flushes q4h  Provides 1700 kcal, 90 gm PRO, 823 mL free water (1723 mL total free water) daily.  Monitor magnesium, potassium, and phosphorus BID for at least 3 days, MD to replete as needed, as pt is at risk for refeeding syndrome given poor oral intake and malnutirtion.  NUTRITION DIAGNOSIS:   Severe Malnutrition related to acute illness (pancreatic mass) as evidenced by mild fat depletion, moderate muscle depletion, percent weight loss.  GOAL:   Patient will meet greater than or equal to 90% of their needs  MONITOR:   Labs, Weight trends, I & O's  REASON FOR ASSESSMENT:   Consult New TPN/TNA  ASSESSMENT:   76 y.o. female presented with a duodenal obstruction due to pancreatic mass. Recently admitted in December and left AMA. PMH includes T2DM, HTN, and GERD. Pt admitted for EUS and gastrojejunal bypass.   Pt resting in bed, husband at bedside. Husband assist in providing history.  Pt reports that she has not had an appetite for a few months. 2 months ago she was able to eat but very little and within the past month she has not been able to eat. When she ate she would throw it up. Pt reports that they eat very healthy, her or her husband cooks. They eat fish or chicken. Pt reports that she has not had an appetite and now she does.   Pt reports that her UBW was 130# and now she weighs 111#. Reports that this weight loss has occurred within the 2 months that she has not been eating well. Per EMR, pt has had 9% weight loss within 2 months, this is clinically significant for time frame.   Discussed with pt that she will be receiving all nutrition through her IV. Discussed  that once we get the ok from surgery we can start feedings through her J-tube.   Pt and husband with no other questions at this time.  Medications reviewed and include: Protonix Labs reviewed:  - Sodium 127 - Creatinine 8.8 - Hgb A1c 6.4% (03/23/2021)   NUTRITION - FOCUSED PHYSICAL EXAM:  Flowsheet Row Most Recent Value  Orbital Region Mild depletion  Upper Arm Region No depletion  Thoracic and Lumbar Region No depletion  Buccal Region Mild depletion  Temple Region Mild depletion  Clavicle Bone Region Moderate depletion  Clavicle and Acromion Bone Region Mild depletion  Scapular Bone Region Unable to assess  Dorsal Hand Moderate depletion  Patellar Region Moderate depletion  Anterior Thigh Region Moderate depletion  Posterior Calf Region Moderate depletion  Edema (RD Assessment) None  Hair Reviewed  Eyes Reviewed  Mouth Reviewed  Skin Reviewed  Nails Reviewed       Diet Order:   Diet Order             Diet NPO time specified Except for: Ice Chips  Diet effective now                   EDUCATION NEEDS:   No education needs have been identified at this time  Skin:  Skin Assessment: Reviewed RN Assessment  Last BM:  03/22/21  Height:   Ht Readings from Last 1 Encounters:  04/07/21 5\' 1"  (1.549 m)    Weight:   Wt Readings from Last 1 Encounters:  04/07/21 51.9 kg    Ideal Body Weight:  47.7 kg  BMI:  Body mass index is 21.62 kg/m.  Estimated Nutritional Needs:   Kcal:  1600-1800  Protein:  80-95 grams  Fluid:  >/= 1.6 L    Ab Leaming Louie Casa, RD, LDN Clinical Dietitian See [facility] for contact information.

## 2021-04-08 NOTE — Anesthesia Procedure Notes (Signed)
Procedure Name: Intubation Date/Time: 04/08/2021 3:42 PM Performed by: Alain Marion, CRNA Pre-anesthesia Checklist: Patient identified, Emergency Drugs available, Suction available and Patient being monitored Patient Re-evaluated:Patient Re-evaluated prior to induction Oxygen Delivery Method: Circle System Utilized Preoxygenation: Pre-oxygenation with 100% oxygen Induction Type: IV induction and Rapid sequence Laryngoscope Size: Miller and 2 Grade View: Grade I Tube type: Oral Tube size: 7.0 mm Number of attempts: 1 Airway Equipment and Method: Stylet and Oral airway Placement Confirmation: ETT inserted through vocal cords under direct vision, positive ETCO2 and breath sounds checked- equal and bilateral Secured at: 20 cm Tube secured with: Tape Dental Injury: Teeth and Oropharynx as per pre-operative assessment

## 2021-04-08 NOTE — OR PostOp (Incomplete)
PACU TO INPATIENT HANDOFF REPORT  Name/Age/Gender Leah Pruitt 76 y.o. female  Code Status    Code Status Orders  (From admission, onward)           Start     Ordered   04/07/21 1358  Full code  Continuous        04/07/21 1357           Code Status History     Date Active Date Inactive Code Status Order ID Comments User Context   03/22/2021 2350 03/24/2021 1940 Full Code [id number]  Kristopher Oppenheim, DO Inpatient   03/22/2021 2208 03/22/2021 2350 Full Code [id number]  Kristopher Oppenheim, DO ED   08/20/2020 2119 08/22/2020 1713 Full Code [id number]  Richarda Osmond, DO ED   11/16/2012 1040 11/17/2012 1648 Full Code [id number]  Donnie Mesa, MD Inpatient   11/16/2012 0357 11/16/2012 1040 Full Code [id number]  Stark Klein, MD Inpatient       Home/SNF/Other {Discharge Destination:18313::"Home"}  Chief Complaint Pancreatic mass [K86.89]  Level of Care/Admitting Diagnosis ED Disposition     None       Medical History Past Medical History:  Diagnosis Date   Appendicitis, acute 11/19/2012   Arrhythmia    Arthritis    Back pain    Cardiac arrhythmia    Colles' fracture of right radius, init for clos fx 07/09/2017   Diabetes ([place])    Diverticulosis    FH: cholecystectomy    Gastroesophageal reflux disease    GERD (gastroesophageal reflux disease) 11/19/2012   Headache    HTN (hypertension)    Lipoma    Scalp   Migraines    Osteoporosis    Pain in right wrist 07/09/2017   Unspecified essential hypertension 11/19/2012    Allergies Allergies  Allergen Reactions   Naratriptan Anaphylaxis    Rash, difficult time breathing and swallowing   Penicillins Other (See Comments)    Numbness of mouth and dry mouth   Iodine Swelling   Ciprofloxacin     Other reaction(s): Other (See Comments) Tendon pain   Penicillin G Rash    IV Location/Drains/Wounds Patient Lines/Drains/Airways Status     Active Line/Drains/Airways     Name Placement date Placement time Site  Days   Peripheral IV 04/08/21 20 G Left Hand 04/08/21  1545  Hand  less than 1   PICC Double Lumen 04/07/21 PICC Right Brachial 34 cm 0 cm 04/07/21  1522  -- 1   Gastrostomy/Enterostomy Other (Comment) 20 Fr. LUQ 04/08/21  1610  LUQ  less than 1   Incision (Closed) 04/08/21 Abdomen Other (Comment) 04/08/21  1631  -- less than 1            Labs/Imaging Results for orders placed or performed during the hospital encounter of 04/07/21 (from the past 48 hour(s))  Comprehensive metabolic panel     Status: Abnormal   Collection Time: 04/07/21  2:12 PM  Result Value Ref Range   Sodium 128 (L) 135 - 145 mmol/L   Potassium 4.7 3.5 - 5.1 mmol/L   Chloride 90 (L) 98 - 111 mmol/L   CO2 25 22 - 32 mmol/L   Glucose, Bld 86 70 - 99 mg/dL    Comment: Glucose reference range applies only to samples taken after fasting for at least 8 hours.   BUN 21 8 - 23 mg/dL   Creatinine, Ser 1.73 (H) 0.44 - 1.00 mg/dL   Calcium 10.1 8.9 - 10.3 mg/dL   Total Protein 7.2 6.5 -  8.1 g/dL   Albumin 4.4 3.5 - 5.0 g/dL   AST 21 15 - 41 U/L   ALT 14 0 - 44 U/L   Alkaline Phosphatase 103 38 - 126 U/L   Total Bilirubin 0.8 0.3 - 1.2 mg/dL   GFR, Estimated 31 (L) >60 mL/min    Comment: (NOTE) Calculated using the CKD-EPI Creatinine Equation (2021)    Anion gap 13 5 - 15    Comment: Performed at [facility] [address] [place], [place] [address]  Protime-INR     Status: None   Collection Time: 04/07/21  2:12 PM  Result Value Ref Range   Prothrombin Time 12.8 11.4 - 15.2 seconds   INR 1.0 0.8 - 1.2    Comment: (NOTE) INR goal varies based on device and disease states. Performed at [facility], [place] [address] [place] [address]   CBC     Status: None   Collection Time: 04/07/21  2:12 PM  Result Value Ref Range   WBC 7.6 4.0 - 10.5 K/uL   RBC 4.24 3.87 - 5.11 MIL/uL   Hemoglobin 12.5 12.0 - 15.0 g/dL   HCT 36.7 36.0 - 46.0 %   MCV 86.6 80.0 - 100.0 fL   MCH 29.5 26.0 - 34.0 pg    MCHC 34.1 30.0 - 36.0 g/dL   RDW 12.9 11.5 - 15.5 %   Platelets 319 150 - 400 K/uL   nRBC 0.0 0.0 - 0.2 %    Comment: Performed at [facility], [place] [address], [place], [place] [address]  Prealbumin     Status: None   Collection Time: 04/07/21  2:12 PM  Result Value Ref Range   Prealbumin 28.0 18 - 38 mg/dL    Comment: Performed at [facility] [address] [place], [place] [address]  Glucose, capillary     Status: None   Collection Time: 04/07/21  3:54 PM  Result Value Ref Range   Glucose-Capillary 73 70 - 99 mg/dL    Comment: Glucose reference range applies only to samples taken after fasting for at least 8 hours.   Comment 1 Document in Chart   Comprehensive metabolic panel     Status: Abnormal   Collection Time: 04/08/21  1:22 AM  Result Value Ref Range   Sodium 127 (L) 135 - 145 mmol/L   Potassium 4.4 3.5 - 5.1 mmol/L   Chloride 97 (L) 98 - 111 mmol/L   CO2 24 22 - 32 mmol/L   Glucose, Bld 129 (H) 70 - 99 mg/dL    Comment: Glucose reference range applies only to samples taken after fasting for at least 8 hours.   BUN 19 8 - 23 mg/dL   Creatinine, Ser 1.40 (H) 0.44 - 1.00 mg/dL   Calcium 8.8 (L) 8.9 - 10.3 mg/dL   Total Protein 6.0 (L) 6.5 - 8.1 g/dL   Albumin 3.7 3.5 - 5.0 g/dL   AST 17 15 - 41 U/L   ALT 13 0 - 44 U/L   Alkaline Phosphatase 84 38 - 126 U/L   Total Bilirubin 0.6 0.3 - 1.2 mg/dL   GFR, Estimated 39 (L) >60 mL/min    Comment: (NOTE) Calculated using the CKD-EPI Creatinine Equation (2021)    Anion gap 6 5 - 15    Comment: Performed at [facility], [place] [address] [place], [place] [address]  Magnesium     Status: None   Collection Time:  04/08/21  1:22 AM  Result Value Ref Range   Magnesium 1.7 1.7 - 2.4 mg/dL    Comment: Performed at [facility] [address], [place], [place] [address]  Phosphorus     Status: None   Collection Time: 04/08/21  1:22 AM  Result Value Ref Range   Phosphorus 3.9 2.5 - 4.6 mg/dL     Comment: Performed at [facility] [address], [place], [place] [address]  Triglycerides     Status: None   Collection Time: 04/08/21  1:22 AM  Result Value Ref Range   Triglycerides 66 <150 mg/dL    Comment: Performed at [facility] [address], [place], [place] [address]  Resp Panel by RT-PCR (Flu A&B, Covid) Nasopharyngeal Swab     Status: None   Collection Time: 04/08/21 10:12 AM   Specimen: Nasopharyngeal Swab; Nasopharyngeal(NP) swabs in vial transport medium  Result Value Ref Range   SARS Coronavirus 2 by RT PCR NEGATIVE NEGATIVE    Comment: (NOTE) SARS-CoV-2 target nucleic acids are NOT DETECTED.  The SARS-CoV-2 RNA is generally detectable in upper respiratory specimens during the acute phase of infection. The lowest concentration of SARS-CoV-2 viral copies this assay can detect is 138 copies/mL. A negative result does not preclude SARS-Cov-2 infection and should not be used as the sole basis for treatment or other patient management decisions. A negative result may occur with  improper specimen collection/handling, submission of specimen other than nasopharyngeal swab, presence of viral mutation(s) within the areas targeted by this assay, and inadequate number of viral copies(<138 copies/mL). A negative result must be combined with clinical observations, patient history, and epidemiological information. The expected result is Negative.  Fact Sheet for Patients:  EntrepreneurPulse.com.au  Fact Sheet for Healthcare Providers:  IncredibleEmployment.be  This test is no t yet approved or cleared by the [place] FDA and  has been authorized for detection and/or diagnosis of SARS-CoV-2 by FDA under an Emergency Use Authorization (EUA). This EUA will remain  in effect (meaning this test can be used) for the duration of the COVID-19 declaration under Section 564(b)(1) of the Act, 21 U.S.C.section 360bbb-3(b)(1),  unless the authorization is terminated  or revoked sooner.       Influenza A by PCR NEGATIVE NEGATIVE   Influenza B by PCR NEGATIVE NEGATIVE    Comment: (NOTE) The Xpert Xpress SARS-CoV-2/FLU/RSV plus assay is intended as an aid in the diagnosis of influenza from Nasopharyngeal swab specimens and should not be used as a sole basis for treatment. Nasal washings and aspirates are unacceptable for Xpert Xpress SARS-CoV-2/FLU/RSV testing.  Fact Sheet for Patients: EntrepreneurPulse.com.au  Fact Sheet for Healthcare Providers: IncredibleEmployment.be  This test is not yet approved or cleared by the [place] FDA and has been authorized for detection and/or diagnosis of SARS-CoV-2 by FDA under an Emergency Use Authorization (EUA). This EUA will remain in effect (meaning this test can be used) for the duration of the COVID-19 declaration under Section 564(b)(1) of the Act, 21 U.S.C. section 360bbb-3(b)(1), unless the authorization is terminated or revoked.  Performed at [facility], [place] [address], [place], [place] [address]   Glucose, capillary     Status: None   Collection Time: 04/08/21  2:44 PM  Result Value Ref Range   Glucose-Capillary 99 70 - 99 mg/dL    Comment: Glucose reference range applies only to samples taken after fasting for at least 8 hours.   [place] EKG SITE  RITE  Result Date: 04/07/2021 If Site Rite image not attached, placement could not be confirmed due to current cardiac rhythm.   Pending Labs   Vitals/Pain Today's Vitals   04/08/21 1735 04/08/21 1750 04/08/21 1805 04/08/21 1820  BP: (!) 132/91 (!) 158/88 138/88 (!) 142/90  Pulse: (!) 112 (!) 110 (!) 102 (!) 118  Resp: 16 (!) 25 20 16   Temp:      TempSrc:      SpO2: 100% 100% 98% 100%  Weight:      Height:      PainSc: 10-Worst pain ever  8      Isolation Precautions @ISOLATION @  Administered Medications Periop Administered Meds from 04/08/2021 1420  to 04/08/2021 1903       Date/Time Order Dose Route Action Action by Comments    04/08/2021 1841 EST 0.9 %  sodium chloride infusion -- Intravenous New Bag/Given Guerrier, Lodema Hong, RN --    04/08/2021 1641 EST 0.9 %  sodium chloride infusion -- Intravenous Anesthesia Volume Adjustment Alain Marion, CRNA --    04/08/2021 1600 EST 0.9 %  sodium chloride infusion -- Intravenous New Bag/Given Alain Marion, CRNA --    04/08/2021 1529 EST 0.9 % irrigation (POUR BTL) 1,000 mL Irrigation Given Dwan Bolt, MD exp 2025    04/08/2021 1528 EST 0.9 % irrigation (POUR BTL) 1,000 mL Irrigation Given Dwan Bolt, MD exp 2025    04/08/2021 1641 EST acetaminophen (OFIRMEV) IV 1,000 mg Intravenous Given Alain Marion, CRNA --    04/08/2021 1622 EST albumin human 5 % solution 0  Intravenous Maia Petties, CRNA --    04/08/2021 1600 EST albumin human 5 % solution -- Intravenous New Bag/Given Alain Marion, CRNA --    04/08/2021 1837 EST ceFAZolin (ANCEF) IVPB 2g/100 mL premix -- Intravenous MAR Unhold Transfer Provider, Automatic --    04/08/2021 1546 EST ceFAZolin (ANCEF) IVPB 2g/100 mL premix 2 g Intravenous Given Alain Marion, CRNA --    04/08/2021 1439 EST chlorhexidine (PERIDEX) 0.12 % solution 15 mL 15 mL Mouth/Throat Given Alphonse Guild, RN --    04/08/2021 1837 EST Chlorhexidine Gluconate Cloth 2 % PADS 6 each -- Topical MAR Unhold Transfer Provider, Automatic --    04/08/2021 1547 EST dexamethasone (DECADRON) injection 5 mg Intravenous Given Alain Marion, CRNA --    04/08/2021 1740 EST fentaNYL (SUBLIMAZE) 100 MCG/2ML injection 50 mcg  Given Hubert Azure, RN --    04/08/2021 1728 EST fentaNYL (SUBLIMAZE) injection 25-50 mcg 50 mcg Intravenous Given Hubert Azure, RN --    04/08/2021 1715 EST fentaNYL (SUBLIMAZE) injection 25-50 mcg 50 mcg Intravenous Given Hubert Azure, RN --    04/08/2021 1705 EST fentaNYL (SUBLIMAZE) injection 25-50 mcg 50 mcg Intravenous Given Hubert Azure, RN --    04/08/2021 1655 EST fentaNYL citrate (PF) (SUBLIMAZE) injection 50 mcg Intravenous Given Alain Marion, CRNA --    04/08/2021 1600 EST fentaNYL citrate (PF) (SUBLIMAZE) injection 50 mcg Intravenous Given Alain Marion, CRNA --    04/08/2021 1550 EST fentaNYL citrate (PF) (SUBLIMAZE) injection 50 mcg Intravenous Given Alain Marion, CRNA --    04/08/2021 1545 EST fentaNYL citrate (PF) (SUBLIMAZE) injection 50 mcg Intravenous Given Alain Marion, CRNA --    04/08/2021 1540 EST fentaNYL citrate (PF) (SUBLIMAZE) injection 50 mcg Intravenous Given Alain Marion, CRNA --    04/08/2021 1837 EST HYDROmorphone (DILAUDID) injection 0.2 mg -- Intravenous MAR  Unhold Wellsite geologist, Automatic --    04/08/2021 1801 EST HYDROmorphone (DILAUDID) injection 0.25-0.5 mg 0.25 mg Intravenous Given Hubert Azure, RN --    04/08/2021 1752 EST HYDROmorphone (DILAUDID) injection 0.25-0.5 mg 0.25 mg Intravenous Given Hubert Azure, RN --    04/08/2021 1747 EST HYDROmorphone (DILAUDID) injection 0.25-0.5 mg 0.25 mg Intravenous Given Hubert Azure, RN --    04/08/2021 1837 EST insulin aspart (novoLOG) injection 0-9 Units -- Subcutaneous MAR Unhold Transfer Provider, Automatic --    04/08/2021 1641 EST lactated ringers infusion -- Intravenous Restarted Alain Marion, CRNA --    04/08/2021 1640 EST lactated ringers infusion -- Intravenous Trecia Rogers, CRNA Switch to gravity    04/08/2021 1530 EST lactated ringers infusion -- Intravenous Continued from Pre-op Alain Marion, CRNA --    04/08/2021 1439 EST lactated ringers infusion -- Intravenous New Bag/Given Alphonse Guild, RN --    04/08/2021 1439 EST MEDLINE mouth rinse -- Mouth Rinse See Alternative Alphonse Guild, RN --    04/08/2021 1837 EST metoprolol tartrate (LOPRESSOR) injection 5 mg -- Intravenous MAR Unhold Transfer Provider, Automatic --    04/08/2021 1837 EST metroNIDAZOLE (FLAGYL) IVPB 500 mg -- Intravenous MAR Unhold  Transfer Provider, Automatic --    04/08/2021 1546 EST metroNIDAZOLE (FLAGYL) IVPB 500 mg 500 mg Intravenous Given Alain Marion, CRNA --    04/08/2021 1642 EST ondansetron (ZOFRAN) injection 4 mg Intravenous Given Alain Marion, CRNA --    04/08/2021 1837 EST ondansetron (ZOFRAN) injection 4 mg -- Intravenous MAR Unhold Transfer Provider, Automatic --    04/08/2021 1723 EST ondansetron (ZOFRAN) injection 4 mg 4 mg Intravenous Given Hubert Azure, RN --    04/08/2021 1837 EST ondansetron (ZOFRAN-ODT) disintegrating tablet 4 mg -- Oral MAR Unhold Transfer Provider, Automatic --    04/08/2021 1837 EST pantoprazole (PROTONIX) injection 40 mg -- Intravenous MAR Unhold Transfer Provider, Automatic --    04/08/2021 1551 EST PHENYLephrine 40 mcg/ml in normal saline Adult IV Push Syringe (For Blood Pressure Support) 80 mcg Intravenous Given Alain Marion, CRNA --    04/08/2021 1546 EST PHENYLephrine 40 mcg/ml in normal saline Adult IV Push Syringe (For Blood Pressure Support) 80 mcg Intravenous Given Alain Marion, CRNA --    04/08/2021 1540 EST propofol (DIPRIVAN) 10 mg/mL bolus/IV push 130 mg Intravenous Given Alain Marion, CRNA --    04/08/2021 1601 EST rocuronium bromide 10 mg/mL (PF) syringe 20 mg Intravenous Given Alain Marion, CRNA --    04/08/2021 1546 EST rocuronium bromide 10 mg/mL (PF) syringe 50 mg Intravenous Given Alain Marion, CRNA --    04/08/2021 1837 EST sodium chloride flush (NS) 0.9 % injection 10-40 mL -- Intracatheter MAR Unhold Transfer Provider, Automatic --    04/08/2021 1837 EST sodium chloride flush (NS) 0.9 % injection 10-40 mL -- Intracatheter MAR Unhold Transfer Provider, Automatic --    04/08/2021 1540 EST succinylcholine (ANECTINE) syringe 100 mg Intravenous Given Alain Marion, CRNA --    04/08/2021 1650 EST sugammadex sodium (BRIDION) injection 200 mg Intravenous Given Alain Marion, CRNA --    04/08/2021 1902 EST TPN ADULT (ION) -- Intravenous New Bag/Given  Shon Hale, RN --       Mobility {Mobility:[id number]}

## 2021-04-08 NOTE — Anesthesia Preprocedure Evaluation (Addendum)
Anesthesia Evaluation  Patient identified by MRN, date of birth, ID band Patient awake    Reviewed: Allergy & Precautions, NPO status , Patient's Chart, lab work & pertinent test results  Airway Mallampati: II  TM Distance: >3 FB Neck ROM: Full    Dental no notable dental hx. (+) Dental Advisory Given   Pulmonary neg pulmonary ROS,    Pulmonary exam normal breath sounds clear to auscultation       Cardiovascular hypertension, Pt. on medications Normal cardiovascular exam Rhythm:Regular Rate:Normal     Neuro/Psych  Headaches, negative psych ROS   GI/Hepatic GERD  Medicated and Controlled,Pancreatic mass   Endo/Other  diabetes, Well Controlled, Type 2, Oral Hypoglycemic Agents  Renal/GU Renal InsufficiencyRenal diseaseCr 1.4  negative genitourinary   Musculoskeletal  (+) Arthritis , Osteoarthritis,    Abdominal   Peds  Hematology negative hematology ROS (+) hct 36.7   Anesthesia Other Findings Electrolytes- hypoNa 127, hypoCl 97  Reproductive/Obstetrics negative OB ROS                            Anesthesia Physical Anesthesia Plan  ASA: 3  Anesthesia Plan: General   Post-op Pain Management: Tylenol PO (pre-op)   Induction: Intravenous  PONV Risk Score and Plan: 4 or greater and Ondansetron, Dexamethasone and Treatment may vary due to age or medical condition  Airway Management Planned: Oral ETT  Additional Equipment: None  Intra-op Plan:   Post-operative Plan: Extubation in OR  Informed Consent: I have reviewed the patients History and Physical, chart, labs and discussed the procedure including the risks, benefits and alternatives for the proposed anesthesia with the patient or authorized representative who has indicated his/her understanding and acceptance.     Dental advisory given  Plan Discussed with: CRNA  Anesthesia Plan Comments:        Anesthesia Quick  Evaluation

## 2021-04-09 ENCOUNTER — Encounter (HOSPITAL_COMMUNITY): Payer: Self-pay | Admitting: Surgery

## 2021-04-09 LAB — GLUCOSE, CAPILLARY
Glucose-Capillary: 164 mg/dL — ABNORMAL HIGH (ref 70–99)
Glucose-Capillary: 164 mg/dL — ABNORMAL HIGH (ref 70–99)
Glucose-Capillary: 167 mg/dL — ABNORMAL HIGH (ref 70–99)
Glucose-Capillary: 170 mg/dL — ABNORMAL HIGH (ref 70–99)
Glucose-Capillary: 205 mg/dL — ABNORMAL HIGH (ref 70–99)
Glucose-Capillary: 234 mg/dL — ABNORMAL HIGH (ref 70–99)
Glucose-Capillary: 239 mg/dL — ABNORMAL HIGH (ref 70–99)

## 2021-04-09 LAB — BASIC METABOLIC PANEL
Anion gap: 11 (ref 5–15)
BUN: 15 mg/dL (ref 8–23)
CO2: 21 mmol/L — ABNORMAL LOW (ref 22–32)
Calcium: 8.9 mg/dL (ref 8.9–10.3)
Chloride: 99 mmol/L (ref 98–111)
Creatinine, Ser: 1.13 mg/dL — ABNORMAL HIGH (ref 0.44–1.00)
GFR, Estimated: 51 mL/min — ABNORMAL LOW (ref >60)
Glucose, Bld: 284 mg/dL — ABNORMAL HIGH (ref 70–99)
Potassium: 4.4 mmol/L (ref 3.5–5.1)
Sodium: 131 mmol/L — ABNORMAL LOW (ref 135–145)

## 2021-04-09 LAB — CBC
HCT: 32.7 % — ABNORMAL LOW (ref 36.0–46.0)
Hemoglobin: 10.6 g/dL — ABNORMAL LOW (ref 12.0–15.0)
MCH: 29.2 pg (ref 26.0–34.0)
MCHC: 32.4 g/dL (ref 30.0–36.0)
MCV: 90.1 fL (ref 80.0–100.0)
Platelets: 213 10*3/uL (ref 150–400)
RBC: 3.63 MIL/uL — ABNORMAL LOW (ref 3.87–5.11)
RDW: 13.2 % (ref 11.5–15.5)
WBC: 14.2 10*3/uL — ABNORMAL HIGH (ref 4.0–10.5)
nRBC: 0 % (ref 0.0–0.2)

## 2021-04-09 LAB — MAGNESIUM: Magnesium: 1.4 mg/dL — ABNORMAL LOW (ref 1.7–2.4)

## 2021-04-09 LAB — PHOSPHORUS: Phosphorus: 4 mg/dL (ref 2.5–4.6)

## 2021-04-09 MED ORDER — ACETAMINOPHEN 10 MG/ML IV SOLN
1000.0000 mg | Freq: Three times a day (TID) | INTRAVENOUS | Status: AC
  Administered 2021-04-09 – 2021-04-10 (×3): 1000 mg via INTRAVENOUS
  Filled 2021-04-09 (×3): qty 100

## 2021-04-09 MED ORDER — DILTIAZEM HCL ER COATED BEADS 120 MG PO CP24
120.0000 mg | ORAL_CAPSULE | Freq: Every day | ORAL | Status: DC
  Administered 2021-04-09: 120 mg via ORAL
  Filled 2021-04-09: qty 1

## 2021-04-09 MED ORDER — MAGNESIUM SULFATE 2 GM/50ML IV SOLN
2.0000 g | Freq: Once | INTRAVENOUS | Status: AC
  Administered 2021-04-09: 2 g via INTRAVENOUS
  Filled 2021-04-09: qty 50

## 2021-04-09 MED ORDER — TRAVASOL 10 % IV SOLN
INTRAVENOUS | Status: AC
  Filled 2021-04-09: qty 374.4

## 2021-04-09 MED ORDER — HYDROMORPHONE HCL 1 MG/ML IJ SOLN
0.5000 mg | INTRAMUSCULAR | Status: DC | PRN
  Administered 2021-04-09 – 2021-04-14 (×36): 1 mg via INTRAVENOUS
  Filled 2021-04-09 (×38): qty 1

## 2021-04-09 MED ORDER — ENOXAPARIN SODIUM 40 MG/0.4ML IJ SOSY
40.0000 mg | PREFILLED_SYRINGE | Freq: Every day | INTRAMUSCULAR | Status: DC
  Administered 2021-04-09 – 2021-04-18 (×10): 40 mg via SUBCUTANEOUS
  Filled 2021-04-09 (×10): qty 0.4

## 2021-04-09 MED ORDER — METOPROLOL TARTRATE 5 MG/5ML IV SOLN
5.0000 mg | Freq: Once | INTRAVENOUS | Status: AC
  Administered 2021-04-09: 5 mg via INTRAVENOUS
  Filled 2021-04-09: qty 5

## 2021-04-09 MED ORDER — OXYCODONE HCL 5 MG PO TABS
5.0000 mg | ORAL_TABLET | ORAL | Status: DC | PRN
  Filled 2021-04-09: qty 1

## 2021-04-09 MED ORDER — VITAL HIGH PROTEIN PO LIQD: 1000.0000 mL | ORAL | Status: DC

## 2021-04-09 MED ORDER — SODIUM CHLORIDE 0.9 % IV BOLUS
500.0000 mL | Freq: Once | INTRAVENOUS | Status: AC
  Administered 2021-04-09: 500 mL via INTRAVENOUS

## 2021-04-09 MED ORDER — DILTIAZEM 12 MG/ML ORAL SUSPENSION
30.0000 mg | Freq: Three times a day (TID) | ORAL | Status: DC
  Filled 2021-04-09 (×2): qty 3

## 2021-04-09 MED ORDER — OSMOLITE 1.5 CAL PO LIQD
1000.0000 mL | ORAL | Status: DC
  Administered 2021-04-09 – 2021-04-10 (×2): 1000 mL
  Filled 2021-04-09 (×2): qty 1000

## 2021-04-09 MED ORDER — ACETAMINOPHEN 10 MG/ML IV SOLN: 1000.0000 mg | Freq: Three times a day (TID) | INTRAVENOUS | Status: DC

## 2021-04-09 NOTE — Progress Notes (Signed)
I was notified of patient's tachycardia to 130s. She is normotensive and afebrile. I examined her at bedside. She reports pain "all over." Abdomen is appropriately tender at incision but nondistended. J tube feeds running at 20. G tube output is bilious. Patient has had ongoing tachycardia which began intraoperatively. EKG this morning showed sinus tachycardia, labs were unremarkable. Absorption of home diltiazem dosing is likely poor. Will give one time dose of metoprolol 5mg  IV, and 500 mL bolus. Patient is nontoxic in appearance with no peritoneal signs on exam. Continue to monitor.

## 2021-04-09 NOTE — Evaluation (Signed)
Physical Therapy Evaluation Patient Details Name: Leah Pruitt MRN: [id number] DOB: [birth year] Today's Date: 04/09/2021  History of Present Illness  76 yo female s/p ex lap, biopsy of pancreatic mass, side-to-side antecolic bypass, and placement of GJ tube on 1/13. Pt was admitted several weeks ago for NG decompression given duodenal obstruction and persistent vomiting secondary to large mass in body of pancreas. PMH includes: appendectomy, cholecystectomy, chronic constipation, DM II, GERD, HTN, and migraines.  Clinical Impression   Pt presents with moderate abdominal pain, fatigue, and decreased activity tolerance vs baseline. Pt to benefit from acute PT to address deficits. Pt declined all OOB mobility given fatigue and pain, tolerated repositioning and rolling only today. Per pt and husband, pt has been OOB to bathroom but requests rest for the afternoon. PT encouraged pt to roll in bed for abdominal protection and comfort, LE exercises when in supine to promote circulation, and OOB mobility with hospital staff as tolerated post-operatively. Pt expresses understanding, but still declines OOB mobility with PT. Prior to d/c, pt will need to ambulate in hallways, practice stair navigation, and demonstrate improved mobility vs evaluation. PT to progress mobility as tolerated, and will continue to follow acutely.         Recommendations for follow up therapy are one component of a multi-disciplinary discharge planning process, led by the attending physician.  Recommendations may be updated based on patient status, additional functional criteria and insurance authorization.  Follow Up Recommendations Home health PT    Assistance Recommended at Discharge Intermittent Supervision/Assistance  Patient can return home with the following  A little help with walking and/or transfers;A little help with bathing/dressing/bathroom    Equipment Recommendations None recommended by PT  Recommendations for  Other Services       Functional Status Assessment Patient has had a recent decline in their functional status and demonstrates the ability to make significant improvements in function in a reasonable and predictable amount of time.     Precautions / Restrictions Precautions Precautions: Fall Precaution Comments: abdominal - educated pt and family on log roll technique to minimize abd discomfort Restrictions Weight Bearing Restrictions: No      Mobility  Bed Mobility Overal bed mobility: Needs Assistance Bed Mobility: Rolling Rolling: Modified independent (Device/Increase time)         General bed mobility comments: mod I for roll to L for increased time, boost up in bed for improved positioning with mod +2 (mostly limited due to pt fatigue and lack of desire to assist)    Transfers                   General transfer comment: NT - per pt and spouse pt has been in and out of bed multiple times today with HHA from husband. Pt declines EOB or OOB    Ambulation/Gait               General Gait Details: NT - per pt and spouse pt has been in and out of bed multiple times today with HHA from husband  Stairs            Wheelchair Mobility    Modified Rankin (Stroke Patients Only)       Balance Overall balance assessment: Mild deficits observed, not formally tested (has been requiring HHA with mobility)  Pertinent Vitals/Pain Pain Assessment: Faces Faces Pain Scale: Hurts even more Pain Location: abdomen Pain Descriptors / Indicators: Discomfort;Sore Pain Intervention(s): Limited activity within patient's tolerance;Monitored during session;Repositioned;Other (comment) (pain medication received right before PT arrival)    Home Living Family/patient expects to be discharged to:: Private residence Living Arrangements: Spouse/significant other Available Help at Discharge: Family;Available 24  hours/day Type of Home: House Home Access: Stairs to enter Entrance Stairs-Rails: Psychiatric nurse of Steps: 7   Home Layout: One level Home Equipment: Shower seat;Grab bars - tub/shower      Prior Function Prior Level of Function : Independent/Modified Independent                     Hand Dominance   Dominant Hand: Right    Extremity/Trunk Assessment   Upper Extremity Assessment Upper Extremity Assessment: Defer to OT evaluation    Lower Extremity Assessment Lower Extremity Assessment: Overall WFL for tasks assessed (assessed in supine per pt preference - able to perform heel slides to full ROM without pain, declines further testing)    Cervical / Trunk Assessment Cervical / Trunk Assessment: Other exceptions Cervical / Trunk Exceptions: abd surgery, new GJ tube and midline incision  Communication   Communication: No difficulties;Prefers language other than English (pt speaks spanish - pt and spouse declined use of interpreter)  Cognition Arousal/Alertness: Awake/alert (drowsy) Behavior During Therapy: Flat affect Overall Cognitive Status: Within Functional Limits for tasks assessed                                 General Comments: pt reports increased drowsiness secondary to pain medication        General Comments      Exercises General Exercises - Lower Extremity Heel Slides: AROM;Both;Supine (PT encouraged heel slides to promote circulation and prevent LE stiffness, pt declines for now due to fatigue)   Assessment/Plan    PT Assessment Patient needs continued PT services  PT Problem List Decreased strength;Decreased mobility;Decreased knowledge of precautions;Decreased activity tolerance;Decreased balance;Pain       PT Treatment Interventions DME instruction;Therapeutic activities;Gait training;Therapeutic exercise;Patient/family education;Balance training;Stair training;Functional mobility training;Neuromuscular  re-education    PT Goals (Current goals can be found in the Care Plan section)  Acute Rehab PT Goals Patient Stated Goal: rest, feel better PT Goal Formulation: With patient/family Time For Goal Achievement: 04/23/21 Potential to Achieve Goals: Good    Frequency Min 3X/week     Co-evaluation               AM-PAC PT "6 Clicks" Mobility  Outcome Measure Help needed turning from your back to your side while in a flat bed without using bedrails?: None Help needed moving from lying on your back to sitting on the side of a flat bed without using bedrails?: A Little Help needed moving to and from a bed to a chair (including a wheelchair)?: A Little Help needed standing up from a chair using your arms (e.g., wheelchair or bedside chair)?: A Little Help needed to walk in hospital room?: A Little Help needed climbing 3-5 steps with a railing? : A Little 6 Click Score: 19    End of Session   Activity Tolerance: Patient limited by fatigue;Patient limited by pain Patient left: in bed;with call bell/phone within reach;with family/visitor present Nurse Communication: Mobility status PT Visit Diagnosis: Other abnormalities of gait and mobility (R26.89);Pain Pain - Right/Left:  (mid) Pain - part of  body:  (abdomen)    Time: 3073-5430 PT Time Calculation (min) (ACUTE ONLY): 11 min   Charges:   PT Evaluation $PT Eval Low Complexity: 1 Low        Patty Leitzke S, PT DPT Acute Rehabilitation Services Pager [phone]  Office [phone]   Roxine Caddy E Ruffin Pyo 04/09/2021, 3:22 PM

## 2021-04-09 NOTE — Progress Notes (Signed)
PHARMACY - TOTAL PARENTERAL NUTRITION CONSULT NOTE  Indication: Duodenal obstruction / SBO  Patient Measurements: Height: 5\' 1"  (154.9 cm) Weight: 51.9 kg (114 lb 6.4 oz) IBW/kg (Calculated) : 47.8 TPN AdjBW (KG): 51.9 Body mass index is 21.62 kg/m.  Assessment:  63 YOM admitted in Dec 2023 for management of pancreatic mass; patient declined TPN during that admission and left AMA.  She is admitted 1/12 for duodenal obstruction secondary to a pancreatic mass. PMH significant for HTN, GERD, DM and arrhythmias.  Patient has reduced intake due to nausea and vomiting and has been losing weight since November 2022 (~11 lb loss).  It has been >7 days since she was able to eat a meal. She is at high risk for refeeding syndrome. Pharmacy consulted to manage TPN.   Glucose / Insulin: hx DM, A1c 6.4.  CBGs 200s post-op and down to 167 this AM. Used 6 units SSI 1/14 AM Electrolytes: Na up to 131, Mag down to 1.4, low CO2 - others WNL Renal: AKI, SCr down 1.4 (BL SCr ~1) Hepatic: LFTs / Tbili / TG WNL, prealbumin 28, albumin 3.7 Intake / Output; MIVF: 1 urine occurrence charted, LR at 100 ml/hr, refused NGT placement GI Imaging: N/A GI Surgeries / Procedures:  1/13: ex-lap with pancreatic mass bx, antecolic GJ bypass, GJ tube placement   Central access: double lumen PICC 04/07/21 TPN start date: 04/08/21  Nutritional Goals:  RD Estimated Needs Total Energy Estimated Needs: 1600-1800 Total Protein Estimated Needs: 80-95 grams Total Fluid Estimated Needs: >/= 1.6 L Goal rate 70 ml/hr (provides 87 g protein and 1724 kcal) Goal TF rate per RD:  Osmolite 1.5 at 45 ml/hr with BID Prosource  Current Nutrition:  TPN  Plan:  Start Osmolite 1.5 at 20 ml/hr per Surgery = 30g AA, 98g CHO, 720 kCal  Continue TPN at 7mL/hr with TF initiation, given refeeding risk TPN provides 37g AA, 122g CHO and 17g ILE for a total of 739 kCal TPN and TF will meet >80% of patient's needs Electrolytes in TPN:  increase Na to 57mEq/L, K 70mEq/L, Ca 30mEq/L, increase Mg to 9mEq/L, Phos 70mmol/L. Cl:Ac 1:1 for now  Add standard MVI and trace elements to TPN Continue sensitive SSI Q4H NS at 70 mL/hr  Mag sulfate 2gm IV x 1 Standard TPN labs on Mon/Thurs, labs in AM F/U with TF tolerance to advance and wean off of TPN  Zariah Jost D. Mina Marble, PharmD, BCPS, [place] 04/09/2021, 8:28 AM

## 2021-04-09 NOTE — Progress Notes (Signed)
° ° °  1 Day Post-Op  Subjective: Mildly tachycardic overnight 110s, EKG shows sinus tach. Patient endorses pain but says the dilaudid helps. Denies nausea/vomiting. Only one urine occurrence charted but patient reports she is frequently ambulating to bathroom and urinating. Creatinine continues to downtrend.   Objective: Vital signs in last 24 hours: Temp:  [97.6 F (36.4 C)-98.4 F (36.9 C)] 98.3 F (36.8 C) (01/14 0358) Pulse Rate:  [86-123] 113 (01/14 0358) Resp:  [15-25] 16 (01/14 0358) BP: (107-158)/(67-117) 109/79 (01/14 0358) SpO2:  [97 %-100 %] 97 % (01/14 0358) Last BM Date: 03/22/21  Intake/Output from previous day: 01/13 0701 - 01/14 0700 In: 1550 [I.V.:1000; IV Piggyback:550] Out: 20 [Blood:20] Intake/Output this shift: No intake/output data recorded.  PE: General: resting comfortably, NAD Neuro: alert and oriented, no focal deficits Resp: normal work of breathing Abdomen: soft, nondistended, nontender to palpation. Midline incision clean and dry with no erythema or induration. LUQ GJ tube, with G tube to gravity and draining bilious fluid. Extremities: warm and well-perfused   Lab Results:  Recent Labs    04/07/21 1412  WBC 7.6  HGB 12.5  HCT 36.7  PLT 319   BMET Recent Labs    04/08/21 0122 04/09/21 0220  NA 127* 131*  K 4.4 4.4  CL 97* 99  CO2 24 21*  GLUCOSE 129* 284*  BUN 19 15  CREATININE 1.40* 1.13*  CALCIUM 8.8* 8.9   PT/INR Recent Labs    04/07/21 1412  LABPROT 12.8  INR 1.0   CMP     Component Value Date/Time   NA 131 (L) 04/09/2021 0220   NA 133 (A) 03/31/2021 0000   K 4.4 04/09/2021 0220   CL 99 04/09/2021 0220   CO2 21 (L) 04/09/2021 0220   GLUCOSE 284 (H) 04/09/2021 0220   BUN 15 04/09/2021 0220   BUN 17 03/31/2021 0000   CREATININE 1.13 (H) 04/09/2021 0220   CALCIUM 8.9 04/09/2021 0220   PROT 6.0 (L) 04/08/2021 0122   ALBUMIN 3.7 04/08/2021 0122   AST 17 04/08/2021 0122   ALT 13 04/08/2021 0122   ALKPHOS 84  04/08/2021 0122   BILITOT 0.6 04/08/2021 0122   GFRNONAA 51 (L) 04/09/2021 0220   GFRAA >60 10/11/2016 1100   Lipase     Component Value Date/Time   LIPASE 31 03/23/2021 0420       Studies/Results: [place] EKG SITE RITE  Result Date: 04/07/2021 If Site Rite image not attached, placement could not be confirmed due to current cardiac rhythm.       Assessment/Plan  76 yo female with infiltrative pancreatic body mass causing duodenal obstruction and left hydronephrosis. POD1 s/p GJ bypass, placement of GJ feeding tube, and biopsy of pancreatic mass. - Keep G tube to gravity today for gastric decompression - Ok for sips of clear liquids and medications by mouth, clamp G tube after med administration.  - Begin trophic tube feeds via J tube at 22ml/hr, do not advance today - No medications are to be given via J tube to minimize risk of clogging. - Continue TPN until feeds are close to goal - Tachycardia: monitor, may be secondary to pain. Resume home diltiazem today. - VTE: lovenox, SCDs - Ambulate, PT ordered - Dispo: inpatient, med-surg floor   LOS: 2 days    Michaelle Birks, MD [facility] Surgery General, Hepatobiliary and Pancreatic Surgery 04/09/21 7:18 AM

## 2021-04-09 NOTE — Progress Notes (Signed)
Notified by Tech that Archie of patient is elevated, patient is in pain as well, medication given, per patient pain is 5/10,generalized soreness.  She is on resting position, Hr rechecked and ranging between 115-128, paged Dr. Zenia Resides to inquire if we can put [patient on telemetry and if we can give lopressor, per Dr. Zenia Resides lopressor will remain for BP only and not HR parameters, not to be given at the moment.    04/09/21 2049 04/09/21 2100  Assess: MEWS Score  Temp 98.6 F (37 C)  --   BP 105/65  --   Pulse Rate (!) 115 (!) 125  Resp 18  --   SpO2 97 %  --   O2 Device Room Air  --   Assess: MEWS Score  MEWS Temp 0 0  MEWS Systolic 0 0  MEWS Pulse 2 2  MEWS RR 0 0  MEWS LOC 0 0  MEWS Score 2 2  MEWS Score Color Yellow Yellow  Assess: if the MEWS score is Yellow or Red  Were vital signs taken at a resting state? Yes  --   Focused Assessment No change from prior assessment  --   Does the patient meet 2 or more of the SIRS criteria? No  --   Early Detection of Sepsis Score *See Row Information* Medium  --   MEWS guidelines implemented *See Row Information* Yes  --   Treat  MEWS Interventions Administered scheduled meds/treatments;Escalated (See documentation below)  --   Take Vital Signs  Increase Vital Sign Frequency  Yellow: Q 2hr X 2 then Q 4hr X 2, if remains yellow, continue Q 4hrs  --   Escalate  MEWS: Escalate Yellow: discuss with charge nurse/RN and consider discussing with provider and RRT  --   Notify: Charge Nurse/RN  Name of Charge Nurse/RN Notified nyche rn  --   Date Charge Nurse/RN Notified 04/09/21  --   Time Charge Nurse/RN Notified 2130  --   Notify: Provider  Provider Name/Title Michaelle Birks  --   Date Provider Notified 04/09/21  --   Time Provider Notified 2130  --   Notification Type Page  --   Notification Reason  (tele order)  --   Provider response See new orders  --   Date of Provider Response 04/09/21  --   Time of Provider Response 2145  --

## 2021-04-10 LAB — CBC
HCT: 25.8 % — ABNORMAL LOW (ref 36.0–46.0)
Hemoglobin: 8.6 g/dL — ABNORMAL LOW (ref 12.0–15.0)
MCH: 29.9 pg (ref 26.0–34.0)
MCHC: 33.3 g/dL (ref 30.0–36.0)
MCV: 89.6 fL (ref 80.0–100.0)
Platelets: 145 10*3/uL — ABNORMAL LOW (ref 150–400)
RBC: 2.88 MIL/uL — ABNORMAL LOW (ref 3.87–5.11)
RDW: 13.3 % (ref 11.5–15.5)
WBC: 10.9 10*3/uL — ABNORMAL HIGH (ref 4.0–10.5)
nRBC: 0 % (ref 0.0–0.2)

## 2021-04-10 LAB — BASIC METABOLIC PANEL
Anion gap: 7 (ref 5–15)
BUN: 17 mg/dL (ref 8–23)
CO2: 21 mmol/L — ABNORMAL LOW (ref 22–32)
Calcium: 8.4 mg/dL — ABNORMAL LOW (ref 8.9–10.3)
Chloride: 103 mmol/L (ref 98–111)
Creatinine, Ser: 1 mg/dL (ref 0.44–1.00)
GFR, Estimated: 59 mL/min — ABNORMAL LOW (ref >60)
Glucose, Bld: 187 mg/dL — ABNORMAL HIGH (ref 70–99)
Potassium: 4 mmol/L (ref 3.5–5.1)
Sodium: 131 mmol/L — ABNORMAL LOW (ref 135–145)

## 2021-04-10 LAB — HEMOGLOBIN AND HEMATOCRIT, BLOOD
HCT: 26.5 % — ABNORMAL LOW (ref 36.0–46.0)
Hemoglobin: 8.9 g/dL — ABNORMAL LOW (ref 12.0–15.0)

## 2021-04-10 LAB — GLUCOSE, CAPILLARY
Glucose-Capillary: 170 mg/dL — ABNORMAL HIGH (ref 70–99)
Glucose-Capillary: 169 mg/dL — ABNORMAL HIGH (ref 70–99)
Glucose-Capillary: 220 mg/dL — ABNORMAL HIGH (ref 70–99)
Glucose-Capillary: 147 mg/dL — ABNORMAL HIGH (ref 70–99)
Glucose-Capillary: 147 mg/dL — ABNORMAL HIGH (ref 70–99)

## 2021-04-10 LAB — TROPONIN I (HIGH SENSITIVITY): Troponin I (High Sensitivity): 7 ng/L (ref <18)

## 2021-04-10 LAB — PHOSPHORUS: Phosphorus: 2.6 mg/dL (ref 2.5–4.6)

## 2021-04-10 LAB — MAGNESIUM: Magnesium: 1.6 mg/dL — ABNORMAL LOW (ref 1.7–2.4)

## 2021-04-10 MED ORDER — TRAVASOL 10 % IV SOLN
INTRAVENOUS | Status: DC
  Filled 2021-04-10: qty 561.6

## 2021-04-10 MED ORDER — ACETAMINOPHEN 325 MG PO TABS
650.0000 mg | ORAL_TABLET | Freq: Four times a day (QID) | ORAL | Status: DC | PRN
  Administered 2021-04-10 – 2021-04-11 (×2): 650 mg via ORAL
  Filled 2021-04-10 (×2): qty 2

## 2021-04-10 MED ORDER — MAGNESIUM SULFATE 2 GM/50ML IV SOLN
2.0000 g | Freq: Once | INTRAVENOUS | Status: AC
  Administered 2021-04-10: 2 g via INTRAVENOUS
  Filled 2021-04-10: qty 50

## 2021-04-10 MED ORDER — INSULIN ASPART 100 UNIT/ML IJ SOLN
0.0000 [IU] | INTRAMUSCULAR | Status: DC
  Administered 2021-04-10 (×2): 2 [IU] via SUBCUTANEOUS
  Administered 2021-04-10: 5 [IU] via SUBCUTANEOUS
  Administered 2021-04-10 – 2021-04-11 (×2): 3 [IU] via SUBCUTANEOUS
  Administered 2021-04-11: 5 [IU] via SUBCUTANEOUS
  Administered 2021-04-11 (×2): 3 [IU] via SUBCUTANEOUS
  Administered 2021-04-11: 2 [IU] via SUBCUTANEOUS
  Administered 2021-04-12: 3 [IU] via SUBCUTANEOUS
  Administered 2021-04-12: 5 [IU] via SUBCUTANEOUS
  Administered 2021-04-12 – 2021-04-13 (×3): 3 [IU] via SUBCUTANEOUS
  Administered 2021-04-13: 5 [IU] via SUBCUTANEOUS

## 2021-04-10 MED ORDER — SODIUM PHOSPHATES 45 MMOLE/15ML IV SOLN
15.0000 mmol | Freq: Once | INTRAVENOUS | Status: AC
  Administered 2021-04-10: 15 mmol via INTRAVENOUS
  Filled 2021-04-10: qty 5

## 2021-04-10 NOTE — Plan of Care (Signed)
°  Problem: Clinical Measurements: Goal: Will remain free from infection Outcome: Not Progressing   Problem: Coping: Goal: Level of anxiety will decrease Outcome: Not Progressing   Problem: Clinical Measurements: Goal: Cardiovascular complication will be avoided Outcome: Not Progressing   Problem: Activity: Goal: Risk for activity intolerance will decrease Outcome: Not Progressing   Problem: Nutrition: Goal: Adequate nutrition will be maintained Outcome: Not Progressing   Problem: Elimination: Goal: Will not experience complications related to bowel motility Outcome: Not Progressing   Problem: Safety: Goal: Ability to remain free from injury will improve Outcome: Not Progressing

## 2021-04-10 NOTE — Progress Notes (Signed)
PHARMACY - TOTAL PARENTERAL NUTRITION CONSULT NOTE  Indication: Duodenal obstruction / SBO  Patient Measurements: Height: 5\' 1"  (154.9 cm) Weight: 51.9 kg (114 lb 6.4 oz) IBW/kg (Calculated) : 47.8 TPN AdjBW (KG): 51.9 Body mass index is 21.62 kg/m.  Assessment:  12 YOM admitted in Dec 2023 for management of pancreatic mass; patient declined TPN during that admission and left AMA.  She is admitted 1/12 for duodenal obstruction secondary to a pancreatic mass. PMH significant for HTN, GERD, DM and arrhythmias.  Patient has reduced intake due to nausea and vomiting and has been losing weight since November 2022 (~11 lb loss).  It has been >7 days since she was able to eat a meal. She is at high risk for refeeding syndrome. Pharmacy consulted to manage TPN.   Glucose / Insulin: hx DM, A1c 6.4.  CBGs 200s post-op 1/13 and early 1/14 >> now controlled < 180 Used 14 units SSI in past 24 hrs Electrolytes: Na 131, Mag up to 1.6 post 2 gm, low CO2, Phos low normal, others WNL Renal: AKI resolved - SCr down to 1, BUN WNL Hepatic: LFTs / Tbili / TG WNL, prealbumin 28, albumin 3.7 Intake / Output; MIVF: UOP 0.5 ml/kg/hr, NS at 70 ml/hr, Gtube to gravity - 435mL GI Imaging: N/A GI Surgeries / Procedures:  1/13: ex-lap with pancreatic mass bx, antecolic GJ bypass, GJ tube placement   Central access: double lumen PICC 04/07/21 TPN start date: 04/08/21  Nutritional Goals:  RD Estimated Needs Total Energy Estimated Needs: 1600-1800 Total Protein Estimated Needs: 80-95 grams Total Fluid Estimated Needs: >/= 1.6 L Goal rate 70 ml/hr (provides 87 g protein and 1724 kcal) Goal TF rate per RD:  Osmolite 1.5 at 45 ml/hr with BID Prosource  Current Nutrition:  TPN TF   Plan:  Continue Osmolite 1.5 at 20 ml/hr per Surgery, providing 30g AA, 98g CHO and 720 kCal  Increase TPN to 45 ml/hr, balancing between refeeding and TF absorption/provision TPN will provide 56g AA, 184g CHO, 26g ILE and 1109 kCal,  meeting >60% of needs TPN and TF will meet ~100% of patient's needs Electrolytes in TPN: increase Na to 159mEq/L, K 5mEq/L, Ca 32mEq/L, increase Mg to 39mEq/L, increase Phos to 14mmol/L, change Cl:Ac to 1:2 Add standard MVI and trace elements to TPN Increase SSI to moderate Q4H Continue NS at 70 ml/hr (received NS bolus this AM so will not reduce with increasing TPN rate) Mag sulfate 2gm IV x 1 per MD given NaPhos 15 mmol IV x 1 Standard TPN labs on Mon/Thurs F/U with TF tolerance to advance and wean off of TPN  Claribel Sachs D. Mina Marble, PharmD, BCPS, [place] 04/10/2021, 7:54 AM

## 2021-04-10 NOTE — Progress Notes (Signed)
Patient ID: Leah Pruitt, female   DOB: [birth year], 76 y.o.   MRN: [id number] Hb stable at 8.9. HR down to around 100.  Georganna Skeans, MD, MPH, FACS Please use [email or web address] to contact on call provider

## 2021-04-10 NOTE — Progress Notes (Signed)
2 Days Post-Op  Subjective: Patient had improvement in tachycardia overnight, but rate is 130 again this morning. Appears to be sinus tach on telemetry. SBP is soft in 90s this morning, patient denies dizziness. Up to commode this morning. She complains primarily of chest pain and headache, but says abdominal pain is mild. Afebrile, WBC 10, hgb 8.6 (from 10.6 yesterday). No BM since surgery.   Objective: Vital signs in last 24 hours: Temp:  [97.6 F (36.4 C)-99.2 F (37.3 C)] 98.3 F (36.8 C) (01/15 0306) Pulse Rate:  [94-136] 97 (01/15 0317) Resp:  [16-18] 16 (01/15 0306) BP: (87-118)/(48-71) 92/56 (01/15 0317) SpO2:  [94 %-97 %] 97 % (01/15 0306) Last BM Date: 03/22/21  Intake/Output from previous day: 01/14 0701 - 01/15 0700 In: 2811.2 [I.V.:2160.4; NG/GT:175.7; IV Piggyback:475.1] Out: 600 [Urine:300; Drains:300] Intake/Output this shift: Total I/O In: 945.3 [I.V.:570.3; IV Piggyback:375] Out: 600 [Urine:300; Drains:300]  PE: General: resting comfortably, NAD Neuro: alert and oriented, no focal deficits Resp: normal work of breathing CV: tachycardic 130, regular rhythm Abdomen: soft, nondistended, nontender to palpation. Midline incision clean and dry with no erythema or induration. LUQ GJ tube, with G tube to gravity and draining bilious fluid, J tube with feeds at 20 ml/hr, some leakage of gastric contents around tube at skin. Extremities: warm and well-perfused   Lab Results:  Recent Labs    04/09/21 0657 04/10/21 0416  WBC 14.2* 10.9*  HGB 10.6* 8.6*  HCT 32.7* 25.8*  PLT 213 145*   BMET Recent Labs    04/09/21 0220 04/10/21 0416  NA 131* 131*  K 4.4 4.0  CL 99 103  CO2 21* 21*  GLUCOSE 284* 187*  BUN 15 17  CREATININE 1.13* 1.00  CALCIUM 8.9 8.4*   PT/INR Recent Labs    04/07/21 1412  LABPROT 12.8  INR 1.0   CMP     Component Value Date/Time   NA 131 (L) 04/10/2021 0416   NA 133 (A) 03/31/2021 0000   K 4.0 04/10/2021 0416   CL 103  04/10/2021 0416   CO2 21 (L) 04/10/2021 0416   GLUCOSE 187 (H) 04/10/2021 0416   BUN 17 04/10/2021 0416   BUN 17 03/31/2021 0000   CREATININE 1.00 04/10/2021 0416   CALCIUM 8.4 (L) 04/10/2021 0416   PROT 6.0 (L) 04/08/2021 0122   ALBUMIN 3.7 04/08/2021 0122   AST 17 04/08/2021 0122   ALT 13 04/08/2021 0122   ALKPHOS 84 04/08/2021 0122   BILITOT 0.6 04/08/2021 0122   GFRNONAA 59 (L) 04/10/2021 0416   GFRAA >60 10/11/2016 1100   Lipase     Component Value Date/Time   LIPASE 31 03/23/2021 0420       Studies/Results: No results found.      Assessment/Plan  76 yo female with infiltrative pancreatic body mass causing duodenal obstruction and left hydronephrosis. POD2 s/p GJ bypass, placement of GJ feeding tube, and biopsy of pancreatic mass. - Chest pain: May be secondary to reflux, which patient has a significant history of. EKG with sinus tach, no ST elevation, will check troponin this morning.  - Tachycardia: Etiology is unclear, patient seems to be euvolemic. No peritoneal signs on exam to suggest a missed enterotomy, and patient is afebrile with normal WBC. - Soft BP - hold diltiazem today - Keep G tube to gravity today for gastric decompression, continue J tube feeds at 20 ml/hr, do not advance today given tachycardia. - Ok for sips of clear liquids and medications by  mouth, clamp G tube after med administration.  - No medications are to be given via J tube to minimize risk of clogging. - Continue TPN until feeds are close to goal - Anemia: 2-point drop in hgb in 24 hours, may be hemodilution but will recheck hgb at noon today. - VTE: lovenox, SCDs - Ambulate, PT ordered - Dispo: inpatient, med-surg floor   LOS: 3 days    Michaelle Birks, MD [facility] Surgery General, Hepatobiliary and Pancreatic Surgery 04/10/21 6:10 AM

## 2021-04-11 ENCOUNTER — Encounter: Payer: Self-pay | Admitting: Hematology and Oncology

## 2021-04-11 ENCOUNTER — Encounter: Payer: Medicare Other | Admitting: Gastroenterology

## 2021-04-11 ENCOUNTER — Inpatient Hospital Stay (HOSPITAL_COMMUNITY): Payer: Medicare Other

## 2021-04-11 LAB — COMPREHENSIVE METABOLIC PANEL
ALT: 10 U/L (ref 0–44)
AST: 19 U/L (ref 15–41)
Albumin: 2.4 g/dL — ABNORMAL LOW (ref 3.5–5.0)
Alkaline Phosphatase: 65 U/L (ref 38–126)
Anion gap: 4 — ABNORMAL LOW (ref 5–15)
BUN: 13 mg/dL (ref 8–23)
CO2: 23 mmol/L (ref 22–32)
Calcium: 7.8 mg/dL — ABNORMAL LOW (ref 8.9–10.3)
Chloride: 104 mmol/L (ref 98–111)
Creatinine, Ser: 0.87 mg/dL (ref 0.44–1.00)
GFR, Estimated: >60 mL/min (ref >60)
Glucose, Bld: 164 mg/dL — ABNORMAL HIGH (ref 70–99)
Potassium: 3.8 mmol/L (ref 3.5–5.1)
Sodium: 131 mmol/L — ABNORMAL LOW (ref 135–145)
Total Bilirubin: 0.2 mg/dL — ABNORMAL LOW (ref 0.3–1.2)
Total Protein: 4.8 g/dL — ABNORMAL LOW (ref 6.5–8.1)

## 2021-04-11 LAB — PHOSPHORUS: Phosphorus: 3.1 mg/dL (ref 2.5–4.6)

## 2021-04-11 LAB — CBC
HCT: 24.7 % — ABNORMAL LOW (ref 36.0–46.0)
Hemoglobin: 8.3 g/dL — ABNORMAL LOW (ref 12.0–15.0)
MCH: 29.6 pg (ref 26.0–34.0)
MCHC: 33.6 g/dL (ref 30.0–36.0)
MCV: 88.2 fL (ref 80.0–100.0)
Platelets: 136 10*3/uL — ABNORMAL LOW (ref 150–400)
RBC: 2.8 MIL/uL — ABNORMAL LOW (ref 3.87–5.11)
RDW: 13.7 % (ref 11.5–15.5)
WBC: 8.8 10*3/uL (ref 4.0–10.5)
nRBC: 0 % (ref 0.0–0.2)

## 2021-04-11 LAB — GLUCOSE, CAPILLARY
Glucose-Capillary: 136 mg/dL — ABNORMAL HIGH (ref 70–99)
Glucose-Capillary: 138 mg/dL — ABNORMAL HIGH (ref 70–99)
Glucose-Capillary: 167 mg/dL — ABNORMAL HIGH (ref 70–99)
Glucose-Capillary: 170 mg/dL — ABNORMAL HIGH (ref 70–99)
Glucose-Capillary: 235 mg/dL — ABNORMAL HIGH (ref 70–99)
Glucose-Capillary: 84 mg/dL (ref 70–99)

## 2021-04-11 LAB — TRIGLYCERIDES: Triglycerides: 96 mg/dL (ref <150)

## 2021-04-11 LAB — MAGNESIUM: Magnesium: 1.7 mg/dL (ref 1.7–2.4)

## 2021-04-11 LAB — LACTIC ACID, PLASMA: Lactic Acid, Venous: 1.4 mmol/L (ref 0.5–1.9)

## 2021-04-11 LAB — LIPASE, BLOOD: Lipase: 20 U/L (ref 11–51)

## 2021-04-11 MED ORDER — TRAVASOL 10 % IV SOLN
INTRAVENOUS | Status: AC
  Filled 2021-04-11: qty 873.6

## 2021-04-11 MED ORDER — DILTIAZEM 12 MG/ML ORAL SUSPENSION
30.0000 mg | Freq: Four times a day (QID) | ORAL | Status: DC
  Administered 2021-04-11 – 2021-04-18 (×28): 30 mg
  Filled 2021-04-11 (×30): qty 3

## 2021-04-11 MED ORDER — SODIUM CHLORIDE 0.9 % IV BOLUS
1000.0000 mL | Freq: Once | INTRAVENOUS | Status: AC
  Administered 2021-04-11: 1000 mL via INTRAVENOUS

## 2021-04-11 MED ORDER — MAGNESIUM SULFATE 50 % IJ SOLN
3.0000 g | Freq: Once | INTRAVENOUS | Status: AC
  Administered 2021-04-11: 3 g via INTRAVENOUS
  Filled 2021-04-11: qty 6

## 2021-04-11 NOTE — Progress Notes (Signed)
3 Days Post-Op  Subjective: Patient febrile to 39.2 with tachycardia to 140s during febrile episode. Prior this HR ranged from 110s to 120s. Patient has remained normotensive with SBP 120s-130s. On exam she reports itching and continued discomfort in chest, but denies abdominal pain/discomfort. Troponin yesterday was normal. Labs this morning are unremarkable, WBC is normal, creatinine normal. Patient remains mildly hyponatremic.   Objective: Vital signs in last 24 hours: Temp:  [97.5 F (36.4 C)-102.5 F (39.2 C)] 98.3 F (36.8 C) (01/16 0445) Pulse Rate:  [102-148] 132 (01/16 0445) Resp:  [16-20] 20 (01/16 0445) BP: (97-125)/(57-82) 120/72 (01/16 0401) SpO2:  [95 %-98 %] 95 % (01/16 0445) Last BM Date: 03/22/22  Intake/Output from previous day: 01/15 0701 - 01/16 0700 In: 1594.1 [I.V.:1339.5; NG/GT:254.7] Out: 500 [Drains:500] Intake/Output this shift: Total I/O In: 434.5 [I.V.:434.5] Out: 300 [Drains:300]  PE: General: resting comfortably, NAD Neuro: alert and oriented, no focal deficits Resp: normal work of breathing CV: tachycardic 136, regular rhythm Abdomen: soft, nondistended, nontender to deep palpation, no abdominal wall erythema. GJ tube in place with G tube to gravity, draining gastric contents. Extremities: warm and well-perfused   Lab Results:  Recent Labs    04/10/21 0416 04/10/21 1123 04/11/21 0345  WBC 10.9*  --  8.8  HGB 8.6* 8.9* 8.3*  HCT 25.8* 26.5* 24.7*  PLT 145*  --  136*   BMET Recent Labs    04/10/21 0416 04/11/21 0345  NA 131* 131*  K 4.0 3.8  CL 103 104  CO2 21* 23  GLUCOSE 187* 164*  BUN 17 13  CREATININE 1.00 0.87  CALCIUM 8.4* 7.8*   PT/INR No results for input(s): LABPROT, INR in the last 72 hours.  CMP     Component Value Date/Time   NA 131 (L) 04/11/2021 0345   NA 133 (A) 03/31/2021 0000   K 3.8 04/11/2021 0345   CL 104 04/11/2021 0345   CO2 23 04/11/2021 0345   GLUCOSE 164 (H) 04/11/2021 0345   BUN 13  04/11/2021 0345   BUN 17 03/31/2021 0000   CREATININE 0.87 04/11/2021 0345   CALCIUM 7.8 (L) 04/11/2021 0345   PROT 4.8 (L) 04/11/2021 0345   ALBUMIN 2.4 (L) 04/11/2021 0345   AST 19 04/11/2021 0345   ALT 10 04/11/2021 0345   ALKPHOS 65 04/11/2021 0345   BILITOT 0.2 (L) 04/11/2021 0345   GFRNONAA >60 04/11/2021 0345   GFRAA >60 10/11/2016 1100   Lipase     Component Value Date/Time   LIPASE 31 03/23/2021 0420       Studies/Results: DG CHEST PORT 1 VIEW  Result Date: 04/11/2021 CLINICAL DATA:  Encounter for fever. EXAM: PORTABLE CHEST 1 VIEW COMPARISON:  Portable chest 03/24/2008. FINDINGS: The lungs are expiratory. Small left pleural effusion is present and overlying coarse linear markings which could be atelectasis or atelectasis intermixed with pneumonic infiltrate. There are atelectatic bands in the right perihilar area and in the hypoinflated right base. The remainder of the lungs appear clear. There is mild cardiomegaly, normal central vasculature, stable mediastinum with aortic tortuosity arch calcifications. Right PICC terminates in the upper right atrium. Osteopenia and thoracic spondylosis. IMPRESSION: 1. Limited expiratory study. There are asymmetric coarse markings in the left base and small left effusion. Findings could be due to atelectasis or a combination of atelectasis and pneumonia. PA and lateral study in full inspiration recommended. 2. Right PICC tip in the upper right atrium. 3. Aortic atherosclerosis. Electronically Signed   By: Lanny Hurst  Chesser M.D.   On: 04/11/2021 03:07        Assessment/Plan  76 yo female with infiltrative pancreatic body mass causing duodenal obstruction and left hydronephrosis. POD3 s/p GJ bypass, placement of GJ feeding tube, and biopsy of pancreatic mass. - Fever: Etiology unclear, abdomen is very soft and nontender and WBC, so I do not think the patient has an enteric leak. May be related to atelectasis. CXR with small left pleural  effusion. Blood cultures pending. Will defer abdominal CT as abdomen is soft and expect findings to be nonspecific on POD3. - Resume home diltiazem. Convert to liquid formulation with QID dosing, to be given via J tube to improve absorption. - Keep G tube to gravity for gastric decompression. Tube feeds on hold for workup of fever/tachycardia. - Ok for sips of clear liquids and medications by mouth. Liquid medications only via J tube, no crushed meds. - Continue TPN - VTE: lovenox, SCDs - Ambulate, PT ordered - Dispo: transfer to progressive care due to sustained tachycardia   LOS: 4 days    Michaelle Birks, MD [facility] General, Hepatobiliary and Pancreatic Surgery 04/11/21 4:51 AM

## 2021-04-11 NOTE — Progress Notes (Signed)
°  Patient noted to be febrile, paged Dr. Zenia Resides, chest Xray ordered jejunostomy feeding to be on hold for now, tylenol given  PO. Updated Charge nurse Nyshe.    04/11/21 0145  Assess: MEWS Score  Temp (!) 101.4 F (38.6 C)  BP 125/82  Pulse Rate (!) 128  Resp 19  SpO2 97 %  O2 Device Room Air  Assess: MEWS Score  MEWS Temp 1  MEWS Systolic 0  MEWS Pulse 2  MEWS RR 0  MEWS LOC 0  MEWS Score 3  MEWS Score Color Yellow  Assess: if the MEWS score is Yellow or Red  Were vital signs taken at a resting state? Yes  Focused Assessment Change from prior assessment (see assessment flowsheet)  Does the patient meet 2 or more of the SIRS criteria? Yes  Early Detection of Sepsis Score *See Row Information* High  MEWS guidelines implemented *See Row Information* No, previously yellow, continue vital signs every 4 hours  Take Vital Signs  Increase Vital Sign Frequency  Yellow: Q 2hr X 2 then Q 4hr X 2, if remains yellow, continue Q 4hrs  Escalate  MEWS: Escalate Yellow: discuss with charge nurse/RN and consider discussing with provider and RRT  Notify: Charge Nurse/RN  Name of Charge Nurse/RN Notified nyche, RN  Date Charge Nurse/RN Notified 04/11/21  Time Charge Nurse/RN Notified 0159  Notify: Provider  Provider Name/Title Michaelle Birks  Date Provider Notified 04/10/21  Time Provider Notified 0157  Notification Type Page  Notification Reason Change in status  Provider response See new orders  Date of Provider Response 04/10/21  Time of Provider Response 0200

## 2021-04-11 NOTE — Plan of Care (Signed)
°  Problem: Clinical Measurements: Goal: Will remain free from infection Outcome: Not Progressing   Problem: Clinical Measurements: Goal: Cardiovascular complication will be avoided Outcome: Not Progressing   Problem: Coping: Goal: Level of anxiety will decrease Outcome: Not Progressing   Problem: Elimination: Goal: Will not experience complications related to bowel motility Outcome: Not Progressing   Problem: Pain Managment: Goal: General experience of comfort will improve Outcome: Not Progressing

## 2021-04-11 NOTE — Progress Notes (Signed)
Physical Therapy Treatment Patient Details Name: Leah Pruitt MRN: [id number] DOB: 19-May-1945 Today's Date: 04/11/2021   History of Present Illness 76 yo female s/p ex lap, biopsy of pancreatic mass, side-to-side antecolic bypass, and placement of GJ tube on 1/13. Pt was admitted several weeks ago for NG decompression given duodenal obstruction and persistent vomiting secondary to large mass in body of pancreas. PMH includes: appendectomy, cholecystectomy, chronic constipation, DM II, GERD, HTN, and migraines.    PT Comments    Pt's HR at low 130's on arrival.  Pt not happy about general ambulation, but wanting to get OOB to toilet and brush teeth.  Emphasis on using those tasks to get some steps in.  Pt actually took much longer to toilet and brush teeth and general ambulation with HR in the upper 140's for a sustained time.    Recommendations for follow up therapy are one component of a multi-disciplinary discharge planning process, led by the attending physician.  Recommendations may be updated based on patient status, additional functional criteria and insurance authorization.  Follow Up Recommendations  Home health PT     Assistance Recommended at Discharge Intermittent Supervision/Assistance  Patient can return home with the following A lot of help with walking and/or transfers;A lot of help with bathing/dressing/bathroom   Equipment Recommendations  None recommended by PT    Recommendations for Other Services       Precautions / Restrictions Precautions Precautions: Fall Precaution Comments: abdominal - educated pt and family on log roll technique to minimize abd discomfort     Mobility  Bed Mobility Overal bed mobility: Needs Assistance Bed Mobility: Rolling;Sidelying to Sit Rolling: Min assist Sidelying to sit: Min assist       General bed mobility comments: slow and guarded up to EOB with minimal assist and HOB raised.    Transfers Overall transfer level:  Needs assistance   Transfers: Sit to/from Stand;Bed to chair/wheelchair/BSC Sit to Stand: Min guard Stand pivot transfers: Min assist         General transfer comment: pt asking for assistance of husband.  They generally use safe techique with pt's husband helping with HHA and likely doing a bit much.  Though is safe.    Ambulation/Gait Ambulation/Gait assistance: Min assist Gait Distance (Feet): 6 Feet (x2 to/from sink to brush teeth.) Assistive device: 1 person hand held assist Gait Pattern/deviations: Step-to pattern;Decreased step length - right;Decreased step length - left;Decreased stride length   Gait velocity interpretation: <1.31 ft/sec, indicative of household ambulator   General Gait Details: very short, guarded steps with minimal HHA for safety.   Stairs             Wheelchair Mobility    Modified Rankin (Stroke Patients Only)       Balance Overall balance assessment: Needs assistance Sitting-balance support: Feet supported;Single extremity supported;No upper extremity supported Sitting balance-Leahy Scale: Fair     Standing balance support: Single extremity supported;No upper extremity supported;During functional activity Standing balance-Leahy Scale: Fair Standing balance comment: at sink able to let go of stationary surface to close toothpaste etc.                            Cognition Arousal/Alertness: Awake/alert Behavior During Therapy: Flat affect Overall Cognitive Status: Within Functional Limits for tasks assessed  Exercises      General Comments General comments (skin integrity, edema, etc.): pt up at least 25 min between toileting on BSC and brushing her teeth.  HR initially at 130bpm and with activity ranging from upper 130's to 150 bpm,  SpO2 upper 90's.  BP 144/80's.  Husband was primary assist through out.      Pertinent Vitals/Pain Pain Assessment:  Faces Faces Pain Scale: Hurts even more Pain Location: abdomen Pain Descriptors / Indicators: Discomfort;Sore Pain Intervention(s): Monitored during session    Home Living                          Prior Function            PT Goals (current goals can now be found in the care plan section) Acute Rehab PT Goals Patient Stated Goal: rest, feel better PT Goal Formulation: With patient/family Time For Goal Achievement: 04/23/21 Potential to Achieve Goals: Good Progress towards PT goals: Progressing toward goals    Frequency    Min 3X/week      PT Plan Current plan remains appropriate    Co-evaluation              AM-PAC PT "6 Clicks" Mobility   Outcome Measure  Help needed turning from your back to your side while in a flat bed without using bedrails?: A Little Help needed moving from lying on your back to sitting on the side of a flat bed without using bedrails?: A Little Help needed moving to and from a bed to a chair (including a wheelchair)?: A Little Help needed standing up from a chair using your arms (e.g., wheelchair or bedside chair)?: A Little Help needed to walk in hospital room?: A Little Help needed climbing 3-5 steps with a railing? : A Little 6 Click Score: 18    End of Session   Activity Tolerance: Patient limited by fatigue;Patient limited by pain;Patient tolerated treatment well Patient left: with call bell/phone within reach;in bed;Other (comment);with family/visitor present (sitting EOB) Nurse Communication: Mobility status PT Visit Diagnosis: Other abnormalities of gait and mobility (R26.89);Pain Pain - part of body:  (abdomina)     Time: 2202-5427 PT Time Calculation (min) (ACUTE ONLY): 36 min  Charges:  $Gait Training: 8-22 mins $Therapeutic Activity: 8-22 mins                     04/11/2021  Ginger Carne., PT Acute Rehabilitation Services [phone]  (pager) [phone]  (office)   Tessie Fass Kinnie Kaupp 04/11/2021, 12:49  PM

## 2021-04-11 NOTE — Progress Notes (Addendum)
Stopped by to check on the patient and do an abdominal exam due to post-op fever and tachycardia. She was mobilizing to the commode with help from her husband. Reports her abdominal pain is not worse- has some discomfort with movement, especially around her GJ tube.  Normotensive with a HR of 130-133 bpm during my exam while up and moving, sinus rhythm on monitor  Abdomen is soft, non-distended, and appropriately tender, there is about 200 cc bilious contents in G tube gravity bag. No rebound tenderness or peritonitis.   No emergent surgical needs this afternoon.  Will also continue hold off on any repeat abdominal imaging for now. Dr. Zenia Resides to see the patient in the morning.   Blood Cx pending. EKG ordered by Dr. Zenia Resides this morning has not been done. Will re-order.   Obie Dredge, PA-C [facility] Please see Amion for pager number during day hours 7:00am-4:30pm

## 2021-04-11 NOTE — Significant Event (Addendum)
Rapid Response Event Note   Reason for Call :  MEWS-5 for fever, tachycardia.  Pt HR has had ongoing tachycardia (100-130s) since surgery. Pt has been afebrile until this AM.   Initial Focused Assessment:  Pt lying in bed with eyes open. She is alert and oriented per husband at bedside. She is c/o chest pain/pressure and ABD pain. Lungs diminished t/o. ABD tender/soft. L GJ tube site unremarkable. Skin hot to touch.  T-102.5, HR-148, BP-124/70, RR-19, SpO2-96% on RA   Interventions:  PCXR-ordered PTA RRT: "There are asymmetric coarse markings in the left base and small left effusion. Findings could be due to atelectasis or a combination of atelectasis and pneumonia." EKG-ST LA CBC/CMP/MG/Phos/trig(AML drawn with LA) Dilaudid 1mg  IV(prn order) 1L NS, BC x 2 Tx to PCU for closer monitoring  Plan of Care:  Cardizem has been held d/t previous hypotension. Pt is in pain and a has fever. Any of these factors along with others could attribute to her increased tachycardia. Give 1L bolus. Monitor temp. Await lab results and relay to MD. Will move to 4E PCU for closer monitoring. Continue to monitor pt closely. Call RRT if further assistance needed.    Event Summary:   MD Notified: Dr. Zenia Resides notified PTA RRT and came to bedside.  Call [place] End Time:0400  Dillard Essex, RN

## 2021-04-11 NOTE — Progress Notes (Signed)
Rapid response called, MEWS of 5, Mindy RN came, EKG and labs done, per Dr. Zenia Resides to transfer to higher level of care, bolus of 1000 ml Normal saline ordered and given.     04/11/21 0304  Assess: MEWS Score  Temp (!) 102.5 F (39.2 C)  BP 124/70  Pulse Rate (!) 148  Resp 18  SpO2 96 %  O2 Device Room Air  Assess: MEWS Score  MEWS Temp 2  MEWS Systolic 0  MEWS Pulse 3  MEWS RR 0  MEWS LOC 0  MEWS Score 5  MEWS Score Color Red  Assess: if the MEWS score is Yellow or Red  Were vital signs taken at a resting state? Yes  Focused Assessment Change from prior assessment (see assessment flowsheet)  Does the patient meet 2 or more of the SIRS criteria? Yes  Early Detection of Sepsis Score *See Row Information* High  MEWS guidelines implemented *See Row Information* No, previously yellow, continue vital signs every 4 hours  Treat  MEWS Interventions Administered scheduled meds/treatments  Take Vital Signs  Increase Vital Sign Frequency  Red: Q 1hr X 4 then Q 4hr X 4, if remains red, continue Q 4hrs  Escalate  MEWS: Escalate Red: discuss with charge nurse/RN and provider, consider discussing with RRT  Notify: Charge Nurse/RN  Name of Charge Nurse/RN Notified nyche  Date Charge Nurse/RN Notified 04/11/21  Time Charge Nurse/RN Notified 0305  Notify: Provider  Provider Name/Title dr Zenia Resides  Date Provider Notified 04/11/21  Time Provider Notified [phone]  Notification Type Page  Notification Reason Other (Comment) (rapid response was called)  Provider response See new orders  Date of Provider Response 04/11/21  Time of Provider Response 0357  Notify: Rapid Response  Name of Rapid Response RN Notified mindy RN  Date Rapid Response Notified 04/11/21  Time Rapid Response Notified 0306  Document  Patient Outcome Transferred/level of care increased  Progress note created (see row info) Yes

## 2021-04-11 NOTE — Progress Notes (Signed)
PHARMACY - TOTAL PARENTERAL NUTRITION CONSULT NOTE  Indication: Duodenal obstruction / SBO  Patient Measurements: Height: 5\' 1"  (154.9 cm) Weight: 51.9 kg (114 lb 6.4 oz) IBW/kg (Calculated) : 47.8 TPN AdjBW (KG): 51.9 Body mass index is 21.62 kg/m.  Assessment:  67 YOM admitted in Dec 2023 for management of pancreatic mass; patient declined TPN during that admission and left AMA.  She is admitted 1/12 for duodenal obstruction secondary to a pancreatic mass. PMH significant for HTN, GERD, DM and arrhythmias.  Patient has reduced intake due to nausea and vomiting and has been losing weight since November 2022 (~11 lb loss).  It has been >7 days since she was able to eat a meal. She is at high risk for refeeding syndrome. Pharmacy consulted to manage TPN.   Glucose / Insulin: hx DM, A1c 6.4%.  CBGs mostly controlled. Used 14 units SSI in past 24 hrs Electrolytes: Na 131, Mag up to 1.7 post 2gm, Phos 3.1 post 18mmol, others WNL Renal: AKI resolved - SCr down to < 1, BUN WNL Hepatic: LFTs / Tbili / TG WNL, prealbumin 28, albumin 2.4 Intake / Output; MIVF: UOP not charted, NS at 55 ml/hr, Gtube to gravity - 533mL GI Imaging: N/A GI Surgeries / Procedures:  1/13: ex-lap with pancreatic mass bx, antecolic GJ bypass, GJ tube placement   Central access: double lumen PICC 04/07/21 TPN start date: 04/08/21  Nutritional Goals:  RD Estimated Needs Total Energy Estimated Needs: 1600-1800 Total Protein Estimated Needs: 80-95 grams Total Fluid Estimated Needs: >/= 1.6 L Goal TPN rate 70 ml/hr (provides 87 g protein and 1724 kcal) Goal TF rate per RD:  Osmolite 1.5 at 45 ml/hr with BID Prosource  Current Nutrition:  TPN  Plan:  Osmolite 1.5 held 1/16 for infectious work-up  Increase TPN to goal rate of 70 ml/hr to provide 87g AA, 286g CHO and 40g ILE for a total of 1724 kCal, meeting 100% of patient's needs Electrolytes in TPN: increase Na to 170mEq/L, K 74mEq/L, Ca 30mEq/L, Mag 40mEq/L, Phos  43mmol/L, Cl:Ac 1:2 - all lytes increase with increased TPN rate Add standard MVI and trace elements to TPN Continue moderate SSI Q4H NS at 55 ml/hr per MD (not reducing with increased TPN rate given need for NS bolus) Mag sulfate 3gm IV x 1 Standard TPN labs on Mon/Thurs, labs in AM F/U with resuming TF when possible  Leah Pruitt, PharmD, BCPS, [place] 04/11/2021, 7:21 AM

## 2021-04-12 LAB — GLUCOSE, CAPILLARY
Glucose-Capillary: 100 mg/dL — ABNORMAL HIGH (ref 70–99)
Glucose-Capillary: 160 mg/dL — ABNORMAL HIGH (ref 70–99)
Glucose-Capillary: 190 mg/dL — ABNORMAL HIGH (ref 70–99)
Glucose-Capillary: 195 mg/dL — ABNORMAL HIGH (ref 70–99)
Glucose-Capillary: 224 mg/dL — ABNORMAL HIGH (ref 70–99)
Glucose-Capillary: 88 mg/dL (ref 70–99)

## 2021-04-12 LAB — BASIC METABOLIC PANEL
Anion gap: 7 (ref 5–15)
BUN: 11 mg/dL (ref 8–23)
CO2: 24 mmol/L (ref 22–32)
Calcium: 8.3 mg/dL — ABNORMAL LOW (ref 8.9–10.3)
Chloride: 103 mmol/L (ref 98–111)
Creatinine, Ser: 0.85 mg/dL (ref 0.44–1.00)
GFR, Estimated: >60 mL/min (ref >60)
Glucose, Bld: 94 mg/dL (ref 70–99)
Potassium: 4.1 mmol/L (ref 3.5–5.1)
Sodium: 134 mmol/L — ABNORMAL LOW (ref 135–145)

## 2021-04-12 LAB — PHOSPHORUS: Phosphorus: 3.9 mg/dL (ref 2.5–4.6)

## 2021-04-12 LAB — MAGNESIUM: Magnesium: 1.7 mg/dL (ref 1.7–2.4)

## 2021-04-12 LAB — SURGICAL PATHOLOGY

## 2021-04-12 MED ORDER — METHOCARBAMOL 1000 MG/10ML IJ SOLN
500.0000 mg | Freq: Three times a day (TID) | INTRAVENOUS | Status: DC
  Administered 2021-04-12 – 2021-04-19 (×22): 500 mg via INTRAVENOUS
  Filled 2021-04-12: qty 500
  Filled 2021-04-12 (×3): qty 5
  Filled 2021-04-12: qty 500
  Filled 2021-04-12 (×2): qty 5
  Filled 2021-04-12: qty 500
  Filled 2021-04-12: qty 5
  Filled 2021-04-12 (×3): qty 500
  Filled 2021-04-12: qty 5
  Filled 2021-04-12 (×4): qty 500
  Filled 2021-04-12: qty 5
  Filled 2021-04-12: qty 500
  Filled 2021-04-12 (×8): qty 5

## 2021-04-12 MED ORDER — TRAVASOL 10 % IV SOLN
INTRAVENOUS | Status: AC
  Filled 2021-04-12: qty 873.6

## 2021-04-12 MED ORDER — METOPROLOL TARTRATE 5 MG/5ML IV SOLN
5.0000 mg | INTRAVENOUS | Status: DC | PRN
  Administered 2021-04-12 – 2021-04-17 (×7): 5 mg via INTRAVENOUS
  Filled 2021-04-12 (×9): qty 5

## 2021-04-12 MED ORDER — METOPROLOL TARTRATE 5 MG/5ML IV SOLN
5.0000 mg | Freq: Four times a day (QID) | INTRAVENOUS | Status: DC | PRN
  Administered 2021-04-12: 5 mg via INTRAVENOUS
  Filled 2021-04-12: qty 5

## 2021-04-12 MED ORDER — MAGNESIUM SULFATE 2 GM/50ML IV SOLN
2.0000 g | Freq: Once | INTRAVENOUS | Status: AC
  Administered 2021-04-12: 2 g via INTRAVENOUS
  Filled 2021-04-12: qty 50

## 2021-04-12 MED ORDER — TRACE MINERALS CU-MN-SE-ZN 300-55-60-3000 MCG/ML IV SOLN
INTRAVENOUS | Status: AC
  Filled 2021-04-12: qty 576

## 2021-04-12 NOTE — Progress Notes (Signed)
PHARMACY - TOTAL PARENTERAL NUTRITION CONSULT NOTE  Indication: Duodenal obstruction / SBO  Patient Measurements: Height: 5\' 1"  (154.9 cm) Weight: 51.9 kg (114 lb 6.4 oz) IBW/kg (Calculated) : 47.8 TPN AdjBW (KG): 51.9 Body mass index is 21.62 kg/m.  Assessment:  24 YOM admitted in Dec 2023 for management of pancreatic mass; patient declined TPN during that admission and left AMA.  She is admitted 1/12 for duodenal obstruction secondary to a pancreatic mass. PMH significant for HTN, GERD, DM and arrhythmias.  Patient has reduced intake due to nausea and vomiting and has been losing weight since November 2022 (~11 lb loss).  It has been >7 days since she was able to eat a meal. She is at high risk for refeeding syndrome. Pharmacy consulted to manage TPN.   Glucose / Insulin: hx DM, A1c 6.4%.  CBGs mostly controlled. Used 14 units SSI in past 24 hrs Electrolytes: Na 134, Mag up to 1.7 post 2gm, Phos 3.9 post 8mmol, others WNL Renal: AKI resolved - SCr down to < 1, BUN WNL Hepatic: LFTs / Tbili / TG WNL, prealbumin 28, albumin 2.4 Intake / Output; MIVF: UOP not charted, Gtube to gravity - 252mL, volume overload per MD (net +7.8L) GI Imaging: N/A GI Surgeries / Procedures:  1/13: ex-lap with pancreatic mass bx, antecolic GJ bypass, GJ tube placement   Central access: double lumen PICC 04/07/21 TPN start date: 04/08/21  Nutritional Goals:  RD Estimated Needs Total Energy Estimated Needs: 1600-1800 Total Protein Estimated Needs: 80-95 grams Total Fluid Estimated Needs: >/= 1.6 L Goal TPN rate 70 ml/hr (provides 87 g protein and 1724 kcal) Goal TF rate per RD:  Osmolite 1.5 at 45 ml/hr with BID Prosource  Current Nutrition:  TPN 1/17 Holding TF for ileus  Plan:  Asked by Surgery to concentrate TPN for volume overload  Begin concentrated TPN at goal rate of 60 ml/hr to provide 86g AA, 288g CHO and 40g ILE for a total of 1728 kCal, meeting 100% of patient's needs Electrolytes in TPN:  Na 150mEq/L, K 60mEq/L, Ca 38mEq/L, Mag 47mEq/L, Phos 60mmol/L, Cl:Ac 1:2 - lytes similar to non concentrated TPN from 1/16 Add standard MVI and trace elements to TPN Continue moderate SSI Q4H Standard TPN labs on Mon/Thurs, labs in AM F/U with resuming TF when possible Liquid medications only via J tube, no crushed meds  Thank you for involving pharmacy in this patient's care.  Renold Genta, PharmD, BCPS Clinical Pharmacist Clinical phone for 04/12/2021 until 3p is [phone] 04/12/2021 7:25 AM  **Pharmacist phone directory can be found on [email or web address] listed under [place]**

## 2021-04-12 NOTE — Care Management Important Message (Signed)
Important Message  Patient Details  Name: Leah Pruitt MRN: [id number] Date of Birth: 07-15-1945   Medicare Important Message Given:  Yes     Shelda Altes 04/12/2021, 8:10 AM

## 2021-04-12 NOTE — Progress Notes (Signed)
4 Days Post-Op  Subjective: Patient remains tachycardic to 130s, as low as 115 at reset. No further fevers in last 24 hours. Resting comfortably this morning. No flatus or bowel movement. Ambulated with PT yesterday.   Objective: Vital signs in last 24 hours: Temp:  [97.5 F (36.4 C)-99.6 F (37.6 C)] 99.2 F (37.3 C) (01/17 0400) Pulse Rate:  [108-135] 112 (01/17 0400) Resp:  [14-20] 14 (01/17 0400) BP: (103-144)/(64-96) 103/69 (01/17 0400) SpO2:  [90 %-98 %] 90 % (01/17 0400) Last BM Date: 03/22/22  Intake/Output from previous day: 01/16 0701 - 01/17 0700 In: 3411.1 [I.V.:2051.1] Out: 200 [Drains:200] Intake/Output this shift: No intake/output data recorded.  PE: General: resting comfortably, NAD Neuro: alert and oriented, no focal deficits Resp: normal work of breathing on room air CV: tachycardic 130s, regular rhythm Abdomen: soft, mildly distended, GJ tube in place with G tube to gravity, draining bilious fluid. J tube capped. Abdominal wall edema noted. Extremities: bilateral lower extremity edema   Lab Results:  Recent Labs    04/10/21 0416 04/10/21 1123 04/11/21 0345  WBC 10.9*  --  8.8  HGB 8.6* 8.9* 8.3*  HCT 25.8* 26.5* 24.7*  PLT 145*  --  136*   BMET Recent Labs    04/11/21 0345 04/12/21 0528  NA 131* 134*  K 3.8 4.1  CL 104 103  CO2 23 24  GLUCOSE 164* 94  BUN 13 11  CREATININE 0.87 0.85  CALCIUM 7.8* 8.3*   PT/INR No results for input(s): LABPROT, INR in the last 72 hours.  CMP     Component Value Date/Time   NA 134 (L) 04/12/2021 0528   NA 133 (A) 03/31/2021 0000   K 4.1 04/12/2021 0528   CL 103 04/12/2021 0528   CO2 24 04/12/2021 0528   GLUCOSE 94 04/12/2021 0528   BUN 11 04/12/2021 0528   BUN 17 03/31/2021 0000   CREATININE 0.85 04/12/2021 0528   CALCIUM 8.3 (L) 04/12/2021 0528   PROT 4.8 (L) 04/11/2021 0345   ALBUMIN 2.4 (L) 04/11/2021 0345   AST 19 04/11/2021 0345   ALT 10 04/11/2021 0345   ALKPHOS 65 04/11/2021  0345   BILITOT 0.2 (L) 04/11/2021 0345   GFRNONAA >60 04/12/2021 0528   GFRAA >60 10/11/2016 1100   Lipase     Component Value Date/Time   LIPASE 20 04/11/2021 0500       Studies/Results: DG CHEST PORT 1 VIEW  Result Date: 04/11/2021 CLINICAL DATA:  Encounter for fever. EXAM: PORTABLE CHEST 1 VIEW COMPARISON:  Portable chest 03/24/2008. FINDINGS: The lungs are expiratory. Small left pleural effusion is present and overlying coarse linear markings which could be atelectasis or atelectasis intermixed with pneumonic infiltrate. There are atelectatic bands in the right perihilar area and in the hypoinflated right base. The remainder of the lungs appear clear. There is mild cardiomegaly, normal central vasculature, stable mediastinum with aortic tortuosity arch calcifications. Right PICC terminates in the upper right atrium. Osteopenia and thoracic spondylosis. IMPRESSION: 1. Limited expiratory study. There are asymmetric coarse markings in the left base and small left effusion. Findings could be due to atelectasis or a combination of atelectasis and pneumonia. PA and lateral study in full inspiration recommended. 2. Right PICC tip in the upper right atrium. 3. Aortic atherosclerosis. Electronically Signed   By: Telford Nab M.D.   On: 04/11/2021 03:07       Assessment/Plan  76 yo female with infiltrative pancreatic body mass causing duodenal obstruction and left  hydronephrosis. POD4 s/p GJ bypass, placement of GJ feeding tube, and biopsy of pancreatic mass. - Ileus: hold tube feeds, keep G tube to gravity drainage until return of bowel function. - Tachycardia: On home dose of diltiazem, may not be absorbing due to ileus. Begin metoprolol 5mg  IV q6h prn. - Continue TPN, will discuss concentrating this with pharmacy as patient is edematous on exam. Stop maintenance fluids. - VTE: lovenox, SCDs - Ambulate, PT ordered - Dispo: progressive care   LOS: 5 days    Michaelle Birks, MD [facility] Surgery General, Hepatobiliary and Pancreatic Surgery 04/12/21 7:37 AM

## 2021-04-13 ENCOUNTER — Inpatient Hospital Stay (HOSPITAL_COMMUNITY): Payer: Medicare Other

## 2021-04-13 LAB — MAGNESIUM: Magnesium: 1.8 mg/dL (ref 1.7–2.4)

## 2021-04-13 LAB — CBC
HCT: 24.1 % — ABNORMAL LOW (ref 36.0–46.0)
Hemoglobin: 7.8 g/dL — ABNORMAL LOW (ref 12.0–15.0)
MCH: 28.6 pg (ref 26.0–34.0)
MCHC: 32.4 g/dL (ref 30.0–36.0)
MCV: 88.3 fL (ref 80.0–100.0)
Platelets: 154 10*3/uL (ref 150–400)
RBC: 2.73 MIL/uL — ABNORMAL LOW (ref 3.87–5.11)
RDW: 13.7 % (ref 11.5–15.5)
WBC: 6.3 10*3/uL (ref 4.0–10.5)
nRBC: 0 % (ref 0.0–0.2)

## 2021-04-13 LAB — BASIC METABOLIC PANEL
Anion gap: 7 (ref 5–15)
BUN: 15 mg/dL (ref 8–23)
CO2: 27 mmol/L (ref 22–32)
Calcium: 8.1 mg/dL — ABNORMAL LOW (ref 8.9–10.3)
Chloride: 100 mmol/L (ref 98–111)
Creatinine, Ser: 0.73 mg/dL (ref 0.44–1.00)
GFR, Estimated: >60 mL/min (ref >60)
Glucose, Bld: 155 mg/dL — ABNORMAL HIGH (ref 70–99)
Potassium: 3.9 mmol/L (ref 3.5–5.1)
Sodium: 134 mmol/L — ABNORMAL LOW (ref 135–145)

## 2021-04-13 LAB — GLUCOSE, CAPILLARY
Glucose-Capillary: 140 mg/dL — ABNORMAL HIGH (ref 70–99)
Glucose-Capillary: 148 mg/dL — ABNORMAL HIGH (ref 70–99)
Glucose-Capillary: 154 mg/dL — ABNORMAL HIGH (ref 70–99)
Glucose-Capillary: 171 mg/dL — ABNORMAL HIGH (ref 70–99)
Glucose-Capillary: 189 mg/dL — ABNORMAL HIGH (ref 70–99)
Glucose-Capillary: 207 mg/dL — ABNORMAL HIGH (ref 70–99)

## 2021-04-13 IMAGING — DX DG ABDOMEN 1V
1 series · 1 of 1 positions shown · non-contrast
Comparison: [DATE] CT abdomen

CLINICAL DATA: Gastrostomy tube assessment

EXAM:
ABDOMEN - 1 VIEW

[abdomen supine]
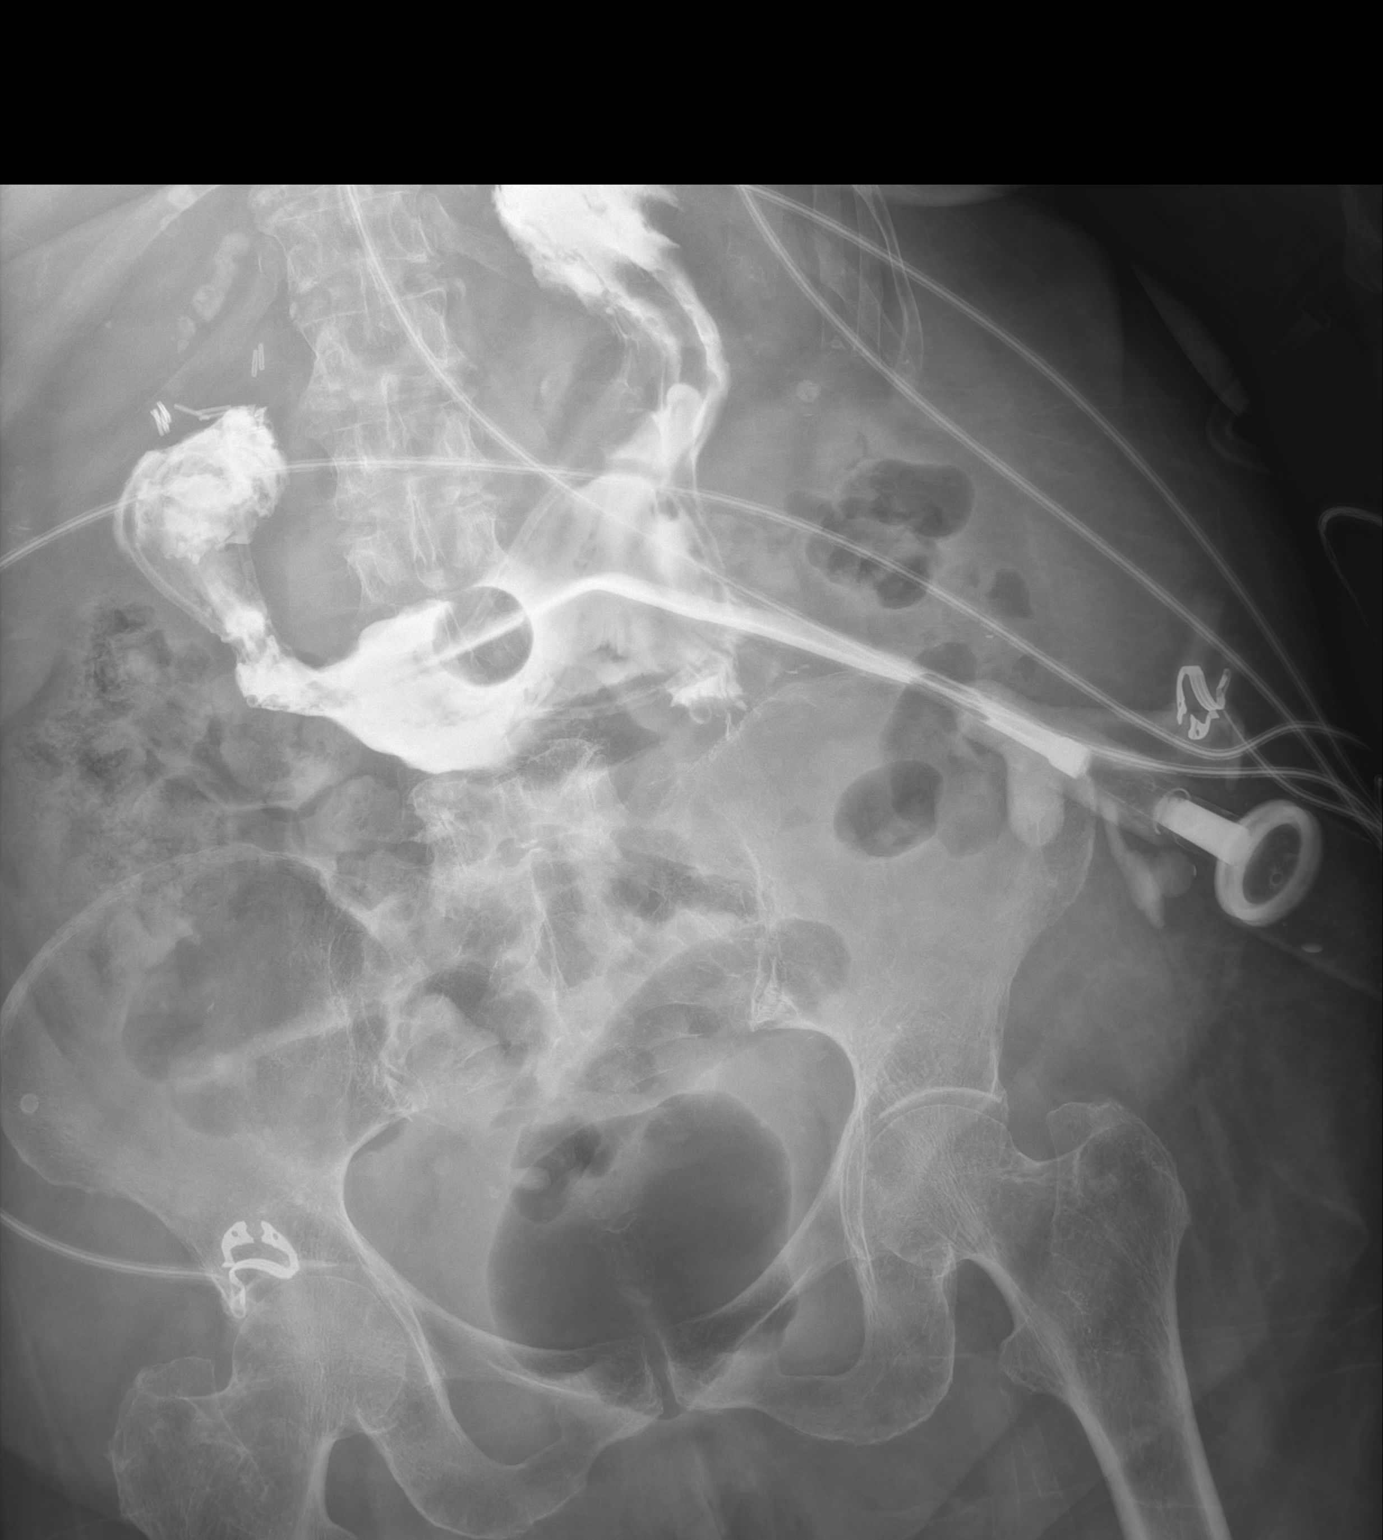

[1 of 1 positions shown; findings below may reference images not displayed]

FINDINGS: The gastrostomy tube was reportedly injected with 30 cc of
Gastrografin. The radiograph shows contrast in the tube and in the
stomach, without unexpected extension around the tube. A retention
balloon in the stomach forms a filling defect. There is some
additional tubing projecting over the stomach. A small amount of
contrast along the inferior margin of the greater curvature of the
stomach could reflect early contrast extension into jejunum via the
gastrojejunostomy.
IMPRESSION: 1. Contrast injected through the gastric port fills the stomach.
Questionable early transit contrast from the stomach into the
jejunum via the gastrojejunostomy.
2. There is also some coiled tubing over the stomach well beyond the
retention balloon, some of this may in part reflect the reported
gastro jejunal extension.

## 2021-04-13 MED ORDER — VITAL HIGH PROTEIN PO LIQD: 1000.0000 mL | ORAL | Status: DC

## 2021-04-13 MED ORDER — OSMOLITE 1.5 CAL PO LIQD: 1000.0000 mL | ORAL | Status: DC

## 2021-04-13 MED ORDER — TRACE MINERALS CU-MN-SE-ZN 300-55-60-3000 MCG/ML IV SOLN
INTRAVENOUS | Status: AC
  Filled 2021-04-13: qty 288

## 2021-04-13 MED ORDER — DIATRIZOATE MEGLUMINE & SODIUM 66-10 % PO SOLN
ORAL | Status: AC
  Administered 2021-04-13: 30 mL via GASTROSTOMY
  Filled 2021-04-13: qty 30

## 2021-04-13 MED ORDER — TRACE MINERALS CU-MN-SE-ZN 300-55-60-3000 MCG/ML IV SOLN
INTRAVENOUS | Status: DC
  Filled 2021-04-13: qty 576

## 2021-04-13 MED ORDER — POTASSIUM CHLORIDE 10 MEQ/50ML IV SOLN
10.0000 meq | Freq: Once | INTRAVENOUS | Status: AC
  Administered 2021-04-13: 10 meq via INTRAVENOUS
  Filled 2021-04-13: qty 50

## 2021-04-13 MED ORDER — OSMOLITE 1.5 CAL PO LIQD
1000.0000 mL | ORAL | Status: DC
  Administered 2021-04-13: 1000 mL
  Filled 2021-04-13: qty 1000

## 2021-04-13 MED ORDER — MAGNESIUM SULFATE 2 GM/50ML IV SOLN
2.0000 g | Freq: Once | INTRAVENOUS | Status: AC
  Administered 2021-04-13: 2 g via INTRAVENOUS
  Filled 2021-04-13: qty 50

## 2021-04-13 MED ORDER — INSULIN ASPART 100 UNIT/ML IJ SOLN
0.0000 [IU] | INTRAMUSCULAR | Status: AC
  Administered 2021-04-13 (×2): 4 [IU] via SUBCUTANEOUS
  Administered 2021-04-13: 3 [IU] via SUBCUTANEOUS
  Administered 2021-04-14 (×3): 4 [IU] via SUBCUTANEOUS
  Administered 2021-04-14: 3 [IU] via SUBCUTANEOUS
  Administered 2021-04-14: 4 [IU] via SUBCUTANEOUS

## 2021-04-13 MED ORDER — OSMOLITE 1.5 CAL PO LIQD
1000.0000 mL | ORAL | Status: DC
  Administered 2021-04-13 – 2021-04-14 (×2): 1000 mL via JEJUNOSTOMY
  Filled 2021-04-13 (×4): qty 1000

## 2021-04-13 NOTE — Progress Notes (Signed)
Patient H.R. goes to 130-140's with any activity. Resting 110-118 S.T. Just talking H.R. goes up 120 S.T. but does not stay. Cont. To monitor patient and rhythm.

## 2021-04-13 NOTE — Progress Notes (Signed)
° ° °  5 Days Post-Op  Subjective: Patient remains afebrile. Tachycardia improved with metoprolol. Normotensive. Passing flatus. Has some pain and redness around [place] tube. Ambulated in room yesterday.   Objective: Vital signs in last 24 hours: Temp:  [98.6 F (37 C)-99.3 F (37.4 C)] 98.7 F (37.1 C) (01/18 0800) Pulse Rate:  [120-137] 135 (01/18 0800) Resp:  [14-20] 18 (01/18 0800) BP: (115-139)/(69-96) 128/96 (01/18 0800) SpO2:  [94 %-97 %] 94 % (01/18 0800) Weight:  [54.3 kg] 54.3 kg (01/18 0400) Last BM Date:  (unk)  Intake/Output from previous day: 01/17 0701 - 01/18 0700 In: 940 [I.V.:720; IV Piggyback:100] Out: 700 [Urine:600; Drains:100] Intake/Output this shift: No intake/output data recorded.  PE: General: resting comfortably, NAD Neuro: alert and oriented, no focal deficits Resp: normal work of breathing on room air CV: tachycardic 110s, regular rhythm Abdomen: soft, distension improved, GJ tube in place with G tube to gravity, draining minimal bilious fluid. There is mild erythema and induration surrounding the G tube. Extremities: bilateral lower extremity edema   Lab Results:  Recent Labs    04/11/21 0345 04/13/21 0344  WBC 8.8 6.3  HGB 8.3* 7.8*  HCT 24.7* 24.1*  PLT 136* 154   BMET Recent Labs    04/12/21 0528 04/13/21 0344  NA 134* 134*  K 4.1 3.9  CL 103 100  CO2 24 27  GLUCOSE 94 155*  BUN 11 15  CREATININE 0.85 0.73  CALCIUM 8.3* 8.1*   PT/INR No results for input(s): LABPROT, INR in the last 72 hours.  CMP     Component Value Date/Time   NA 134 (L) 04/13/2021 0344   NA 133 (A) 03/31/2021 0000   K 3.9 04/13/2021 0344   CL 100 04/13/2021 0344   CO2 27 04/13/2021 0344   GLUCOSE 155 (H) 04/13/2021 0344   BUN 15 04/13/2021 0344   BUN 17 03/31/2021 0000   CREATININE 0.73 04/13/2021 0344   CALCIUM 8.1 (L) 04/13/2021 0344   PROT 4.8 (L) 04/11/2021 0345   ALBUMIN 2.4 (L) 04/11/2021 0345   AST 19 04/11/2021 0345   ALT 10 04/11/2021  0345   ALKPHOS 65 04/11/2021 0345   BILITOT 0.2 (L) 04/11/2021 0345   GFRNONAA >60 04/13/2021 0344   GFRAA >60 10/11/2016 1100   Lipase     Component Value Date/Time   LIPASE 20 04/11/2021 0500       Studies/Results: No results found.     Assessment/Plan  76 yo female with infiltrative pancreatic body mass causing duodenal obstruction and left hydronephrosis. POD5 s/p GJ bypass, placement of GJ feeding tube, and biopsy of pancreatic mass. - Intraop biopsy confirms pancreatic adenocarcinoma. I discussed this with the patient and her husband at bedside today. I will refer them to medical oncology at discharge to discuss neoadjuvant chemotherapy. We can revisit surgery at a later today after the first few cycles of chemotherapy. - Will get G tube study today to confirm that gastric port has not been retracted into the abdominal wall, given the surrounding erythema and decreased output.  - Begin J tube feeds at 59ml/hr and advance every 6 hours - Wean TPN to 1/2 rate tonight - Tachycardia: Continue home diltiazem and prn metoprolol for HR>120 (has only required 1 dose metoprolol in last 12 hours). - VTE: lovenox, SCDs - Ambulate, PT ordered - Dispo: progressive care   LOS: 6 days    Michaelle Birks, MD [facility] Surgery General, Hepatobiliary and Pancreatic Surgery 04/13/21 10:27 AM

## 2021-04-13 NOTE — Progress Notes (Signed)
PT Cancellation Note  Patient Details Name: Leah Pruitt MRN: [id number] DOB: 10-24-1945   Cancelled Treatment:    Reason Eval/Treat Not Completed: Other (comment). Pt dizzy and nauseated from just having contrast for xray. Pt and RN asked for PT to wait until nausea under control as pt just received medicine and she doesn't want her vomiting. PT to return as able to progress mobility.  Kittie Plater, PT, DPT Acute Rehabilitation Services Pager #: [phone] Office #: [phone]     Berline Lopes 04/13/2021, 11:53 AM

## 2021-04-13 NOTE — Progress Notes (Addendum)
PHARMACY - TOTAL PARENTERAL NUTRITION CONSULT NOTE  Indication: Duodenal obstruction / SBO / ileus  Patient Measurements: Height: 5\' 1"  (154.9 cm) Weight: 54.3 kg (119 lb 11.4 oz) IBW/kg (Calculated) : 47.8 TPN AdjBW (KG): 51.9 Body mass index is 22.62 kg/m.  Assessment:  68 YOM admitted in Dec 2023 for management of pancreatic mass; patient declined TPN during that admission and left AMA.  She is admitted 1/12 for duodenal obstruction secondary to a pancreatic mass. PMH significant for HTN, GERD, DM and arrhythmias.  Patient has reduced intake due to nausea and vomiting and has been losing weight since November 2022 (~11 lb loss).  It has been >7 days since she was able to eat a meal. She is at high risk for refeeding syndrome. Pharmacy consulted to manage TPN.   1/17: refused TPN last night but was hung ~10:30 this am  Glucose / Insulin: hx DM, A1c 6.4%.  CBGs  uncontrolled at 190-200s once TPN restarted. Used 17 units SSI in past 24 hrs Electrolytes: Na 134, K 3.9 (goal >=4), Mag up to 1.8 post 2gm (goal >=2), Phos 3.9 post 31mmol, others WNL Renal: AKI resolved - SCr down to < 1, BUN WNL Hepatic: LFTs / Tbili / TG WNL, prealbumin 28, albumin 2.4 Intake / Output; MIVF: UOP 0.5 ml/kg/hr + 2 occurrences, Gtube to gravity - 165mL, volume overload per MD, net +8L (up slightly) GI Imaging: N/A GI Surgeries / Procedures:  1/13: ex-lap with pancreatic mass bx, antecolic GJ bypass, GJ tube placement   Central access: double lumen PICC 04/07/21 TPN start date: 04/08/21  Nutritional Goals:  RD Estimated Needs Total Energy Estimated Needs: 1600-1800 Total Protein Estimated Needs: 80-95 grams Total Fluid Estimated Needs: >/= 1.6 L Goal TPN rate 70 ml/hr (provides 87 g protein and 1724 kcal) Goal TF rate per RD:  Osmolite 1.5 at 45 ml/hr with BID Prosource  Current Nutrition:  TPN  1/17 Holding TF for ileus  Plan:  Asked by Surgery to concentrate TPN for volume overload  Begin  concentrated TPN at goal rate of 60 ml/hr to provide 86g AA, 288g CHO and 40g ILE for a total of 1728 kCal, meeting 100% of patient's needs Electrolytes in TPN: Na 175mEq/L, increase K 56mEq/L, Ca 16mEq/L, increase Mag 65mEq/L, Phos 68mmol/L, Cl:Ac 1:2  Add standard MVI and trace elements to TPN Increase to resistant SSI Q4H - may need to add insulin to TPN Standard TPN labs on Mon/Thurs, labs in AM F/U with resuming TF when possible Liquid medications only via J tube, no crushed meds  Mag 2 g IV x1 KCl 10 meq IV x1  Thank you for involving pharmacy in this patient's care.  Renold Genta, PharmD, BCPS Clinical Pharmacist Clinical phone for 04/13/2021 until 3p is [phone] 04/13/2021 7:20 AM  **Pharmacist phone directory can be found on [email or web address] listed under [place]**   Update: TF to be resumed today. Surgery requested half rate TPN for tonight. Will update order.  Renold Genta, PharmD, BCPS 10:16 AM

## 2021-04-13 NOTE — Progress Notes (Signed)
Patient heart rate staying in the 120-130's. With no complaints. Gave PRN dose of Lopressor 5 mg I.V. slowly H.R came down to S.R. 98 to S.T. 110 .cont. To monitor patient and rhythm

## 2021-04-13 NOTE — Progress Notes (Signed)
Nutrition Follow-up  DOCUMENTATION CODES:   Severe malnutrition in context of acute illness/injury  INTERVENTION:   Resume TF via J port of GJ tube: Osmolite 1.5 at 25 ml/h, increase by 10 ml every 6 hours to goal rate of 45 ml/h (1080 ml per day) Prosource TF 45 ml BID  Provides 1700 kcal, 90 gm protein, 823 ml free water daily  TPN at half rate with today's bag per Surgery.  NUTRITION DIAGNOSIS:   Severe Malnutrition related to acute illness (pancreatic mass) as evidenced by mild fat depletion, moderate muscle depletion, percent weight loss.  Ongoing   GOAL:   Patient will meet greater than or equal to 90% of their needs  Progressing with TPN and TF  MONITOR:   Labs, Weight trends, I & O's  REASON FOR ASSESSMENT:   Consult New TPN/TNA  ASSESSMENT:   76 y.o. female presented with a duodenal obstruction due to pancreatic mass. Recently admitted in December and left AMA. PMH includes T2DM, HTN, and GERD. Pt admitted for EUS and gastrojejunal bypass.  1/13 - S/P exploratory laparotomy with biopsy of pancreatic mass and placement of GJ tube. Biopsy confirmed pancreatic adenocarcinoma.  TF has been on hold d/t ileus. G port to gravity drainage. 100 ml output x 24 hours.  S/P G-tube study with contrast this morning to verify position of gastric port.  Patient is complaining of abdominal pain from the contrast and asked RN and RD if tube feeding could be held for a little while. Plans to resume TF at 25 ml/h this afternoon and advance every 6 hours.  TPN being weaned to half rate tonight to provide 864 kcal, 43 gm protein per day.  Remains NPO.  Labs reviewed. Na 134 CBG: [phone]  Medications reviewed and include Novolog, Protonix.  Diet Order:   Diet Order             Diet NPO time specified Except for: Sips with Meds, Ice Chips  Diet effective now                   EDUCATION NEEDS:   No education needs have been identified at this time  Skin:   Skin Assessment: Reviewed RN Assessment  Last BM:  unknown  Height:   Ht Readings from Last 1 Encounters:  04/07/21 5\' 1"  (1.549 m)    Weight:   Wt Readings from Last 1 Encounters:  04/13/21 54.3 kg    Ideal Body Weight:  47.7 kg  BMI:  Body mass index is 22.62 kg/m.  Estimated Nutritional Needs:   Kcal:  1600-1800  Protein:  80-95 grams  Fluid:  >/= 1.6 L    Lucas Mallow RD, LDN, CNSC Please refer to Amion for contact information.

## 2021-04-14 ENCOUNTER — Inpatient Hospital Stay (HOSPITAL_COMMUNITY): Payer: Medicare Other

## 2021-04-14 ENCOUNTER — Encounter: Payer: Medicare Other | Admitting: Gastroenterology

## 2021-04-14 ENCOUNTER — Other Ambulatory Visit: Payer: Self-pay

## 2021-04-14 LAB — COMPREHENSIVE METABOLIC PANEL
ALT: 14 U/L (ref 0–44)
AST: 17 U/L (ref 15–41)
Albumin: 2.2 g/dL — ABNORMAL LOW (ref 3.5–5.0)
Alkaline Phosphatase: 71 U/L (ref 38–126)
Anion gap: 7 (ref 5–15)
BUN: 15 mg/dL (ref 8–23)
CO2: 27 mmol/L (ref 22–32)
Calcium: 8.3 mg/dL — ABNORMAL LOW (ref 8.9–10.3)
Chloride: 102 mmol/L (ref 98–111)
Creatinine, Ser: 0.76 mg/dL (ref 0.44–1.00)
GFR, Estimated: >60 mL/min (ref >60)
Glucose, Bld: 188 mg/dL — ABNORMAL HIGH (ref 70–99)
Potassium: 4.4 mmol/L (ref 3.5–5.1)
Sodium: 136 mmol/L (ref 135–145)
Total Bilirubin: 0.4 mg/dL (ref 0.3–1.2)
Total Protein: 4.8 g/dL — ABNORMAL LOW (ref 6.5–8.1)

## 2021-04-14 LAB — GLUCOSE, CAPILLARY
Glucose-Capillary: 173 mg/dL — ABNORMAL HIGH (ref 70–99)
Glucose-Capillary: 180 mg/dL — ABNORMAL HIGH (ref 70–99)
Glucose-Capillary: 166 mg/dL — ABNORMAL HIGH (ref 70–99)
Glucose-Capillary: 156 mg/dL — ABNORMAL HIGH (ref 70–99)
Glucose-Capillary: 163 mg/dL — ABNORMAL HIGH (ref 70–99)

## 2021-04-14 LAB — MAGNESIUM: Magnesium: 2 mg/dL (ref 1.7–2.4)

## 2021-04-14 LAB — PHOSPHORUS: Phosphorus: 4.8 mg/dL — ABNORMAL HIGH (ref 2.5–4.6)

## 2021-04-14 IMAGING — CT CT ABD-PELV W/O CM
2 of 4 series · 15 of 46 positions shown, 17 images · non-contrast
Comparison: KUB [DATE] CT abdomen and pelvis [DATE]

CLINICAL DATA: Postoperative. Recent abdominal surgery. Abdominal
pain. Evaluate for fluid collection and confirm appropriate position
of J-tube.



[Series 3: a/p w/o 5mm · axial · non-contrast · 0.88mm/px · z∈[+781,+1141]mm · 12 of 83 slices shown, 14 images]
[im 7/83  soft-tissue]
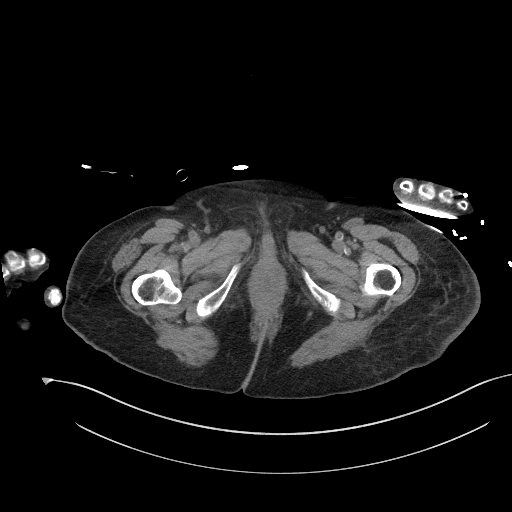
[im 7/83  bone]
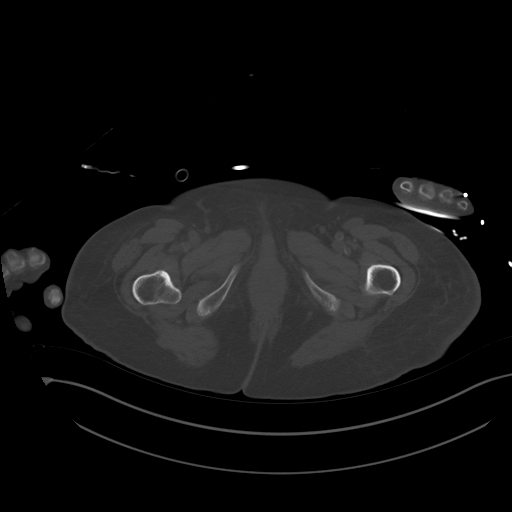
[im 14/83  soft-tissue]
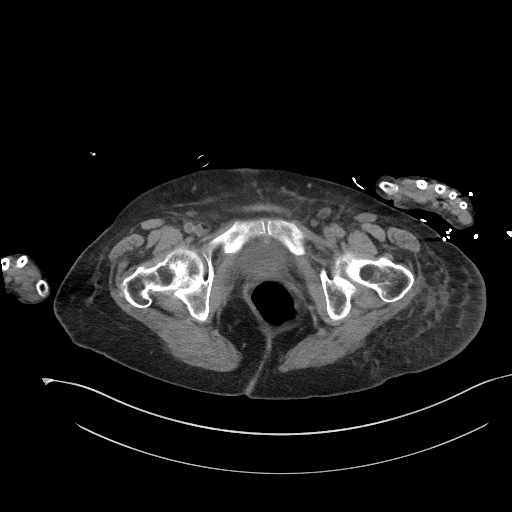
[im 20/83  soft-tissue]
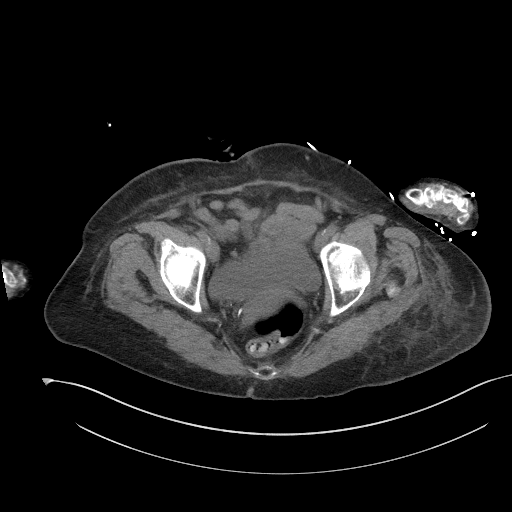
[im 27/83  soft-tissue]
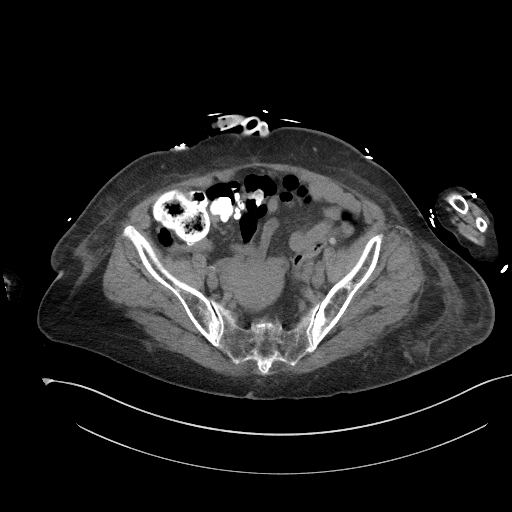
[im 33/83  soft-tissue]
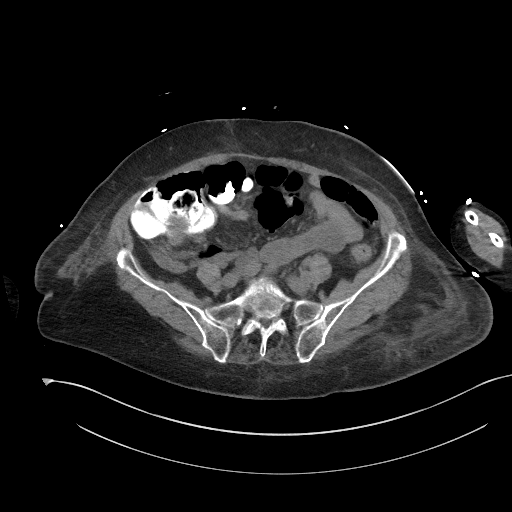
[im 40/83  soft-tissue]
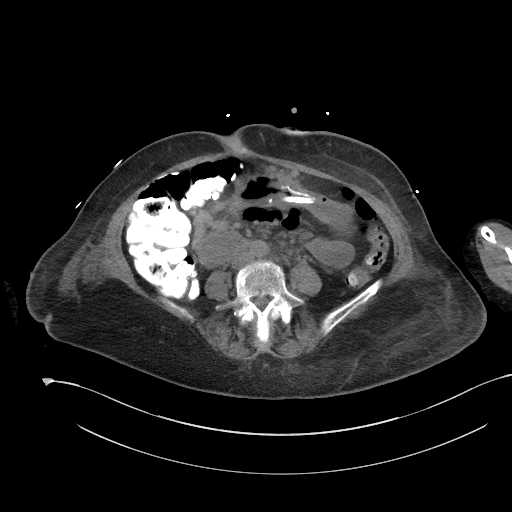
[im 46/83  soft-tissue]
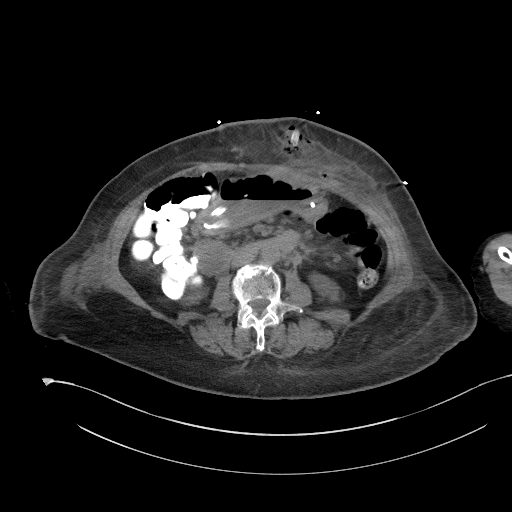
[im 53/83  soft-tissue]
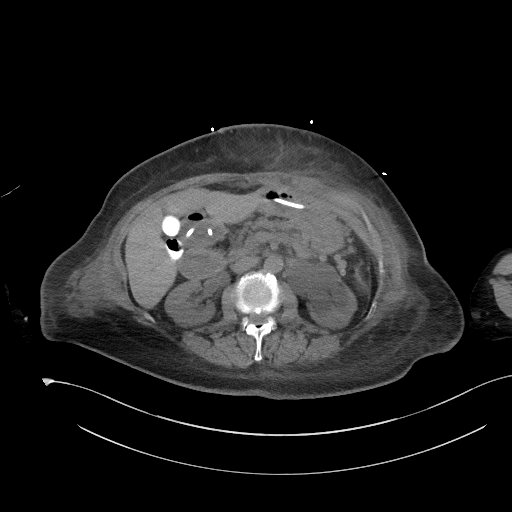
[im 60/83  soft-tissue]
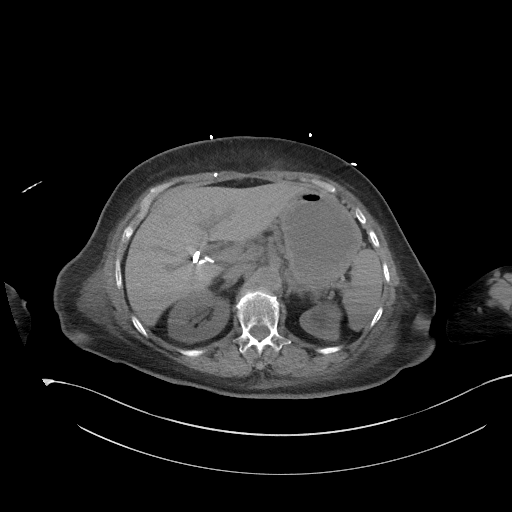
[im 60/83  bone]
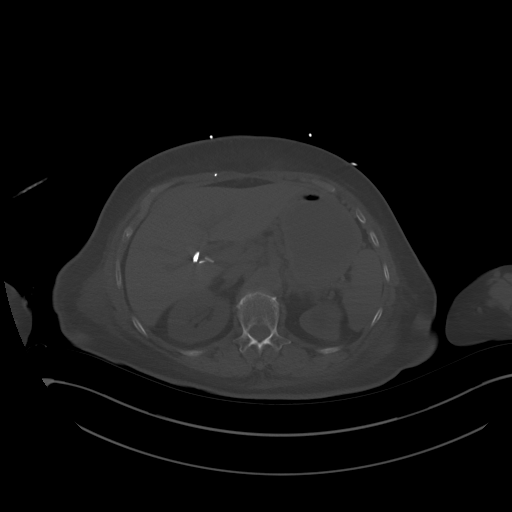
[im 66/83  soft-tissue]
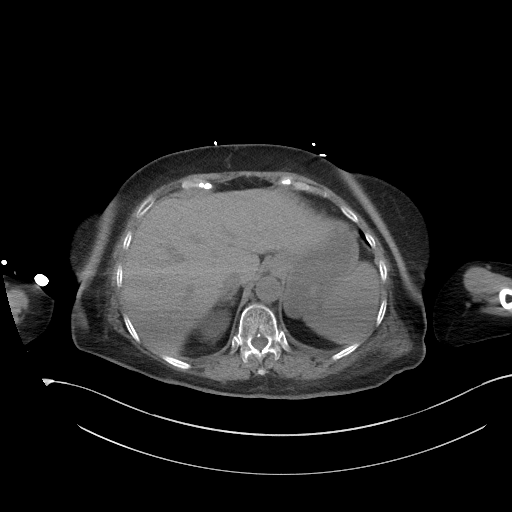
[im 73/83  soft-tissue]
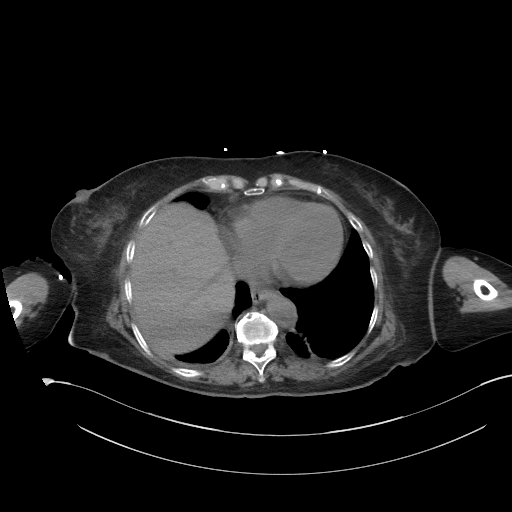
[im 79/83  soft-tissue]
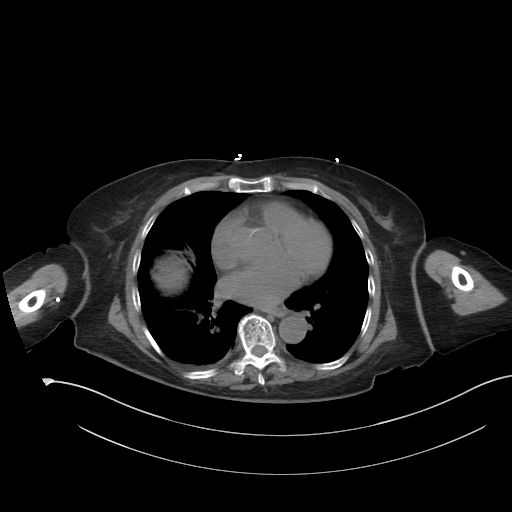

[Series 6: a/p w/o cor · coronal · non-contrast · 0.78mm/px · 3 of 120 slices shown]
[im 40/120  soft-tissue]
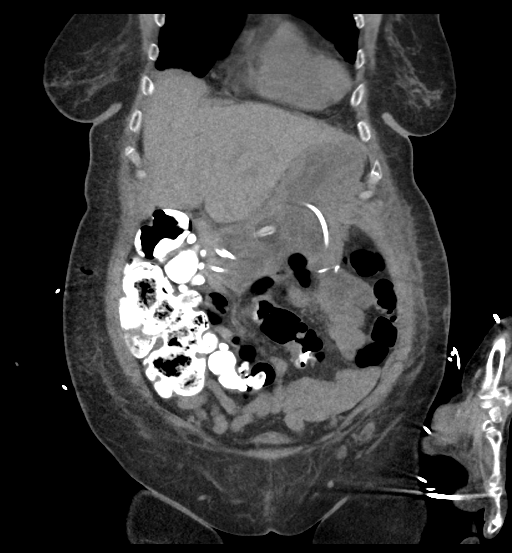
[im 53/120  soft-tissue]
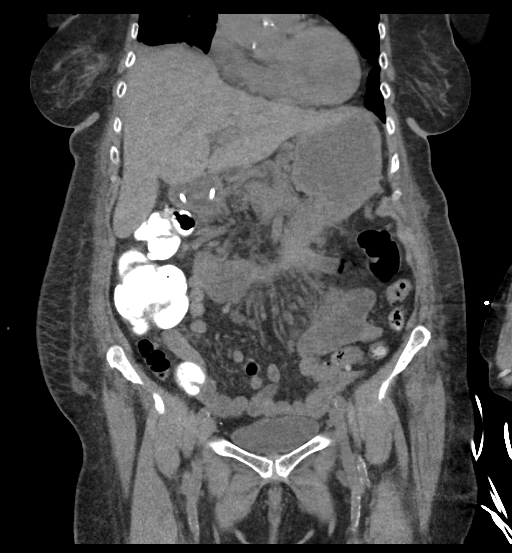
[im 67/120  soft-tissue]
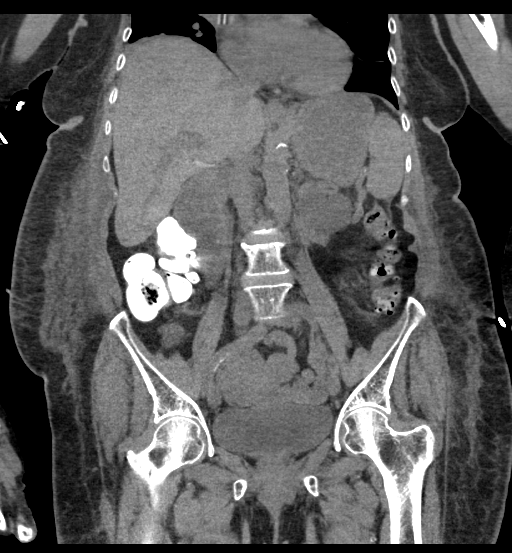

[15 of 46 positions shown; findings below may reference images not displayed]

FINDINGS: Lower chest: There is new curvilinear likely subsegmental
atelectasis within the bilateral lower lobes. Heart size is mildly
enlarged. Coronary artery and aortic root calcifications are again
seen. No pericardial effusion.

Lack of intra-articular fluid limits evaluation of the abdominal and
pelvic organ parenchyma. The following findings are made within this
limitation.

Hepatobiliary: Smooth liver contours. No gross liver lesion is seen.
Status post cholecystectomy.

Pancreas: There is again a soft tissue mass in the region of the
inferior aspect of the junction of the body and tail of the pancreas
(axial image 35, coronal image 60), measuring up to 4.8 x 4.1 x
cm (transverse by AP by craniocaudal), unchanged from [DATE]
when measured in a similar manner. Previously this was associated
with mass effect on the distal duodenum and proximal duodenal and
stomach distention which appears resolved following reported
interval gastrojejunostomy.

Spleen: Normal in size without focal abnormality.

Adrenals/Urinary Tract: Normal adrenals. There is again moderate to
high-grade left cortical atrophy. Unchanged severe left
hydronephrosis and mild right hydronephrosis. Within the limitations
of lack of IV contrast, no contour deforming renal mass. The urinary
bladder is grossly unremarkable.

Stomach/Bowel: A catheter from ventral abdominal approach is seen
with balloon within the stomach and the catheter tubing that appears
to be curl within the stomach, extending from the gastric body
through the duodenal bulb and with curling backwards into the
stomach body before terminating likely through an anastomosis of the
stomach and proximal jejunum (coronal image 36). There are small
foci of air seen just distal to the tip, likely within the proximal
jejunum (coronal image 36 and axial images 45 and 46). There are
scattered foci of air soft tissue swelling, and fat stranding within
the left ventral abdominal wall subcutaneous fat adjacent to the
course of the insertion of the gastrojejunostomy tube.
Mild-to-moderate distal colonic diverticulosis. Oral contrast is
seen as distal as the transverse colon. No dilated loops of bowel to
indicate bowel obstruction.

Vascular/Lymphatic: No abdominal aortic aneurysm. No enlarged
abdominal or pelvic lymph nodes.

Reproductive: The uterus is present and grossly unremarkable. No
adnexal masses.

Other: Small 1.5 cm focus of air and fluid just to the right of the
umbilicus within the ventral subcutaneous fat. This may represent a
prior laparoscopic port. No free air or free fluid.

Musculoskeletal: Grade 1 anterolisthesis of L5 on S1 with severe
L5-S1 facet joint degenerative changes.
IMPRESSION: 1. Redemonstration of soft tissue mass at the inferior aspect of the
junction of the body and posterior horn of the pancreas suspicious
for malignancy.
2. Interval gastrojejunostomy and interval placement of a
gastrojejunostomy tube with the tip terminating within the jejunum
just distal to the gastrojejunostomy anastomosis. Resolution of the
prior stomach and proximal duodenal dilatation from obstruction by
the pancreatic mass.
3. Unchanged severe left and mild right hydronephrosis.

## 2021-04-14 MED ORDER — TRACE MINERALS CU-MN-SE-ZN 300-55-60-3000 MCG/ML IV SOLN
INTRAVENOUS | Status: AC
  Filled 2021-04-14: qty 288

## 2021-04-14 MED ORDER — HYDROMORPHONE HCL 1 MG/ML IJ SOLN
0.5000 mg | INTRAMUSCULAR | Status: DC | PRN
  Administered 2021-04-14 (×3): 1 mg via INTRAVENOUS
  Filled 2021-04-14 (×4): qty 1

## 2021-04-14 MED ORDER — OXYCODONE HCL 5 MG/5ML PO SOLN
5.0000 mg | ORAL | Status: DC | PRN
  Administered 2021-04-14 – 2021-04-18 (×11): 10 mg
  Filled 2021-04-14 (×13): qty 10

## 2021-04-14 MED ORDER — HYDROMORPHONE HCL 1 MG/ML IJ SOLN
0.5000 mg | INTRAMUSCULAR | Status: DC | PRN
  Administered 2021-04-14 (×2): 1 mg via INTRAVENOUS
  Filled 2021-04-14 (×2): qty 1

## 2021-04-14 MED ORDER — CEFAZOLIN SODIUM-DEXTROSE 1-4 GM/50ML-% IV SOLN
1.0000 g | Freq: Three times a day (TID) | INTRAVENOUS | Status: DC
  Administered 2021-04-14 – 2021-04-19 (×15): 1 g via INTRAVENOUS
  Filled 2021-04-14 (×18): qty 50

## 2021-04-14 MED ORDER — INSULIN ASPART 100 UNIT/ML IJ SOLN
0.0000 [IU] | INTRAMUSCULAR | Status: DC
  Administered 2021-04-14: 3 [IU] via SUBCUTANEOUS
  Administered 2021-04-15: 5 [IU] via SUBCUTANEOUS
  Administered 2021-04-15: 2 [IU] via SUBCUTANEOUS
  Administered 2021-04-15: 5 [IU] via SUBCUTANEOUS
  Administered 2021-04-15 – 2021-04-16 (×2): 2 [IU] via SUBCUTANEOUS
  Administered 2021-04-16: 3 [IU] via SUBCUTANEOUS
  Administered 2021-04-16 (×2): 2 [IU] via SUBCUTANEOUS
  Administered 2021-04-17 (×2): 3 [IU] via SUBCUTANEOUS

## 2021-04-14 NOTE — Progress Notes (Signed)
Physical Therapy Treatment Patient Details Name: Leah Pruitt MRN: [id number] DOB: [birth year] Today's Date: 04/14/2021   History of Present Illness Pt is a 76 y.o. female admitted 04/07/21 with adenocarcinoma of pancreas causing duodenal obstruction and left hydronephrosis. S/p ex-lap, pancreatic mass biopsy, antecolic bypass, GJ tube placement 1/13. PMH includes appendectomy, cholecystectomy, chronic constipation, DM2, HTN, migraines; of note, admission several weeks ago for NGT decompression given dueodenal obstrcution and large pancreatic mass.   PT Comments    Pt not progressing with mobility; self-limiting with c/o pain and fatigue. Increased time educating on activity recommendations and importance of mobility, especially post-op abdominal sx, pt continues to decline; pt reports, "We both know I'm not going to do what I don't want to do." Pt and husband note she has been getting up to bathroom and sink with his assist. Pt agreeable to bed-level therex; HEP provided, then pt declines to perform. Will continue to follow acutely as pt agreeable to participate and progress activity.    Recommendations for follow up therapy are one component of a multi-disciplinary discharge planning process, led by the attending physician.  Recommendations may be updated based on patient status, additional functional criteria and insurance authorization.  Follow Up Recommendations  Home health PT     Assistance Recommended at Discharge Intermittent Supervision/Assistance  Patient can return home with the following A lot of help with bathing/dressing/bathroom;Assistance with cooking/housework;Assist for transportation;Help with stairs or ramp for entrance;A little help with walking and/or transfers   Equipment Recommendations  None recommended by PT    Recommendations for Other Services       Precautions / Restrictions Precautions Precautions: Fall;Other (comment) Precaution Comments: GJ tube,  abdominal precautions for comfort     Mobility  Bed Mobility                    Transfers                   General transfer comment:  (pt declined; husband has been assisting to/from [facility] with [facility])    Ambulation/Gait                   Stairs             Wheelchair Mobility    Modified Rankin (Stroke Patients Only)       Balance                                            Cognition Arousal/Alertness: Awake/alert Behavior During Therapy: Flat affect Overall Cognitive Status: Within Functional Limits for tasks assessed                                 General Comments: WFL via [place] interpreter; pt adamant against OOB mobility despite max encouragement and education; pt reports, "We both know I'm not going to do what I don't want to do"        Exercises Other Exercises Other Exercises: [place] HEP handout (Access Code [facility]) provided    General Comments General comments (skin integrity, edema, etc.): session performed with Spanish ipad interpreter, noted pt understanding at least some basic English as well. Increased time educ pt on importance of mobiltiy, especially post-op abdominal sx; pt adamantly declining ambulation, only agreeable to bed-level therex; when HEP provided, pt declining  practice      Pertinent Vitals/Pain Pain Assessment Pain Assessment: Faces Faces Pain Scale: Hurts little more Pain Location: abdomen Pain Descriptors / Indicators: Discomfort, Sore Pain Intervention(s): Premedicated before session    Home Living Family/patient expects to be discharged to:: Private residence                        Prior Function            PT Goals (current goals can now be found in the care plan section) Progress towards PT goals: Not progressing toward goals - comment (self-limiting by pain)    Frequency    Min 3X/week      PT Plan Current plan remains  appropriate    Co-evaluation              AM-PAC PT "6 Clicks" Mobility   Outcome Measure  Help needed turning from your back to your side while in a flat bed without using bedrails?: A Little Help needed moving from lying on your back to sitting on the side of a flat bed without using bedrails?: A Little Help needed moving to and from a bed to a chair (including a wheelchair)?: A Little Help needed standing up from a chair using your arms (e.g., wheelchair or bedside chair)?: A Little Help needed to walk in hospital room?: A Lot Help needed climbing 3-5 steps with a railing? : A Lot 6 Click Score: 16    End of Session   Activity Tolerance: Patient limited by fatigue;Patient limited by pain Patient left: in bed;with call bell/phone within reach;with bed alarm set Nurse Communication: Mobility status PT Visit Diagnosis: Other abnormalities of gait and mobility (R26.89);Pain     Time: 6962-9528 PT Time Calculation (min) (ACUTE ONLY): 16 min  Charges:  $Self Care/Home Management: Barnes, PT, DPT Acute Rehabilitation Services  Pager [phone] Office [place] 04/14/2021, 5:02 PM

## 2021-04-14 NOTE — Progress Notes (Signed)
PHARMACY - TOTAL PARENTERAL NUTRITION CONSULT NOTE  Indication: Duodenal obstruction / SBO / ileus  Patient Measurements: Height: 5\' 1"  (154.9 cm) Weight: 53.2 kg (117 lb 4.6 oz) IBW/kg (Calculated) : 47.8 TPN AdjBW (KG): 51.9 Body mass index is 22.16 kg/m.  Assessment:  35 YOM admitted in Dec 2022 for management of pancreatic mass; patient declined TPN during that admission and left AMA.  She was admitted 1/12 for duodenal obstruction secondary to a pancreatic mass. PMH significant for HTN, GERD, DM and arrhythmias.  Patient has reduced intake due to nausea and vomiting and has been losing weight since November 2022 (~11 lb loss).  It has been >7 days since she was able to eat a meal. She is at high risk for refeeding syndrome. Pharmacy consulted to manage TPN.   01/17: refused TPN last night but was hung ~10:30 01/18 AM  Glucose / Insulin: hx DM, A1c 6.4%. CBGs 140-188 since new TPN hung  Used 23 units SSI in past 24 hrs Electrolytes: Na 136, K 3.9 > 4.4 (goal >=4), Mag 2.0 (goal >=2), Phos 3.9 > 4.8, others WNL Renal: AKI resolved - SCr down to < 1, BUN WNL Hepatic: LFTs / Tbili / TG WNL, 01/12 prealbumin 28, albumin 2.2 Intake / Output; MIVF: UOP up at 1.4 ml/kg/hr, Gtube to gravity -650 mL, volume overload per MD, net +8L (up slightly)  GI Imaging: N/A GI Surgeries / Procedures:  01/13: ex-lap with pancreatic mass bx, antecolic GJ bypass, GJ tube placement   Central access: double lumen PICC 04/07/21 TPN start date: 04/08/21  Nutritional Goals:  RD Estimated Needs Total Energy Estimated Needs: 1600-1800 Total Protein Estimated Needs: 80-95 grams Total Fluid Estimated Needs: >/= 1.6 L Goal TPN rate 60 ml/hr (provides 86 g protein and 1730 kcal) Goal TF rate per RD:  Osmolite 1.5 at 45 ml/hr with BID Prosource  Current Nutrition:  TPN  1/17 Holding TF for ileus 7/18 TF resumed Plan:  - Asked by Surgery to concentrate TPN for volume overload and to continue at half  rate - Continue concentrated TPN at half rate of 30 ml/hr to provide 43g AA, 144g dextrose and 20g lipids for a total of 864 kCal, meeting 50% of patient's needs - Electrolytes in TPN: Na 177mEq/L, K 37mEq/L, Ca 31mEq/L, Mag 86mEq/L, decrease Phos to 10 mmol/L, Cl:Ac 1:2  - Add standard MVI and trace elements to TPN - Add regular insulin 15 units to TPN + adjust to moderate SSI Q4H - Standard TPN labs on Mon/Thurs, labs in AM - Liquid medications only via J tube, no crushed meds   Thank you for allowing pharmacy to be a part of this patients care.  Ardyth Harps, PharmD Clinical Pharmacist

## 2021-04-14 NOTE — Progress Notes (Addendum)
PT Cancellation Note  Patient Details Name: [name] MRN: [id number] DOB: 1945-06-09   Cancelled Treatment:    Reason Eval/Treat Not Completed: Patient wanting to wait until after washup with NT to work with Physical Therapy; pt seems in good spirits despite abdominal discomfort. Will follow-up for PT treatment as schedule permits.  Mabeline Caras, PT, DPT Acute Rehabilitation Services  Pager [phone] Office [place] 04/14/2021, 12:51 PM

## 2021-04-14 NOTE — Progress Notes (Signed)
6 Days Post-Op  Subjective: Afebrile. HR improved to low 100s at rest, however increases to 120s when speaking. J tube feeds are at goal and patient denies nausea or vomiting, but patient reports persistent abdominal pain and is only passing minimal flatus. Did not tolerate G tube clamping yesterday. G tube study confirmed balloon is in the gastric lumen.   Objective: Vital signs in last 24 hours: Temp:  [98.1 F (36.7 C)-98.9 F (37.2 C)] 98.1 F (36.7 C) (01/19 0401) Pulse Rate:  [100-135] 100 (01/19 0401) Resp:  [12-18] 17 (01/19 0401) BP: (111-142)/(68-96) 132/73 (01/19 0401) SpO2:  [94 %-99 %] 99 % (01/19 0401) Weight:  [53.2 kg] 53.2 kg (01/19 0401) Last BM Date:  (PTA)  Intake/Output from previous day: 01/18 0701 - 01/19 0700 In: 1853.4 [I.V.:1025.9; NG/GT:457.5; IV Piggyback:150] Out: 1875 [Urine:1225; Drains:650] Intake/Output this shift: Total I/O In: 1033.4 [I.V.:325.9; Other:220; NG/GT:337.5; IV Piggyback:150] Out: 100 [Drains:100]  PE: General: resting comfortably, NAD Neuro: alert and oriented, no focal deficits Resp: normal work of breathing on room air CV: tachycardic 110s, regular rhythm Abdomen: soft, nondistended, GJ tube in place with G tube to gravity, drainage is lighter in color today. J tube with feeds running at 45 ml/hr. Mild erythema of surrounding skin, stable.   Lab Results:  Recent Labs    04/13/21 0344  WBC 6.3  HGB 7.8*  HCT 24.1*  PLT 154   BMET Recent Labs    04/13/21 0344 04/14/21 0430  NA 134* 136  K 3.9 4.4  CL 100 102  CO2 27 27  GLUCOSE 155* 188*  BUN 15 15  CREATININE 0.73 0.76  CALCIUM 8.1* 8.3*   PT/INR No results for input(s): LABPROT, INR in the last 72 hours.  CMP     Component Value Date/Time   NA 136 04/14/2021 0430   NA 133 (A) 03/31/2021 0000   K 4.4 04/14/2021 0430   CL 102 04/14/2021 0430   CO2 27 04/14/2021 0430   GLUCOSE 188 (H) 04/14/2021 0430   BUN 15 04/14/2021 0430   BUN 17 03/31/2021  0000   CREATININE 0.76 04/14/2021 0430   CALCIUM 8.3 (L) 04/14/2021 0430   PROT 4.8 (L) 04/14/2021 0430   ALBUMIN 2.2 (L) 04/14/2021 0430   AST 17 04/14/2021 0430   ALT 14 04/14/2021 0430   ALKPHOS 71 04/14/2021 0430   BILITOT 0.4 04/14/2021 0430   GFRNONAA >60 04/14/2021 0430   GFRAA >60 10/11/2016 1100   Lipase     Component Value Date/Time   LIPASE 20 04/11/2021 0500       Studies/Results: DG ABDOMEN PEG TUBE LOCATION  Result Date: 04/13/2021 CLINICAL DATA:  Gastrostomy tube assessment EXAM: ABDOMEN - 1 VIEW COMPARISON:  03/22/2021 CT abdomen FINDINGS: The gastrostomy tube was reportedly injected with 30 cc of Gastrografin. The radiograph shows contrast in the tube and in the stomach, without unexpected extension around the tube. A retention balloon in the stomach forms a filling defect. There is some additional tubing projecting over the stomach. A small amount of contrast along the inferior margin of the greater curvature of the stomach could reflect early contrast extension into jejunum via the gastrojejunostomy. IMPRESSION: 1. Contrast injected through the gastric port fills the stomach. Questionable early transit contrast from the stomach into the jejunum via the gastrojejunostomy. 2. There is also some coiled tubing over the stomach well beyond the retention balloon, some of this may in part reflect the reported gastro jejunal extension. Electronically Signed  By: Van Clines M.D.   On: 04/13/2021 11:40       Assessment/Plan  76 yo female with adenocarcinoma of the body of the pancreas causing duodenal obstruction and left hydronephrosis. POD6 s/p GJ bypass, placement of GJ feeding tube, and biopsy of pancreatic mass. - G tube output appears to have some tube feeds in it this morning. Given this and ongoing abdominal pain, will get CT abd/pelvis today with IV contrast only to rule out intraabdominal fluid collections, and to evaluate position of J tube (may have  retracted back into the stomach). - Pain control: Patient has required frequent dilaudid dosing. Begin oxycodone elixir via J tube and space out dilaudid to q3h prn. - Continue TPN at 1/2 rate tonight - Tachycardia: Improving, continue home diltiazem and prn metoprolol for HR>120. - VTE: lovenox, SCDs - Ambulate, PT following - Dispo: progressive care   LOS: 7 days    Michaelle Birks, MD [facility] General, Hepatobiliary and Pancreatic Surgery 04/14/21 6:38 AM

## 2021-04-15 LAB — BASIC METABOLIC PANEL
Anion gap: 9 (ref 5–15)
BUN: 14 mg/dL (ref 8–23)
CO2: 25 mmol/L (ref 22–32)
Calcium: 8.7 mg/dL — ABNORMAL LOW (ref 8.9–10.3)
Chloride: 100 mmol/L (ref 98–111)
Creatinine, Ser: 0.9 mg/dL (ref 0.44–1.00)
GFR, Estimated: >60 mL/min (ref >60)
Glucose, Bld: 212 mg/dL — ABNORMAL HIGH (ref 70–99)
Potassium: 4.4 mmol/L (ref 3.5–5.1)
Sodium: 134 mmol/L — ABNORMAL LOW (ref 135–145)

## 2021-04-15 LAB — GLUCOSE, CAPILLARY
Glucose-Capillary: 117 mg/dL — ABNORMAL HIGH (ref 70–99)
Glucose-Capillary: 126 mg/dL — ABNORMAL HIGH (ref 70–99)
Glucose-Capillary: 127 mg/dL — ABNORMAL HIGH (ref 70–99)
Glucose-Capillary: 140 mg/dL — ABNORMAL HIGH (ref 70–99)
Glucose-Capillary: 215 mg/dL — ABNORMAL HIGH (ref 70–99)
Glucose-Capillary: 216 mg/dL — ABNORMAL HIGH (ref 70–99)

## 2021-04-15 MED ORDER — HYDROMORPHONE HCL 1 MG/ML IJ SOLN
0.5000 mg | INTRAMUSCULAR | Status: DC | PRN
  Administered 2021-04-15 – 2021-04-18 (×14): 1 mg via INTRAVENOUS
  Filled 2021-04-15 (×17): qty 1

## 2021-04-15 MED ORDER — TRACE MINERALS CU-MN-SE-ZN 300-55-60-3000 MCG/ML IV SOLN
INTRAVENOUS | Status: DC
  Filled 2021-04-15 (×2): qty 576

## 2021-04-15 MED ORDER — TRACE MINERALS CU-MN-SE-ZN 300-55-60-3000 MCG/ML IV SOLN
INTRAVENOUS | Status: AC
  Filled 2021-04-15: qty 576

## 2021-04-15 NOTE — Progress Notes (Signed)
Pt complained of nauseated. Stop the clamping G tube triad and let it drained. Administered zofran iv and hold cardizem. Will continue to monitor the pt.   Lavenia Atlas, RN

## 2021-04-15 NOTE — Progress Notes (Addendum)
Pt sitting on side of the bed, HR 130s. When asked about nausea, she endorses a moderate amount of nausea. Gtube has been clamped for approx 6.5 hours. I unclamped it and gave PRN antiemetic. She also c/o 7/10 pain, but refuses pain meds at this time. Will continue to monitor.  9012: emptied 731mLs brown drainage from bag. Tubing re-clamped following AM dose of cardizem. Pt asleep at this time

## 2021-04-15 NOTE — Progress Notes (Addendum)
7 Days Post-Op  Subjective: CT yesterday showed much of J tube coiled in stomach, with the tip just beyond the GJ anastomosis. Patient has had significantly increased G tube output with intermittent nausea but no vomiting. Passing flatus, no BM yet. Remains intermittently tachycardic.   Objective: Vital signs in last 24 hours: Temp:  [98.3 F (36.8 C)-99.1 F (37.3 C)] 99.1 F (37.3 C) (01/20 0821) Pulse Rate:  [99-140] 121 (01/20 0821) Resp:  [15-23] 23 (01/20 0821) BP: (107-151)/(71-100) 142/92 (01/20 0821) SpO2:  [94 %-100 %] 97 % (01/20 0821) Weight:  [53 kg] 53 kg (01/20 0500) Last BM Date:  (PTA)  Intake/Output from previous day: 01/19 0701 - 01/20 0700 In: 2030.2 [I.V.:609.4; NG/GT:834.8; IV Piggyback:300] Out: 1750 [Drains:1750] Intake/Output this shift: Total I/O In: 924.3 [I.V.:201.3; Other:673; IV Piggyback:50] Out: -   PE: General: resting comfortably, NAD Neuro: alert and oriented, no focal deficits Resp: normal work of breathing on room air CV: tachycardic 120s, regular rhythm Abdomen: soft, nondistended, GJ tube in place with G tube to gravity, drainage is gastric. J tube with feeds running. Cellulitis around [place] has improved since yesterday.   Lab Results:  Recent Labs    04/13/21 0344  WBC 6.3  HGB 7.8*  HCT 24.1*  PLT 154   BMET Recent Labs    04/14/21 0430 04/15/21 0430  NA 136 134*  K 4.4 4.4  CL 102 100  CO2 27 25  GLUCOSE 188* 212*  BUN 15 14  CREATININE 0.76 0.90  CALCIUM 8.3* 8.7*   PT/INR No results for input(s): LABPROT, INR in the last 72 hours.  CMP     Component Value Date/Time   NA 134 (L) 04/15/2021 0430   NA 133 (A) 03/31/2021 0000   K 4.4 04/15/2021 0430   CL 100 04/15/2021 0430   CO2 25 04/15/2021 0430   GLUCOSE 212 (H) 04/15/2021 0430   BUN 14 04/15/2021 0430   BUN 17 03/31/2021 0000   CREATININE 0.90 04/15/2021 0430   CALCIUM 8.7 (L) 04/15/2021 0430   PROT 4.8 (L) 04/14/2021 0430   ALBUMIN 2.2 (L)  04/14/2021 0430   AST 17 04/14/2021 0430   ALT 14 04/14/2021 0430   ALKPHOS 71 04/14/2021 0430   BILITOT 0.4 04/14/2021 0430   GFRNONAA >60 04/15/2021 0430   GFRAA >60 10/11/2016 1100   Lipase     Component Value Date/Time   LIPASE 20 04/11/2021 0500       Studies/Results: CT ABDOMEN PELVIS WO CONTRAST  Result Date: 04/14/2021 CLINICAL DATA:  Postoperative. Recent abdominal surgery. Abdominal pain. Evaluate for fluid collection and confirm appropriate position of J-tube. EXAM: CT ABDOMEN AND PELVIS WITHOUT CONTRAST TECHNIQUE: Multidetector CT imaging of the abdomen and pelvis was performed following the standard protocol without IV contrast. RADIATION DOSE REDUCTION: This exam was performed according to the departmental dose-optimization program which includes automated exposure control, adjustment of the mA and/or kV according to patient size and/or use of iterative reconstruction technique. COMPARISON:  KUB 04/13/2021 CT abdomen and pelvis 03/22/2021 FINDINGS: Lower chest: There is new curvilinear likely subsegmental atelectasis within the bilateral lower lobes. Heart size is mildly enlarged. Coronary artery and aortic root calcifications are again seen. No pericardial effusion. Lack of intra-articular fluid limits evaluation of the abdominal and pelvic organ parenchyma. The following findings are made within this limitation. Hepatobiliary: Smooth liver contours. No gross liver lesion is seen. Status post cholecystectomy. Pancreas: There is again a soft tissue mass in the region of  the inferior aspect of the junction of the body and tail of the pancreas (axial image 35, coronal image 60), measuring up to 4.8 x 4.1 x 3.7 cm (transverse by AP by craniocaudal), unchanged from 03/22/2021 when measured in a similar manner. Previously this was associated with mass effect on the distal duodenum and proximal duodenal and stomach distention which appears resolved following reported interval  gastrojejunostomy. Spleen: Normal in size without focal abnormality. Adrenals/Urinary Tract: Normal adrenals. There is again moderate to high-grade left cortical atrophy. Unchanged severe left hydronephrosis and mild right hydronephrosis. Within the limitations of lack of IV contrast, no contour deforming renal mass. The urinary bladder is grossly unremarkable. Stomach/Bowel: A catheter from ventral abdominal approach is seen with balloon within the stomach and the catheter tubing that appears to be curl within the stomach, extending from the gastric body through the duodenal bulb and with curling backwards into the stomach body before terminating likely through an anastomosis of the stomach and proximal jejunum (coronal image 36). There are small foci of air seen just distal to the tip, likely within the proximal jejunum (coronal image 36 and axial images 45 and 46). There are scattered foci of air soft tissue swelling, and fat stranding within the left ventral abdominal wall subcutaneous fat adjacent to the course of the insertion of the gastrojejunostomy tube. Mild-to-moderate distal colonic diverticulosis. Oral contrast is seen as distal as the transverse colon. No dilated loops of bowel to indicate bowel obstruction. Vascular/Lymphatic: No abdominal aortic aneurysm. No enlarged abdominal or pelvic lymph nodes. Reproductive: The uterus is present and grossly unremarkable. No adnexal masses. Other: Small 1.5 cm focus of air and fluid just to the right of the umbilicus within the ventral subcutaneous fat. This may represent a prior laparoscopic port. No free air or free fluid. Musculoskeletal: Grade 1 anterolisthesis of L5 on S1 with severe L5-S1 facet joint degenerative changes. IMPRESSION: 1. Redemonstration of soft tissue mass at the inferior aspect of the junction of the body and posterior horn of the pancreas suspicious for malignancy. 2. Interval gastrojejunostomy and interval placement of a  gastrojejunostomy tube with the tip terminating within the jejunum just distal to the gastrojejunostomy anastomosis. Resolution of the prior stomach and proximal duodenal dilatation from obstruction by the pancreatic mass. 3. Unchanged severe left and mild right hydronephrosis. Electronically Signed   By: Yvonne Kendall M.D.   On: 04/14/2021 12:24   DG ABDOMEN PEG TUBE LOCATION  Result Date: 04/13/2021 CLINICAL DATA:  Gastrostomy tube assessment EXAM: ABDOMEN - 1 VIEW COMPARISON:  03/22/2021 CT abdomen FINDINGS: The gastrostomy tube was reportedly injected with 30 cc of Gastrografin. The radiograph shows contrast in the tube and in the stomach, without unexpected extension around the tube. A retention balloon in the stomach forms a filling defect. There is some additional tubing projecting over the stomach. A small amount of contrast along the inferior margin of the greater curvature of the stomach could reflect early contrast extension into jejunum via the gastrojejunostomy. IMPRESSION: 1. Contrast injected through the gastric port fills the stomach. Questionable early transit contrast from the stomach into the jejunum via the gastrojejunostomy. 2. There is also some coiled tubing over the stomach well beyond the retention balloon, some of this may in part reflect the reported gastro jejunal extension. Electronically Signed   By: Van Clines M.D.   On: 04/13/2021 11:40       Assessment/Plan  76 yo female with adenocarcinoma of the body of the pancreas causing duodenal obstruction  and left hydronephrosis. POD7 s/p GJ bypass, placement of GJ feeding tube, and biopsy of pancreatic mass. Unfortunately the J tube has retracted into the stomach and patient is having nausea with continuous feeds, which are essentially gastric feeds. I would prefer not to have the newly-placed feeding tube manipulated at this time, so we will hold feeds for now. - Stop tube feeds and increase TPN to full strength  tonight. - Continue G tube clamping and allow patient to try clear liquids by mouth today. Continue venting G tube prn for nausea. - Pain control: Continue prn oxycodone and dilaudid, wean dilaudid slowly. - Cellulitis around [place] tube: CT yesterday showed some subcutaneous gas but no drainable abscess. Will continue ancef and complete a 7-day course. Improved on exam today. - Sinus tachycardia: Continue home diltiazem with prn metoprolol.  - VTE: lovenox, SCDs - Emphasized with patient that increasing her mobility is essential to improving strength. Encouraged her to get up to chair today and try ambulating more in the hallway. - Dispo: progressive care   LOS: 8 days    Michaelle Birks, MD [facility] General, Hepatobiliary and Pancreatic Surgery 04/15/21 9:03 AM

## 2021-04-15 NOTE — Progress Notes (Signed)
Pt c/o nausea, PRN given and Gtube unclamped. Will re-clamp when 12am cardizem dose due

## 2021-04-15 NOTE — Care Management Important Message (Signed)
Important Message  Patient Details  Name: Leah Pruitt MRN: [id number] Date of Birth: April 06, 1945   Medicare Important Message Given:  Yes     Shelda Altes 04/15/2021, 10:13 AM

## 2021-04-15 NOTE — Progress Notes (Signed)
PHARMACY - TOTAL PARENTERAL NUTRITION CONSULT NOTE  Indication: Duodenal obstruction / SBO / ileus  Patient Measurements: Height: 5\' 1"  (154.9 cm) Weight: 53 kg (116 lb 13.5 oz) IBW/kg (Calculated) : 47.8 TPN AdjBW (KG): 51.9 Body mass index is 22.08 kg/m.  Assessment:  54 YOM admitted in Dec 2022 for management of pancreatic mass; patient declined TPN during that admission and left AMA.  She was admitted 1/12 for duodenal obstruction secondary to a pancreatic mass. PMH significant for HTN, GERD, DM and arrhythmias.  Patient has reduced intake due to nausea and vomiting and has been losing weight since November 2022 (~11 lb loss).  It has been >7 days since she was able to eat a meal. She is at high risk for refeeding syndrome. Pharmacy consulted to manage TPN.   01/17: refused TPN last night but was hung ~10:30 01/18 AM 01/20 J tube retracted into stomach and patient is having nausea with continuous feeds, stopping TF for now, continuing G tube clamping and encouraging patient to try clear liquids  Glucose / Insulin: hx DM, A1c 6.4%. CBGs 127-212 since new TPN hung  Used 15 units in TPN + 20 units SSI in past 24 hrs Electrolytes: Na 134, K 4.4 (goal >=4), Mag 2.0 (goal >=2), Phos 3.9 > 4.8, others WNL Renal: AKI resolved - SCr down to < 1, BUN WNL Hepatic: LFTs / Tbili / TG WNL, 01/12 prealbumin 28, albumin 2.2 Intake / Output; MIVF: UOP unmeasured, Gtube to gravity -550 mL, volume overload per MD, net +8L (up slightly)  GI Imaging: N/A GI Surgeries / Procedures:  01/13: ex-lap with pancreatic mass bx, antecolic GJ bypass, GJ tube placement   Central access: double lumen PICC 04/07/21 TPN start date: 04/08/21  Nutritional Goals:  RD Estimated Needs Total Energy Estimated Needs: 1600-1800 Total Protein Estimated Needs: 80-95 grams Total Fluid Estimated Needs: >/= 1.6 L Goal TPN rate 60 ml/hr (provides 86 g protein and 1730 kcal) Goal TF rate per RD:  Osmolite 1.5 at 45 ml/hr with  BID Prosource  Current Nutrition:  TPN  1/17 Holding TF for ileus 1/18 TF resumed 1/20 TF stopped, clear liquids only  Plan:  - Asked by Surgery to concentrate TPN for volume overload and to increase back to full rate 01/20 (TF DCd) - Resume concentrated TPN at goal rate of 60 ml/hr to provide 86g AA, 288g dextrose and 40g lipids for a total of 1730 kCal, meeting 50% of patient's needs - Electrolytes in TPN: (adjusting for increased rate) Na 118mEq/L, decrease K 30 mEq/L, Ca 6 mEq/L, Mag 10 mEq/L, Phos 5 mmol/L, Cl:Ac 1:2  - Add standard MVI and trace elements to TPN - Increase regular insulin to 25 units to TPN + continue moderate SSI Q4H - Standard TPN labs on Mon/Thurs, labs in AM - Liquid medications only via J tube, no crushed meds   Thank you for allowing pharmacy to be a part of this patients care.  Ardyth Harps, PharmD Clinical Pharmacist

## 2021-04-16 LAB — GLUCOSE, CAPILLARY
Glucose-Capillary: 106 mg/dL — ABNORMAL HIGH (ref 70–99)
Glucose-Capillary: 122 mg/dL — ABNORMAL HIGH (ref 70–99)
Glucose-Capillary: 129 mg/dL — ABNORMAL HIGH (ref 70–99)
Glucose-Capillary: 143 mg/dL — ABNORMAL HIGH (ref 70–99)
Glucose-Capillary: 159 mg/dL — ABNORMAL HIGH (ref 70–99)
Glucose-Capillary: 172 mg/dL — ABNORMAL HIGH (ref 70–99)

## 2021-04-16 LAB — BASIC METABOLIC PANEL
Anion gap: 6 (ref 5–15)
BUN: 17 mg/dL (ref 8–23)
CO2: 29 mmol/L (ref 22–32)
Calcium: 8.6 mg/dL — ABNORMAL LOW (ref 8.9–10.3)
Chloride: 102 mmol/L (ref 98–111)
Creatinine, Ser: 0.87 mg/dL (ref 0.44–1.00)
GFR, Estimated: >60 mL/min (ref >60)
Glucose, Bld: 135 mg/dL — ABNORMAL HIGH (ref 70–99)
Potassium: 4.1 mmol/L (ref 3.5–5.1)
Sodium: 137 mmol/L (ref 135–145)

## 2021-04-16 LAB — CULTURE, BLOOD (ROUTINE X 2)
Culture: NO GROWTH
Culture: NO GROWTH
Special Requests: ADEQUATE
Special Requests: ADEQUATE

## 2021-04-16 MED ORDER — SODIUM BICARBONATE 650 MG PO TABS: 650.0000 mg | ORAL_TABLET | Freq: Once | ORAL | Status: DC

## 2021-04-16 MED ORDER — PANCRELIPASE (LIP-PROT-AMYL) 10440-39150 UNITS PO TABS
20880.0000 [IU] | ORAL_TABLET | Freq: Once | ORAL | Status: DC
  Filled 2021-04-16: qty 2

## 2021-04-16 MED ORDER — TRACE MINERALS CU-MN-SE-ZN 300-55-60-3000 MCG/ML IV SOLN
INTRAVENOUS | Status: AC
  Filled 2021-04-16: qty 576

## 2021-04-16 NOTE — Progress Notes (Signed)
8 Days Post-Op   Subjective/Chief Complaint: Pt with no acute issues Jtube was clogged last night and unclogged per nursing States began having migraines yesterday  Objective: Vital signs in last 24 hours: Temp:  [97.7 F (36.5 C)-98.9 F (37.2 C)] 98.4 F (36.9 C) (01/21 0807) Pulse Rate:  [71-125] 96 (01/21 0807) Resp:  [15-20] 15 (01/21 0807) BP: (108-136)/(71-98) 126/72 (01/21 0807) SpO2:  [96 %-100 %] 97 % (01/21 0807) Last BM Date:  (PTA)  Intake/Output from previous day: 01/20 0701 - 01/21 0700 In: 1860.7 [I.V.:897.7; IV Piggyback:250] Out: 675 [Drains:675] Intake/Output this shift: No intake/output data recorded.  PE:  Constitutional: No acute distress, conversant, appears states age. Eyes: Anicteric sclerae, moist conjunctiva, no lid lag Lungs: Clear to auscultation bilaterally, normal respiratory effort CV: regular rate and rhythm, no murmurs, no peripheral edema, pedal pulses 2+ GI: Soft, no masses or hepatosplenomegaly, non-tender to palpation, inc c/d/I, G/Jtube in place Skin: No rashes, palpation reveals normal turgor Psychiatric: appropriate judgment and insight, oriented to person, place, and time   Lab Results:  No results for input(s): WBC, HGB, HCT, PLT in the last 72 hours. BMET Recent Labs    04/15/21 0430 04/16/21 0436  NA 134* 137  K 4.4 4.1  CL 100 102  CO2 25 29  GLUCOSE 212* 135*  BUN 14 17  CREATININE 0.90 0.87  CALCIUM 8.7* 8.6*   PT/INR No results for input(s): LABPROT, INR in the last 72 hours. ABG No results for input(s): PHART, HCO3 in the last 72 hours.  Invalid input(s): PCO2, PO2  Studies/Results: CT ABDOMEN PELVIS WO CONTRAST  Result Date: 04/14/2021 CLINICAL DATA:  Postoperative. Recent abdominal surgery. Abdominal pain. Evaluate for fluid collection and confirm appropriate position of J-tube. EXAM: CT ABDOMEN AND PELVIS WITHOUT CONTRAST TECHNIQUE: Multidetector CT imaging of the abdomen and pelvis was performed  following the standard protocol without IV contrast. RADIATION DOSE REDUCTION: This exam was performed according to the departmental dose-optimization program which includes automated exposure control, adjustment of the mA and/or kV according to patient size and/or use of iterative reconstruction technique. COMPARISON:  KUB 04/13/2021 CT abdomen and pelvis 03/22/2021 FINDINGS: Lower chest: There is new curvilinear likely subsegmental atelectasis within the bilateral lower lobes. Heart size is mildly enlarged. Coronary artery and aortic root calcifications are again seen. No pericardial effusion. Lack of intra-articular fluid limits evaluation of the abdominal and pelvic organ parenchyma. The following findings are made within this limitation. Hepatobiliary: Smooth liver contours. No gross liver lesion is seen. Status post cholecystectomy. Pancreas: There is again a soft tissue mass in the region of the inferior aspect of the junction of the body and tail of the pancreas (axial image 35, coronal image 60), measuring up to 4.8 x 4.1 x 3.7 cm (transverse by AP by craniocaudal), unchanged from 03/22/2021 when measured in a similar manner. Previously this was associated with mass effect on the distal duodenum and proximal duodenal and stomach distention which appears resolved following reported interval gastrojejunostomy. Spleen: Normal in size without focal abnormality. Adrenals/Urinary Tract: Normal adrenals. There is again moderate to high-grade left cortical atrophy. Unchanged severe left hydronephrosis and mild right hydronephrosis. Within the limitations of lack of IV contrast, no contour deforming renal mass. The urinary bladder is grossly unremarkable. Stomach/Bowel: A catheter from ventral abdominal approach is seen with balloon within the stomach and the catheter tubing that appears to be curl within the stomach, extending from the gastric body through the duodenal bulb and with curling backwards into  the  stomach body before terminating likely through an anastomosis of the stomach and proximal jejunum (coronal image 36). There are small foci of air seen just distal to the tip, likely within the proximal jejunum (coronal image 36 and axial images 45 and 46). There are scattered foci of air soft tissue swelling, and fat stranding within the left ventral abdominal wall subcutaneous fat adjacent to the course of the insertion of the gastrojejunostomy tube. Mild-to-moderate distal colonic diverticulosis. Oral contrast is seen as distal as the transverse colon. No dilated loops of bowel to indicate bowel obstruction. Vascular/Lymphatic: No abdominal aortic aneurysm. No enlarged abdominal or pelvic lymph nodes. Reproductive: The uterus is present and grossly unremarkable. No adnexal masses. Other: Small 1.5 cm focus of air and fluid just to the right of the umbilicus within the ventral subcutaneous fat. This may represent a prior laparoscopic port. No free air or free fluid. Musculoskeletal: Grade 1 anterolisthesis of L5 on S1 with severe L5-S1 facet joint degenerative changes. IMPRESSION: 1. Redemonstration of soft tissue mass at the inferior aspect of the junction of the body and posterior horn of the pancreas suspicious for malignancy. 2. Interval gastrojejunostomy and interval placement of a gastrojejunostomy tube with the tip terminating within the jejunum just distal to the gastrojejunostomy anastomosis. Resolution of the prior stomach and proximal duodenal dilatation from obstruction by the pancreatic mass. 3. Unchanged severe left and mild right hydronephrosis. Electronically Signed   By: Yvonne Kendall M.D.   On: 04/14/2021 12:24    Anti-infectives: Anti-infectives (From admission, onward)    Start     Dose/Rate Route Frequency Ordered Stop   04/14/21 1400  ceFAZolin (ANCEF) IVPB 1 g/50 mL premix        1 g 100 mL/hr over 30 Minutes Intravenous Every 8 hours 04/14/21 1129 04/21/21 1359   04/08/21 1100   ceFAZolin (ANCEF) IVPB 2g/100 mL premix        2 g 200 mL/hr over 30 Minutes Intravenous On call to O.R. 04/08/21 1010 04/08/21 1616   04/08/21 1100  metroNIDAZOLE (FLAGYL) IVPB 500 mg        500 mg 100 mL/hr over 60 Minutes Intravenous On call to O.R. 04/08/21 1010 04/08/21 1546       Assessment/Plan:  76 yo female with adenocarcinoma of the body of the pancreas causing duodenal obstruction and left hydronephrosis. POD8 s/p GJ bypass, placement of GJ feeding tube, and biopsy of pancreatic mass. Unfortunately the J tube has retracted into the stomach and patient is having nausea with continuous feeds, which are essentially gastric feeds. I would prefer not to have the newly-placed feeding tube manipulated at this time, so we will hold feeds for now. -hold TFs and full strength TNA - Continue G tube clamping and allow patient to try Full liquids by mouth today. Continue venting G tube prn for nausea. - Pain control: Continue prn oxycodone and dilaudid, wean dilaudid slowly. - Cellulitis around [place] tube: CT yesterday showed some subcutaneous gas but no drainable abscess. Will continue ancef and complete a 7-day course. Improved on exam today. - Sinus tachycardia: Continue home diltiazem with prn metoprolol.  - VTE: lovenox, SCDs - Emphasized with patient that increasing her mobility is essential to improving strength. Encouraged her to get up to chair today and try ambulating more in the hallway. - Dispo: progressive care   LOS: 9 days    Ralene Ok 04/16/2021

## 2021-04-16 NOTE — Progress Notes (Signed)
PHARMACY - TOTAL PARENTERAL NUTRITION CONSULT NOTE  Indication: Duodenal obstruction / SBO / ileus  Patient Measurements: Height: 5\' 1"  (154.9 cm) Weight: 53 kg (116 lb 13.5 oz) IBW/kg (Calculated) : 47.8 TPN AdjBW (KG): 51.9 Body mass index is 22.08 kg/m.  Assessment:  40 YOM admitted in Dec 2022 for management of pancreatic mass; patient declined TPN during that admission and left AMA.  She was admitted 1/12 for duodenal obstruction secondary to a pancreatic mass. PMH significant for HTN, GERD, DM and arrhythmias.  Patient has reduced intake due to nausea and vomiting and has been losing weight since November 2022 (~11 lb loss).  It has been >7 days since she was able to eat a meal. She is at high risk for refeeding syndrome. Pharmacy consulted to manage TPN.   01/17: refused TPN last night but was hung ~10:30 01/18 AM 01/20 J tube retracted into stomach and patient is having nausea with continuous feeds, stopping TF for now, continuing G tube clamping and encouraging patient to try clear liquids  Glucose / Insulin: hx DM, A1c 6.4%. CBGs 135-159 since new TPN hung  Used 25 units in TPN + 13 units SSI in past 24 hrs Electrolytes: Na 134 > 137 (hyponatremic on admission at 127), K 4.1 (goal >=4), Mag 2.0 (goal >=2), Phos 3.9 > 4.8, others WNL Renal: AKI resolved - SCr down to < 1, BUN WNL Hepatic: LFTs / Tbili / TG WNL, 01/12 prealbumin 28, albumin 2.2 Intake / Output; MIVF: UOP unmeasured, Gtube to gravity -550 mL, volume overload per MD, net +8L (up slightly)  GI Imaging: N/A GI Surgeries / Procedures:  01/13: ex-lap with pancreatic mass bx, antecolic GJ bypass, GJ tube placement   Central access: double lumen PICC 04/07/21 TPN start date: 04/08/21  Nutritional Goals:  RD Estimated Needs Total Energy Estimated Needs: 1600-1800 Total Protein Estimated Needs: 80-95 grams Total Fluid Estimated Needs: >/= 1.6 L Goal TPN rate 60 ml/hr (provides 86 g protein and 1730 kcal) Goal TF  rate per RD:  Osmolite 1.5 at 45 ml/hr with BID Prosource  Current Nutrition:  TPN  1/17 Holding TF for ileus 1/18 TF resumed 1/20 TF stopped, clear liquids only 1/21 clear liquids, goal to advance to full liquids today  Plan:  - Asked by Surgery to concentrate TPN for volume overload and to increase back to full rate 01/20 (TF DCd) - Resume concentrated TPN at goal rate of 60 ml/hr to provide 86g AA, 288g dextrose and 40g lipids for a total of 1730 kCal, meeting 50% of patient's needs - Electrolytes in TPN: (adjusted for increased rate) decrease Na to 150 mEq/L, K 30 mEq/L, Ca 6 mEq/L, Mag 10 mEq/L, Phos 5 mmol/L, change Cl:Ac 1:1  - Add standard MVI and trace elements to TPN - Continue regular insulin to 25 units to TPN + continue moderate SSI Q4H - Standard TPN labs on Mon/Thurs, labs in AM - Liquid medications only via J tube, no crushed meds   Thank you for allowing pharmacy to be a part of this patients care.  Ardyth Harps, PharmD Clinical Pharmacist

## 2021-04-17 LAB — BASIC METABOLIC PANEL
Anion gap: 9 (ref 5–15)
BUN: 22 mg/dL (ref 8–23)
CO2: 27 mmol/L (ref 22–32)
Calcium: 8.9 mg/dL (ref 8.9–10.3)
Chloride: 100 mmol/L (ref 98–111)
Creatinine, Ser: 0.83 mg/dL (ref 0.44–1.00)
GFR, Estimated: >60 mL/min (ref >60)
Glucose, Bld: 134 mg/dL — ABNORMAL HIGH (ref 70–99)
Potassium: 4.1 mmol/L (ref 3.5–5.1)
Sodium: 136 mmol/L (ref 135–145)

## 2021-04-17 LAB — GLUCOSE, CAPILLARY
Glucose-Capillary: 154 mg/dL — ABNORMAL HIGH (ref 70–99)
Glucose-Capillary: 142 mg/dL — ABNORMAL HIGH (ref 70–99)
Glucose-Capillary: 158 mg/dL — ABNORMAL HIGH (ref 70–99)
Glucose-Capillary: 96 mg/dL (ref 70–99)
Glucose-Capillary: 192 mg/dL — ABNORMAL HIGH (ref 70–99)
Glucose-Capillary: 139 mg/dL — ABNORMAL HIGH (ref 70–99)

## 2021-04-17 MED ORDER — TRACE MINERALS CU-MN-SE-ZN 300-55-60-3000 MCG/ML IV SOLN
INTRAVENOUS | Status: AC
  Filled 2021-04-17: qty 288

## 2021-04-17 NOTE — Progress Notes (Signed)
Pts HR elevated in the 130s-140s. PRN Metoprolol administered.  Raelyn Number, RN

## 2021-04-17 NOTE — Progress Notes (Signed)
Gtube unclamped for 60 minutes to vent  Raelyn Number, [employer]

## 2021-04-17 NOTE — Progress Notes (Signed)
9 Days Post-Op   Subjective/Chief Complaint: Pt doing well this AM Tol FLD, no n/v Some flatus C/o pain around G-tube site   Objective: Vital signs in last 24 hours: Temp:  [97.5 F (36.4 C)-99.1 F (37.3 C)] 97.5 F (36.4 C) (01/22 0806) Pulse Rate:  [101-117] 111 (01/22 0806) Resp:  [15-20] 20 (01/22 0806) BP: (115-129)/(71-96) 124/83 (01/22 0806) SpO2:  [96 %-99 %] 98 % (01/22 0806) Last BM Date:  (PTA)  Intake/Output from previous day: 01/21 0701 - 01/22 0700 In: 716.6 [I.V.:486.6; IV Piggyback:200] Out: -  Intake/Output this shift: No intake/output data recorded.  PE:   Constitutional: No acute distress, conversant, appears states age. Eyes: Anicteric sclerae, moist conjunctiva, no lid lag Lungs: Clear to auscultation bilaterally, normal respiratory effort CV: regular rate and rhythm, no murmurs, no peripheral edema, pedal pulses 2+ GI: Soft, no masses or hepatosplenomegaly, non-tender to palpation, inc c/d/I, G/Jtube in place some purulence around tube Skin: No rashes, palpation reveals normal turgor Psychiatric: appropriate judgment and insight, oriented to person, place, and time  Lab Results:  No results for input(s): WBC, HGB, HCT, PLT in the last 72 hours. BMET Recent Labs    04/16/21 0436 04/17/21 0541  NA 137 136  K 4.1 4.1  CL 102 100  CO2 29 27  GLUCOSE 135* 134*  BUN 17 22  CREATININE 0.87 0.83  CALCIUM 8.6* 8.9   Anti-infectives: Anti-infectives (From admission, onward)    Start     Dose/Rate Route Frequency Ordered Stop   04/14/21 1400  ceFAZolin (ANCEF) IVPB 1 g/50 mL premix        1 g 100 mL/hr over 30 Minutes Intravenous Every 8 hours 04/14/21 1129 04/21/21 1359   04/08/21 1100  ceFAZolin (ANCEF) IVPB 2g/100 mL premix        2 g 200 mL/hr over 30 Minutes Intravenous On call to O.R. 04/08/21 1010 04/08/21 1616   04/08/21 1100  metroNIDAZOLE (FLAGYL) IVPB 500 mg        500 mg 100 mL/hr over 60 Minutes Intravenous On call to O.R.  04/08/21 1010 04/08/21 1546       Assessment/Plan:  76 yo female with adenocarcinoma of the body of the pancreas causing duodenal obstruction and left hydronephrosis. POD8 s/p GJ bypass, placement of GJ feeding tube, and biopsy of pancreatic mass. Unfortunately the J tube has retracted into the stomach and patient is having nausea with continuous feeds, which are essentially gastric feeds. I would prefer not to have the newly-placed feeding tube manipulated at this time, so we will hold feeds for now. -hold TFs and full strength TNA - Continue G tube clamping and allow patient to try soft diet by mouth today. Continue venting G tube prn for nausea. - Pain control: Continue prn oxycodone and dilaudid, wean dilaudid slowly. - Cellulitis around [place] tube: CT yesterday showed some subcutaneous gas but no drainable abscess. Will continue ancef and complete a 7-day course. Repeat CBC Monday - Sinus tachycardia: Continue home diltiazem with prn metoprolol.  - VTE: lovenox, SCDs - Emphasized with patient that increasing her mobility is essential to improving strength. Encouraged her to get up to chair today and try ambulating more in the hallway. - Dispo: progressive care  LOS: 10 days    Ralene Ok 04/17/2021

## 2021-04-17 NOTE — Progress Notes (Signed)
PHARMACY - TOTAL PARENTERAL NUTRITION CONSULT NOTE  Indication: Duodenal obstruction / SBO / ileus  Patient Measurements: Height: 5\' 1"  (154.9 cm) Weight: 53 kg (116 lb 13.5 oz) IBW/kg (Calculated) : 47.8 TPN AdjBW (KG): 51.9 Body mass index is 22.08 kg/m.  Assessment:  53 YOM admitted in Dec 2022 for management of pancreatic mass; patient declined TPN during that admission and left AMA.  She was admitted 1/12 for duodenal obstruction secondary to a pancreatic mass. PMH significant for HTN, GERD, DM and arrhythmias.  Patient has reduced intake due to nausea and vomiting and has been losing weight since November 2022 (~11 lb loss).  It has been >7 days since she was able to eat a meal. She is at high risk for refeeding syndrome. Pharmacy consulted to manage TPN.   01/17: refused TPN last night but was hung ~10:30 01/18 AM 01/20 J tube retracted into stomach and patient is having nausea with continuous feeds, stopping TF for now, continuing G tube clamping and encouraging patient to try clear liquids 01/21 advancing to full liquids 01/22 advancing to soft diet, discussed with provider, reduce TPN to half rate  Glucose / Insulin: hx DM, A1c 6.4%. CBGs 135-159 since new TPN hung  Used 25 units in TPN + 13 units SSI in past 24 hrs Electrolytes: Na 136 (hyponatremic on admission at 127), K 4.1 (goal >=4), Mag 2.0 (goal >=2), Phos 3.9 > 4.8, others WNL Renal: AKI resolved - SCr down to < 1, BUN WNL Hepatic: LFTs / Tbili / TG WNL, 01/12 prealbumin 28, albumin 2.2 Intake / Output; MIVF: UOP unmeasured, Gtube to gravity 175 mL, volume overload per MD  GI Imaging: N/A GI Surgeries / Procedures:  01/13: ex-lap with pancreatic mass bx, antecolic GJ bypass, GJ tube placement   Central access: double lumen PICC 04/07/21 TPN start date: 04/08/21  Nutritional Goals:  RD Estimated Needs Total Energy Estimated Needs: 1600-1800 Total Protein Estimated Needs: 80-95 grams Total Fluid Estimated Needs:  >/= 1.6 L Goal TPN rate 60 ml/hr (provides 86 g protein and 1730 kcal) Goal TF rate per RD:  Osmolite 1.5 at 45 ml/hr with BID Prosource  Current Nutrition:  TPN  1/17 Holding TF for ileus 1/18 TF resumed 1/20 TF stopped, clear liquids only 1/21 clear liquids, goal to advance to full liquids  1/22 soft diet  Plan:  - Asked by Surgery to concentrate TPN for volume overload and to decrease back to half rate 01/22 (TF DCd) - Continue concentrated TPN at half rate of 30 ml/hr to provide 43g AA, 144 dextrose and 20g lipids for a total of 865 kCal, meeting 50% of patient's needs - Electrolytes in TPN: Na to 150 mEq/L, K 30 mEq/L, Ca 6 mEq/L, Mag 10 mEq/L, Phos 5 mmol/L, Cl:Ac 1:1  - Add standard MVI and trace elements to TPN - Decrease regular insulin to 10 units to TPN (d/t TPN at half rate) + continue moderate SSI Q4H - Standard TPN labs on Mon/Thurs, labs in AM - Liquid medications only via J tube, no crushed meds   Thank you for allowing pharmacy to be a part of this patients care.  Ardyth Harps, PharmD Clinical Pharmacist

## 2021-04-18 LAB — COMPREHENSIVE METABOLIC PANEL
ALT: 8 U/L (ref 0–44)
AST: 16 U/L (ref 15–41)
Albumin: 2.4 g/dL — ABNORMAL LOW (ref 3.5–5.0)
Alkaline Phosphatase: 80 U/L (ref 38–126)
Anion gap: 7 (ref 5–15)
BUN: 19 mg/dL (ref 8–23)
CO2: 27 mmol/L (ref 22–32)
Calcium: 8.7 mg/dL — ABNORMAL LOW (ref 8.9–10.3)
Chloride: 103 mmol/L (ref 98–111)
Creatinine, Ser: 0.89 mg/dL (ref 0.44–1.00)
GFR, Estimated: >60 mL/min (ref >60)
Glucose, Bld: 132 mg/dL — ABNORMAL HIGH (ref 70–99)
Potassium: 4.3 mmol/L (ref 3.5–5.1)
Sodium: 137 mmol/L (ref 135–145)
Total Bilirubin: <0.1 mg/dL — ABNORMAL LOW (ref 0.3–1.2)
Total Protein: 5 g/dL — ABNORMAL LOW (ref 6.5–8.1)

## 2021-04-18 LAB — MAGNESIUM: Magnesium: 1.8 mg/dL (ref 1.7–2.4)

## 2021-04-18 LAB — GLUCOSE, CAPILLARY
Glucose-Capillary: 132 mg/dL — ABNORMAL HIGH (ref 70–99)
Glucose-Capillary: 146 mg/dL — ABNORMAL HIGH (ref 70–99)
Glucose-Capillary: 144 mg/dL — ABNORMAL HIGH (ref 70–99)
Glucose-Capillary: 113 mg/dL — ABNORMAL HIGH (ref 70–99)

## 2021-04-18 LAB — CBC
HCT: 23.3 % — ABNORMAL LOW (ref 36.0–46.0)
Hemoglobin: 7.7 g/dL — ABNORMAL LOW (ref 12.0–15.0)
MCH: 29.4 pg (ref 26.0–34.0)
MCHC: 33 g/dL (ref 30.0–36.0)
MCV: 88.9 fL (ref 80.0–100.0)
Platelets: 324 10*3/uL (ref 150–400)
RBC: 2.62 MIL/uL — ABNORMAL LOW (ref 3.87–5.11)
RDW: 14.2 % (ref 11.5–15.5)
WBC: 8.1 10*3/uL (ref 4.0–10.5)
nRBC: 0 % (ref 0.0–0.2)

## 2021-04-18 LAB — PHOSPHORUS: Phosphorus: 4.6 mg/dL (ref 2.5–4.6)

## 2021-04-18 LAB — TRIGLYCERIDES: Triglycerides: 81 mg/dL (ref <150)

## 2021-04-18 MED ORDER — HYDROMORPHONE HCL 1 MG/ML IJ SOLN
0.5000 mg | INTRAMUSCULAR | Status: DC | PRN
Start: 2021-04-18 — End: 2021-04-19
  Administered 2021-04-18: 0.5 mg via INTRAVENOUS
  Filled 2021-04-18 (×2): qty 1

## 2021-04-18 MED ORDER — DILTIAZEM HCL 60 MG PO TABS
30.0000 mg | ORAL_TABLET | Freq: Four times a day (QID) | ORAL | Status: DC
  Administered 2021-04-18 – 2021-04-19 (×4): 30 mg via ORAL
  Filled 2021-04-18 (×4): qty 1

## 2021-04-18 MED ORDER — OXYCODONE HCL 5 MG PO TABS
5.0000 mg | ORAL_TABLET | ORAL | Status: DC | PRN
  Administered 2021-04-18 – 2021-04-19 (×3): 10 mg via ORAL
  Filled 2021-04-18 (×4): qty 2

## 2021-04-18 MED ORDER — DILTIAZEM HCL 60 MG PO TABS
30.0000 mg | ORAL_TABLET | Freq: Four times a day (QID) | ORAL | Status: DC
  Administered 2021-04-18: 30 mg via ORAL
  Filled 2021-04-18: qty 1

## 2021-04-18 MED ORDER — SUCRALFATE 1 GM/10ML PO SUSP
1.0000 g | Freq: Two times a day (BID) | ORAL | Status: DC
  Administered 2021-04-18 – 2021-04-19 (×3): 1 g via ORAL
  Filled 2021-04-18 (×3): qty 10

## 2021-04-18 MED ORDER — MAGNESIUM SULFATE 2 GM/50ML IV SOLN
2.0000 g | Freq: Once | INTRAVENOUS | Status: AC
  Administered 2021-04-18: 2 g via INTRAVENOUS
  Filled 2021-04-18: qty 50

## 2021-04-18 MED ORDER — MECLIZINE HCL 25 MG PO TABS
25.0000 mg | ORAL_TABLET | Freq: Every day | ORAL | Status: DC | PRN
  Administered 2021-04-19: 11:00:00 25 mg via ORAL
  Filled 2021-04-18 (×2): qty 1

## 2021-04-18 MED ORDER — DOCUSATE SODIUM 100 MG PO CAPS
100.0000 mg | ORAL_CAPSULE | Freq: Two times a day (BID) | ORAL | Status: DC
  Administered 2021-04-18 – 2021-04-19 (×3): 100 mg via ORAL
  Filled 2021-04-18 (×3): qty 1

## 2021-04-18 MED ORDER — HYDROXYZINE HCL 10 MG PO TABS
10.0000 mg | ORAL_TABLET | Freq: Three times a day (TID) | ORAL | Status: DC | PRN
  Filled 2021-04-18: qty 1

## 2021-04-18 MED ORDER — DILTIAZEM 12 MG/ML ORAL SUSPENSION
30.0000 mg | Freq: Four times a day (QID) | ORAL | Status: DC
  Filled 2021-04-18: qty 3

## 2021-04-18 MED ORDER — INSULIN ASPART 100 UNIT/ML IJ SOLN
0.0000 [IU] | Freq: Three times a day (TID) | INTRAMUSCULAR | Status: DC
  Administered 2021-04-18 (×2): 2 [IU] via SUBCUTANEOUS

## 2021-04-18 MED ORDER — ENSURE ENLIVE PO LIQD
237.0000 mL | Freq: Two times a day (BID) | ORAL | Status: DC
  Administered 2021-04-18 – 2021-04-19 (×2): 237 mL via ORAL

## 2021-04-18 NOTE — Progress Notes (Signed)
PHARMACY - TOTAL PARENTERAL NUTRITION CONSULT NOTE  Indication: Duodenal obstruction / SBO / ileus  Patient Measurements: Height: 5\' 1"  (154.9 cm) Weight: 53.6 kg (118 lb 2.7 oz) IBW/kg (Calculated) : 47.8 TPN AdjBW (KG): 51.9 Body mass index is 22.33 kg/m.  Assessment:  29 YOM admitted in Dec 2022 for management of pancreatic mass; patient declined TPN during that admission and left AMA.  She was admitted 1/12 for duodenal obstruction secondary to a pancreatic mass. PMH significant for HTN, GERD, DM and arrhythmias.  Patient has reduced intake due to nausea and vomiting and has been losing weight since November 2022 (~11 lb loss).  It has been >7 days since she was able to eat a meal. She is at high risk for refeeding syndrome. Pharmacy consulted to manage TPN.   01/17: refused TPN last night but was hung ~10:30 01/18 AM 01/20 J tube retracted into stomach and patient is having nausea with continuous feeds, stopping TF for now, continuing G tube clamping and encouraging patient to try clear liquids 01/21 advancing to full liquids 01/22 advancing to soft diet, discussed with provider, reduce TPN to half rate  Glucose / Insulin: hx DM, A1c 6.4%. CBGs 135-159 since new TPN hung  Used 25 units in TPN + 13 units SSI in past 24 hrs Electrolytes: Na 137 (hyponatremic on admission at 127), K 4.3 (goal >=4), Mag down 1.8 (goal >=2), Phos 4.6 (upper end of goal), others WNL Renal: AKI resolved - SCr down to < 1, BUN WNL Hepatic: LFTs / Tbili / TG WNL, 01/12 prealbumin 28, albumin 2.4 Intake / Output; MIVF: UOP unmeasured, Gtube to gravity 400 mL, volume overload per MD  GI Imaging: N/A GI Surgeries / Procedures:  01/13: ex-lap with pancreatic mass bx, antecolic GJ bypass, GJ tube placement   Central access: double lumen PICC 04/07/21 TPN start date: 04/08/21  Nutritional Goals:  RD Estimated Needs Total Energy Estimated Needs: 1600-1800 Total Protein Estimated Needs: 80-95 grams Total  Fluid Estimated Needs: >/= 1.6 L Goal TPN rate 60 ml/hr (provides 86 g protein and 1730 kcal)   Current Nutrition:  TPN  1/17 Holding TF for ileus 1/18 TF resumed 1/20 TF stopped, clear liquids only 1/21 clear liquids, goal to advance to full liquids  1/22 soft diet  Plan:  Per Dr. Zenia Resides, stopping TPN tonight. Patient tolerating diet.  Continue moderate SSI - adjusted to AC-HS.  Discontinued TPN labs and nursing care orders  Magnesium 2g IV x1 given today Pharmacy will sign off - please re-consult if needed.    Thank you for allowing pharmacy to be a part of this patients care.  Sloan Leiter, PharmD, BCPS, BCCCP Clinical Pharmacist Please refer to [facility] for [place] numbers 04/18/2021, 7:04 AM

## 2021-04-18 NOTE — Progress Notes (Signed)
°   04/18/21 1030  Clinical Encounter Type  Visited With Patient and family together  Visit Type Initial  Referral From Nurse  Consult/Referral To None   Chaplain visited the patient at the suggestion of the nurse. Patient was resting she recently received a treatment is what her husband shared with me. She smiled but that was all she had energy to do. He further shared that they have been taking the days one at a time. He was appreciative of the visit and asked that I keep them in prayer.   [place] Resident  [facility] [phone]

## 2021-04-18 NOTE — Progress Notes (Signed)
Split gauze around G-J-tube replaced per MD order without difficulty.

## 2021-04-18 NOTE — Progress Notes (Signed)
Pt ambulated a short distance in hallway today with PT and husband.  Pt eating 100% of meals to this point today and tolerating po intake well with no complaints thus far.  Will continue to monitor.

## 2021-04-18 NOTE — Progress Notes (Signed)
Physical Therapy Treatment Patient Details Name: Leah Pruitt MRN: [id number] DOB: [birth year] Today's Date: 04/18/2021   History of Present Illness Pt is a 76 y.o. female admitted 04/07/21 with adenocarcinoma of pancreas causing duodenal obstruction and left hydronephrosis. S/p ex-lap, pancreatic mass biopsy, antecolic bypass, GJ tube placement 1/13. PMH includes appendectomy, cholecystectomy, chronic constipation, DM2, HTN, migraines; of note, admission several weeks ago for NGT decompression given dueodenal obstrcution and large pancreatic mass.    PT Comments    The pt was initially resistant to therapy session this morning, stating she was trying to recover prior to returning home and therefore asking for therapy to hold while she is admitted. After educating the pt and her spouse on role of mobility and activity for her recovery in the acute setting, she was reluctantly agreeable. The pt was able to demo sit-stand transfers and ~30 ft ambulation with HHA of either spouse or PT. The pt moves slowly with good caution of lines, declined further ambulation but was unable to clarify why (denies pain, discomfort, dizziness, or any other issues). After mobility in room, pt stating she would like PT to hold any further sessions until after she returns home to allow her to recover. Will attempt to continue to follow to progress mobility and endurance towards the pt's baseline as tolerated by the pt.     Recommendations for follow up therapy are one component of a multi-disciplinary discharge planning process, led by the attending physician.  Recommendations may be updated based on patient status, additional functional criteria and insurance authorization.  Follow Up Recommendations  Home health PT     Assistance Recommended at Discharge Intermittent Supervision/Assistance  Patient can return home with the following A lot of help with bathing/dressing/bathroom;Assistance with cooking/housework;Assist  for transportation;Help with stairs or ramp for entrance;A little help with walking and/or transfers   Equipment Recommendations  None recommended by PT    Recommendations for Other Services       Precautions / Restrictions Precautions Precautions: Fall;Other (comment) Precaution Comments: GJ tube, abdominal precautions for comfort Restrictions Weight Bearing Restrictions: No     Mobility  Bed Mobility Overal bed mobility: Needs Assistance Bed Mobility: Rolling, Sidelying to Sit Rolling: Min assist Sidelying to sit: Min assist       General bed mobility comments: pt spouse providing minA to pull pt to sitting EOB despite instructions of bed mobility with abdominal precautions.    Transfers Overall transfer level: Needs assistance Equipment used: 1 person hand held assist Transfers: Sit to/from Stand Sit to Stand: Min guard           General transfer comment: minG with pt reaching for UE support from spouse. they are generally safe with transfer, slow and guarded due to lines    Ambulation/Gait Ambulation/Gait assistance: Min assist Gait Distance (Feet): 30 Feet Assistive device: 1 person hand held assist Gait Pattern/deviations: Step-to pattern, Decreased step length - right, Decreased step length - left, Decreased stride length Gait velocity: decreased Gait velocity interpretation: <1.31 ft/sec, indicative of household ambulator   General Gait Details: very short, guarded steps with minimal HHA for safety.      Balance Overall balance assessment: Needs assistance Sitting-balance support: Feet supported, Single extremity supported, No upper extremity supported Sitting balance-Leahy Scale: Fair     Standing balance support: Single extremity supported, No upper extremity supported, During functional activity Standing balance-Leahy Scale: Fair Standing balance comment: single UE support with gait, but no overt LOB  Cognition Arousal/Alertness: Awake/alert Behavior During Therapy: Agitated Overall Cognitive Status: Within Functional Limits for tasks assessed                                 General Comments: pt agitated when asked to mobilize, feels like she is getting inconsistent information from staff.        Exercises      General Comments General comments (skin integrity, edema, etc.): HR 100-120 on RA at rest, 120-130 with mobility. Spanish interpreter present during session      Pertinent Vitals/Pain Pain Assessment Pain Assessment: Faces Faces Pain Scale: Hurts little more Pain Location: abdomen Pain Descriptors / Indicators: Discomfort, Sore Pain Intervention(s): Limited activity within patient's tolerance, Monitored during session, Repositioned     PT Goals (current goals can now be found in the care plan section) Acute Rehab PT Goals Patient Stated Goal: rest, feel better PT Goal Formulation: With patient/family Time For Goal Achievement: 04/23/21 Potential to Achieve Goals: Good Progress towards PT goals: Progressing toward goals    Frequency    Min 3X/week      PT Plan Current plan remains appropriate       AM-PAC PT "6 Clicks" Mobility   Outcome Measure  Help needed turning from your back to your side while in a flat bed without using bedrails?: A Little Help needed moving from lying on your back to sitting on the side of a flat bed without using bedrails?: A Little Help needed moving to and from a bed to a chair (including a wheelchair)?: A Little Help needed standing up from a chair using your arms (e.g., wheelchair or bedside chair)?: A Little Help needed to walk in hospital room?: A Little Help needed climbing 3-5 steps with a railing? : A Lot 6 Click Score: 17    End of Session   Activity Tolerance: Patient limited by fatigue;Patient limited by pain Patient left: in bed;with call bell/phone within reach;with bed alarm set Nurse  Communication: Mobility status PT Visit Diagnosis: Other abnormalities of gait and mobility (R26.89);Pain     Time: 5993-5701 PT Time Calculation (min) (ACUTE ONLY): 15 min  Charges:  $Therapeutic Exercise: 8-22 mins                     West Carbo, PT, DPT   Acute Rehabilitation Department Pager #: [phone]   Sandra Cockayne 04/18/2021, 11:00 AM

## 2021-04-18 NOTE — Progress Notes (Signed)
10 Days Post-Op   Subjective/Chief Complaint: Endorses some nausea but no vomiting. Says she is tolerating solid food but has minimal appetite. G tube vented overnight for nausea.   Objective: Vital signs in last 24 hours: Temp:  [97.5 F (36.4 C)-99.1 F (37.3 C)] 98.7 F (37.1 C) (01/23 0500) Pulse Rate:  [98-119] 98 (01/23 0300) Resp:  [14-23] 23 (01/23 0300) BP: (120-148)/(68-83) 121/74 (01/23 0500) SpO2:  [95 %-99 %] 95 % (01/23 0300) Weight:  [53.6 kg] 53.6 kg (01/23 0300) Last BM Date:  (PTA)  Intake/Output from previous day: 01/22 0701 - 01/23 0700 In: 1593.6 [P.O.:120; I.V.:1063.6; IV Piggyback:350] Out: 1100 [Urine:700; Drains:400] Intake/Output this shift: No intake/output data recorded.  PE:   Constitutional: No acute distress, conversant, appears states age. Lungs: normal respiratory effort CV: regular rate and rhythm, rate 100 GI: Soft, no masses or hepatosplenomegaly, non-tender to palpation, inc c/d/I, G/Jtube in place with some drainage around tube, no surrounding erythema or induration Skin: No rashes, palpation reveals normal turgor Psychiatric: appropriate judgment and insight, oriented to person, place, and time  Lab Results:  Recent Labs    04/18/21 0500  WBC 8.1  HGB 7.7*  HCT 23.3*  PLT 324   BMET Recent Labs    04/17/21 0541 04/18/21 0500  NA 136 137  K 4.1 4.3  CL 100 103  CO2 27 27  GLUCOSE 134* 132*  BUN 22 19  CREATININE 0.83 0.89  CALCIUM 8.9 8.7*      Assessment/Plan:  76 yo female with adenocarcinoma of the body of the pancreas causing duodenal obstruction and left hydronephrosis. POD9 s/p GJ bypass, placement of GJ feeding tube, and biopsy of pancreatic mass.  - Stop TPN tonight - Continue G tube clamping prn and soft diet by mouth - Pain control: Continue prn oxycodone and dilaudid, wean dilaudid slowly. - Cellulitis around [place] tube: Resolved, on abx. WBC normal today. - Sinus tachycardia: Continue home diltiazem with  prn metoprolol. Improving, has only needed one dose metoprolol in last 2 days. - Patient needs to ambulate in hallway - VTE: lovenox, SCDs - Dispo: progressive care   LOS: 11 days    Dwan Bolt 04/18/2021

## 2021-04-19 ENCOUNTER — Other Ambulatory Visit: Payer: Self-pay | Admitting: Hematology and Oncology

## 2021-04-19 LAB — GLUCOSE, CAPILLARY
Glucose-Capillary: 119 mg/dL — ABNORMAL HIGH (ref 70–99)
Glucose-Capillary: 136 mg/dL — ABNORMAL HIGH (ref 70–99)

## 2021-04-19 MED ORDER — MAGNESIUM HYDROXIDE 400 MG/5ML PO SUSP
30.0000 mL | Freq: Every day | ORAL | Status: DC
  Filled 2021-04-19: qty 30

## 2021-04-19 MED ORDER — GLYCERIN (LAXATIVE) 2.1 G RE SUPP
1.0000 | Freq: Once | RECTAL | Status: AC
  Administered 2021-04-19: 11:00:00 1 via RECTAL
  Filled 2021-04-19: qty 1

## 2021-04-19 MED ORDER — ACETAMINOPHEN 325 MG PO TABS: 650.0000 mg | ORAL_TABLET | Freq: Four times a day (QID) | ORAL | Status: DC | PRN

## 2021-04-19 MED ORDER — GLYCERIN (LAXATIVE) 2 G RE SUPP
1.0000 | Freq: Once | RECTAL | Status: DC
  Filled 2021-04-19: qty 1

## 2021-04-19 MED ORDER — OXYCODONE HCL 5 MG PO TABS: 5.0000 mg | ORAL_TABLET | ORAL | 0 refills | Status: DC | PRN

## 2021-04-19 MED ORDER — DOCUSATE SODIUM 100 MG PO CAPS: 100.0000 mg | ORAL_CAPSULE | Freq: Two times a day (BID) | ORAL | 0 refills | Status: DC

## 2021-04-19 NOTE — Progress Notes (Signed)
Pt/family given discharge instructions, medication lists, follow up appointments, and when to call the doctor.  Pt/family verbalizes understanding. Graciela interpreted for patient and husband. Payton Emerald, RN

## 2021-04-19 NOTE — Discharge Instructions (Signed)
Feeding tube: If you have nausea, you may open the GASTRIC feeding tube. Drain it into the toilet or sink to relieve pressure in your stomach. If you have to do this more than 2 times in a day, please let Dr. Ayesha Rumpf office know.  You will return to see Dr. Zenia Resides in 1 week in the office. This is at the [facility]. [address] [place], [place], [place] [address]. If you have any questions or concerns before your visit, please call the office at [phone] and ask to speak with Dr. Ayesha Rumpf nurse.

## 2021-04-19 NOTE — Progress Notes (Signed)
11 Days Post-Op   Subjective/Chief Complaint: Patient has been eating most of her meals and denies nausea or vomiting. Afebrile. Continues to have intermittent tachycardia to 110s, has not required prn metoprolol dosing in several days. Ambulated yesterday. She has expressed yesterday and today that she really wants to go home and feels ready.   Objective: Vital signs in last 24 hours: Temp:  [98.3 F (36.8 C)-99.6 F (37.6 C)] 98.3 F (36.8 C) (01/24 0415) Pulse Rate:  [99-112] 99 (01/24 0415) Resp:  [16-20] 17 (01/24 0415) BP: (113-132)/(68-83) 114/79 (01/24 0415) SpO2:  [96 %-100 %] 97 % (01/24 0415) Weight:  [53.4 kg] 53.4 kg (01/24 0415) Last BM Date:  (PTA)  Intake/Output from previous day: 01/23 0701 - 01/24 0700 In: 530 [P.O.:480; IV Piggyback:50] Out: 0  Intake/Output this shift: No intake/output data recorded.  PE:   Constitutional: No acute distress, conversant, appears states age. Lungs: normal respiratory effort CV: regular rate and rhythm, rate 110s GI: Soft, no masses or hepatosplenomegaly, non-tender to palpation, inc c/d/I, G/Jtube in place with some drainage around tube, no surrounding erythema or induration Skin: No rashes, palpation reveals normal turgor Psychiatric: appropriate judgment and insight, oriented to person, place, and time  Lab Results:  Recent Labs    04/18/21 0500  WBC 8.1  HGB 7.7*  HCT 23.3*  PLT 324   BMET Recent Labs    04/17/21 0541 04/18/21 0500  NA 136 137  K 4.1 4.3  CL 100 103  CO2 27 27  GLUCOSE 134* 132*  BUN 22 19  CREATININE 0.83 0.89  CALCIUM 8.9 8.7*      Assessment/Plan:  76 yo female with adenocarcinoma of the body of the pancreas causing duodenal obstruction and left hydronephrosis. POD10 s/p GJ bypass, placement of GJ feeding tube, and biopsy of pancreatic mass.  - Continue soft diet, keep G tube clamped. Vent G tube prn for nausea. - Cellulitis around [place] tube: Resolved on ancef, day 5/7. Will  transition to oral keflex to complete a 7-day course. - Sinus tachycardia: Continue home diltiazem. Improved, suspect some of this is chronic.  - Suppository this morning. Continue colace and resume home milk of magnesia daily. - VTE: lovenox, SCDs - Dispo: Patient has expressed today that she really wants to go home and would feel better at home. Her husband feels she has been very anxious in the hospital and thinks she would do better at home, with the ability to have more food choices that she likes. Patient has excellent family support at home and her husband feels comfortable caring for her and venting her G tube. If she does well this morning with no nausea, I will plan to discharge her home this afternoon with home health nursing and PT. I will see her back in clinic in 1 week, and encouraged her husband to call if she has any issues before then. I will refer them to medical oncology at discharge, they requested to see Dr. Hinton Rao in [place].   LOS: 12 days    Dwan Bolt 04/19/2021

## 2021-04-19 NOTE — TOC Transition Note (Addendum)
Transition of Care (TOC) - CM/SW Discharge Note Marvetta Gibbons RN, BSN Transitions of Care Unit 4E- RN Case Manager See Treatment Team for direct phone #    Patient Details  Name: Leah Pruitt MRN: [id number] Date of Birth: 05/13/1945  Transition of Care [facility]) CM/SW Contact:  Dawayne Patricia, RN Phone Number: 04/19/2021, 10:36 AM   Clinical Narrative:    Pt from home w/ spouse s/p GJ bypass, placement of GJ feeding tube, and biopsy of pancreatic mass. Plan for pt to return home w/ spouse, pt anxious to return home today. Orders have been placed for HHRN/PT.  CM in to speak with pt and spouse at bedside. Pt speaks spanish, spouse speaks english and able to translate. Per spouse pt is stating she wants to go home today, spouse has arranged for someone to transport them home this afternoon and they will come around 230pm to assist home. Discussed HH needs with spouse and provide list for [facility] choice Per CMS guidelines from medicare.gov website with star ratings (copy placed in shadow chart) - spouse has selected [facility] for [facility] needs. Info provided on Medicare coverage and [place] services.  Also discussed DME- per spouse pt has RW at home, they would like 3n1 for home, CM will arrange 3n1 to be delivered to room prior to discharge- spouse agreeable to use in house provider for DME.   Patient is requesting for Name Brand medications on any new scripts for discharge- confirmed preferred pharmacy as [facility] in [place].   Address, phone # and PCP all confirmed in epic.  Call made to [facility] for DME need- 3n1 to be delivered to room prior to discharge.   Call made to [facility] with [facility] for HHRN/PT/ referral- referral has been accepted.    Final next level of care: Home w Home Health Services Barriers to Discharge: No Barriers Identified   Patient Goals and CMS Choice Patient states their goals for this hospitalization and ongoing recovery are:: wants to return home with husband CMS  Medicare.gov Compare Post Acute Care list provided to:: Patient Represenative (must comment) (spouse) Choice offered to / list presented to : Spouse  Discharge Placement               Home w/ [facility]        Discharge Plan and Services   Discharge Planning Services: CM Consult Post Acute Care Choice: Home Health, Durable Medical Equipment          DME Arranged: 3-N-1 DME Agency: [facility] Date DME Agency Contacted: 04/19/21 Time DME Agency Contacted: 1034 Representative spoke with at DME Agency: Freda Munro HH Arranged: RN, PT [facility] Agency: [place] Date [place]: 04/19/21 Time [place]: 1035 Representative spoke with at [place]: [place] ([place]) Interventions     Readmission Risk Interventions Readmission Risk Prevention Plan 04/19/2021  Transportation Screening Complete  PCP or Specialist Appt within 3-5 Days Complete  HRI or [place] Complete  Social Work Consult for [place] Planning/Counseling [place] Not Applicable  Medication Review Press photographer) Complete  Some recent data might be hidden

## 2021-04-20 ENCOUNTER — Other Ambulatory Visit: Payer: Self-pay

## 2021-04-20 ENCOUNTER — Telehealth: Payer: Self-pay | Admitting: Oncology

## 2021-04-20 NOTE — Progress Notes (Signed)
The proposed treatment discussed in conference is for discussion purpose only and is not a binding recommendation.  The patients have not been physically examined, or presented with their treatment options.  Therefore, final treatment plans cannot be decided.  

## 2021-04-20 NOTE — Telephone Encounter (Signed)
Called pt to schedule a follow-up appt with Dr. Hinton Rao per los. Pt refused to come sooner than February 9th, 2023 since she was just discharged yesterday from the hospital.

## 2021-04-25 ENCOUNTER — Telehealth: Payer: Self-pay | Admitting: Oncology

## 2021-04-25 NOTE — Discharge Summary (Signed)
Physician Discharge Summary   Patient ID: Leah Pruitt [id number] 76 y.o. May 09, 1945  Admit date: 04/07/2021  Discharge date and time: 04/19/2021  3:09 PM   Admitting Physician: Dwan Bolt, MD   Discharge Physician: Michaelle Birks, MD  Admission Diagnoses: Pancreatic mass [K86.89], duodenal obstruction, left hydronephrosis  Discharge Diagnoses: Pancreatic adenocarcinoma  Admission Condition: poor  Discharged Condition: stable  Indication for Admission: Ms. Dearman is a 76 yo female who presented with nausea and vomiting, and imaging showed a large mass in the body of the pancreas causing obstruction of the distal duodenum. The patient was seen in consultation in clinic on 1/11 and at that time was having PO intolerance with clinical concern for dehydration. Admission was recommended for hydration, nutrition, and surgical bypass.   Hospital Course: The patient was directly admitted on 04/07/2021.  A PICC line was placed and TPN was initiated and she was given IV fluid hydration.  She was taken to the operating room the following day for exploratory laparotomy and a gastrojejunal bypass for relief of her duodenal obstruction.  The pancreatic mass was clearly involving the duodenum intraoperatively, and biopsy confirmed diagnosis of adenocarcinoma.  A feeding GJ tube was also placed intraoperatively.  Postoperatively the patient was transferred back to the med-surg floor in stable condition.  She developed tachycardia intraoperatively which persisted postoperatively, and extensive work-up showed sinus tachycardia and no signs of active bleeding.  She was transferred to the progressive care unit for further monitoring.  Her G-tube was placed to gravity drainage and J-tube feeds were started and slowly advanced.  She had slow progression with delayed return of bowel function, and on postop day 6 there appeared to be tube feeds in the gastric tube.  A CT scan confirmed that the jejunal limb of  her GJ tube had retracted into the stomach.  There were no intra-abdominal fluid collections.  Tube feeds were stopped and a trial of G-tube clamping was initiated.  Patient was able to tolerate G-tube clamping with intermittent venting.  Her diet was slowly advanced from clear liquids to a soft diet.  She was able to tolerate a soft diet with no vomiting.  Her TPN was weaned off.  Her sinus tachycardia persisted but slowly improved and she was continued on her home diltiazem dosing.  On the morning of 1/24, she was tolerating a soft diet with her G-tube clamped with only occasional requirements for G-tube venting.  She was passing flatus and pain was controlled.  She expressed a strong desire to go home.  She was examined and deemed appropriate for discharge home with home health nursing, and she and her husband were given instructions on venting her G-tube as needed at home.  She will be referred to medical oncology as an outpatient and will have close clinical follow-up with me in 1 week.  They were instructed to call if she required frequent G-tube venting or had p.o. intolerance.  Consults: None  Significant Diagnostic Studies: radiology: CT scan:   CT abd/pelvis 1/19: IMPRESSION: 1. Redemonstration of soft tissue mass at the inferior aspect of the junction of the body and posterior horn of the pancreas suspicious for malignancy. 2. Interval gastrojejunostomy and interval placement of a gastrojejunostomy tube with the tip terminating within the jejunum just distal to the gastrojejunostomy anastomosis. Resolution of the prior stomach and proximal duodenal dilatation from obstruction by the pancreatic mass. 3. Unchanged severe left and mild right hydronephrosis.  Treatments: IV hydration, antibiotics: Ancef, analgesia: acetaminophen, Dilaudid, and  oxycodone, TPN, therapies: PT and OT, and surgery: exploratory laparotomy, biopsy of pancreatic mass, gastrojejunal bypass, placement of feeding GJ  tube  Discharge Exam: Constitutional: No acute distress, conversant, appears states age. Lungs: normal respiratory effort CV: regular rate and rhythm, rate 110s GI: Soft, no masses or hepatosplenomegaly, non-tender to palpation, inc c/d/I, G/Jtube in place with some drainage around tube, no surrounding erythema or induration Skin: No rashes, palpation reveals normal turgor Psychiatric: appropriate judgment and insight, oriented to person, place, and time  Disposition: Discharge disposition: 06-Home-Health Care Svc       Patient Instructions:  Allergies as of 04/19/2021       Reactions   Iodinated Contrast Media Swelling   Pt had swelling after getting IV contrast during an IVP years ago   Naratriptan Anaphylaxis   Rash, difficult time breathing and swallowing   Penicillins Other (See Comments)   Numbness of mouth and dry mouth Tolerated Cephalosporin Date: 04/19/21.     Iodine Swelling   Ciprofloxacin    Other reaction(s): Other (See Comments) Tendon pain   Penicillin G Rash        Medication List     STOP taking these medications    traMADol 50 MG tablet Commonly known as: ULTRAM       TAKE these medications    acetaminophen 325 MG tablet Commonly known as: TYLENOL Take 2 tablets (650 mg total) by mouth every 6 (six) hours as needed for headache or mild pain.   Benicar 20 MG tablet Generic drug: olmesartan Take 20 mg by mouth 2 (two) times daily.   Calcium Carbonate-Vitamin D 600-200 MG-UNIT Tabs Take 1 tablet by mouth 2 (two) times daily with a meal.   diltiazem 120 MG 24 hr capsule Commonly known as: TIAZAC Do not take till you talk with your Medical doctor about her blood pressure What changed:  how much to take how to take this when to take this additional instructions   docusate sodium 100 MG capsule Commonly known as: COLACE Take 1 capsule (100 mg total) by mouth 2 (two) times daily for 14 days.   Glumetza 500 MG (MOD) 24 hr  tablet Generic drug: metFORMIN Take 500 mg by mouth 3 (three) times daily.   magnesium hydroxide 800 MG/5ML suspension Commonly known as: MILK OF MAGNESIA Take 30 mLs by mouth daily.   meclizine 25 MG tablet Commonly known as: ANTIVERT Take 25 mg by mouth daily as needed for dizziness.   Movantik 12.5 MG Tabs tablet Generic drug: naloxegol oxalate Take 12.5 mg by mouth daily as needed (constipation).   nitroGLYCERIN 0.4 MG SL tablet Commonly known as: NITROSTAT Place 0.4 mg under the tongue every 5 (five) minutes as needed for chest pain.   ondansetron 4 MG disintegrating tablet Commonly known as: ZOFRAN-ODT Take 4 mg by mouth every 8 (eight) hours as needed for nausea or vomiting.   oxyCODONE 5 MG immediate release tablet Commonly known as: Oxy IR/ROXICODONE Take 1-2 tablets (5-10 mg total) by mouth every 4 (four) hours as needed for severe pain (5mg  for moderate pain, 10 mg for severe pain). What changed:  how much to take when to take this reasons to take this   pantoprazole 40 MG tablet Commonly known as: PROTONIX Take 1 tablet (40 mg total) by mouth 2 (two) times daily.   prochlorperazine 10 MG tablet Commonly known as: COMPAZINE Take 1 tablet (10 mg total) by mouth every 6 (six) hours as needed for nausea or vomiting.  RABEprazole 20 MG tablet Commonly known as: ACIPHEX Take 20 mg by mouth 2 (two) times daily.   sucralfate 1 GM/10ML suspension Commonly known as: CARAFATE Take 1 g by mouth 2 (two) times daily.       Activity: ambulate in house, no driving while on analgesics, and no heavy lifting for 6 weeks Diet:  Soft diet Wound Care: keep wound clean and dry  Follow-up with Dr. Zenia Resides in 1 weeks. Referral placed to Dr. Hinton Rao (medical oncology).  Signed: Dwan Bolt 04/25/2021 9:43 AM

## 2021-04-25 NOTE — Telephone Encounter (Signed)
Patient's husband called to confirm his wife's upcoming appt. He did ask if we had any sooner appts. I told him we did have an appt available for this Thursday, 04/28/21 but at the end they refused to R/S the appt since the pt is due to go to another appt tomorrow for dressing changes.

## 2021-04-28 ENCOUNTER — Inpatient Hospital Stay (HOSPITAL_COMMUNITY)
Admission: EM | Admit: 2021-04-28 | Discharge: 2021-05-08 | DRG: 871 | Discharge status: Against Medical Advice | Payer: Medicare Other | Attending: Surgery | Admitting: Surgery

## 2021-04-28 ENCOUNTER — Emergency Department (HOSPITAL_COMMUNITY): Payer: Medicare Other

## 2021-04-28 ENCOUNTER — Encounter (HOSPITAL_COMMUNITY): Payer: Self-pay

## 2021-04-28 ENCOUNTER — Other Ambulatory Visit: Payer: Self-pay

## 2021-04-28 DIAGNOSIS — G893 Neoplasm related pain (acute) (chronic): Secondary | ICD-10-CM | POA: Diagnosis present

## 2021-04-28 DIAGNOSIS — F439 Reaction to severe stress, unspecified: Secondary | ICD-10-CM | POA: Diagnosis not present

## 2021-04-28 DIAGNOSIS — Z881 Allergy status to other antibiotic agents status: Secondary | ICD-10-CM

## 2021-04-28 DIAGNOSIS — Z8262 Family history of osteoporosis: Secondary | ICD-10-CM

## 2021-04-28 DIAGNOSIS — E871 Hypo-osmolality and hyponatremia: Secondary | ICD-10-CM

## 2021-04-28 DIAGNOSIS — Z7189 Other specified counseling: Secondary | ICD-10-CM | POA: Diagnosis not present

## 2021-04-28 DIAGNOSIS — K315 Obstruction of duodenum: Secondary | ICD-10-CM | POA: Diagnosis present

## 2021-04-28 DIAGNOSIS — Z515 Encounter for palliative care: Secondary | ICD-10-CM | POA: Diagnosis not present

## 2021-04-28 DIAGNOSIS — R000 Tachycardia, unspecified: Secondary | ICD-10-CM | POA: Diagnosis not present

## 2021-04-28 DIAGNOSIS — I700 Atherosclerosis of aorta: Secondary | ICD-10-CM | POA: Diagnosis not present

## 2021-04-28 DIAGNOSIS — Z9049 Acquired absence of other specified parts of digestive tract: Secondary | ICD-10-CM

## 2021-04-28 DIAGNOSIS — G479 Sleep disorder, unspecified: Secondary | ICD-10-CM | POA: Diagnosis not present

## 2021-04-28 DIAGNOSIS — I10 Essential (primary) hypertension: Secondary | ICD-10-CM | POA: Diagnosis not present

## 2021-04-28 DIAGNOSIS — C259 Malignant neoplasm of pancreas, unspecified: Secondary | ICD-10-CM | POA: Diagnosis present

## 2021-04-28 DIAGNOSIS — E43 Unspecified severe protein-calorie malnutrition: Secondary | ICD-10-CM | POA: Diagnosis present

## 2021-04-28 DIAGNOSIS — Z880 Allergy status to penicillin: Secondary | ICD-10-CM

## 2021-04-28 DIAGNOSIS — Z5320 Procedure and treatment not carried out because of patient's decision for unspecified reasons: Secondary | ICD-10-CM | POA: Diagnosis not present

## 2021-04-28 DIAGNOSIS — D72829 Elevated white blood cell count, unspecified: Secondary | ICD-10-CM | POA: Diagnosis not present

## 2021-04-28 DIAGNOSIS — N1330 Unspecified hydronephrosis: Secondary | ICD-10-CM | POA: Diagnosis present

## 2021-04-28 DIAGNOSIS — Z888 Allergy status to other drugs, medicaments and biological substances status: Secondary | ICD-10-CM | POA: Diagnosis not present

## 2021-04-28 DIAGNOSIS — Z634 Disappearance and death of family member: Secondary | ICD-10-CM | POA: Diagnosis not present

## 2021-04-28 DIAGNOSIS — J189 Pneumonia, unspecified organism: Secondary | ICD-10-CM | POA: Diagnosis present

## 2021-04-28 DIAGNOSIS — E861 Hypovolemia: Secondary | ICD-10-CM | POA: Diagnosis present

## 2021-04-28 DIAGNOSIS — Y95 Nosocomial condition: Secondary | ICD-10-CM | POA: Diagnosis present

## 2021-04-28 DIAGNOSIS — D62 Acute posthemorrhagic anemia: Secondary | ICD-10-CM | POA: Diagnosis not present

## 2021-04-28 DIAGNOSIS — Z20822 Contact with and (suspected) exposure to covid-19: Secondary | ICD-10-CM | POA: Diagnosis present

## 2021-04-28 DIAGNOSIS — E11649 Type 2 diabetes mellitus with hypoglycemia without coma: Secondary | ICD-10-CM | POA: Diagnosis not present

## 2021-04-28 DIAGNOSIS — Z4682 Encounter for fitting and adjustment of non-vascular catheter: Secondary | ICD-10-CM | POA: Diagnosis not present

## 2021-04-28 DIAGNOSIS — R109 Unspecified abdominal pain: Secondary | ICD-10-CM

## 2021-04-28 DIAGNOSIS — K219 Gastro-esophageal reflux disease without esophagitis: Secondary | ICD-10-CM | POA: Diagnosis not present

## 2021-04-28 DIAGNOSIS — K5900 Constipation, unspecified: Secondary | ICD-10-CM | POA: Diagnosis present

## 2021-04-28 DIAGNOSIS — K56609 Unspecified intestinal obstruction, unspecified as to partial versus complete obstruction: Secondary | ICD-10-CM | POA: Diagnosis not present

## 2021-04-28 DIAGNOSIS — K311 Adult hypertrophic pyloric stenosis: Secondary | ICD-10-CM | POA: Diagnosis not present

## 2021-04-28 DIAGNOSIS — A419 Sepsis, unspecified organism: Principal | ICD-10-CM | POA: Diagnosis present

## 2021-04-28 DIAGNOSIS — T85598A Other mechanical complication of other gastrointestinal prosthetic devices, implants and grafts, initial encounter: Secondary | ICD-10-CM

## 2021-04-28 DIAGNOSIS — E1165 Type 2 diabetes mellitus with hyperglycemia: Secondary | ICD-10-CM | POA: Diagnosis present

## 2021-04-28 DIAGNOSIS — Z7984 Long term (current) use of oral hypoglycemic drugs: Secondary | ICD-10-CM

## 2021-04-28 DIAGNOSIS — R638 Other symptoms and signs concerning food and fluid intake: Secondary | ICD-10-CM | POA: Diagnosis not present

## 2021-04-28 DIAGNOSIS — E860 Dehydration: Secondary | ICD-10-CM | POA: Diagnosis present

## 2021-04-28 DIAGNOSIS — Z91041 Radiographic dye allergy status: Secondary | ICD-10-CM

## 2021-04-28 DIAGNOSIS — E8809 Other disorders of plasma-protein metabolism, not elsewhere classified: Secondary | ICD-10-CM | POA: Diagnosis not present

## 2021-04-28 DIAGNOSIS — K9413 Enterostomy malfunction: Secondary | ICD-10-CM | POA: Diagnosis not present

## 2021-04-28 DIAGNOSIS — K6389 Other specified diseases of intestine: Secondary | ICD-10-CM | POA: Diagnosis not present

## 2021-04-28 DIAGNOSIS — R112 Nausea with vomiting, unspecified: Secondary | ICD-10-CM | POA: Diagnosis not present

## 2021-04-28 DIAGNOSIS — G8918 Other acute postprocedural pain: Secondary | ICD-10-CM | POA: Diagnosis not present

## 2021-04-28 DIAGNOSIS — E119 Type 2 diabetes mellitus without complications: Secondary | ICD-10-CM

## 2021-04-28 DIAGNOSIS — C251 Malignant neoplasm of body of pancreas: Secondary | ICD-10-CM | POA: Diagnosis present

## 2021-04-28 DIAGNOSIS — Z833 Family history of diabetes mellitus: Secondary | ICD-10-CM

## 2021-04-28 DIAGNOSIS — R1084 Generalized abdominal pain: Secondary | ICD-10-CM | POA: Diagnosis not present

## 2021-04-28 DIAGNOSIS — Z79899 Other long term (current) drug therapy: Secondary | ICD-10-CM

## 2021-04-28 DIAGNOSIS — Z6821 Body mass index (BMI) 21.0-21.9, adult: Secondary | ICD-10-CM

## 2021-04-28 DIAGNOSIS — Z8249 Family history of ischemic heart disease and other diseases of the circulatory system: Secondary | ICD-10-CM

## 2021-04-28 DIAGNOSIS — N179 Acute kidney failure, unspecified: Secondary | ICD-10-CM | POA: Diagnosis present

## 2021-04-28 DIAGNOSIS — M1990 Unspecified osteoarthritis, unspecified site: Secondary | ICD-10-CM | POA: Diagnosis present

## 2021-04-28 DIAGNOSIS — R7982 Elevated C-reactive protein (CRP): Secondary | ICD-10-CM | POA: Diagnosis not present

## 2021-04-28 LAB — CBC WITH DIFFERENTIAL/PLATELET
Abs Immature Granulocytes: 0.08 10*3/uL — ABNORMAL HIGH (ref 0.00–0.07)
Basophils Absolute: 0 10*3/uL (ref 0.0–0.1)
Basophils Relative: 0 %
Eosinophils Absolute: 0 10*3/uL (ref 0.0–0.5)
Eosinophils Relative: 0 %
HCT: 35.1 % — ABNORMAL LOW (ref 36.0–46.0)
Hemoglobin: 11.4 g/dL — ABNORMAL LOW (ref 12.0–15.0)
Immature Granulocytes: 1 %
Lymphocytes Relative: 5 %
Lymphs Abs: 0.6 10*3/uL — ABNORMAL LOW (ref 0.7–4.0)
MCH: 28.8 pg (ref 26.0–34.0)
MCHC: 32.5 g/dL (ref 30.0–36.0)
MCV: 88.6 fL (ref 80.0–100.0)
Monocytes Absolute: 0.3 10*3/uL (ref 0.1–1.0)
Monocytes Relative: 3 %
Neutro Abs: 11.5 10*3/uL — ABNORMAL HIGH (ref 1.7–7.7)
Neutrophils Relative %: 91 %
Platelets: 657 10*3/uL — ABNORMAL HIGH (ref 150–400)
RBC: 3.96 MIL/uL (ref 3.87–5.11)
RDW: 13.9 % (ref 11.5–15.5)
WBC: 12.4 10*3/uL — ABNORMAL HIGH (ref 4.0–10.5)
nRBC: 0 % (ref 0.0–0.2)

## 2021-04-28 LAB — TROPONIN I (HIGH SENSITIVITY)
Troponin I (High Sensitivity): 36 ng/L — ABNORMAL HIGH (ref <18)
Troponin I (High Sensitivity): 32 ng/L — ABNORMAL HIGH (ref <18)

## 2021-04-28 LAB — LACTIC ACID, PLASMA
Lactic Acid, Venous: 1.3 mmol/L (ref 0.5–1.9)
Lactic Acid, Venous: 1.3 mmol/L (ref 0.5–1.9)

## 2021-04-28 LAB — COMPREHENSIVE METABOLIC PANEL
ALT: 10 U/L (ref 0–44)
AST: 18 U/L (ref 15–41)
Albumin: 4.1 g/dL (ref 3.5–5.0)
Alkaline Phosphatase: 104 U/L (ref 38–126)
Anion gap: 13 (ref 5–15)
BUN: 20 mg/dL (ref 8–23)
CO2: 25 mmol/L (ref 22–32)
Calcium: 10 mg/dL (ref 8.9–10.3)
Chloride: 92 mmol/L — ABNORMAL LOW (ref 98–111)
Creatinine, Ser: 1.45 mg/dL — ABNORMAL HIGH (ref 0.44–1.00)
GFR, Estimated: 38 mL/min — ABNORMAL LOW (ref >60)
Glucose, Bld: 176 mg/dL — ABNORMAL HIGH (ref 70–99)
Potassium: 4.6 mmol/L (ref 3.5–5.1)
Sodium: 130 mmol/L — ABNORMAL LOW (ref 135–145)
Total Bilirubin: 0.5 mg/dL (ref 0.3–1.2)
Total Protein: 7.6 g/dL (ref 6.5–8.1)

## 2021-04-28 LAB — PROTIME-INR
INR: 1.1 (ref 0.8–1.2)
Prothrombin Time: 14.1 seconds (ref 11.4–15.2)

## 2021-04-28 LAB — GLUCOSE, CAPILLARY
Glucose-Capillary: 102 mg/dL — ABNORMAL HIGH (ref 70–99)
Glucose-Capillary: 118 mg/dL — ABNORMAL HIGH (ref 70–99)

## 2021-04-28 LAB — RESP PANEL BY RT-PCR (FLU A&B, COVID) ARPGX2
Influenza A by PCR: NEGATIVE
Influenza B by PCR: NEGATIVE
SARS Coronavirus 2 by RT PCR: NEGATIVE

## 2021-04-28 LAB — LIPASE, BLOOD: Lipase: 21 U/L (ref 11–51)

## 2021-04-28 LAB — APTT: aPTT: 29 seconds (ref 24–36)

## 2021-04-28 IMAGING — DX DG ABDOMEN ACUTE W/ 1V CHEST
3 series · 3 of 3 positions shown · non-contrast
Comparison: [DATE]

CLINICAL DATA: Postoperative, abdominal pain, concern for sepsis

EXAM:
DG ABDOMEN ACUTE WITH 1 VIEW CHEST

[chest pa]
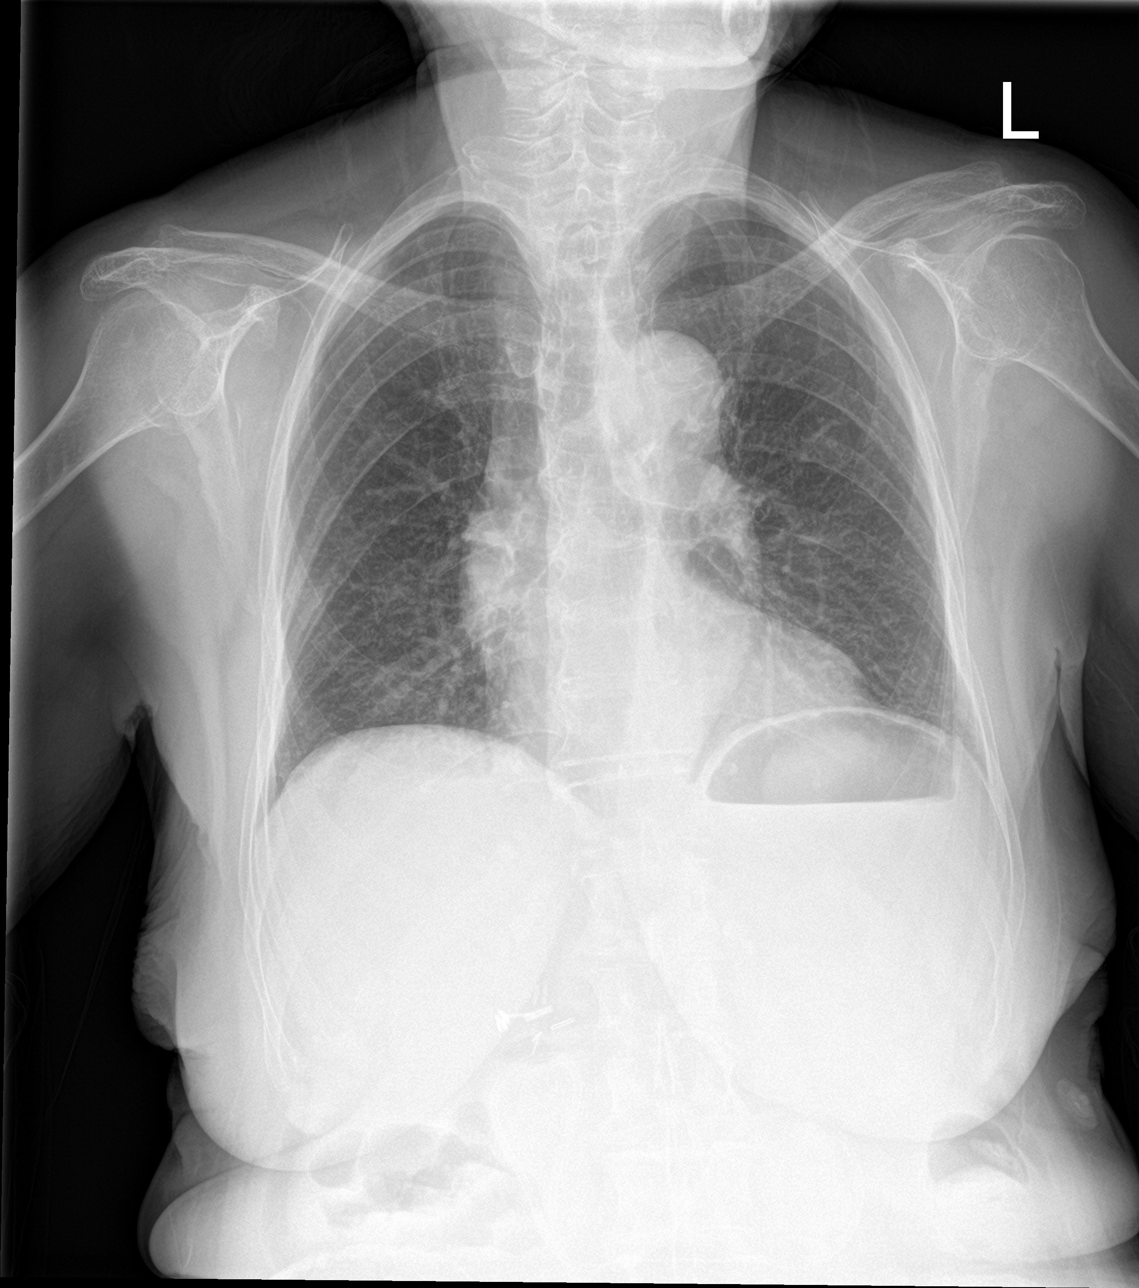

[abdomen erect]
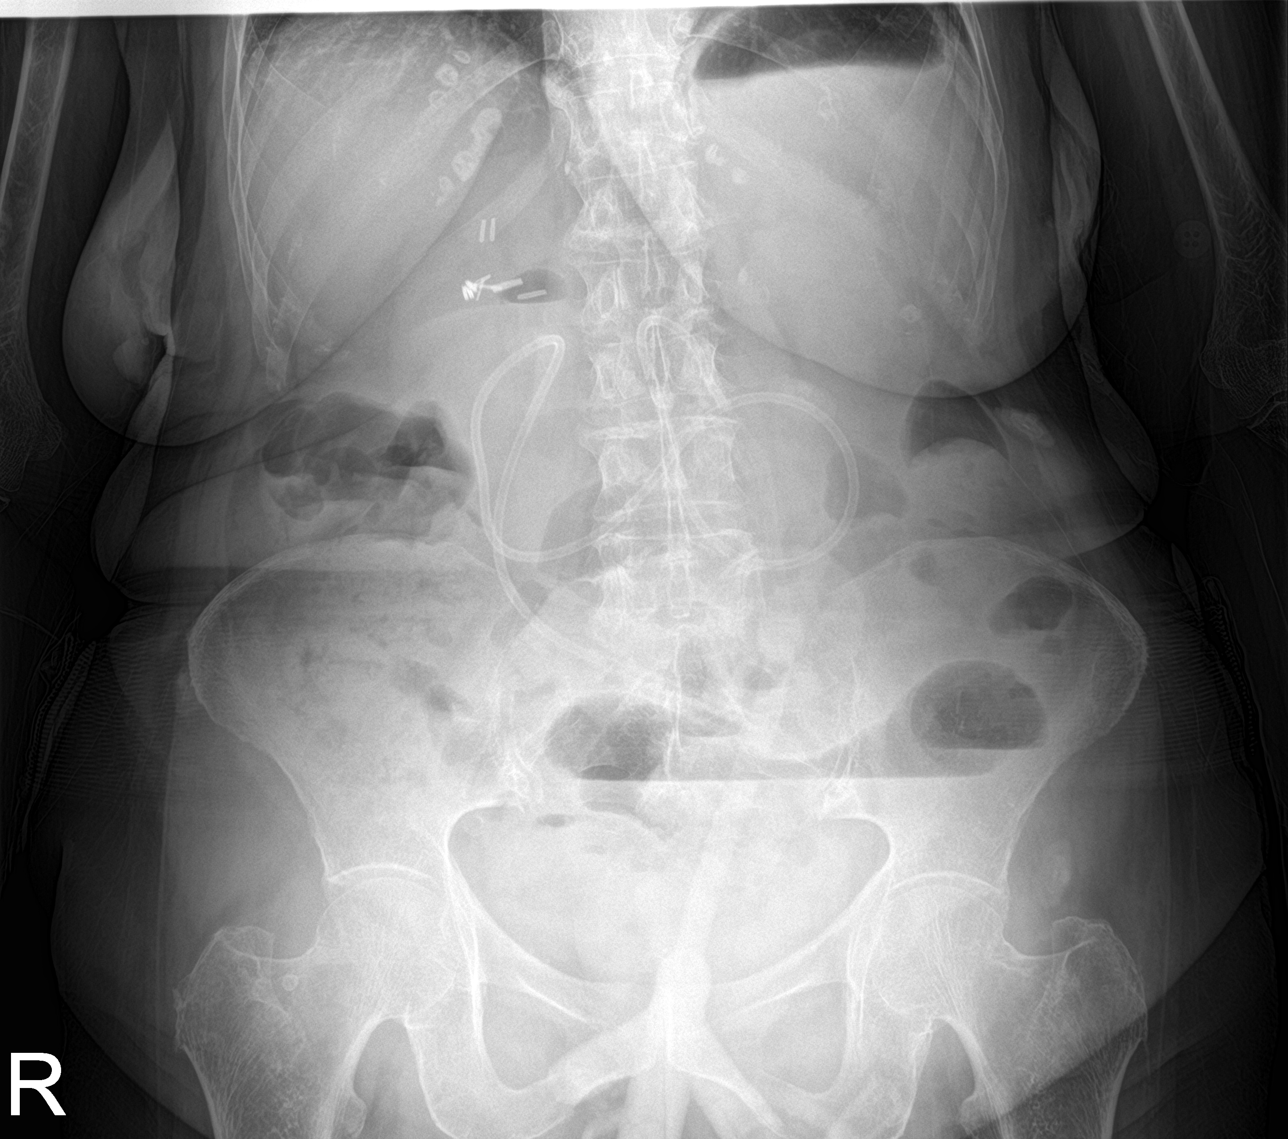

[abdomen supine]
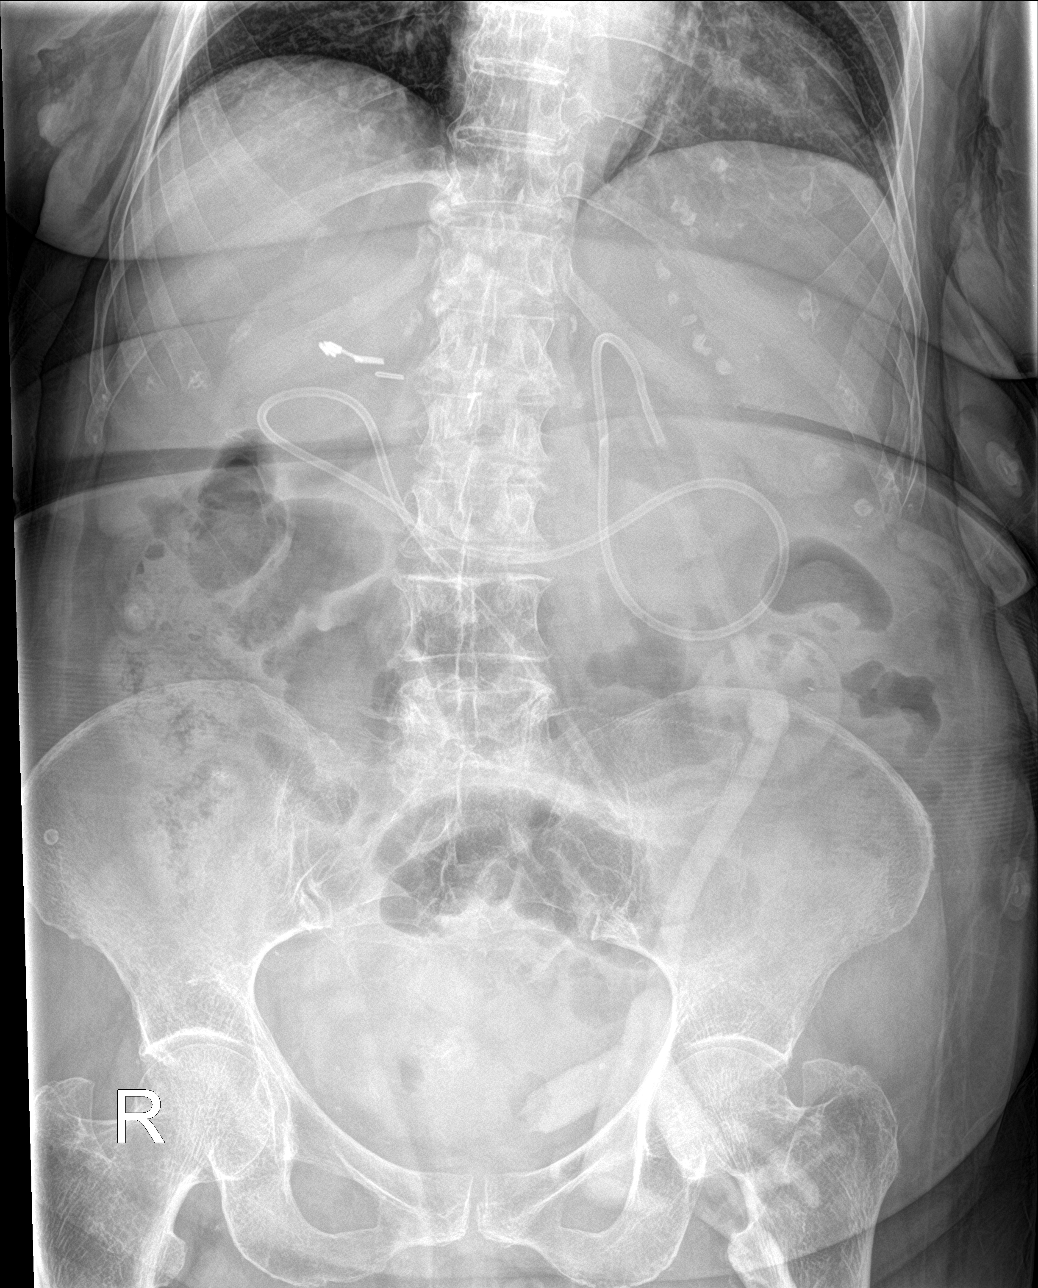

[3 of 3 positions shown; findings below may reference images not displayed]

FINDINGS: Percutaneous gastrojejunostomy tube. Mildly distended air and
fluid-filled loops of small bowel in the low central abdomen,
measuring up to 4.0 cm in caliber. Scattered gas and stool present
to the descending colon. No free air. No radiopaque calculi or other
significant radiographic abnormality is seen. Heart size and
mediastinal contours are within normal limits. Both lungs are clear.
IMPRESSION: 1. Mildly distended air and fluid-filled loops of small bowel in the
low central abdomen, measuring up to 4.0 cm in caliber. Scattered
gas and stool present to the descending colon. Findings suggest
postoperative ileus or developing small bowel obstruction. No free
air.

2.  No acute abnormality of the lungs.

## 2021-04-28 IMAGING — CT CT ABD-PELV W/O CM
2 of 4 series · 15 of 46 positions shown, 17 images · non-contrast
Comparison: [DATE], [DATE]

CLINICAL DATA: Postop, abdominal pain, concern for sepsis



[Series 3: a/p w/o 5mm · axial · non-contrast · 0.69mm/px · z∈[+893,+1253]mm · 12 of 80 slices shown, 14 images]
[im 4/80  soft-tissue]
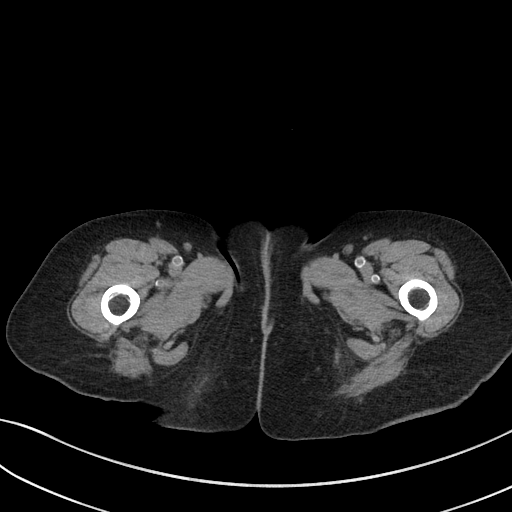
[im 4/80  bone]
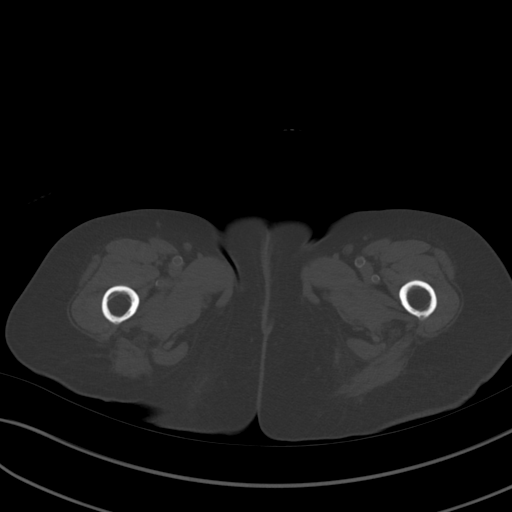
[im 10/80  soft-tissue]
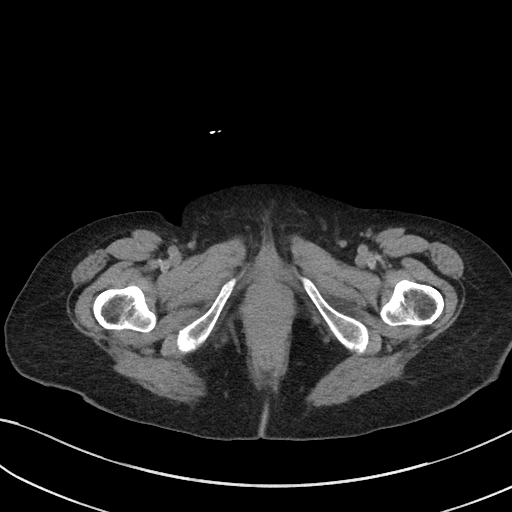
[im 17/80  soft-tissue]
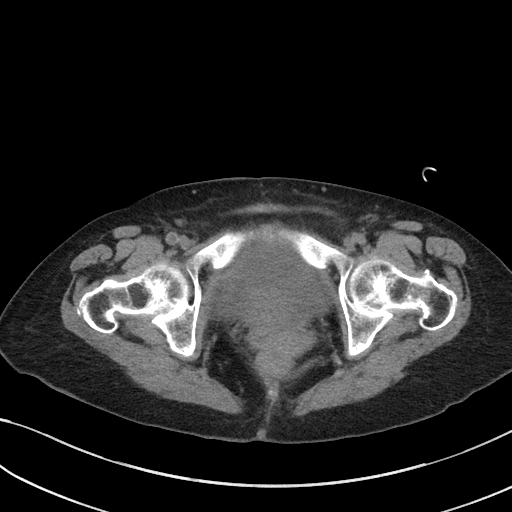
[im 24/80  soft-tissue]
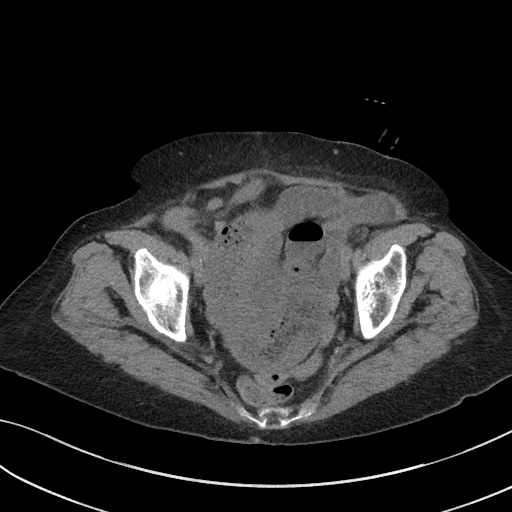
[im 30/80  soft-tissue]
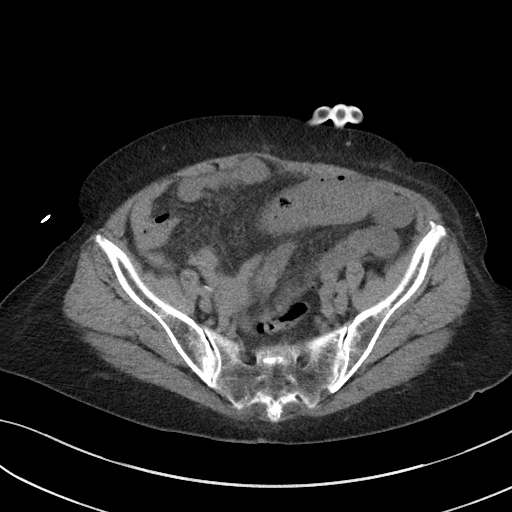
[im 37/80  soft-tissue]
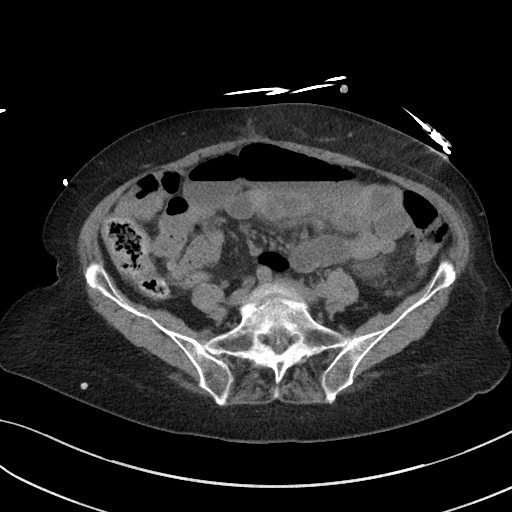
[im 43/80  soft-tissue]
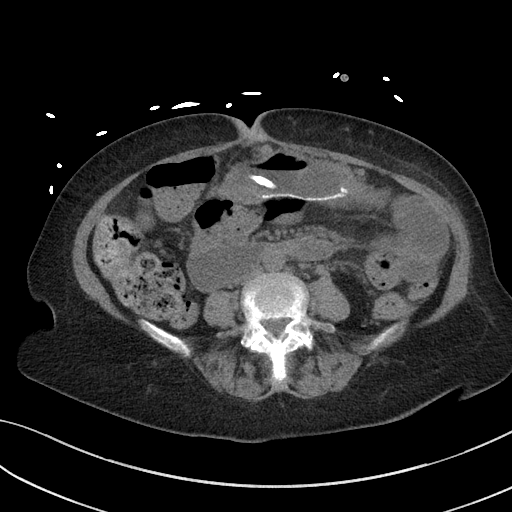
[im 50/80  soft-tissue]
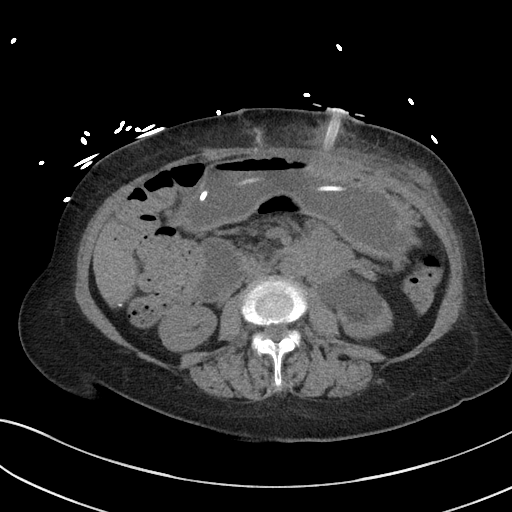
[im 56/80  soft-tissue]
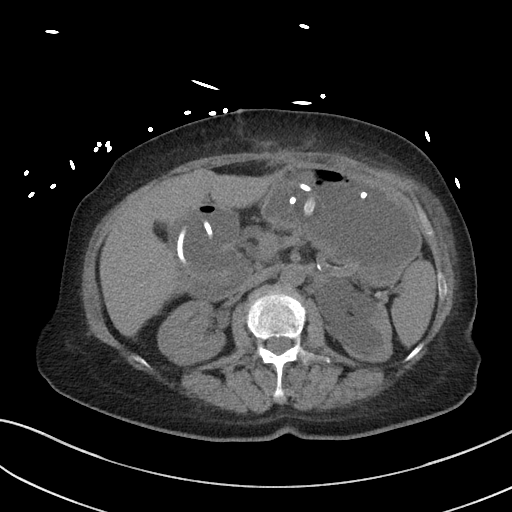
[im 56/80  bone]
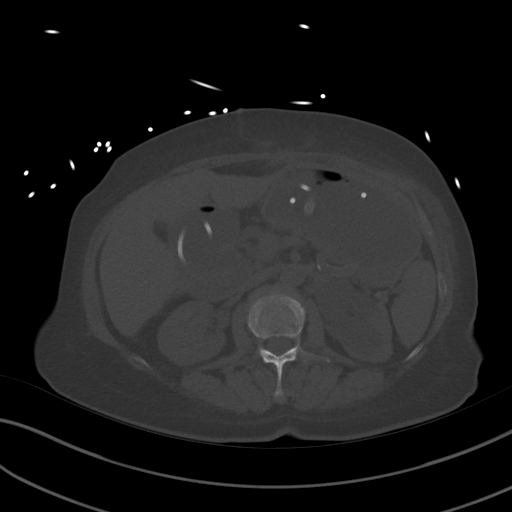
[im 63/80  soft-tissue]
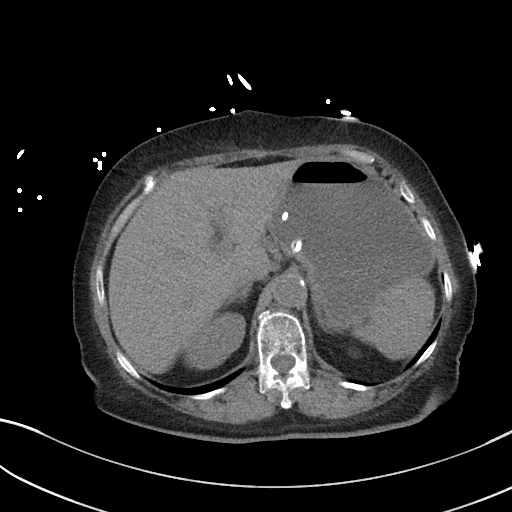
[im 70/80  soft-tissue]
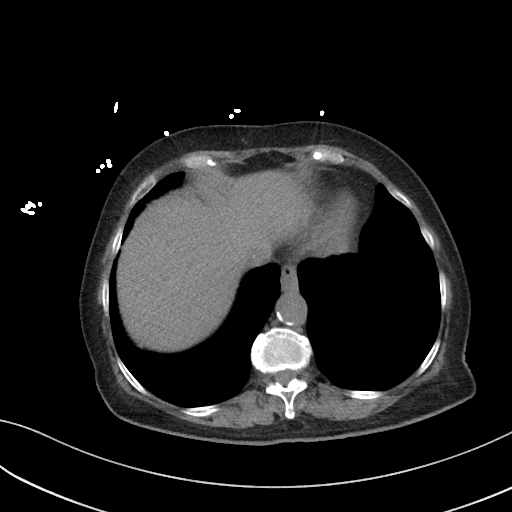
[im 76/80  soft-tissue]
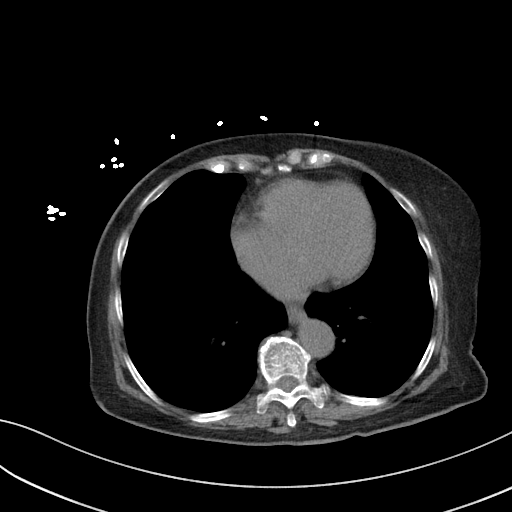

[Series 6: a/p w/o cor · coronal · non-contrast · 0.70mm/px · 3 of 145 slices shown]
[im 49/145  soft-tissue]
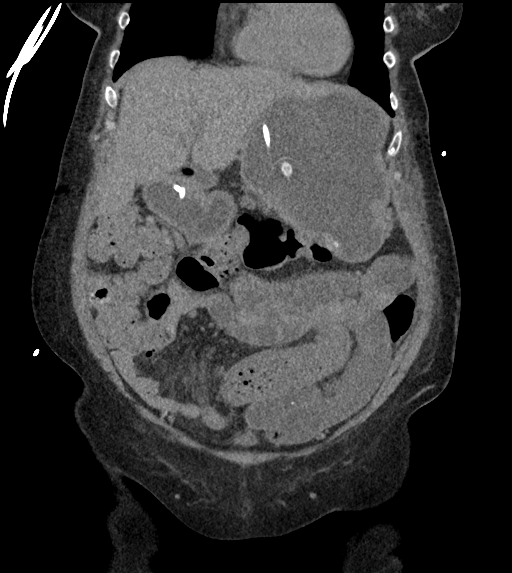
[im 65/145  soft-tissue]
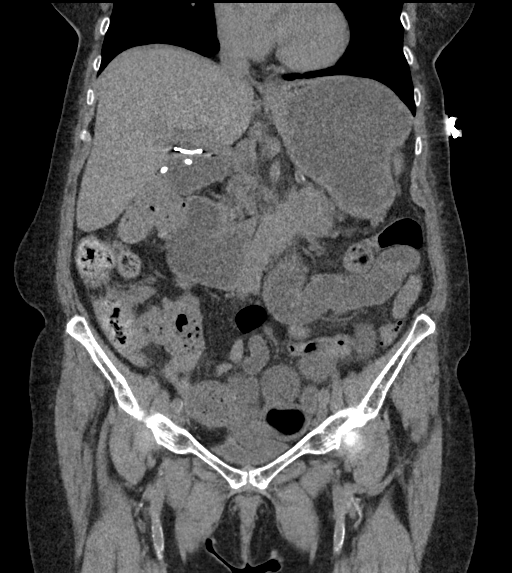
[im 81/145  soft-tissue]
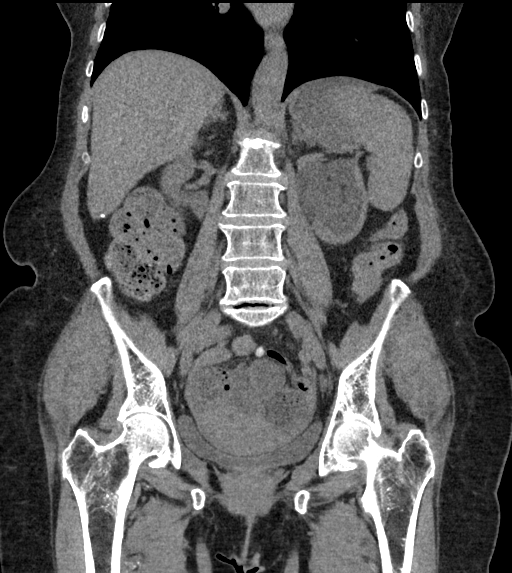

[15 of 46 positions shown; findings below may reference images not displayed]

FINDINGS: Lower chest: No acute pleural or parenchymal lung disease.

Hepatobiliary: Gallbladder is surgically absent. Unremarkable
unenhanced appearance of the liver.

Pancreas: Stable pancreatic atrophy. Ill-defined 3.8 x 3.4 cm soft
tissue mass centered between the pancreatic tail and distal duodenum
again noted, not appreciably changed.

Spleen: Unremarkable unenhanced appearance.

Adrenals/Urinary Tract: Stable severe left-sided hydronephrosis and
left renal atrophy. This is likely due to infiltration or extrinsic
mass effect upon the proximal left ureter by the ill-defined mass
described above. Right kidney is stable. Bladder is decompressed,
limiting its evaluation. The adrenals are unremarkable.

Stomach/Bowel: A percutaneous gastrojejunostomy tube is identified,
distal tip extending through the gastrojejunostomy and residing
within the proximal jejunal limb. There is moderate distention of
the stomach, proximal duodenum, and efferent jejunal limb distal to
the gastrojejunostomy. The distended jejunal limb extends into the
lower pelvis, measuring up to 3.3 cm in diameter. Gas fluid levels
are identified. The distal jejunum and ileum are decompressed.
Minimal retained stool within the colon.

Vascular/Lymphatic: Aortic atherosclerosis. No enlarged abdominal or
pelvic lymph nodes.

Reproductive: Uterus and bilateral adnexa are unremarkable.

Other: No free fluid or free intraperitoneal gas. No abdominal wall
hernia.

Musculoskeletal: No acute or destructive bony lesions. Reconstructed
images demonstrate no additional findings.
IMPRESSION: 1. Postsurgical changes from gastrojejunostomy, with percutaneous
gastrojejunostomy tube extending through the gastrojejunostomy site
and residing within the proximal aspect of the efferent jejunal
limb.
2. Moderate distension of the stomach, proximal duodenum, and
efferent jejunal limb as above, to the level of the mid lower
pelvis. Findings are concerning for developing small bowel
obstruction involving the efferent jejunal limb, versus
postoperative ileus.
3. Stable ill-defined mass centered between the pancreatic tail and
distal duodenum, with mass effect upon the duodenum and obstruction
of the proximal left ureter as described above.
4. Stable severe left hydronephrosis and left renal atrophy.
5.  Aortic Atherosclerosis ([AX]-[AX]).

## 2021-04-28 MED ORDER — DILTIAZEM HCL ER BEADS 120 MG PO CP24
120.0000 mg | ORAL_CAPSULE | Freq: Every day | ORAL | Status: DC
  Filled 2021-04-28: qty 1

## 2021-04-28 MED ORDER — IRBESARTAN 150 MG PO TABS
150.0000 mg | ORAL_TABLET | Freq: Every day | ORAL | Status: DC
  Administered 2021-04-28: 150 mg via ORAL
  Filled 2021-04-28 (×2): qty 1

## 2021-04-28 MED ORDER — SIMETHICONE 80 MG PO CHEW
40.0000 mg | CHEWABLE_TABLET | Freq: Four times a day (QID) | ORAL | Status: DC | PRN
  Administered 2021-05-05: 40 mg via ORAL
  Filled 2021-04-28 (×2): qty 1

## 2021-04-28 MED ORDER — ONDANSETRON 4 MG PO TBDP
4.0000 mg | ORAL_TABLET | Freq: Four times a day (QID) | ORAL | Status: DC | PRN
  Administered 2021-05-02 – 2021-05-03 (×3): 4 mg via ORAL
  Filled 2021-04-28 (×3): qty 1

## 2021-04-28 MED ORDER — ONDANSETRON HCL 4 MG/2ML IJ SOLN
4.0000 mg | Freq: Four times a day (QID) | INTRAMUSCULAR | Status: DC | PRN
  Administered 2021-04-29 – 2021-05-06 (×7): 4 mg via INTRAVENOUS
  Filled 2021-04-28 (×8): qty 2

## 2021-04-28 MED ORDER — INSULIN ASPART 100 UNIT/ML IJ SOLN: 0.0000 [IU] | Freq: Three times a day (TID) | INTRAMUSCULAR | Status: DC

## 2021-04-28 MED ORDER — PROCHLORPERAZINE MALEATE 10 MG PO TABS
10.0000 mg | ORAL_TABLET | Freq: Four times a day (QID) | ORAL | Status: DC | PRN
  Filled 2021-04-28: qty 1

## 2021-04-28 MED ORDER — DILTIAZEM HCL ER COATED BEADS 120 MG PO CP24
120.0000 mg | ORAL_CAPSULE | Freq: Every day | ORAL | Status: DC
  Administered 2021-04-28: 120 mg via ORAL
  Filled 2021-04-28 (×3): qty 1

## 2021-04-28 MED ORDER — METOPROLOL TARTRATE 5 MG/5ML IV SOLN: 5.0000 mg | Freq: Four times a day (QID) | INTRAVENOUS | Status: DC | PRN

## 2021-04-28 MED ORDER — SODIUM CHLORIDE 0.9 % IV SOLN: INTRAVENOUS | Status: DC

## 2021-04-28 MED ORDER — ENOXAPARIN SODIUM 30 MG/0.3ML IJ SOSY
30.0000 mg | PREFILLED_SYRINGE | INTRAMUSCULAR | Status: DC
  Administered 2021-04-28 – 2021-04-30 (×3): 30 mg via SUBCUTANEOUS
  Filled 2021-04-28 (×3): qty 0.3

## 2021-04-28 MED ORDER — ACETAMINOPHEN 650 MG RE SUPP: 650.0000 mg | Freq: Four times a day (QID) | RECTAL | Status: DC | PRN

## 2021-04-28 MED ORDER — ONDANSETRON HCL 4 MG/2ML IJ SOLN
4.0000 mg | Freq: Once | INTRAMUSCULAR | Status: AC
  Administered 2021-04-28: 4 mg via INTRAVENOUS
  Filled 2021-04-28: qty 2

## 2021-04-28 MED ORDER — ENOXAPARIN SODIUM 40 MG/0.4ML IJ SOSY: 40.0000 mg | PREFILLED_SYRINGE | INTRAMUSCULAR | Status: DC

## 2021-04-28 MED ORDER — ACETAMINOPHEN 325 MG PO TABS: 650.0000 mg | ORAL_TABLET | Freq: Four times a day (QID) | ORAL | Status: DC | PRN

## 2021-04-28 MED ORDER — MECLIZINE HCL 25 MG PO TABS
25.0000 mg | ORAL_TABLET | Freq: Every day | ORAL | Status: DC | PRN
  Filled 2021-04-28: qty 1

## 2021-04-28 MED ORDER — DOCUSATE SODIUM 100 MG PO CAPS
100.0000 mg | ORAL_CAPSULE | Freq: Two times a day (BID) | ORAL | Status: DC
  Administered 2021-04-28 – 2021-05-08 (×14): 100 mg via ORAL
  Filled 2021-04-28 (×18): qty 1

## 2021-04-28 MED ORDER — HYDROMORPHONE HCL 1 MG/ML IJ SOLN
0.5000 mg | Freq: Once | INTRAMUSCULAR | Status: AC
  Administered 2021-04-28: 0.5 mg via INTRAVENOUS
  Filled 2021-04-28: qty 1

## 2021-04-28 MED ORDER — SODIUM CHLORIDE 0.9 % IV BOLUS
1000.0000 mL | Freq: Once | INTRAVENOUS | Status: AC
  Administered 2021-04-28: 1000 mL via INTRAVENOUS

## 2021-04-28 MED ORDER — MAGNESIUM HYDROXIDE 400 MG/5ML PO SUSP: 15.0000 mL | Freq: Every day | ORAL | Status: DC | PRN

## 2021-04-28 MED ORDER — PROCHLORPERAZINE EDISYLATE 10 MG/2ML IJ SOLN
5.0000 mg | Freq: Four times a day (QID) | INTRAMUSCULAR | Status: DC | PRN
  Administered 2021-04-28: 5 mg via INTRAVENOUS
  Administered 2021-04-30 (×2): 10 mg via INTRAVENOUS
  Administered 2021-05-02: 5 mg via INTRAVENOUS
  Administered 2021-05-02 – 2021-05-04 (×4): 10 mg via INTRAVENOUS
  Administered 2021-05-05: 5 mg via INTRAVENOUS
  Filled 2021-04-28 (×9): qty 2

## 2021-04-28 MED ORDER — DIPHENHYDRAMINE HCL 50 MG/ML IJ SOLN
12.5000 mg | Freq: Four times a day (QID) | INTRAMUSCULAR | Status: DC | PRN
  Administered 2021-04-30: 12.5 mg via INTRAVENOUS
  Filled 2021-04-28: qty 1

## 2021-04-28 MED ORDER — HYDROMORPHONE HCL 1 MG/ML IJ SOLN
0.5000 mg | INTRAMUSCULAR | Status: DC | PRN
  Administered 2021-04-28 – 2021-05-02 (×15): 1 mg via INTRAVENOUS
  Filled 2021-04-28 (×15): qty 1

## 2021-04-28 MED ORDER — DIPHENHYDRAMINE HCL 12.5 MG/5ML PO ELIX: 12.5000 mg | ORAL_SOLUTION | Freq: Four times a day (QID) | ORAL | Status: DC | PRN

## 2021-04-28 MED ORDER — PANTOPRAZOLE SODIUM 40 MG IV SOLR
40.0000 mg | Freq: Every day | INTRAVENOUS | Status: DC
  Administered 2021-04-28: 40 mg via INTRAVENOUS
  Filled 2021-04-28: qty 40

## 2021-04-28 MED ORDER — PANTOPRAZOLE SODIUM 40 MG IV SOLR: 40.0000 mg | Freq: Every day | INTRAVENOUS | Status: DC

## 2021-04-28 MED ORDER — OXYCODONE HCL 5 MG PO TABS
5.0000 mg | ORAL_TABLET | ORAL | Status: DC | PRN
  Administered 2021-04-29 – 2021-05-05 (×5): 10 mg via ORAL
  Administered 2021-05-05 – 2021-05-07 (×2): 5 mg via ORAL
  Filled 2021-04-28 (×5): qty 2
  Filled 2021-04-28: qty 1
  Filled 2021-04-28: qty 2
  Filled 2021-04-28: qty 1

## 2021-04-28 MED ORDER — BISACODYL 10 MG RE SUPP: 10.0000 mg | Freq: Every day | RECTAL | Status: DC | PRN

## 2021-04-28 MED ORDER — INSULIN ASPART 100 UNIT/ML IJ SOLN: 0.0000 [IU] | Freq: Every day | INTRAMUSCULAR | Status: DC

## 2021-04-28 NOTE — ED Notes (Signed)
Pt is requesting protonix for her GERD. Admitting MD messaged at this time. Will continue to monitor.

## 2021-04-28 NOTE — ED Triage Notes (Addendum)
Pt arrived POV from home c/o abdominal pain and excessive drainage from JP drain. Pt had a pancreatic bypass done about 5-7 days ago. Pt endorses fever. Pt endorses CP that radiates to her back as well.

## 2021-04-28 NOTE — H&P (Signed)
Admission Note  Leah Pruitt 11/24/1945  [id number].    Requesting MD: Dr. Matilde Sprang Chief Complaint/Reason for Consult: nausea/emesis, history of ex lap and gastrojejunal bypass for relief of duodenal obstruction  HPI:  76 yo female with medical history significant for diabetes, GERD, HTN who underwent ex lap, biopsy of pancreatic mass, and gastrojejunal bypass with placement of gastrojejunostomy tube by Dr. Zenia Resides 04/08/21 for duodenal obstruction secondary to a pancreatic mass. Admission following that surgery was significant for CT scan showing that the jejunal limb of her GJ tube had retracted into the stomach without intra-abdominal fluid collections. She had tachycardia during that admission with negative cardiac workup. At time of discharge she was tolerating soft diet with intermittent venting of g-tube.   Over the last 4 days she has had worsening constipation and took milk of magnesia to help. No BM for the last two days. Yesterday she developed worsening nausea, emesis, bloating, and abdominal pain. She has vomited at least 4 times and remains nauseas - non bloody. At time of bloating she had increased thin bilious drainage around G-j tube. Symptoms did not improve with G tube venting. Bloating has now improved. Pain persists and she describes it as 8/10 diffusely but greatest over midline incision and tube site. She has had chills and fever of 101F last night.  She also complains of mid sternal chest pain that radiates to her back that is similar to pain she has had in the past - predating surgery and during last admission. Pain began sometime yesterday and has been constant since.  ROS: Review of Systems  Constitutional:  Positive for chills and fever.  Respiratory:  Negative for cough, shortness of breath and wheezing.   Cardiovascular:  Positive for chest pain. Negative for palpitations and leg swelling.  Gastrointestinal:  Positive for abdominal pain, constipation, nausea  and vomiting. Negative for diarrhea.  Genitourinary: Negative.    Family History  Problem Relation Age of Onset   Stroke Mother 57       deceased   Hypertension Mother    Diabetes Mellitus I Mother    Lung cancer Father 78       deceased   Hypertension Sister    Uterine cancer Sister    Stomach cancer Brother     Past Medical History:  Diagnosis Date   Appendicitis, acute 11/19/2012   Arrhythmia    Arthritis    Back pain    Cardiac arrhythmia    Colles' fracture of right radius, init for clos fx 07/09/2017   Diabetes ([place])    Diverticulosis    FH: cholecystectomy    Gastroesophageal reflux disease    GERD (gastroesophageal reflux disease) 11/19/2012   Headache    HTN (hypertension)    Lipoma    Scalp   Migraines    Osteoporosis    Pain in right wrist 07/09/2017   Unspecified essential hypertension 11/19/2012    Past Surgical History:  Procedure Laterality Date   CESAREAN SECTION     CHOLECYSTECTOMY     GASTROJEJUNOSTOMY N/A 04/08/2021   Procedure: OPEN GASTROJEJUNOSTOMY BYPASS;  Surgeon: Dwan Bolt, MD;  Location: [facility];  Service: General;  Laterality: N/A;   LAPAROSCOPIC APPENDECTOMY N/A 11/16/2012   Procedure: APPENDECTOMY LAPAROSCOPIC;  Surgeon: Imogene Burn. Georgette Dover, MD;  Location: [facility];  Service: General;  Laterality: N/A;   LAPAROSCOPIC LYSIS OF ADHESIONS Bilateral 11/16/2012   Procedure: LAPAROSCOPIC LYSIS OF ADHESIONS;  Surgeon: Imogene Burn. Georgette Dover, MD;  Location: [facility];  Service: General;  Laterality: Bilateral;   TONSILLECTOMY      Social History:  reports that she has never smoked. She has never used smokeless tobacco. She reports that she does not drink alcohol and does not use drugs.  Allergies:  Allergies  Allergen Reactions   Iodinated Contrast Media Swelling    Pt had swelling after getting IV contrast during an IVP years ago   Naratriptan Anaphylaxis    Rash, difficult time breathing and swallowing   Penicillins Other (See Comments)     Numbness of mouth and dry mouth Tolerated Cephalosporin Date: 04/19/21.     Iodine Swelling   Ciprofloxacin     Other reaction(s): Other (See Comments) Tendon pain   Penicillin G Rash    (Not in a hospital admission)   Blood pressure 121/77, pulse (!) 110, temperature 97.9 F (36.6 C), temperature source Oral, resp. rate (!) 22, height 5\' 1"  (1.549 m), weight 50.8 kg, SpO2 100 %. Physical Exam: General: pleasant, WD, female who is laying in bed in NAD HEENT: head is normocephalic, atraumatic.  Sclera are noninjected.  Pupils equal and round. EOMs intact.  Ears and nose without any masses or lesions.  Mouth is pink and moist Heart: tachycardic, regular rhythm.  Normal s1,s2. No obvious murmurs, gallops, or rubs noted.  Palpable radial pulses bilaterally Lungs: CTAB, no wheezes, rhonchi, or rales noted.  Respiratory effort nonlabored Abd: soft, hypoactive BS, diffusely TTP without rebound or guarding and greatest over midline incision and around tube site. G-j tube with light clear green fluid in tubing with both ports clamped. Site with minimal normal discharge and bandage c/d/I Midline surgical incision intact and healing well with surgical glue intact. No erythema or induration MSK: all 4 extremities are symmetrical with no cyanosis, clubbing, or edema. Skin: warm and dry with no masses, lesions, or rashes Neuro: Cranial nerves 2-12 grossly intact, sensation is normal throughout Psych: A&Ox3 with an appropriate affect.    Results for orders placed or performed during the hospital encounter of 04/28/21 (from the past 48 hour(s))  Lipase, blood     Status: None   Collection Time: 04/28/21  1:05 PM  Result Value Ref Range   Lipase 21 11 - 51 U/L    Comment: Performed at [facility], [place] [address] [place], [place] [address]  Comprehensive metabolic panel     Status: Abnormal   Collection Time: 04/28/21  1:05 PM  Result Value Ref Range   Sodium 130 (L) 135 - 145 mmol/L    Potassium 4.6 3.5 - 5.1 mmol/L   Chloride 92 (L) 98 - 111 mmol/L   CO2 25 22 - 32 mmol/L   Glucose, Bld 176 (H) 70 - 99 mg/dL    Comment: Glucose reference range applies only to samples taken after fasting for at least 8 hours.   BUN 20 8 - 23 mg/dL   Creatinine, Ser 1.45 (H) 0.44 - 1.00 mg/dL   Calcium 10.0 8.9 - 10.3 mg/dL   Total Protein 7.6 6.5 - 8.1 g/dL   Albumin 4.1 3.5 - 5.0 g/dL   AST 18 15 - 41 U/L   ALT 10 0 - 44 U/L   Alkaline Phosphatase 104 38 - 126 U/L   Total Bilirubin 0.5 0.3 - 1.2 mg/dL   GFR, Estimated 38 (L) >60 mL/min    Comment: (NOTE) Calculated using the CKD-EPI Creatinine Equation (2021)    Anion gap 13 5 - 15    Comment: Performed at  [facility], [place] [address], [place], [place] [address]  CBC with Differential     Status: Abnormal   Collection Time: 04/28/21  1:05 PM  Result Value Ref Range   WBC 12.4 (H) 4.0 - 10.5 K/uL   RBC 3.96 3.87 - 5.11 MIL/uL   Hemoglobin 11.4 (L) 12.0 - 15.0 g/dL   HCT 35.1 (L) 36.0 - 46.0 %   MCV 88.6 80.0 - 100.0 fL   MCH 28.8 26.0 - 34.0 pg   MCHC 32.5 30.0 - 36.0 g/dL   RDW 13.9 11.5 - 15.5 %   Platelets 657 (H) 150 - 400 K/uL   nRBC 0.0 0.0 - 0.2 %   Neutrophils Relative % 91 %   Neutro Abs 11.5 (H) 1.7 - 7.7 K/uL   Lymphocytes Relative 5 %   Lymphs Abs 0.6 (L) 0.7 - 4.0 K/uL   Monocytes Relative 3 %   Monocytes Absolute 0.3 0.1 - 1.0 K/uL   Eosinophils Relative 0 %   Eosinophils Absolute 0.0 0.0 - 0.5 K/uL   Basophils Relative 0 %   Basophils Absolute 0.0 0.0 - 0.1 K/uL   Immature Granulocytes 1 %   Abs Immature Granulocytes 0.08 (H) 0.00 - 0.07 K/uL    Comment: Performed at [facility] [address] [place], [place] [address]  Lactic acid, plasma     Status: None   Collection Time: 04/28/21  1:29 PM  Result Value Ref Range   Lactic Acid, Venous 1.3 0.5 - 1.9 mmol/L    Comment: Performed at [facility] [address] [place], [place] [address]  APTT     Status: None   Collection  Time: 04/28/21  1:30 PM  Result Value Ref Range   aPTT 29 24 - 36 seconds    Comment: Performed at [facility] [address] [place], [place] [address]   DG Abdomen Acute W/Chest  Result Date: 04/28/2021 CLINICAL DATA:  Postoperative, abdominal pain, concern for sepsis EXAM: DG ABDOMEN ACUTE WITH 1 VIEW CHEST COMPARISON:  04/13/2021 FINDINGS: Percutaneous gastrojejunostomy tube. Mildly distended air and fluid-filled loops of small bowel in the low central abdomen, measuring up to 4.0 cm in caliber. Scattered gas and stool present to the descending colon. No free air. No radiopaque calculi or other significant radiographic abnormality is seen. Heart size and mediastinal contours are within normal limits. Both lungs are clear. IMPRESSION: 1. Mildly distended air and fluid-filled loops of small bowel in the low central abdomen, measuring up to 4.0 cm in caliber. Scattered gas and stool present to the descending colon. Findings suggest postoperative ileus or developing small bowel obstruction. No free air. 2.  No acute abnormality of the lungs. Electronically Signed   By: Delanna Ahmadi M.D.   On: 04/28/2021 13:56      Assessment/Plan Nausea, emesis AKI History of ex lap, biopsy of pancreatic mass, and gastrojejunal bypass with placement of gastrojejunostomy tube by Dr. Zenia Resides 04/08/21  - Lactic acid normal - afebrile, WBC 12.4 - creatinine 1.45 (baseline ~ 0.80). Fluid bolus in ED. Continue IVF and monitor - CT abd/pel non con with moderate distension of stomach, proximal duodenum and efferent jejunal limb to level of pelvis concerning for SBO vs postop ileus Chest pain - possibly chronic? EKG wnl, troponin pending HTN - home meds DM - SSI   FEN: NPO, sips with meds ID: none VTE: lovenox  Dispo: bowel rest, IVF, recheck am labs   Beckie Busing  [facility] Surgery 04/28/2021, 3:14 PM Please see Amion for pager number during day hours 7:00am-4:30pm

## 2021-04-28 NOTE — ED Provider Notes (Signed)
[facility] EMERGENC[facility] DEPARTMENT Provider Note   CSN: [id number] Arrival date & time: 04/28/21  1238     History  Chief Complaint  Patient presents with   Abdominal Pain    Leah Pruitt is a 76 y.o. female.  Leah Pruitt is a 76 y.o. female with history of recent gastrojejunostomy bypass, hypertension, GERD, diabetes, arrhythmia, who presents to the emergency department for evaluation of abdominal pain, vomiting and excessive drainage from JP drain.  Patient had a gastrojejunostomy bypass surgery done by Dr. Ancil Linsey on 04/26/2021. Patient's husband is at bedside and helps to provide history he reports she has had increasing abdominal pain and vomiting numerous times yesterday and twice today.  Temps of 1 to reported at home, given Tylenol last p.m.  Has not been having any has any stool in 3 to 4 days.  Also complains of some central chest pain radiating to the back that started last night, has had these chest pains previously as well, sometimes in the setting of elevated heart rate.  The history is provided by the patient and the spouse.       Home Medications Prior to Admission medications   Medication Sig Start Date End Date Taking? Authorizing Provider  BENICAR 20 MG tablet Take 20 mg by mouth 2 (two) times daily. 11/24/20  Yes [provider]  Calcium Carbonate-Vitamin D 600-200 MG-UNIT TABS Take 1 tablet by mouth 2 (two) times daily with a meal.   Yes [provider]  CAMPHOR EX Apply 1 application topically as needed (pain).   Yes [provider]  Carboxymethylcellulose Sodium (DRY EYE RELIEF OP) Apply 1 drop to eye as needed (Dryness).   Yes [provider]  diltiazem (TIAZAC) 120 MG 24 hr capsule Do not take till you talk with your Medical doctor about her blood pressure Patient taking differently: Take 120 mg by mouth daily. 11/17/12  Yes Earnstine Regal, PA-C  docusate sodium (COLACE) 100 MG capsule Take 1  capsule (100 mg total) by mouth 2 (two) times daily for 14 days. 04/19/21 05/03/21 Yes Dwan Bolt, MD  GLUMETZA 500 MG 24 hr tablet Take 500 mg by mouth 3 (three) times daily. 06/01/20  Yes [provider]  hydrOXYzine (ATARAX) 10 MG tablet Take 10 mg by mouth as needed for anxiety. 04/26/21 05/06/21 Yes [provider]  magnesium hydroxide (MILK OF MAGNESIA) 800 MG/5ML suspension Take 30 mLs by mouth daily.   Yes [provider]  meclizine (ANTIVERT) 25 MG tablet Take 25 mg by mouth daily as needed for dizziness. 08/18/20  Yes [provider]  MOVANTIK 12.5 MG TABS tablet Take 12.5 mg by mouth daily as needed (constipation). 12/31/20  Yes [provider]  nitroGLYCERIN (NITROSTAT) 0.4 MG SL tablet Place 0.4 mg under the tongue every 5 (five) minutes as needed for chest pain.   Yes [provider]  ondansetron (ZOFRAN-ODT) 4 MG disintegrating tablet Take 4 mg by mouth every 8 (eight) hours as needed for nausea or vomiting.   Yes [provider]  oxyCODONE (OXY IR/ROXICODONE) 5 MG immediate release tablet Take 1-2 tablets (5-10 mg total) by mouth every 4 (four) hours as needed for severe pain (5mg  for moderate pain, 10 mg for severe pain). 04/19/21  Yes Dwan Bolt, MD  RABEprazole (ACIPHEX) 20 MG tablet Take 20 mg by mouth 2 (two) times daily.   Yes [provider]  sucralfate (CARAFATE) 1 GM/10ML suspension Take 1 g by mouth 2 (  two) times daily.   Yes [provider]  acetaminophen (TYLENOL) 325 MG tablet Take 2 tablets (650 mg total) by mouth every 6 (six) hours as needed for headache or mild pain. Patient not taking: Reported on 04/28/2021 04/19/21   Dwan Bolt, MD  pantoprazole (PROTONIX) 40 MG tablet Take 1 tablet (40 mg total) by mouth 2 (two) times daily. Patient not taking: Reported on 04/28/2021 03/02/21   Daryel November, MD  prochlorperazine (COMPAZINE) 10 MG tablet Take 1 tablet (10 mg total) by mouth every  6 (six) hours as needed for nausea or vomiting. Patient not taking: Reported on 04/08/2021 04/06/21   Rosanne Sack A, PA-C  traMADol (ULTRAM) 50 MG tablet Take 50-100 mg by mouth 3 (three) times daily as needed. Patient not taking: Reported on 04/28/2021 04/21/21   [provider]      Allergies    Iodinated contrast media, Naratriptan, Penicillins, Iodine, Ciprofloxacin, Duragesic-100 [fentanyl], and Penicillin g    Review of Systems   Review of Systems  Constitutional:  Positive for chills and fever.  HENT: Negative.    Respiratory:  Negative for shortness of breath.   Cardiovascular:  Positive for chest pain.  Gastrointestinal:  Positive for abdominal pain, nausea and vomiting. Negative for diarrhea.  Genitourinary:  Negative for dysuria and frequency.  Musculoskeletal:  Negative for arthralgias and myalgias.  All other systems reviewed and are negative.  Physical Exam Updated Vital Signs BP 119/78    Pulse (!) 112    Temp 97.9 F (36.6 C) (Oral)    Resp (!) 25    Ht 5\' 1"  (1.549 m)    Wt 50.8 kg    SpO2 99%    BMI 21.16 kg/m  Physical Exam Vitals and nursing note reviewed.  Constitutional:      General: She is not in acute distress.    Appearance: Normal appearance. She is well-developed. She is ill-appearing. She is not diaphoretic.  HENT:     Head: Normocephalic and atraumatic.  Eyes:     General:        Right eye: No discharge.        Left eye: No discharge.     Pupils: Pupils are equal, round, and reactive to light.  Cardiovascular:     Rate and Rhythm: Regular rhythm. Tachycardia present.     Pulses: Normal pulses.     Heart sounds: Normal heart sounds.  Pulmonary:     Effort: Pulmonary effort is normal. No respiratory distress.     Breath sounds: Normal breath sounds. No wheezing or rales.     Comments: Respirations equal and unlabored, patient able to speak in full sentences, lungs clear to auscultation bilaterally  Abdominal:     General: Bowel sounds  are decreased.     Palpations: There is no mass.     Tenderness: There is abdominal tenderness. There is no guarding.     Comments: Abdomen is mildly distended, JP drain present in the left mid abdomen with a moderate amount of cloudy serous drainage, decreased bowel sounds noted throughout, generalized abdominal tenderness.  Musculoskeletal:        General: No deformity.     Cervical back: Neck supple.  Skin:    General: Skin is warm and dry.     Capillary Refill: Capillary refill takes less than 2 seconds.  Neurological:     Mental Status: She is alert and oriented to person, place, and time.     Coordination: Coordination normal.  Comments: Speech is clear, able to follow commands Moves extremities without ataxia, coordination intact  Psychiatric:        Mood and Affect: Mood normal.        Behavior: Behavior normal.    ED Results / Procedures / Treatments   Labs (all labs ordered are listed, but only abnormal results are displayed) Labs Reviewed  COMPREHENSIVE METABOLIC PANEL - Abnormal; Notable for the following components:      Result Value   Sodium 130 (*)    Chloride 92 (*)    Glucose, Bld 176 (*)    Creatinine, Ser 1.45 (*)    GFR, Estimated 38 (*)    All other components within normal limits  CBC WITH DIFFERENTIAL/PLATELET - Abnormal; Notable for the following components:   WBC 12.4 (*)    Hemoglobin 11.4 (*)    HCT 35.1 (*)    Platelets 657 (*)    Neutro Abs 11.5 (*)    Lymphs Abs 0.6 (*)    Abs Immature Granulocytes 0.08 (*)    All other components within normal limits  TROPONIN I (HIGH SENSITIVITY) - Abnormal; Notable for the following components:   Troponin I (High Sensitivity) 36 (*)    All other components within normal limits  RESP PANEL BY RT-PCR (FLU A&B, COVID) ARPGX2  CULTURE, BLOOD (ROUTINE X 2)  CULTURE, BLOOD (ROUTINE X 2)  URINE CULTURE  LIPASE, BLOOD  LACTIC ACID, PLASMA  LACTIC ACID, PLASMA  PROTIME-INR  APTT  URINALYSIS, ROUTINE W  REFLEX MICROSCOPIC  BASIC METABOLIC PANEL  CBC  MAGNESIUM  PHOSPHORUS  TROPONIN I (HIGH SENSITIVITY)    EKG None  Radiology CT ABDOMEN PELVIS WO CONTRAST  Result Date: 04/28/2021 CLINICAL DATA:  Postop, abdominal pain, concern for sepsis EXAM: CT ABDOMEN AND PELVIS WITHOUT CONTRAST TECHNIQUE: Multidetector CT imaging of the abdomen and pelvis was performed following the standard protocol without IV contrast. RADIATION DOSE REDUCTION: This exam was performed according to the departmental dose-optimization program which includes automated exposure control, adjustment of the mA and/or kV according to patient size and/or use of iterative reconstruction technique. COMPARISON:  04/28/2021, 04/14/2021 FINDINGS: Lower chest: No acute pleural or parenchymal lung disease. Hepatobiliary: Gallbladder is surgically absent. Unremarkable unenhanced appearance of the liver. Pancreas: Stable pancreatic atrophy. Ill-defined 3.8 x 3.4 cm soft tissue mass centered between the pancreatic tail and distal duodenum again noted, not appreciably changed. Spleen: Unremarkable unenhanced appearance. Adrenals/Urinary Tract: Stable severe left-sided hydronephrosis and left renal atrophy. This is likely due to infiltration or extrinsic mass effect upon the proximal left ureter by the ill-defined mass described above. Right kidney is stable. Bladder is decompressed, limiting its evaluation. The adrenals are unremarkable. Stomach/Bowel: A percutaneous gastrojejunostomy tube is identified, distal tip extending through the gastrojejunostomy and residing within the proximal jejunal limb. There is moderate distention of the stomach, proximal duodenum, and efferent jejunal limb distal to the gastrojejunostomy. The distended jejunal limb extends into the lower pelvis, measuring up to 3.3 cm in diameter. Gas fluid levels are identified. The distal jejunum and ileum are decompressed. Minimal retained stool within the colon. Vascular/Lymphatic:  Aortic atherosclerosis. No enlarged abdominal or pelvic lymph nodes. Reproductive: Uterus and bilateral adnexa are unremarkable. Other: No free fluid or free intraperitoneal gas. No abdominal wall hernia. Musculoskeletal: No acute or destructive bony lesions. Reconstructed images demonstrate no additional findings. IMPRESSION: 1. Postsurgical changes from gastrojejunostomy, with percutaneous gastrojejunostomy tube extending through the gastrojejunostomy site and residing within the proximal aspect of the efferent jejunal limb.  2. Moderate distension of the stomach, proximal duodenum, and efferent jejunal limb as above, to the level of the mid lower pelvis. Findings are concerning for developing small bowel obstruction involving the efferent jejunal limb, versus postoperative ileus. 3. Stable ill-defined mass centered between the pancreatic tail and distal duodenum, with mass effect upon the duodenum and obstruction of the proximal left ureter as described above. 4. Stable severe left hydronephrosis and left renal atrophy. 5.  Aortic Atherosclerosis (ICD10-I70.0). Electronically Signed   By: Randa Ngo M.D.   On: 04/28/2021 15:45   DG Abdomen Acute W/Chest  Result Date: 04/28/2021 CLINICAL DATA:  Postoperative, abdominal pain, concern for sepsis EXAM: DG ABDOMEN ACUTE WITH 1 VIEW CHEST COMPARISON:  04/13/2021 FINDINGS: Percutaneous gastrojejunostomy tube. Mildly distended air and fluid-filled loops of small bowel in the low central abdomen, measuring up to 4.0 cm in caliber. Scattered gas and stool present to the descending colon. No free air. No radiopaque calculi or other significant radiographic abnormality is seen. Heart size and mediastinal contours are within normal limits. Both lungs are clear. IMPRESSION: 1. Mildly distended air and fluid-filled loops of small bowel in the low central abdomen, measuring up to 4.0 cm in caliber. Scattered gas and stool present to the descending colon. Findings suggest  postoperative ileus or developing small bowel obstruction. No free air. 2.  No acute abnormality of the lungs. Electronically Signed   By: Delanna Ahmadi M.D.   On: 04/28/2021 13:56    Procedures Procedures    Medications Ordered in ED Medications  enoxaparin (LOVENOX) injection 40 mg (has no administration in time range)  0.9 %  sodium chloride infusion (has no administration in time range)  metoprolol tartrate (LOPRESSOR) injection 5 mg (has no administration in time range)  simethicone (MYLICON) chewable tablet 40 mg (has no administration in time range)  ondansetron (ZOFRAN-ODT) disintegrating tablet 4 mg (has no administration in time range)    Or  ondansetron (ZOFRAN) injection 4 mg (has no administration in time range)  prochlorperazine (COMPAZINE) tablet 10 mg ( Oral See Alternative 04/28/21 1825)    Or  prochlorperazine (COMPAZINE) injection 5-10 mg (5 mg Intravenous Given 04/28/21 1825)  diphenhydrAMINE (BENADRYL) 12.5 MG/5ML elixir 12.5 mg (has no administration in time range)    Or  diphenhydrAMINE (BENADRYL) injection 12.5 mg (has no administration in time range)  acetaminophen (TYLENOL) tablet 650 mg (has no administration in time range)    Or  acetaminophen (TYLENOL) suppository 650 mg (has no administration in time range)  docusate sodium (COLACE) capsule 100 mg (has no administration in time range)  bisacodyl (DULCOLAX) suppository 10 mg (has no administration in time range)  magnesium hydroxide (MILK OF MAGNESIA) suspension 15 mL (has no administration in time range)  pantoprazole (PROTONIX) injection 40 mg (has no administration in time range)  oxyCODONE (Oxy IR/ROXICODONE) immediate release tablet 5-10 mg (has no administration in time range)  meclizine (ANTIVERT) tablet 25 mg (has no administration in time range)  irbesartan (AVAPRO) tablet 150 mg (has no administration in time range)  HYDROmorphone (DILAUDID) injection 0.5-1 mg (1 mg Intravenous Given 04/28/21 1621)   insulin aspart (novoLOG) injection 0-5 Units (has no administration in time range)  insulin aspart (novoLOG) injection 0-15 Units (has no administration in time range)  diltiazem (CARDIZEM CD) 24 hr capsule 120 mg (has no administration in time range)  sodium chloride 0.9 % bolus 1,000 mL (0 mLs Intravenous Stopped 04/28/21 1747)  ondansetron (ZOFRAN) injection 4 mg (4 mg Intravenous Given 04/28/21  1534)  HYDROmorphone (DILAUDID) injection 0.5 mg (0.5 mg Intravenous Given 04/28/21 1534)    ED Course/ Medical Decision Making/ A&P                           This patient presents to the ED for concern of restrained, this involves an extensive number of treatment options, and is a complaint that carries with it a high risk of complications and morbidity.  The differential diagnosis includes bowel obstruction, ileus, postop infection, abscess, perforation, drain displacement   Co morbidities that complicate the patient evaluation  Diabetes, hypertension, hyperlipidemia,   Additional history obtained:  Additional history obtained from husband at bedside External records from outside source obtained and reviewed including recent surgical procedure   Lab Tests:  I Ordered, and personally interpreted labs.  The pertinent results include: Mild leukocytosis of 12.4, stable hemoglobin, increase in creatinine and mild hyponatremia, likely in the setting of dehydration, normal liver function and lipase.  Lactic acid is WNL.  Troponin is mildly elevated at 36, suspect this may be in the setting of acute illness and recent surgery, patient without EKG changes, chest pain may be more so related to GERD and frequent episodes of vomiting.  Will check delta troponin   Imaging Studies ordered:  I ordered imaging studies including acute abdominal series and CT abdomen pelvis I independently visualized and interpreted imaging which showed x-ray shows likely postoperative ileus or small bowel obstruction, no  acute cardiopulmonary disease.  CT ordered without contrast as patient has contrast allergy and cannot tolerate oral contrast.  Postsurgical changes from recent gastrojejunostomy with drain in place, moderate distention of the stomach, proximal duodenum and jejunum, concerning for small bowel obstruction, mass between the pancreatic tail and duodenum causing compression to the duodenum and left ureter with hydronephrosis I agree with the radiologist interpretation   Cardiac Monitoring:  The patient was maintained on a cardiac monitor.  I personally viewed and interpreted the cardiac monitored which showed an underlying rhythm of: Sinus tachycardia   Medicines ordered and prescription drug management:  I ordered medication including IV fluids for mild increase in creatinine, IV Dilaudid and Zofran for symptomatic management Reevaluation of the patient after these medicines showed that the patient improved I have reviewed the patients home medicines and have made adjustments as needed   Consultations Obtained:  I requested consultation with the general surgery, spoke with PA Richard Miu and discussed lab and imaging findings as well as pertinent plan - they recommend: CT is removable and they will plan to admit the patient to general surgery service.   Problem List / ED Course:  Small bowel obstruction versus postoperative ileus Chest pain, suspect this is most likely in the setting of repeated vomiting, initial troponin mildly elevated without EKG changes, delta troponin pending, chest x-ray clear Surgery at bedside and will plan to admit.   Reevaluation:  After the interventions noted above, I reevaluated the patient and found that they have :improved   Social Determinants of Health:  Language barrier, translator used, and husband at bedside is also able to translate and is patient's primary caregiver   Dispostion:  After consideration of the diagnostic results and the  patients response to treatment, I feel that the patent would benefit from admission.          Final Clinical Impression(s) / ED Diagnoses Final diagnoses:  SBO (small bowel obstruction) ([facility])    Rx / DC Orders ED Discharge  Orders     None         Janet Berlin 04/28/21 1847    Teressa Lower, MD 04/29/21 [phone]

## 2021-04-28 NOTE — ED Provider Triage Note (Signed)
Emergency Medicine Provider Triage Evaluation Note  Brigida Scotti , a 76 y.o. female  was evaluated in triage.  Pt complains ofi increased drainage from JP drain, She vomited multiple times yesterday and twice today.  Yesterday temps of 101/102.   Her husband gave her tylenol at about 7am.   No diarrhea. No BM in 3-4 days.   Physical Exam  BP 127/89 (BP Location: Right Arm)    Pulse (!) 119    Temp 97.9 F (36.6 C) (Oral)    Resp (!) 22    Ht 5\' 1"  (1.549 m)    Wt 50.8 kg    SpO2 100%    BMI 21.16 kg/m  Gen:   Awake, appears uncomfortable.  Resp:  Normal effort  MSK:   Moves extremities without difficulty  Other:  Has drain in abdomen.   Medical Decision Making  Medically screening exam initiated at 1:10 PM.  Appropriate orders placed.  Sindia Hoeschen was informed that the remainder of the evaluation will be completed by another provider, this initial triage assessment does not replace that evaluation, and the importance of remaining in the ED until their evaluation is complete.  I made charge RN aware that I was concerned patient may be septic with her fevers, tachycardia, and discomfort.  She will be roomed when possible.      Lorin Glass, [place] 04/28/21 2153

## 2021-04-29 DIAGNOSIS — K56609 Unspecified intestinal obstruction, unspecified as to partial versus complete obstruction: Secondary | ICD-10-CM

## 2021-04-29 DIAGNOSIS — D72829 Elevated white blood cell count, unspecified: Secondary | ICD-10-CM

## 2021-04-29 DIAGNOSIS — C259 Malignant neoplasm of pancreas, unspecified: Secondary | ICD-10-CM

## 2021-04-29 DIAGNOSIS — E119 Type 2 diabetes mellitus without complications: Secondary | ICD-10-CM

## 2021-04-29 DIAGNOSIS — I10 Essential (primary) hypertension: Secondary | ICD-10-CM

## 2021-04-29 DIAGNOSIS — K219 Gastro-esophageal reflux disease without esophagitis: Secondary | ICD-10-CM

## 2021-04-29 LAB — CBC
HCT: 30.2 % — ABNORMAL LOW (ref 36.0–46.0)
Hemoglobin: 9.7 g/dL — ABNORMAL LOW (ref 12.0–15.0)
MCH: 28.4 pg (ref 26.0–34.0)
MCHC: 32.1 g/dL (ref 30.0–36.0)
MCV: 88.3 fL (ref 80.0–100.0)
Platelets: 504 10*3/uL — ABNORMAL HIGH (ref 150–400)
RBC: 3.42 MIL/uL — ABNORMAL LOW (ref 3.87–5.11)
RDW: 14 % (ref 11.5–15.5)
WBC: 11 10*3/uL — ABNORMAL HIGH (ref 4.0–10.5)
nRBC: 0 % (ref 0.0–0.2)

## 2021-04-29 LAB — BASIC METABOLIC PANEL
Anion gap: 9 (ref 5–15)
BUN: 20 mg/dL (ref 8–23)
CO2: 22 mmol/L (ref 22–32)
Calcium: 8.9 mg/dL (ref 8.9–10.3)
Chloride: 99 mmol/L (ref 98–111)
Creatinine, Ser: 1.26 mg/dL — ABNORMAL HIGH (ref 0.44–1.00)
GFR, Estimated: 45 mL/min — ABNORMAL LOW (ref >60)
Glucose, Bld: 123 mg/dL — ABNORMAL HIGH (ref 70–99)
Potassium: 4.6 mmol/L (ref 3.5–5.1)
Sodium: 130 mmol/L — ABNORMAL LOW (ref 135–145)

## 2021-04-29 LAB — GLUCOSE, CAPILLARY
Glucose-Capillary: 100 mg/dL — ABNORMAL HIGH (ref 70–99)
Glucose-Capillary: 93 mg/dL (ref 70–99)
Glucose-Capillary: 100 mg/dL — ABNORMAL HIGH (ref 70–99)
Glucose-Capillary: 89 mg/dL (ref 70–99)

## 2021-04-29 LAB — MAGNESIUM: Magnesium: 2.1 mg/dL (ref 1.7–2.4)

## 2021-04-29 LAB — PREALBUMIN: Prealbumin: 23.9 mg/dL (ref 18–38)

## 2021-04-29 LAB — PHOSPHORUS: Phosphorus: 4.4 mg/dL (ref 2.5–4.6)

## 2021-04-29 MED ORDER — GLYCERIN (LAXATIVE) 2 G RE SUPP
1.0000 | Freq: Once | RECTAL | Status: AC
  Administered 2021-04-29: 1 via RECTAL
  Filled 2021-04-29: qty 1

## 2021-04-29 MED ORDER — ALBUMIN HUMAN 5 % IV SOLN
25.0000 g | Freq: Once | INTRAVENOUS | Status: AC
  Administered 2021-04-29: 25 g via INTRAVENOUS
  Filled 2021-04-29: qty 500

## 2021-04-29 MED ORDER — PANTOPRAZOLE SODIUM 40 MG IV SOLR
40.0000 mg | Freq: Two times a day (BID) | INTRAVENOUS | Status: DC
  Administered 2021-04-29 – 2021-05-02 (×8): 40 mg via INTRAVENOUS
  Filled 2021-04-29 (×9): qty 40

## 2021-04-29 MED ORDER — INSULIN ASPART 100 UNIT/ML IJ SOLN
0.0000 [IU] | Freq: Three times a day (TID) | INTRAMUSCULAR | Status: DC
  Administered 2021-05-01 – 2021-05-03 (×2): 1 [IU] via SUBCUTANEOUS
  Administered 2021-05-04: 2 [IU] via SUBCUTANEOUS
  Administered 2021-05-04 – 2021-05-06 (×3): 1 [IU] via SUBCUTANEOUS
  Administered 2021-05-06: 2 [IU] via SUBCUTANEOUS
  Administered 2021-05-07: 1 [IU] via SUBCUTANEOUS
  Administered 2021-05-07: 2 [IU] via SUBCUTANEOUS
  Administered 2021-05-07: 1 [IU] via SUBCUTANEOUS
  Administered 2021-05-08: 2 [IU] via SUBCUTANEOUS

## 2021-04-29 NOTE — Evaluation (Signed)
Physical Therapy Evaluation Patient Details Name: Leah Pruitt MRN: [id number] DOB: 1946-02-07 Today's Date: 04/29/2021  History of Present Illness  Pt. is a 76 yr old F admitted on 04/28/21 with c/o abd pain and excessive drainage from JP drain s/p recent pancreatic bypass (~7 days ago). Imaging (+) for SBO. PMH: arthritis, DM, HTN, migraines, osteoporosis, Colles' fx R radius, gastrojejunostomy bypass, pancreatic CA  Clinical Impression  Pt was previously mod I with use of RW for functional mobility and ADLs prior to returning to hospital for SBO.  Pt currently is demonstrating rolling with independence and verbally states she has been able to mobilize to bathroom in hospital without difficulty, but declines to demonstrate/practice at this time due to 10/10 abdominal pain.  PT to f/u during acute care stay in order to observe OOB activity and provide necessary education as pt plans to return home on D/C.  If pt demos activity at baseline function upon f/u visit, will be D/C'd from caseload and referred to mobility team.     Recommendations for follow up therapy are one component of a multi-disciplinary discharge planning process, led by the attending physician.  Recommendations may be updated based on patient status, additional functional criteria and insurance authorization.  Follow Up Recommendations Home health PT (Spouse states they feel comfortable with pt returning home, do not want rehab.  PT unable to assess OOB at this time due to pt having inc pain at this time.)    Assistance Recommended at Discharge Set up Supervision/Assistance  Patient can return home with the following  Assistance with cooking/housework;A little help with walking and/or transfers;A little help with bathing/dressing/bathroom    Equipment Recommendations  (Pt has necessary equipment)  Recommendations for Other Services       Functional Status Assessment Patient has had a recent decline in their functional  status and demonstrates the ability to make significant improvements in function in a reasonable and predictable amount of time.     Precautions / Restrictions Precautions Precautions: None      Mobility  Bed Mobility Overal bed mobility: Independent Bed Mobility: Rolling Rolling: Independent         General bed mobility comments: Demonstrates rolling independently but refuses further activity at this time due to pain. Patient Response: Cooperative  Transfers                        Ambulation/Gait                  Stairs            Wheelchair Mobility    Modified Rankin (Stroke Patients Only)       Balance                                             Pertinent Vitals/Pain Pain Assessment Pain Assessment: 0-10 Pain Score: 10-Worst pain ever Pain Location: Abdomen Pain Descriptors / Indicators: Discomfort, Aching, Throbbing Pain Intervention(s): Limited activity within patient's tolerance, RN gave pain meds during session    [place] expects to be discharged to:: Private residence Living Arrangements: Spouse/significant other Available Help at Discharge: Family;Available 24 hours/day Type of Home: House Home Access: Level entry       Home Layout: One level Home Equipment: Conservation officer, nature (2 wheels)      Prior Function Prior Level of Function :  Independent/Modified Independent             Mobility Comments: Typically mod I with ADLs and functional mobility per spouse       Hand Dominance        Extremity/Trunk Assessment   Upper Extremity Assessment Upper Extremity Assessment: Defer to OT evaluation    Lower Extremity Assessment Lower Extremity Assessment: Overall WFL for tasks assessed       Communication   Communication:  (Spouse assists with translation)  Cognition Arousal/Alertness: Awake/alert Behavior During Therapy: WFL for tasks assessed/performed Overall Cognitive  Status: Within Functional Limits for tasks assessed                                 General Comments: Pt is supine in bed when PT arrives.  Spouse is present in room.  Refuses OOB due to pain, but does state she has been able to amb to bathroom during admission as needed.        General Comments      Exercises     Assessment/Plan    PT Assessment Patient needs continued PT services  PT Problem List Decreased activity tolerance;Pain       PT Treatment Interventions Functional mobility training;Therapeutic activities;Patient/family education;Balance training;Gait training;Therapeutic exercise;DME instruction    PT Goals (Current goals can be found in the Care Plan section)  Acute Rehab PT Goals Patient Stated Goal: To decrease pain PT Goal Formulation: With patient/family Time For Goal Achievement: 05/13/21 Potential to Achieve Goals: Good    Frequency Min 3X/week     Co-evaluation               AM-PAC PT "6 Clicks" Mobility  Outcome Measure Help needed turning from your back to your side while in a flat bed without using bedrails?: None Help needed moving from lying on your back to sitting on the side of a flat bed without using bedrails?: A Little Help needed moving to and from a bed to a chair (including a wheelchair)?: A Little Help needed standing up from a chair using your arms (e.g., wheelchair or bedside chair)?: A Little Help needed to walk in hospital room?: A Little Help needed climbing 3-5 steps with a railing? : A Little 6 Click Score: 19    End of Session   Activity Tolerance: Patient limited by pain Patient left: in bed;with family/visitor present Nurse Communication: Mobility status PT Visit Diagnosis: Pain Pain - part of body:  (Abdomen)    Time: 1157-2620 PT Time Calculation (min) (ACUTE ONLY): 8 min   Charges:   PT Evaluation $PT Eval Low Complexity: 1 Low         Leonie Amacher A. Tavari Loadholt, PT, DPT Acute Rehabilitation  Services Office: [place] 04/29/2021, 10:37 AM

## 2021-04-29 NOTE — Consult Note (Addendum)
Initial Consultation Note   Patient: Leah Pruitt NFA:[id number] DOB: Jul 01, 1945 PCP: Emmaline Kluver, MD DOA: 04/28/2021 DOS: the patient was seen and examined on 04/29/2021 Primary service: Dwan Bolt, MD  Referring physician: Michaelle Birks, MD Reason for consult: Medical management of comorbidities  Assessment/Plan: Assessment and Plan: * Small bowel obstruction secondary to pancreatic cancer- (present on admission) -Discussed with patient the possible need of TPN in the interim if GI is unable to get GJ tube functioning.  Leukocytosis Suspect reactive in nature -Continue to monitor  Pancreatic cancer [facility])- (present on admission) Pathology biopsy from exploratory laparotomy from 03/29/2021 moderate to poorly differentiated adenocarcinoma. -Will need outpatient follow-up with oncology  Type 2 diabetes mellitus without complication ([place]) Last hemoglobin A1c was 6.4 on 02/2021.  Blood sugars have been low normal.  Patient had initially been placed on moderate sliding scale of insulin, but is not on insulin at baseline. She has continued to have poor p.o. intake. -Continue hypoglycemic protocol -Continue CBGs before every meal -Changed from moderate to a sensitive sliding scale insulin with meals and discontinued insulin at night   Gastro-esophageal reflux disease without esophagitis- (present on admission) - Continue Protonix IV  Essential (primary) hypertension- (present on admission) - Continue diltiazem as tolerated  Acute kidney injury- Present on admission Resolving     TRH will continue to follow the patient.  HPI: Leah Pruitt is a 76 y.o. female with past medical history of hypertension, diabetes mellitus type 2, GERD, and pancreatic mass s/p exploratory laparotomy with gastrojejunostomy tube placement on 1/13 by Dr. Zenia Resides for duodenal obstruction presented with complaints of being unable to tolerate oral intake due to nausea, vomiting, abdominal  distention, and pain.  History is obtained with assistance of the patient's husband present at bedside who acts as a interpreter.  Previously she notes she had been on TPN, but had discomfort from the PICC line in her arm and states that it made her blood sugars elevated.  Normally patient is not on anything to treat her blood sugars at home.  Furthermore, she does not want to nasogastric tube placed either.  She would like the [place] tube that was placed to be able to be used.  At this time she has been taking in sips here and there liquids.  She has her oncology appointment set for the ninth of this month.  Her husband notes that her sister just died from uterine cancer.  She had previously had a fentanyl patch in place, but reported that it had caused shortness of breath which was removed.    Review of Systems: As mentioned in the history of present illness. All other systems reviewed and are negative. Past Medical History:  Diagnosis Date   Appendicitis, acute 11/19/2012   Arrhythmia    Arthritis    Back pain    Cardiac arrhythmia    Colles' fracture of right radius, init for clos fx 07/09/2017   Diabetes ([place])    Diverticulosis    FH: cholecystectomy    Gastroesophageal reflux disease    GERD (gastroesophageal reflux disease) 11/19/2012   Headache    HTN (hypertension)    Lipoma    Scalp   Migraines    Osteoporosis    Pain in right wrist 07/09/2017   Unspecified essential hypertension 11/19/2012   Past Surgical History:  Procedure Laterality Date   CESAREAN SECTION     CHOLECYSTECTOMY     GASTROJEJUNOSTOMY N/A 04/08/2021   Procedure: OPEN GASTROJEJUNOSTOMY BYPASS;  Surgeon: Michaelle Birks  L, MD;  Location: [place];  Service: General;  Laterality: N/A;   LAPAROSCOPIC APPENDECTOMY N/A 11/16/2012   Procedure: APPENDECTOMY LAPAROSCOPIC;  Surgeon: Imogene Burn. Georgette Dover, MD;  Location: [place];  Service: General;  Laterality: N/A;   LAPAROSCOPIC LYSIS OF ADHESIONS Bilateral 11/16/2012   Procedure:  LAPAROSCOPIC LYSIS OF ADHESIONS;  Surgeon: Imogene Burn. Georgette Dover, MD;  Location: [place];  Service: General;  Laterality: Bilateral;   TONSILLECTOMY     Social History:  reports that she has never smoked. She has never used smokeless tobacco. She reports that she does not drink alcohol and does not use drugs.  Allergies  Allergen Reactions   Iodinated Contrast Media Swelling    Pt had swelling after getting IV contrast during an IVP years ago   Naratriptan Anaphylaxis    Rash, difficult time breathing and swallowing   Penicillins Other (See Comments)    Numbness of mouth and dry mouth Tolerated Cephalosporin Date: 04/19/21.     Iodine Swelling   Ciprofloxacin     Other reaction(s): Other (See Comments) Tendon pain   Duragesic-100 [Fentanyl] Other (See Comments)    Spouse states pt had trouble breathing and stomach began to swell (patch specific)   Penicillin G Rash    Family History  Problem Relation Age of Onset   Stroke Mother 14       deceased   Hypertension Mother    Diabetes Mellitus I Mother    Lung cancer Father 7       deceased   Hypertension Sister    Uterine cancer Sister    Stomach cancer Brother     Prior to Admission medications   Medication Sig Start Date End Date Taking? Authorizing Provider  BENICAR 20 MG tablet Take 20 mg by mouth 2 (two) times daily. 11/24/20  Yes [provider]  Calcium Carbonate-Vitamin D 600-200 MG-UNIT TABS Take 1 tablet by mouth 2 (two) times daily with a meal.   Yes [provider]  CAMPHOR EX Apply 1 application topically as needed (pain).   Yes [provider]  Carboxymethylcellulose Sodium (DRY EYE RELIEF OP) Apply 1 drop to eye as needed (Dryness).   Yes [provider]  diltiazem (TIAZAC) 120 MG 24 hr capsule Do not take till you talk with your Medical doctor about her blood pressure Patient taking differently: Take 120 mg by mouth daily. 11/17/12  Yes Earnstine Regal, PA-C  docusate sodium  (COLACE) 100 MG capsule Take 1 capsule (100 mg total) by mouth 2 (two) times daily for 14 days. 04/19/21 05/03/21 Yes Dwan Bolt, MD  GLUMETZA 500 MG 24 hr tablet Take 500 mg by mouth 3 (three) times daily. 06/01/20  Yes [provider]  hydrOXYzine (ATARAX) 10 MG tablet Take 10 mg by mouth as needed for anxiety. 04/26/21 05/06/21 Yes [provider]  magnesium hydroxide (MILK OF MAGNESIA) 800 MG/5ML suspension Take 30 mLs by mouth daily.   Yes [provider]  meclizine (ANTIVERT) 25 MG tablet Take 25 mg by mouth daily as needed for dizziness. 08/18/20  Yes [provider]  MOVANTIK 12.5 MG TABS tablet Take 12.5 mg by mouth daily as needed (constipation). 12/31/20  Yes [provider]  nitroGLYCERIN (NITROSTAT) 0.4 MG SL tablet Place 0.4 mg under the tongue every 5 (five) minutes as needed for chest pain.   Yes [provider]  ondansetron (ZOFRAN-ODT) 4 MG disintegrating tablet Take 4 mg by mouth every 8 (eight) hours as needed for nausea or  vomiting.   Yes [provider]  oxyCODONE (OXY IR/ROXICODONE) 5 MG immediate release tablet Take 1-2 tablets (5-10 mg total) by mouth every 4 (four) hours as needed for severe pain (5mg  for moderate pain, 10 mg for severe pain). 04/19/21  Yes Dwan Bolt, MD  RABEprazole (ACIPHEX) 20 MG tablet Take 20 mg by mouth 2 (two) times daily.   Yes [provider]  sucralfate (CARAFATE) 1 GM/10ML suspension Take 1 g by mouth 2 (two) times daily.   Yes [provider]  acetaminophen (TYLENOL) 325 MG tablet Take 2 tablets (650 mg total) by mouth every 6 (six) hours as needed for headache or mild pain. Patient not taking: Reported on 04/28/2021 04/19/21   Dwan Bolt, MD  pantoprazole (PROTONIX) 40 MG tablet Take 1 tablet (40 mg total) by mouth 2 (two) times daily. Patient not taking: Reported on 04/28/2021 03/02/21   Daryel November, MD  prochlorperazine (COMPAZINE) 10 MG tablet Take 1  tablet (10 mg total) by mouth every 6 (six) hours as needed for nausea or vomiting. Patient not taking: Reported on 04/08/2021 04/06/21   Rosanne Sack A, PA-C  traMADol (ULTRAM) 50 MG tablet Take 50-100 mg by mouth 3 (three) times daily as needed. Patient not taking: Reported on 04/28/2021 04/21/21   [provider]    Physical Exam: Vitals:   04/28/21 2235 04/28/21 2330 04/29/21 0341 04/29/21 0808  BP: (!) 87/58 97/62 95/61  133/75  Pulse: (!) 108 (!) 113 93 (!) 101  Resp: 16 16 16 16   Temp: 98.6 F (37 C) 98.6 F (37 C) 98.3 F (36.8 C) 98.2 F (36.8 C)  TempSrc: Oral Oral Oral Oral  SpO2: 99% 97% 99% 100%  Weight:      Height:       Exam  Constitutional: Elderly female sitting up on side of the bed Eyes: PERRL, lids and conjunctivae normal ENMT: Mucous membranes are moist. Posterior pharynx clear of any exudate or lesions.  Neck: normal, supple, no masses, no thyromegaly Respiratory: clear to auscultation bilaterally, no wheezing, no crackles. Normal respiratory effort. No accessory muscle use.  Cardiovascular: Regular rate and rhythm, no murmurs / rubs / gallops.  Trace lower extremity Abdomen: Left upper quadrant abdominal pain to palpation.  GJ tube in place. Musculoskeletal: no clubbing / cyanosis. No joint deformity upper and lower extremities.   Skin: no rashes, lesions, ulcers. No induration Neurologic: CN 2-12 grossly intact.  Able to move all extremities psychiatric: Normal judgment and insight. Alert and oriented x 3.  Anxious mood   Data Reviewed: Labs noted leukocytosis 11, hemoglobin 9.7, platelets 504, sodium 130, BUN 20, creatinine 1.26  Family Communication: Discussed plan of care with the patient's husband present at bedside Primary team communication:  Thank you very much for involving [place] in the care of your patient.  Author: Norval Morton, MD 04/29/2021 8:36 AM  For on call review [email or web address].

## 2021-04-29 NOTE — Assessment & Plan Note (Signed)
Pathology biopsy from exploratory laparotomy from 03/29/2021 moderate to poorly differentiated adenocarcinoma. -Will need outpatient follow-up with oncology

## 2021-04-29 NOTE — Progress Notes (Incomplete)
[place]  [address] [place],  [place]  [address] [phone]  Clinic Day:  04/29/2021  Referring physician: Venetia Maxon, [name], *  This document serves as a record of services personally performed by Hosie Poisson, MD. It was created on their behalf by Curry,Lauren E, a trained medical scribe. The creation of this record is based on the scribe's personal observations and the provider's statements to them.  CHIEF COMPLAINT:  CC: Pancreatic mass   Current Treatment:  Diagnostics   HISTORY OF PRESENT ILLNESS:  Leah Pruitt is a 76 y.o. female with a history of abdominal pain who is referred in consultation with [employer] for assessment and management. She has ongoing abdominal pain for several years, often treated with cipro for diverticulitis. The pain became more severe with nausea and vomiting over the last couple of months. CT imaging of the abdomen/ pelvis 03/15/21 reveals solid-appearing 3.9 cm mass appears to arise from the inferior pancreatic body, concerning for pancreatic neoplasm. MRI performed on 03/16/2021 revealed an ill-defined hypovascular mass involving the pancreatic tail, distal duodenum causing duodenal obstruction, and left UPJ causing moderate left hydronephrosis. Differential includes primary pancreatic carcinoma, and metastatic disease. Patient was scheduled for biopsy via EUS on 03/29/2021; however, required hospitalization regarding increased nausea, vomiting and pain. During this hospital stay, the patient's symptoms were managed with insertion of NG tube. This was quite uncomfortable for the patient and led to the patient's frustration with being hospitalized without biopsy. She ultimately left the hospital AMA and canceled her EUS appointment on the 3rd. Of note, she also had a positive Cologuard test result and was advised to undergo colonoscopy.   Today she presents to clinic with continued abdominal and back  pain. She continues to have nausea and vomiting and is only able to keep small amounts of food down. She has chronic constipation, but this seems worse over the last week. She denies any burning or pain with urination, although admits to frequent UTIs in the past. She denies fever, chills, shortness of breath or cough. She has increased fatigue and stress regarding her new situation. Her husband recently lost his job and they are both fearful of financial stressors. We did discuss the imaging results and the need to obtain tissue biopsy for diagnosis. She is agreeable to this plan; however, she does not want to return to [facility] for biopsy. She is requesting [place] referral for this procedure.   REVIEW OF SYSTEMS:  Review of Systems  Constitutional:  Positive for fatigue. Negative for appetite change, chills, diaphoresis, fever and unexpected weight change.  HENT:   Negative for hearing loss, lump/mass, mouth sores, nosebleeds, sore throat, tinnitus, trouble swallowing and voice change.   Eyes:  Negative for eye problems and icterus.  Respiratory:  Negative for chest tightness, cough, hemoptysis, shortness of breath and wheezing.   Cardiovascular:  Negative for chest pain, leg swelling and palpitations.  Gastrointestinal:  Positive for abdominal pain, blood in stool, constipation, nausea and vomiting. Negative for abdominal distention, diarrhea and rectal pain.  Endocrine: Negative for hot flashes.  Genitourinary:  Negative for bladder incontinence, difficulty urinating, dyspareunia, dysuria, frequency, hematuria and nocturia.   Musculoskeletal:  Positive for back pain. Negative for arthralgias, flank pain, gait problem, myalgias, neck pain and neck stiffness.  Skin:  Negative for itching, rash and wound.  Neurological:  Negative for dizziness, extremity weakness, gait problem, headaches, light-headedness, numbness, seizures and speech difficulty.  Hematological:  Negative for adenopathy.  Does  not bruise/bleed easily.  Psychiatric/Behavioral:  Negative for confusion, decreased concentration, depression, sleep disturbance and suicidal ideas. The patient is nervous/anxious.     VITALS:  There were no vitals taken for this visit.  Wt Readings from Last 3 Encounters:  04/28/21 112 lb (50.8 kg)  04/19/21 117 lb 11.6 oz (53.4 kg)  03/31/21 114 lb 6.4 oz (51.9 kg)    There is no height or weight on file to calculate BMI.  Performance status (ECOG): 1 - Symptomatic but completely ambulatory  PHYSICAL EXAM:  Physical Exam Constitutional:      General: She is not in acute distress.    Appearance: Normal appearance. She is normal weight. She is not ill-appearing, toxic-appearing or diaphoretic.  HENT:     Head: Normocephalic and atraumatic.     Right Ear: Tympanic membrane normal.     Left Ear: Tympanic membrane normal.     Nose: Nose normal. No congestion or rhinorrhea.     Mouth/Throat:     Mouth: Mucous membranes are moist.     Pharynx: Oropharynx is clear. No oropharyngeal exudate or posterior oropharyngeal erythema.  Eyes:     General: No scleral icterus.       Right eye: No discharge.        Left eye: No discharge.     Extraocular Movements: Extraocular movements intact.     Conjunctiva/sclera: Conjunctivae normal.     Pupils: Pupils are equal, round, and reactive to light.  Neck:     Vascular: No carotid bruit.  Cardiovascular:     Rate and Rhythm: Normal rate and regular rhythm.     Heart sounds: No murmur heard.   No friction rub. No gallop.  Pulmonary:     Effort: Pulmonary effort is normal. No respiratory distress.     Breath sounds: Normal breath sounds. No stridor. No wheezing, rhonchi or rales.  Chest:     Chest wall: No tenderness.  Abdominal:     General: Abdomen is flat. Bowel sounds are normal. There is no distension.     Palpations: There is no mass.     Tenderness: There is no abdominal tenderness. There is no right CVA tenderness, left CVA  tenderness, guarding or rebound.     Hernia: No hernia is present.  Musculoskeletal:        General: No swelling, tenderness, deformity or signs of injury. Normal range of motion.     Cervical back: Normal range of motion and neck supple. No rigidity or tenderness.     Right lower leg: No edema.     Left lower leg: No edema.  Lymphadenopathy:     Cervical: No cervical adenopathy.  Skin:    General: Skin is warm and dry.     Capillary Refill: Capillary refill takes less than 2 seconds.     Coloration: Skin is not jaundiced or pale.     Findings: No bruising, erythema, lesion or rash.  Neurological:     General: No focal deficit present.     Mental Status: She is alert and oriented to person, place, and time. Mental status is at baseline.     Cranial Nerves: No cranial nerve deficit.     Sensory: No sensory deficit.     Motor: No weakness.     Coordination: Coordination normal.     Gait: Gait normal.     Deep Tendon Reflexes: Reflexes normal.  Psychiatric:        Mood and Affect: Mood normal.  Behavior: Behavior normal.        Thought Content: Thought content normal.        Judgment: Judgment normal.     LABS:   CBC Latest Ref Rng & Units 04/29/2021 04/28/2021 04/18/2021  WBC 4.0 - 10.5 K/uL 11.0(H) 12.4(H) 8.1  Hemoglobin 12.0 - 15.0 g/dL 9.7(L) 11.4(L) 7.7(L)  Hematocrit 36.0 - 46.0 % 30.2(L) 35.1(L) 23.3(L)  Platelets 150 - 400 K/uL 504(H) 657(H) 324   CMP Latest Ref Rng & Units 04/29/2021 04/28/2021 04/18/2021  Glucose 70 - 99 mg/dL 123(H) 176(H) 132(H)  BUN 8 - 23 mg/dL 20 20 19   Creatinine 0.44 - 1.00 mg/dL 1.26(H) 1.45(H) 0.89  Sodium 135 - 145 mmol/L 130(L) 130(L) 137  Potassium 3.5 - 5.1 mmol/L 4.6 4.6 4.3  Chloride 98 - 111 mmol/L 99 92(L) 103  CO2 22 - 32 mmol/L 22 25 27   Calcium 8.9 - 10.3 mg/dL 8.9 10.0 8.7(L)  Total Protein 6.5 - 8.1 g/dL - 7.6 5.0(L)  Total Bilirubin 0.3 - 1.2 mg/dL - 0.5 <0.1(L)  Alkaline Phos 38 - 126 U/L - 104 80  AST 15 - 41 U/L - 18 16   ALT 0 - 44 U/L - 10 8     Lab Results  Component Value Date   CEA1 13.5 (H) 03/24/2021   /  CEA  Date Value Ref Range Status  03/24/2021 13.5 (H) 0.0 - 4.7 ng/mL Final    Comment:    (NOTE)                             Nonsmokers          <3.9                             Smokers             <5.6 Roche [employer] Electrochemiluminescence Immunoassay (ECLIA) Values obtained with different assay methods or kits cannot be used interchangeably.  Results cannot be interpreted as absolute evidence of the presence or absence of malignant disease. Performed At: [facility], [place] [id number] Rush Farmer MD JX:[id number]    No results found for: PSA1 Lab Results  Component Value Date   ZHY865 784 (H) 03/24/2021   No results found for: ONG295  No results found for: TOTALPROTELP, ALBUMINELP, A1GS, A2GS, BETS, BETA2SER, GAMS, MSPIKE, SPEI Lab Results  Component Value Date   FERRITIN 34 08/22/2020   No results found for: LDH  STUDIES:  CT ABDOMEN PELVIS WO CONTRAST  Result Date: 04/28/2021 CLINICAL DATA:  Postop, abdominal pain, concern for sepsis EXAM: CT ABDOMEN AND PELVIS WITHOUT CONTRAST TECHNIQUE: Multidetector CT imaging of the abdomen and pelvis was performed following the standard protocol without IV contrast. RADIATION DOSE REDUCTION: This exam was performed according to the departmental dose-optimization program which includes automated exposure control, adjustment of the mA and/or kV according to patient size and/or use of iterative reconstruction technique. COMPARISON:  04/28/2021, 04/14/2021 FINDINGS: Lower chest: No acute pleural or parenchymal lung disease. Hepatobiliary: Gallbladder is surgically absent. Unremarkable unenhanced appearance of the liver. Pancreas: Stable pancreatic atrophy. Ill-defined 3.8 x 3.4 cm soft tissue mass centered between the pancreatic tail and distal duodenum again noted, not appreciably changed. Spleen:  Unremarkable unenhanced appearance. Adrenals/Urinary Tract: Stable severe left-sided hydronephrosis and left renal atrophy. This is likely due to infiltration or extrinsic mass effect upon the proximal left  ureter by the ill-defined mass described above. Right kidney is stable. Bladder is decompressed, limiting its evaluation. The adrenals are unremarkable. Stomach/Bowel: A percutaneous gastrojejunostomy tube is identified, distal tip extending through the gastrojejunostomy and residing within the proximal jejunal limb. There is moderate distention of the stomach, proximal duodenum, and efferent jejunal limb distal to the gastrojejunostomy. The distended jejunal limb extends into the lower pelvis, measuring up to 3.3 cm in diameter. Gas fluid levels are identified. The distal jejunum and ileum are decompressed. Minimal retained stool within the colon. Vascular/Lymphatic: Aortic atherosclerosis. No enlarged abdominal or pelvic lymph nodes. Reproductive: Uterus and bilateral adnexa are unremarkable. Other: No free fluid or free intraperitoneal gas. No abdominal wall hernia. Musculoskeletal: No acute or destructive bony lesions. Reconstructed images demonstrate no additional findings. IMPRESSION: 1. Postsurgical changes from gastrojejunostomy, with percutaneous gastrojejunostomy tube extending through the gastrojejunostomy site and residing within the proximal aspect of the efferent jejunal limb. 2. Moderate distension of the stomach, proximal duodenum, and efferent jejunal limb as above, to the level of the mid lower pelvis. Findings are concerning for developing small bowel obstruction involving the efferent jejunal limb, versus postoperative ileus. 3. Stable ill-defined mass centered between the pancreatic tail and distal duodenum, with mass effect upon the duodenum and obstruction of the proximal left ureter as described above. 4. Stable severe left hydronephrosis and left renal atrophy. 5.  Aortic Atherosclerosis  (ICD10-I70.0). Electronically Signed   By: Randa Ngo M.D.   On: 04/28/2021 15:45   CT ABDOMEN PELVIS WO CONTRAST  Result Date: 04/14/2021 CLINICAL DATA:  Postoperative. Recent abdominal surgery. Abdominal pain. Evaluate for fluid collection and confirm appropriate position of J-tube. EXAM: CT ABDOMEN AND PELVIS WITHOUT CONTRAST TECHNIQUE: Multidetector CT imaging of the abdomen and pelvis was performed following the standard protocol without IV contrast. RADIATION DOSE REDUCTION: This exam was performed according to the departmental dose-optimization program which includes automated exposure control, adjustment of the mA and/or kV according to patient size and/or use of iterative reconstruction technique. COMPARISON:  KUB 04/13/2021 CT abdomen and pelvis 03/22/2021 FINDINGS: Lower chest: There is new curvilinear likely subsegmental atelectasis within the bilateral lower lobes. Heart size is mildly enlarged. Coronary artery and aortic root calcifications are again seen. No pericardial effusion. Lack of intra-articular fluid limits evaluation of the abdominal and pelvic organ parenchyma. The following findings are made within this limitation. Hepatobiliary: Smooth liver contours. No gross liver lesion is seen. Status post cholecystectomy. Pancreas: There is again a soft tissue mass in the region of the inferior aspect of the junction of the body and tail of the pancreas (axial image 35, coronal image 60), measuring up to 4.8 x 4.1 x 3.7 cm (transverse by AP by craniocaudal), unchanged from 03/22/2021 when measured in a similar manner. Previously this was associated with mass effect on the distal duodenum and proximal duodenal and stomach distention which appears resolved following reported interval gastrojejunostomy. Spleen: Normal in size without focal abnormality. Adrenals/Urinary Tract: Normal adrenals. There is again moderate to high-grade left cortical atrophy. Unchanged severe left hydronephrosis and  mild right hydronephrosis. Within the limitations of lack of IV contrast, no contour deforming renal mass. The urinary bladder is grossly unremarkable. Stomach/Bowel: A catheter from ventral abdominal approach is seen with balloon within the stomach and the catheter tubing that appears to be curl within the stomach, extending from the gastric body through the duodenal bulb and with curling backwards into the stomach body before terminating likely through an anastomosis of the stomach and proximal  jejunum (coronal image 36). There are small foci of air seen just distal to the tip, likely within the proximal jejunum (coronal image 36 and axial images 45 and 46). There are scattered foci of air soft tissue swelling, and fat stranding within the left ventral abdominal wall subcutaneous fat adjacent to the course of the insertion of the gastrojejunostomy tube. Mild-to-moderate distal colonic diverticulosis. Oral contrast is seen as distal as the transverse colon. No dilated loops of bowel to indicate bowel obstruction. Vascular/Lymphatic: No abdominal aortic aneurysm. No enlarged abdominal or pelvic lymph nodes. Reproductive: The uterus is present and grossly unremarkable. No adnexal masses. Other: Small 1.5 cm focus of air and fluid just to the right of the umbilicus within the ventral subcutaneous fat. This may represent a prior laparoscopic port. No free air or free fluid. Musculoskeletal: Grade 1 anterolisthesis of L5 on S1 with severe L5-S1 facet joint degenerative changes. IMPRESSION: 1. Redemonstration of soft tissue mass at the inferior aspect of the junction of the body and posterior horn of the pancreas suspicious for malignancy. 2. Interval gastrojejunostomy and interval placement of a gastrojejunostomy tube with the tip terminating within the jejunum just distal to the gastrojejunostomy anastomosis. Resolution of the prior stomach and proximal duodenal dilatation from obstruction by the pancreatic mass. 3.  Unchanged severe left and mild right hydronephrosis. Electronically Signed   By: Yvonne Kendall M.D.   On: 04/14/2021 12:24   DG ABDOMEN PEG TUBE LOCATION  Result Date: 04/13/2021 CLINICAL DATA:  Gastrostomy tube assessment EXAM: ABDOMEN - 1 VIEW COMPARISON:  03/22/2021 CT abdomen FINDINGS: The gastrostomy tube was reportedly injected with 30 cc of Gastrografin. The radiograph shows contrast in the tube and in the stomach, without unexpected extension around the tube. A retention balloon in the stomach forms a filling defect. There is some additional tubing projecting over the stomach. A small amount of contrast along the inferior margin of the greater curvature of the stomach could reflect early contrast extension into jejunum via the gastrojejunostomy. IMPRESSION: 1. Contrast injected through the gastric port fills the stomach. Questionable early transit contrast from the stomach into the jejunum via the gastrojejunostomy. 2. There is also some coiled tubing over the stomach well beyond the retention balloon, some of this may in part reflect the reported gastro jejunal extension. Electronically Signed   By: Van Clines M.D.   On: 04/13/2021 11:40   DG CHEST PORT 1 VIEW  Result Date: 04/11/2021 CLINICAL DATA:  Encounter for fever. EXAM: PORTABLE CHEST 1 VIEW COMPARISON:  Portable chest 03/24/2008. FINDINGS: The lungs are expiratory. Small left pleural effusion is present and overlying coarse linear markings which could be atelectasis or atelectasis intermixed with pneumonic infiltrate. There are atelectatic bands in the right perihilar area and in the hypoinflated right base. The remainder of the lungs appear clear. There is mild cardiomegaly, normal central vasculature, stable mediastinum with aortic tortuosity arch calcifications. Right PICC terminates in the upper right atrium. Osteopenia and thoracic spondylosis. IMPRESSION: 1. Limited expiratory study. There are asymmetric coarse markings in the  left base and small left effusion. Findings could be due to atelectasis or a combination of atelectasis and pneumonia. PA and lateral study in full inspiration recommended. 2. Right PICC tip in the upper right atrium. 3. Aortic atherosclerosis. Electronically Signed   By: Telford Nab M.D.   On: 04/11/2021 03:07   DG Abdomen Acute W/Chest  Result Date: 04/28/2021 CLINICAL DATA:  Postoperative, abdominal pain, concern for sepsis EXAM: DG ABDOMEN ACUTE WITH  1 VIEW CHEST COMPARISON:  04/13/2021 FINDINGS: Percutaneous gastrojejunostomy tube. Mildly distended air and fluid-filled loops of small bowel in the low central abdomen, measuring up to 4.0 cm in caliber. Scattered gas and stool present to the descending colon. No free air. No radiopaque calculi or other significant radiographic abnormality is seen. Heart size and mediastinal contours are within normal limits. Both lungs are clear. IMPRESSION: 1. Mildly distended air and fluid-filled loops of small bowel in the low central abdomen, measuring up to 4.0 cm in caliber. Scattered gas and stool present to the descending colon. Findings suggest postoperative ileus or developing small bowel obstruction. No free air. 2.  No acute abnormality of the lungs. Electronically Signed   By: Delanna Ahmadi M.D.   On: 04/28/2021 13:56   [place] EKG SITE RITE  Result Date: 04/07/2021 If Site Rite image not attached, placement could not be confirmed due to current cardiac rhythm.     HISTORY:   Past Medical History:  Diagnosis Date   Appendicitis, acute 11/19/2012   Arrhythmia    Arthritis    Back pain    Cardiac arrhythmia    Colles' fracture of right radius, init for clos fx 07/09/2017   Diabetes ([place])    Diverticulosis    FH: cholecystectomy    Gastroesophageal reflux disease    GERD (gastroesophageal reflux disease) 11/19/2012   Headache    HTN (hypertension)    Lipoma    Scalp   Migraines    Osteoporosis    Pain in right wrist 07/09/2017   Unspecified  essential hypertension 11/19/2012    Past Surgical History:  Procedure Laterality Date   CESAREAN SECTION     CHOLECYSTECTOMY     GASTROJEJUNOSTOMY N/A 04/08/2021   Procedure: OPEN GASTROJEJUNOSTOMY BYPASS;  Surgeon: Dwan Bolt, MD;  Location: [facility];  Service: General;  Laterality: N/A;   LAPAROSCOPIC APPENDECTOMY N/A 11/16/2012   Procedure: APPENDECTOMY LAPAROSCOPIC;  Surgeon: Imogene Burn. Georgette Dover, MD;  Location: [facility];  Service: General;  Laterality: N/A;   LAPAROSCOPIC LYSIS OF ADHESIONS Bilateral 11/16/2012   Procedure: LAPAROSCOPIC LYSIS OF ADHESIONS;  Surgeon: Imogene Burn. Georgette Dover, MD;  Location: [facility];  Service: General;  Laterality: Bilateral;   TONSILLECTOMY      Family History  Problem Relation Age of Onset   Stroke Mother 29       deceased   Hypertension Mother    Diabetes Mellitus I Mother    Lung cancer Father 82       deceased   Hypertension Sister    Uterine cancer Sister    Stomach cancer Brother     Social History:  reports that she has never smoked. She has never used smokeless tobacco. She reports that she does not drink alcohol and does not use drugs.The patient is accompanied by her husband today.  Allergies:  Allergies  Allergen Reactions   Iodinated Contrast Media Swelling    Pt had swelling after getting IV contrast during an IVP years ago   Naratriptan Anaphylaxis    Rash, difficult time breathing and swallowing   Penicillins Other (See Comments)    Numbness of mouth and dry mouth Tolerated Cephalosporin Date: 04/19/21.     Iodine Swelling   Ciprofloxacin     Other reaction(s): Other (See Comments) Tendon pain   Duragesic-100 [Fentanyl] Other (See Comments)    Spouse states pt had trouble breathing and stomach began to swell (patch specific)   Penicillin G Rash    Current Medications: No current  facility-administered medications for this visit.   No current outpatient medications on file.   Facility-Administered Medications Ordered in Other  Visits  Medication Dose Route Frequency Provider Last Rate Last Admin   0.9 %  sodium chloride infusion   Intravenous Continuous Meuth, Brooke A, PA-C 100 mL/hr at 04/29/21 1400 New Bag at 04/29/21 1400   acetaminophen (TYLENOL) tablet 650 mg  650 mg Oral Q6H PRN Meuth, Brooke A, PA-C       Or   acetaminophen (TYLENOL) suppository 650 mg  650 mg Rectal Q6H PRN Meuth, Brooke A, PA-C       bisacodyl (DULCOLAX) suppository 10 mg  10 mg Rectal Daily PRN Meuth, Brooke A, PA-C       diphenhydrAMINE (BENADRYL) 12.5 MG/5ML elixir 12.5 mg  12.5 mg Oral Q6H PRN Meuth, Brooke A, PA-C       Or   diphenhydrAMINE (BENADRYL) injection 12.5 mg  12.5 mg Intravenous Q6H PRN Meuth, Brooke A, PA-C       docusate sodium (COLACE) capsule 100 mg  100 mg Oral BID Meuth, Brooke A, PA-C   100 mg at 04/28/21 2131   enoxaparin (LOVENOX) injection 30 mg  30 mg Subcutaneous Q24H Donnamae Jude, RPH   30 mg at 04/28/21 2131   HYDROmorphone (DILAUDID) injection 0.5-1 mg  0.5-1 mg Intravenous Q3H PRN Meuth, Brooke A, PA-C   1 mg at 04/29/21 0859   insulin aspart (novoLOG) injection 0-9 Units  0-9 Units Subcutaneous TID WC Smith, Rondell A, MD       magnesium hydroxide (MILK OF MAGNESIA) suspension 15 mL  15 mL Oral Daily PRN Meuth, Brooke A, PA-C       meclizine (ANTIVERT) tablet 25 mg  25 mg Oral Daily PRN Meuth, Brooke A, PA-C       metoprolol tartrate (LOPRESSOR) injection 5 mg  5 mg Intravenous Q6H PRN Meuth, Brooke A, PA-C       ondansetron (ZOFRAN-ODT) disintegrating tablet 4 mg  4 mg Oral Q6H PRN Meuth, Brooke A, PA-C       Or   ondansetron (ZOFRAN) injection 4 mg  4 mg Intravenous Q6H PRN Meuth, Brooke A, PA-C       oxyCODONE (Oxy IR/ROXICODONE) immediate release tablet 5-10 mg  5-10 mg Oral Q4H PRN Meuth, Brooke A, PA-C   10 mg at 04/29/21 1023   pantoprazole (PROTONIX) injection 40 mg  40 mg Intravenous Q12H Michaelle Birks L, MD   40 mg at 04/29/21 1011   prochlorperazine (COMPAZINE) tablet 10 mg  10 mg Oral Q6H PRN  Meuth, Brooke A, PA-C       Or   prochlorperazine (COMPAZINE) injection 5-10 mg  5-10 mg Intravenous Q6H PRN Meuth, Brooke A, PA-C   5 mg at 04/28/21 1825   simethicone (MYLICON) chewable tablet 40 mg  40 mg Oral Q6H PRN Meuth, Brooke A, PA-C         ASSESSMENT & PLAN:   Assessment:  Tobin Cadiente is a 76 y.o. female with known pancreatic mass here for continued evaluation. She was scheduled for biopsy, but was not happy with the care she received while hospitalized and canceled that appointment. She is understanding of the seriousness of her situation and is agreeable to tissue sampling.   Plan: 1.  We will send referral to [facility] to schedule patient for pancreatic biopsy. She will return to clinic once pathology is obtained to discuss further treatment options.   I discussed the assessment and treatment  plan with the patient.  The patient was provided an opportunity to ask questions and all were answered.  The patient agreed with the plan and demonstrated an understanding of the instructions.  The patient was advised to call back if the symptoms worsen or if the condition fails to improve as anticipated.  Thank you for the opportunity       Lauren E Georgann Housekeeper

## 2021-04-29 NOTE — TOC Initial Note (Signed)
Transition of Care [facility]) - Initial/Assessment Note    Patient Details  Name: Leah Pruitt MRN: [id number] Date of Birth: [birth year]  Transition of Care [facility]) CM/SW Contact:    Marilu Favre, RN Phone Number: 04/29/2021, 11:20 AM  Clinical Narrative:                  Spoke to patient and husband at bedside. Husband interpreted for patient.   Patient from home. Prior to admission active with [facility] for home health PT/RN and would like to continue services with [facility]. Confirmed with Tommi Rumps with [facility]. [facility] will need new orders and face to face to resume care. Entered for MD signature .   Patient has walker and 3 in 1 at home already. Expected Discharge Plan: [place] Barriers to Discharge: Continued Medical Work up   Patient Goals and CMS Choice Patient states their goals for this hospitalization and ongoing recovery are:: to return to home CMS Medicare.gov Compare Post Acute Care list provided to:: Patient Choice offered to / list presented to : Patient, Spouse  Expected Discharge Plan and Services Expected Discharge Plan: [place]   Discharge Planning Services: CM Consult Post Acute Care Choice: [place] arrangements for the past 2 months: Single Family Home                 DME Arranged: N/A DME Agency: NA       HH Arranged: PT, RN [place] Agency: [place] Date [facility] [place] [facility] Agency Contacted: 04/29/21 Time HH Agency Contacted: 1119 Representative spoke with at [place]: Tommi Rumps  Prior Living Arrangements/Services Living arrangements for the past 2 months: [place] Lives with:: Spouse Patient language and need for interpreter reviewed:: Yes Do you feel safe going back to the place where you live?: Yes      Need for Family Participation in Patient Care: Yes (Comment) Care giver support system in place?: Yes (comment) Current home services: DME Criminal Activity/Legal Involvement Pertinent to Current  Situation/Hospitalization: No - Comment as needed  Activities of Daily Living      Permission Sought/Granted   Permission granted to share information with : Yes, Verbal Permission Granted  Share Information with NAME: Leanora Ivanoff husband  Permission granted to share info w AGENCY: [facility]        Emotional Assessment Appearance:: Appears stated age Attitude/Demeanor/Rapport: Engaged Affect (typically observed): Accepting Orientation: : Oriented to Self, Oriented to Place, Oriented to  Time, Oriented to Situation Alcohol / Substance Use: Not Applicable Psych Involvement: No (comment)  Admission diagnosis:  Pancreatic cancer [facility]) [C25.9] Patient Active Problem List   Diagnosis Date Noted   Pancreatic cancer ([place]) 04/28/2021   Protein-calorie malnutrition, severe 04/08/2021   Duodenal obstruction 04/06/2021   Small bowel obstruction secondary to pancreatic cancer 03/22/2021   Pancreatic mass 03/22/2021   Type 2 diabetes mellitus without complication ([place]) 84/16/6063   Pain in left knee 01/18/2018   Osteoarthritis of knee 10/09/2017   Cyst, dermoid, scalp and neck 07/01/2015   Mural thickening of colon    Intractable migraine with aura without status migrainosus 05/18/2015   Osteoporosis 05/18/2015   Atypical chest pain 01/02/2013   Essential (primary) hypertension 11/19/2012   Gastro-esophageal reflux disease without esophagitis 11/19/2012   PCP:  Street, [name], MD Pharmacy:   [facility] [place], [place] - [address] AT [place] [place] [address] Phone: [phone] Fax:  [phone]     Social Determinants of Health (SDOH) Interventions    Readmission Risk Interventions Readmission Risk Prevention Plan 04/19/2021  Transportation Screening Complete  PCP or Specialist Appt within 3-5 Days Complete  HRI or [place] Complete  Social Work Consult for [place] Planning/Counseling  Complete  Palliative Care Screening Not Applicable  Medication Review Press photographer) Complete  Some recent data might be hidden

## 2021-04-29 NOTE — Assessment & Plan Note (Signed)
-   Continue diltiazem as tolerated

## 2021-04-29 NOTE — Assessment & Plan Note (Signed)
-   Continue Protonix IV

## 2021-04-29 NOTE — Progress Notes (Signed)
Subjective: CT yesterday shows gastric distension, small bowel is mostly decompressed. Patient reports severe abdominal and back pain. Creatinine improving.   Objective: Vital signs in last 24 hours: Temp:  [97.9 F (36.6 C)-98.9 F (37.2 C)] 98.2 F (36.8 C) (02/03 0808) Pulse Rate:  [93-119] 101 (02/03 0808) Resp:  [16-25] 16 (02/03 0743) BP: (87-133)/(58-89) 133/75 (02/03 0808) SpO2:  [97 %-100 %] 100 % (02/03 0808) Weight:  [50.8 kg] 50.8 kg (02/02 1255)    Intake/Output from previous day: 02/02 0701 - 02/03 0700 In: 1000 [IV Piggyback:1000] Out: -  Intake/Output this shift: No intake/output data recorded.  PE: General: resting comfortably, NAD Neuro: alert and oriented, no focal deficits Resp: normal work of breathing on room air Abdomen: soft, nondistended, nontender. Midline incision healing well. GJ tube in place, capped. Extremities: warm and well-perfused   Lab Results:  Recent Labs    04/28/21 1305 04/29/21 0126  WBC 12.4* 11.0*  HGB 11.4* 9.7*  HCT 35.1* 30.2*  PLT 657* 504*   BMET Recent Labs    04/28/21 1305 04/29/21 0126  NA 130* 130*  K 4.6 4.6  CL 92* 99  CO2 25 22  GLUCOSE 176* 123*  BUN 20 20  CREATININE 1.45* 1.26*  CALCIUM 10.0 8.9   PT/INR Recent Labs    04/28/21 1330  LABPROT 14.1  INR 1.1   CMP     Component Value Date/Time   NA 130 (L) 04/29/2021 0126   NA 133 (A) 03/31/2021 0000   K 4.6 04/29/2021 0126   CL 99 04/29/2021 0126   CO2 22 04/29/2021 0126   GLUCOSE 123 (H) 04/29/2021 0126   BUN 20 04/29/2021 0126   BUN 17 03/31/2021 0000   CREATININE 1.26 (H) 04/29/2021 0126   CALCIUM 8.9 04/29/2021 0126   PROT 7.6 04/28/2021 1305   ALBUMIN 4.1 04/28/2021 1305   AST 18 04/28/2021 1305   ALT 10 04/28/2021 1305   ALKPHOS 104 04/28/2021 1305   BILITOT 0.5 04/28/2021 1305   GFRNONAA 45 (L) 04/29/2021 0126   GFRAA >60 10/11/2016 1100   Lipase     Component Value Date/Time   LIPASE 21 04/28/2021 1305        Studies/Results: CT ABDOMEN PELVIS WO CONTRAST  Result Date: 04/28/2021 CLINICAL DATA:  Postop, abdominal pain, concern for sepsis EXAM: CT ABDOMEN AND PELVIS WITHOUT CONTRAST TECHNIQUE: Multidetector CT imaging of the abdomen and pelvis was performed following the standard protocol without IV contrast. RADIATION DOSE REDUCTION: This exam was performed according to the departmental dose-optimization program which includes automated exposure control, adjustment of the mA and/or kV according to patient size and/or use of iterative reconstruction technique. COMPARISON:  04/28/2021, 04/14/2021 FINDINGS: Lower chest: No acute pleural or parenchymal lung disease. Hepatobiliary: Gallbladder is surgically absent. Unremarkable unenhanced appearance of the liver. Pancreas: Stable pancreatic atrophy. Ill-defined 3.8 x 3.4 cm soft tissue mass centered between the pancreatic tail and distal duodenum again noted, not appreciably changed. Spleen: Unremarkable unenhanced appearance. Adrenals/Urinary Tract: Stable severe left-sided hydronephrosis and left renal atrophy. This is likely due to infiltration or extrinsic mass effect upon the proximal left ureter by the ill-defined mass described above. Right kidney is stable. Bladder is decompressed, limiting its evaluation. The adrenals are unremarkable. Stomach/Bowel: A percutaneous gastrojejunostomy tube is identified, distal tip extending through the gastrojejunostomy and residing within the proximal jejunal limb. There is moderate distention of the stomach, proximal duodenum, and efferent jejunal limb distal to the gastrojejunostomy. The distended  jejunal limb extends into the lower pelvis, measuring up to 3.3 cm in diameter. Gas fluid levels are identified. The distal jejunum and ileum are decompressed. Minimal retained stool within the colon. Vascular/Lymphatic: Aortic atherosclerosis. No enlarged abdominal or pelvic lymph nodes. Reproductive: Uterus and bilateral  adnexa are unremarkable. Other: No free fluid or free intraperitoneal gas. No abdominal wall hernia. Musculoskeletal: No acute or destructive bony lesions. Reconstructed images demonstrate no additional findings. IMPRESSION: 1. Postsurgical changes from gastrojejunostomy, with percutaneous gastrojejunostomy tube extending through the gastrojejunostomy site and residing within the proximal aspect of the efferent jejunal limb. 2. Moderate distension of the stomach, proximal duodenum, and efferent jejunal limb as above, to the level of the mid lower pelvis. Findings are concerning for developing small bowel obstruction involving the efferent jejunal limb, versus postoperative ileus. 3. Stable ill-defined mass centered between the pancreatic tail and distal duodenum, with mass effect upon the duodenum and obstruction of the proximal left ureter as described above. 4. Stable severe left hydronephrosis and left renal atrophy. 5.  Aortic Atherosclerosis (ICD10-I70.0). Electronically Signed   By: Randa Ngo M.D.   On: 04/28/2021 15:45   DG Abdomen Acute W/Chest  Result Date: 04/28/2021 CLINICAL DATA:  Postoperative, abdominal pain, concern for sepsis EXAM: DG ABDOMEN ACUTE WITH 1 VIEW CHEST COMPARISON:  04/13/2021 FINDINGS: Percutaneous gastrojejunostomy tube. Mildly distended air and fluid-filled loops of small bowel in the low central abdomen, measuring up to 4.0 cm in caliber. Scattered gas and stool present to the descending colon. No free air. No radiopaque calculi or other significant radiographic abnormality is seen. Heart size and mediastinal contours are within normal limits. Both lungs are clear. IMPRESSION: 1. Mildly distended air and fluid-filled loops of small bowel in the low central abdomen, measuring up to 4.0 cm in caliber. Scattered gas and stool present to the descending colon. Findings suggest postoperative ileus or developing small bowel obstruction. No free air. 2.  No acute abnormality of the  lungs. Electronically Signed   By: Delanna Ahmadi M.D.   On: 04/28/2021 13:56    Anti-infectives: Anti-infectives (From admission, onward)    None        Assessment/Plan 76 yo female with a locally advanced cancer of the body of the pancreas causing duodenal obstruction and left hydronephrosis. She is 3 weeks s/p gastrojejunal bypass with GJ tube placement. Unfortunately the J tube is malpositioned and the tip is just beyond the GJ anastomosis, and patient previously did not tolerate J tube feeds (had reflux of feeds into the stomach). She was initially tolerating PO intake with her G tube clamped, but in the last few days has had worsening pain, nausea and vomiting and is readmitted with an AKI secondary to dehydration. Her GJ anastomosis is not functioning well yet and she needs an alternative means of nutrition, preferably a J tube. I discussed TPN with her and she refused because her PICC line caused pain the last time she was here. I discussed trying to have her J tube repositioned fluoroscopically, vs placing a direct J tube, but she is resistant to further procedures. She is concerned about delaying treatment for her cancer but I explained that she will not be able to tolerate chemotherapy without adequate nutrition and hydration. If we do not have a way to hydrate and feed her, she will likely have repeated readmissions until her [place] bypass starts to function, which can often take many weeks. She says she will think about whether or not she wants to have  her J tube replaced. - Clear liquids for comfort, vent G tube as needed for nausea - Continue IV fluid hydration - Pain control: prn dilaudid and oxycodone - Hospitalist consulted for assistance with management of hypertension and diabetes. Patient is likely not absorbing any PO meds. - VTE: lovenox, SCDs - Ambulate TID - Dispo: inpatient, med-surg floor    LOS: 1 day    Michaelle Birks, MD [facility] General,  Hepatobiliary and Pancreatic Surgery 04/29/21 8:09 AM

## 2021-04-29 NOTE — Assessment & Plan Note (Addendum)
Last hemoglobin A1c was 6.4 on 02/2021.  Blood sugars have been low normal.  Patient had initially been placed on moderate sliding scale of insulin, but is not on insulin at baseline. She has continued to have poor p.o. intake. -Continue hypoglycemic protocol -Continue CBGs before every meal -Changed from moderate to a sensitive sliding scale insulin with meals and discontinued insulin at night

## 2021-04-29 NOTE — Evaluation (Signed)
Occupational Therapy Evaluation Patient Details Name: Leah Pruitt MRN: [id number] DOB: February 08, 1946 Today's Date: 04/29/2021   History of Present Illness Pt. is a 76 yr old F admitted on 04/28/21 with c/o abd pain and excessive drainage from JP drain s/p recent pancreatic bypass (~7 days ago). Imaging (+) for SBO. PMH: arthritis, DM, HTN, migraines, osteoporosis, Colles' fx R radius, gastrojejunostomy bypass, pancreatic CA   Clinical Impression   Pt admitted with the above diagnoses. At baseline pt is mod I to independent with basic ADLs. Pt initially refused OOB activity but realized she did need to use the bathroom. Pt currently is mod I with ADLs. Pt completed bed mobility, walked to/from bathroom, got on/off toilet. Pt adjusting bed linens prior to returning to bed. Pt's spouse present and involved throughout session. Pt appears to be at/close to baseline with basic ADLs. No further acute OT needs indicated. OT signing off.       Recommendations for follow up therapy are one component of a multi-disciplinary discharge planning process, led by the attending physician.  Recommendations may be updated based on patient status, additional functional criteria and insurance authorization.   Follow Up Recommendations  No OT follow up    Assistance Recommended at Discharge Intermittent Supervision/Assistance  Patient can return home with the following Assistance with cooking/housework;Assist for transportation    Functional Status Assessment  Patient has not had a recent decline in their functional status  Equipment Recommendations  None recommended by OT    Recommendations for Other Services       Precautions / Restrictions Precautions Precautions: None Precaution Comments: GJ tube, abdominal precautions for comfort Restrictions Weight Bearing Restrictions: No      Mobility Bed Mobility Overal bed mobility: Independent                  Transfers Overall transfer level:  Modified independent   Transfers: Sit to/from Stand Sit to Stand: Modified independent (Device/Increase time)           General transfer comment: to/from EOB and comfort height toilet. extra time, no physical assist or external support needed this session.      Balance Overall balance assessment: Needs assistance Sitting-balance support: Feet supported, Single extremity supported, No upper extremity supported Sitting balance-Leahy Scale: Good     Standing balance support: No upper extremity supported, During functional activity Standing balance-Leahy Scale: Good Standing balance comment: pt adjusting bed sheets prior to lying down, no LOB                           ADL either performed or assessed with clinical judgement   ADL Overall ADL's : Modified independent;At baseline                                       General ADL Comments: Pt completed bed mobility, walked to the bathroom, toilet transfer at mod I level. Spouse present and assisting pt with equipment/lines     Vision         Perception     Praxis      Pertinent Vitals/Pain Pain Assessment Pain Assessment: Faces Faces Pain Scale: Hurts even more Pain Location: Abdomen Pain Descriptors / Indicators: Discomfort, Aching, Throbbing Pain Intervention(s): Limited activity within patient's tolerance     Hand Dominance Right   Extremity/Trunk Assessment Upper Extremity Assessment Upper Extremity Assessment: Overall WFL for  tasks assessed   Lower Extremity Assessment Lower Extremity Assessment: Defer to PT evaluation   Cervical / Trunk Assessment Cervical / Trunk Assessment: Other exceptions Cervical / Trunk Exceptions: GJ tube recent and surgery and midline incision   Communication Communication Communication: No difficulties;Prefers language other than English (Spouse assists with translation)   Cognition Arousal/Alertness: Awake/alert Behavior During Therapy: WFL for  tasks assessed/performed Overall Cognitive Status: Within Functional Limits for tasks assessed                                 General Comments: Easily irritated     General Comments  Spouse present throughout session and very attentive to pt's needs.    Exercises     Shoulder Instructions      Home Living Family/patient expects to be discharged to:: Private residence Living Arrangements: Spouse/significant other Available Help at Discharge: Family;Available 24 hours/day Type of Home: House Home Access: Level entry     Home Layout: One level     Bathroom Shower/Tub: Teacher, early years/pre: Standard     Home Equipment: Conservation officer, nature (2 wheels)          Prior Functioning/Environment Prior Level of Function : Independent/Modified Independent             Mobility Comments: Typically mod I with ADLs and functional mobility per spouse          OT Problem List:        OT Treatment/Interventions:      OT Goals(Current goals can be found in the care plan section) Acute Rehab OT Goals Patient Stated Goal: home. rest  OT Frequency:      Co-evaluation              AM-PAC OT "6 Clicks" Daily Activity     Outcome Measure Help from another person eating meals?: Total (NPO) Help from another person taking care of personal grooming?: None Help from another person toileting, which includes using toliet, bedpan, or urinal?: None Help from another person bathing (including washing, rinsing, drying)?: A Little Help from another person to put on and taking off regular upper body clothing?: None Help from another person to put on and taking off regular lower body clothing?: None 6 Click Score: 20   End of Session Nurse Communication: Mobility status  Activity Tolerance: Patient limited by pain;Patient tolerated treatment well Patient left: in bed;with call bell/phone within reach;with family/visitor present  OT Visit Diagnosis:  Unsteadiness on feet (R26.81);Pain                Time: 1354-1405 OT Time Calculation (min): 11 min Charges:  OT General Charges $OT Visit: 1 Visit OT Evaluation $OT Eval Low Complexity: [place], OT Acute Rehabilitation Services Office: [phone]   Hortencia Pilar 04/29/2021, 2:17 PM

## 2021-04-30 DIAGNOSIS — K56609 Unspecified intestinal obstruction, unspecified as to partial versus complete obstruction: Secondary | ICD-10-CM | POA: Diagnosis not present

## 2021-04-30 DIAGNOSIS — D72829 Elevated white blood cell count, unspecified: Secondary | ICD-10-CM

## 2021-04-30 DIAGNOSIS — E871 Hypo-osmolality and hyponatremia: Secondary | ICD-10-CM

## 2021-04-30 LAB — BASIC METABOLIC PANEL
Anion gap: 12 (ref 5–15)
BUN: 13 mg/dL (ref 8–23)
CO2: 17 mmol/L — ABNORMAL LOW (ref 22–32)
Calcium: 8.8 mg/dL — ABNORMAL LOW (ref 8.9–10.3)
Chloride: 104 mmol/L (ref 98–111)
Creatinine, Ser: 1.02 mg/dL — ABNORMAL HIGH (ref 0.44–1.00)
GFR, Estimated: 58 mL/min — ABNORMAL LOW (ref >60)
Glucose, Bld: 78 mg/dL (ref 70–99)
Potassium: 4.3 mmol/L (ref 3.5–5.1)
Sodium: 133 mmol/L — ABNORMAL LOW (ref 135–145)

## 2021-04-30 LAB — GLUCOSE, CAPILLARY
Glucose-Capillary: 76 mg/dL (ref 70–99)
Glucose-Capillary: 71 mg/dL (ref 70–99)
Glucose-Capillary: 68 mg/dL — ABNORMAL LOW (ref 70–99)
Glucose-Capillary: 110 mg/dL — ABNORMAL HIGH (ref 70–99)

## 2021-04-30 LAB — HEMOGLOBIN A1C
Hgb A1c MFr Bld: 5.9 % — ABNORMAL HIGH (ref 4.8–5.6)
Mean Plasma Glucose: 122.63 mg/dL

## 2021-04-30 MED ORDER — DEXTROSE-NACL 5-0.9 % IV SOLN: INTRAVENOUS | Status: DC

## 2021-04-30 NOTE — Assessment & Plan Note (Signed)
-  Discussed with patient the possible need of TPN in the interim if GI is unable to get GJ tube functioning.

## 2021-04-30 NOTE — Progress Notes (Signed)
PROGRESS NOTE    Leah Pruitt  DUK:[id number] DOB: 1946-03-18 DOA: 04/28/2021 PCP: Emmaline Kluver, MD   Brief Narrative:  Leah Pruitt is a 76 y.o. female with past medical history of hypertension, diabetes mellitus type 2, GERD, and pancreatic mass s/p exploratory laparotomy with gastrojejunostomy tube placement on 1/13 by Dr. Zenia Resides for duodenal obstruction presented with complaints of being unable to tolerate oral intake due to nausea, vomiting, abdominal distention, and pain.  Previously she notes she had been on TPN, but had discomfort from the PICC line in her arm and states that it made her blood sugars elevated.  Furthermore, she does not want to nasogastric tube placed either.  She would like the [place] tube that was placed to be able to be used.  Patient was admitted under general surgery and hospitalist were consulted for medical comanagement of hypertension and diabetes.  Assessment & Plan:   Principal Problem:   Small bowel obstruction secondary to pancreatic cancer Active Problems:   Essential (primary) hypertension   Gastro-esophageal reflux disease without esophagitis   Type 2 diabetes mellitus without complication (HCC)   Protein-calorie malnutrition, severe   Pancreatic cancer (HCC)   Leukocytosis   Hyponatremia  Nausea/vomiting/history of ex laparotomy, biopsy of pancreatic mass, GJ bypass with placement of GJ tube: Management per primary team General surgery.  Type 2 diabetes mellitus: Recent hemoglobin A1c about a month ago was 6.4.  She is not taking any medications or insulin at home for this.  Currently her blood sugar is running low.  She is on SSI only.  Low blood sugar likely due to low p.o. intake.  We will check hemoglobin A1c.  Hypertension: Hypertension is mentioned in the H&P.  I am not really sure if she has the history are not as she does not seem to be on any medications and yet her blood pressures controlled.  We will watch for another  day.  AKI/poor p.o. intake/dehydration: AKI almost resolving.  She remains on IV fluids.  DVT prophylaxis: enoxaparin (LOVENOX) injection 30 mg Start: 04/28/21 2200 Place and maintain sequential compression device Start: 04/28/21 1555 SCDs Start: 04/28/21 1542   Code Status: Full Code  Family Communication: Husband present at bedside.  Plan of care discussed with husband  Status is: Inpatient Remains inpatient appropriate because: Per primary service.   Estimated body mass index is 21.16 kg/m as calculated from the following:   Height as of this encounter: 5\' 1"  (1.549 m).   Weight as of this encounter: 50.8 kg.    Nutritional Assessment: Body mass index is 21.16 kg/m.Marland Kitchen Seen by dietician.  I agree with the assessment and plan as outlined below: Nutrition Status:        . Skin Assessment: I have examined the patient's skin and I agree with the wound assessment as performed by the wound care RN as outlined below:    Consultants:  TRH  Procedures:  None  Antimicrobials:  Anti-infectives (From admission, onward)    None         Subjective: Seen and examined.  Husband at the bedside and helping with translating.  Patient complains of nausea and abdominal pain.  No better than yesterday.  She is sitting at the edge of the bed.  Objective: Vitals:   04/29/21 1725 04/29/21 2135 04/30/21 0548 04/30/21 0740  BP: (!) 110/57 129/63 122/81 117/64  Pulse: 90 91 (!) 106 93  Resp: 17 16 16 19   Temp: 98.2 F (36.8 C) 98.6 F (37 C) (!)  97.5 F (36.4 C) 97.8 F (36.6 C)  TempSrc: Oral Oral Oral Oral  SpO2: 100% 100% 100% 100%  Weight:      Height:        Intake/Output Summary (Last 24 hours) at 04/30/2021 1243 Last data filed at 04/30/2021 7619 Gross per 24 hour  Intake 290 ml  Output --  Net 290 ml   Filed Weights   04/28/21 1255  Weight: 50.8 kg    Examination:  General exam: Appears slightly uncomfortable due to nausea. Respiratory system: Clear to  auscultation. Respiratory effort normal. Cardiovascular system: S1 & S2 heard, RRR. No JVD, murmurs, rubs, gallops or clicks. No pedal edema. Gastrointestinal system: Abdomen is nondistended, soft and generalized tenderness. No organomegaly or masses felt. Normal bowel sounds heard. Central nervous system: Alert and oriented. No focal neurological deficits. Extremities: Symmetric 5 x 5 power.    Data Reviewed: I have personally reviewed following labs and imaging studies  CBC: Recent Labs  Lab 04/28/21 1305 04/29/21 0126  WBC 12.4* 11.0*  NEUTROABS 11.5*  --   HGB 11.4* 9.7*  HCT 35.1* 30.2*  MCV 88.6 88.3  PLT 657* 509*   Basic Metabolic Panel: Recent Labs  Lab 04/28/21 1305 04/29/21 0126 04/30/21 0809  NA 130* 130* 133*  K 4.6 4.6 4.3  CL 92* 99 104  CO2 25 22 17*  GLUCOSE 176* 123* 78  BUN 20 20 13   CREATININE 1.45* 1.26* 1.02*  CALCIUM 10.0 8.9 8.8*  MG  --  2.1  --   PHOS  --  4.4  --    GFR: Estimated Creatinine Clearance: 36.5 mL/min (A) (by C-G formula based on SCr of 1.02 mg/dL (H)). Liver Function Tests: Recent Labs  Lab 04/28/21 1305  AST 18  ALT 10  ALKPHOS 104  BILITOT 0.5  PROT 7.6  ALBUMIN 4.1   Recent Labs  Lab 04/28/21 1305  LIPASE 21   No results for input(s): AMMONIA in the last 168 hours. Coagulation Profile: Recent Labs  Lab 04/28/21 1330  INR 1.1   Cardiac Enzymes: No results for input(s): CKTOTAL, CKMB, CKMBINDEX, TROPONINI in the last 168 hours. BNP (last 3 results) No results for input(s): PROBNP in the last 8760 hours. HbA1C: No results for input(s): HGBA1C in the last 72 hours. CBG: Recent Labs  Lab 04/29/21 1159 04/29/21 1715 04/29/21 2138 04/30/21 0739 04/30/21 1141  GLUCAP 93 100* 89 76 71   Lipid Profile: No results for input(s): CHOL, HDL, LDLCALC, TRIG, CHOLHDL, LDLDIRECT in the last 72 hours. Thyroid Function Tests: No results for input(s): TSH, T4TOTAL, FREET4, T3FREE, THYROIDAB in the last 72  hours. Anemia Panel: No results for input(s): VITAMINB12, FOLATE, FERRITIN, TIBC, IRON, RETICCTPCT in the last 72 hours. Sepsis Labs: Recent Labs  Lab 04/28/21 1329 04/28/21 1500  LATICACIDVEN 1.3 1.3    Recent Results (from the past 240 hour(s))  Blood Culture (routine x 2)     Status: None (Preliminary result)   Collection Time: 04/28/21 12:38 PM   Specimen: BLOOD  Result Value Ref Range Status   Specimen Description BLOOD LEFT ANTECUBITAL  Final   Special Requests   Final    BOTTLES DRAWN AEROBIC AND ANAEROBIC Blood Culture results may not be optimal due to an inadequate volume of blood received in culture bottles   Culture   Final    NO GROWTH 2 DAYS Performed at [facility] [address] [place], [place] [address]    Report Status PENDING  Incomplete  Resp Panel by RT-PCR (Flu A&B, Covid) Nasopharyngeal Swab     Status: None   Collection Time: 04/28/21 12:38 PM   Specimen: Nasopharyngeal Swab; Nasopharyngeal(NP) swabs in vial transport medium  Result Value Ref Range Status   SARS Coronavirus 2 by RT PCR NEGATIVE NEGATIVE Final    Comment: (NOTE) SARS-CoV-2 target nucleic acids are NOT DETECTED.  The SARS-CoV-2 RNA is generally detectable in upper respiratory specimens during the acute phase of infection. The lowest concentration of SARS-CoV-2 viral copies this assay can detect is 138 copies/mL. A negative result does not preclude SARS-Cov-2 infection and should not be used as the sole basis for treatment or other patient management decisions. A negative result may occur with  improper specimen collection/handling, submission of specimen other than nasopharyngeal swab, presence of viral mutation(s) within the areas targeted by this assay, and inadequate number of viral copies(<138 copies/mL). A negative result must be combined with clinical observations, patient history, and epidemiological information. The expected result is Negative.  Fact Sheet for  Patients:  EntrepreneurPulse.com.au  Fact Sheet for Healthcare Providers:  IncredibleEmployment.be  This test is no t yet approved or cleared by the [place] FDA and  has been authorized for detection and/or diagnosis of SARS-CoV-2 by FDA under an Emergency Use Authorization (EUA). This EUA will remain  in effect (meaning this test can be used) for the duration of the COVID-19 declaration under Section 564(b)(1) of the Act, 21 U.S.C.section 360bbb-3(b)(1), unless the authorization is terminated  or revoked sooner.       Influenza A by PCR NEGATIVE NEGATIVE Final   Influenza B by PCR NEGATIVE NEGATIVE Final    Comment: (NOTE) The Xpert Xpress SARS-CoV-2/FLU/RSV plus assay is intended as an aid in the diagnosis of influenza from Nasopharyngeal swab specimens and should not be used as a sole basis for treatment. Nasal washings and aspirates are unacceptable for Xpert Xpress SARS-CoV-2/FLU/RSV testing.  Fact Sheet for Patients: EntrepreneurPulse.com.au  Fact Sheet for Healthcare Providers: IncredibleEmployment.be  This test is not yet approved or cleared by the [place] FDA and has been authorized for detection and/or diagnosis of SARS-CoV-2 by FDA under an Emergency Use Authorization (EUA). This EUA will remain in effect (meaning this test can be used) for the duration of the COVID-19 declaration under Section 564(b)(1) of the Act, 21 U.S.C. section 360bbb-3(b)(1), unless the authorization is terminated or revoked.  Performed at [facility], [place] [address], [place], [place] [address]   Blood Culture (routine x 2)     Status: None (Preliminary result)   Collection Time: 04/28/21  1:29 PM   Specimen: BLOOD  Result Value Ref Range Status   Specimen Description BLOOD LEFT ANTECUBITAL  Final   Special Requests   Final    BOTTLES DRAWN AEROBIC AND ANAEROBIC Blood Culture adequate volume    Culture   Final    NO GROWTH 2 DAYS Performed at [facility], [place] [address] [place], [place] [address]    Report Status PENDING  Incomplete     Radiology Studies: CT ABDOMEN PELVIS WO CONTRAST  Result Date: 04/28/2021 CLINICAL DATA:  Postop, abdominal pain, concern for sepsis EXAM: CT ABDOMEN AND PELVIS WITHOUT CONTRAST TECHNIQUE: Multidetector CT imaging of the abdomen and pelvis was performed following the standard protocol without IV contrast. RADIATION DOSE REDUCTION: This exam was performed according to the departmental dose-optimization program which includes automated exposure control, adjustment of the mA and/or kV according to patient size and/or use  of iterative reconstruction technique. COMPARISON:  04/28/2021, 04/14/2021 FINDINGS: Lower chest: No acute pleural or parenchymal lung disease. Hepatobiliary: Gallbladder is surgically absent. Unremarkable unenhanced appearance of the liver. Pancreas: Stable pancreatic atrophy. Ill-defined 3.8 x 3.4 cm soft tissue mass centered between the pancreatic tail and distal duodenum again noted, not appreciably changed. Spleen: Unremarkable unenhanced appearance. Adrenals/Urinary Tract: Stable severe left-sided hydronephrosis and left renal atrophy. This is likely due to infiltration or extrinsic mass effect upon the proximal left ureter by the ill-defined mass described above. Right kidney is stable. Bladder is decompressed, limiting its evaluation. The adrenals are unremarkable. Stomach/Bowel: A percutaneous gastrojejunostomy tube is identified, distal tip extending through the gastrojejunostomy and residing within the proximal jejunal limb. There is moderate distention of the stomach, proximal duodenum, and efferent jejunal limb distal to the gastrojejunostomy. The distended jejunal limb extends into the lower pelvis, measuring up to 3.3 cm in diameter. Gas fluid levels are identified. The distal jejunum and ileum are decompressed. Minimal retained  stool within the colon. Vascular/Lymphatic: Aortic atherosclerosis. No enlarged abdominal or pelvic lymph nodes. Reproductive: Uterus and bilateral adnexa are unremarkable. Other: No free fluid or free intraperitoneal gas. No abdominal wall hernia. Musculoskeletal: No acute or destructive bony lesions. Reconstructed images demonstrate no additional findings. IMPRESSION: 1. Postsurgical changes from gastrojejunostomy, with percutaneous gastrojejunostomy tube extending through the gastrojejunostomy site and residing within the proximal aspect of the efferent jejunal limb. 2. Moderate distension of the stomach, proximal duodenum, and efferent jejunal limb as above, to the level of the mid lower pelvis. Findings are concerning for developing small bowel obstruction involving the efferent jejunal limb, versus postoperative ileus. 3. Stable ill-defined mass centered between the pancreatic tail and distal duodenum, with mass effect upon the duodenum and obstruction of the proximal left ureter as described above. 4. Stable severe left hydronephrosis and left renal atrophy. 5.  Aortic Atherosclerosis (ICD10-I70.0). Electronically Signed   By: Randa Ngo M.D.   On: 04/28/2021 15:45   DG Abdomen Acute W/Chest  Result Date: 04/28/2021 CLINICAL DATA:  Postoperative, abdominal pain, concern for sepsis EXAM: DG ABDOMEN ACUTE WITH 1 VIEW CHEST COMPARISON:  04/13/2021 FINDINGS: Percutaneous gastrojejunostomy tube. Mildly distended air and fluid-filled loops of small bowel in the low central abdomen, measuring up to 4.0 cm in caliber. Scattered gas and stool present to the descending colon. No free air. No radiopaque calculi or other significant radiographic abnormality is seen. Heart size and mediastinal contours are within normal limits. Both lungs are clear. IMPRESSION: 1. Mildly distended air and fluid-filled loops of small bowel in the low central abdomen, measuring up to 4.0 cm in caliber. Scattered gas and stool present  to the descending colon. Findings suggest postoperative ileus or developing small bowel obstruction. No free air. 2.  No acute abnormality of the lungs. Electronically Signed   By: Delanna Ahmadi M.D.   On: 04/28/2021 13:56    Scheduled Meds:  docusate sodium  100 mg Oral BID   enoxaparin (LOVENOX) injection  30 mg Subcutaneous Q24H   insulin aspart  0-9 Units Subcutaneous TID WC   pantoprazole (PROTONIX) IV  40 mg Intravenous Q12H   Continuous Infusions:  sodium chloride 100 mL/hr at 04/30/21 1108     LOS: 2 days   Time spent: 27 minutes  Darliss Cheney, MD [facility]  04/30/2021, 12:43 PM  Please page via [facility] and do not message via secure chat for urgent patient care matters. Secure chat can be used for non urgent patient care matters.  How to contact the [facility] Attending or Consulting provider [place] or covering provider during after hours [place], for this patient?  Check the care team in [facility] and look for a) attending/consulting TRH provider listed and b) the [facility] team listed. Page or secure chat [phone]. Log into [email or web address] and use [place]'s universal password to access. If you do not have the password, please contact the hospital operator. Locate the [facility] provider you are looking for under [facility] and page to a number that you can be directly reached. If you still have difficulty reaching the provider, please page the [facility] (Director on Call) for the Hospitalists listed on amion for assistance.

## 2021-04-30 NOTE — Plan of Care (Signed)
°  Problem: Clinical Measurements: Goal: Will remain free from infection Outcome: Progressing   Problem: Coping: Goal: Level of anxiety will decrease Outcome: Progressing   Problem: Elimination: Goal: Will not experience complications related to bowel motility Outcome: Not Progressing   Problem: Pain Managment: Goal: General experience of comfort will improve Outcome: Not Progressing   Problem: Safety: Goal: Ability to remain free from injury will improve Outcome: Progressing

## 2021-04-30 NOTE — Progress Notes (Signed)
Subjective: CT 2/2 showed gastric distension, small bowel is mostly decompressed. Ongoing n/v. She vented her tube last night with some relief but not this morning.  Cr improving, almost normal   Objective: Vital signs in last 24 hours: Temp:  [97.5 F (36.4 C)-98.6 F (37 C)] 97.8 F (36.6 C) (02/04 0740) Pulse Rate:  [90-106] 93 (02/04 0740) Resp:  [16-19] 19 (02/04 0740) BP: (110-129)/(57-81) 117/64 (02/04 0740) SpO2:  [100 %] 100 % (02/04 0740)    Intake/Output from previous day: 02/03 0701 - 02/04 0700 In: 100 [P.O.:100] Out: -  Intake/Output this shift: Total I/O In: 240 [P.O.:240] Out: -   PE: General: resting comfortably, NAD Neuro: alert and oriented, no focal deficits Resp: normal work of breathing on room air Abdomen: soft, nondistended, nontender. Midline incision healing well. GJ tube in place, capped. Extremities: warm and well-perfused   Lab Results:  Recent Labs    04/28/21 1305 04/29/21 0126  WBC 12.4* 11.0*  HGB 11.4* 9.7*  HCT 35.1* 30.2*  PLT 657* 504*   BMET Recent Labs    04/29/21 0126 04/30/21 0809  NA 130* 133*  K 4.6 4.3  CL 99 104  CO2 22 17*  GLUCOSE 123* 78  BUN 20 13  CREATININE 1.26* 1.02*  CALCIUM 8.9 8.8*   PT/INR Recent Labs    04/28/21 1330  LABPROT 14.1  INR 1.1   CMP     Component Value Date/Time   NA 133 (L) 04/30/2021 0809   NA 133 (A) 03/31/2021 0000   K 4.3 04/30/2021 0809   CL 104 04/30/2021 0809   CO2 17 (L) 04/30/2021 0809   GLUCOSE 78 04/30/2021 0809   BUN 13 04/30/2021 0809   BUN 17 03/31/2021 0000   CREATININE 1.02 (H) 04/30/2021 0809   CALCIUM 8.8 (L) 04/30/2021 0809   PROT 7.6 04/28/2021 1305   ALBUMIN 4.1 04/28/2021 1305   AST 18 04/28/2021 1305   ALT 10 04/28/2021 1305   ALKPHOS 104 04/28/2021 1305   BILITOT 0.5 04/28/2021 1305   GFRNONAA 58 (L) 04/30/2021 0809   GFRAA >60 10/11/2016 1100   Lipase     Component Value Date/Time   LIPASE 21 04/28/2021 1305        Studies/Results: CT ABDOMEN PELVIS WO CONTRAST  Result Date: 04/28/2021 CLINICAL DATA:  Postop, abdominal pain, concern for sepsis EXAM: CT ABDOMEN AND PELVIS WITHOUT CONTRAST TECHNIQUE: Multidetector CT imaging of the abdomen and pelvis was performed following the standard protocol without IV contrast. RADIATION DOSE REDUCTION: This exam was performed according to the departmental dose-optimization program which includes automated exposure control, adjustment of the mA and/or kV according to patient size and/or use of iterative reconstruction technique. COMPARISON:  04/28/2021, 04/14/2021 FINDINGS: Lower chest: No acute pleural or parenchymal lung disease. Hepatobiliary: Gallbladder is surgically absent. Unremarkable unenhanced appearance of the liver. Pancreas: Stable pancreatic atrophy. Ill-defined 3.8 x 3.4 cm soft tissue mass centered between the pancreatic tail and distal duodenum again noted, not appreciably changed. Spleen: Unremarkable unenhanced appearance. Adrenals/Urinary Tract: Stable severe left-sided hydronephrosis and left renal atrophy. This is likely due to infiltration or extrinsic mass effect upon the proximal left ureter by the ill-defined mass described above. Right kidney is stable. Bladder is decompressed, limiting its evaluation. The adrenals are unremarkable. Stomach/Bowel: A percutaneous gastrojejunostomy tube is identified, distal tip extending through the gastrojejunostomy and residing within the proximal jejunal limb. There is moderate distention of the stomach, proximal duodenum, and efferent jejunal limb  distal to the gastrojejunostomy. The distended jejunal limb extends into the lower pelvis, measuring up to 3.3 cm in diameter. Gas fluid levels are identified. The distal jejunum and ileum are decompressed. Minimal retained stool within the colon. Vascular/Lymphatic: Aortic atherosclerosis. No enlarged abdominal or pelvic lymph nodes. Reproductive: Uterus and bilateral  adnexa are unremarkable. Other: No free fluid or free intraperitoneal gas. No abdominal wall hernia. Musculoskeletal: No acute or destructive bony lesions. Reconstructed images demonstrate no additional findings. IMPRESSION: 1. Postsurgical changes from gastrojejunostomy, with percutaneous gastrojejunostomy tube extending through the gastrojejunostomy site and residing within the proximal aspect of the efferent jejunal limb. 2. Moderate distension of the stomach, proximal duodenum, and efferent jejunal limb as above, to the level of the mid lower pelvis. Findings are concerning for developing small bowel obstruction involving the efferent jejunal limb, versus postoperative ileus. 3. Stable ill-defined mass centered between the pancreatic tail and distal duodenum, with mass effect upon the duodenum and obstruction of the proximal left ureter as described above. 4. Stable severe left hydronephrosis and left renal atrophy. 5.  Aortic Atherosclerosis (ICD10-I70.0). Electronically Signed   By: Randa Ngo M.D.   On: 04/28/2021 15:45   DG Abdomen Acute W/Chest  Result Date: 04/28/2021 CLINICAL DATA:  Postoperative, abdominal pain, concern for sepsis EXAM: DG ABDOMEN ACUTE WITH 1 VIEW CHEST COMPARISON:  04/13/2021 FINDINGS: Percutaneous gastrojejunostomy tube. Mildly distended air and fluid-filled loops of small bowel in the low central abdomen, measuring up to 4.0 cm in caliber. Scattered gas and stool present to the descending colon. No free air. No radiopaque calculi or other significant radiographic abnormality is seen. Heart size and mediastinal contours are within normal limits. Both lungs are clear. IMPRESSION: 1. Mildly distended air and fluid-filled loops of small bowel in the low central abdomen, measuring up to 4.0 cm in caliber. Scattered gas and stool present to the descending colon. Findings suggest postoperative ileus or developing small bowel obstruction. No free air. 2.  No acute abnormality of the  lungs. Electronically Signed   By: Delanna Ahmadi M.D.   On: 04/28/2021 13:56    Anti-infectives: Anti-infectives (From admission, onward)    None        Assessment/Plan 76 yo female with a locally advanced cancer of the body of the pancreas causing duodenal obstruction and left hydronephrosis. She is 3 weeks s/p gastrojejunal bypass with GJ tube placement. Unfortunately the J tube is malpositioned and the tip is just beyond the GJ anastomosis, and patient previously did not tolerate J tube feeds (had reflux of feeds into the stomach). She was initially tolerating PO intake with her G tube clamped, but in the last few days has had worsening pain, nausea and vomiting and is readmitted with an AKI secondary to dehydration. Her GJ anastomosis is not functioning well yet and she needs an alternative means of nutrition, preferably a J tube. I discussed TPN with her and she refused because her PICC line caused pain the last time she was here. I discussed trying to have her J tube repositioned fluoroscopically, vs placing a direct J tube, but she is resistant to further procedures. She is concerned about delaying treatment for her cancer but I explained that she will not be able to tolerate chemotherapy without adequate nutrition and hydration. If we do not have a way to hydrate and feed her, she will likely have repeated readmissions until her [place] bypass starts to function, which can often take many weeks. She says she will think about whether  or not she wants to have her J tube replaced.  - Clear liquids for comfort, vent G tube as needed for nausea - Continue IV fluid hydration - Pain control: prn dilaudid and oxycodone - Hospitalist consulted for assistance with management of HTN/DM. Patient is likely not absorbing any PO meds. Appreciate assistance in her care - VTE: lovenox, SCDs - Ambulate TID - Dispo: inpatient, med-surg floor   LOS: 2 days   Nadeen Landau, MD [facility]  Surgery, [place]

## 2021-04-30 NOTE — Assessment & Plan Note (Signed)
Suspect reactive in nature -Continue to monitor

## 2021-04-30 NOTE — Progress Notes (Signed)
Poor oral intake d/t Nausea CBG 68 notified MD received new order for D5%-0.9@100cc /hr.

## 2021-05-01 DIAGNOSIS — K56609 Unspecified intestinal obstruction, unspecified as to partial versus complete obstruction: Secondary | ICD-10-CM | POA: Diagnosis not present

## 2021-05-01 LAB — BASIC METABOLIC PANEL
Anion gap: 11 (ref 5–15)
BUN: 11 mg/dL (ref 8–23)
CO2: 19 mmol/L — ABNORMAL LOW (ref 22–32)
Calcium: 8.6 mg/dL — ABNORMAL LOW (ref 8.9–10.3)
Chloride: 103 mmol/L (ref 98–111)
Creatinine, Ser: 0.97 mg/dL (ref 0.44–1.00)
GFR, Estimated: >60 mL/min (ref >60)
Glucose, Bld: 131 mg/dL — ABNORMAL HIGH (ref 70–99)
Potassium: 3.5 mmol/L (ref 3.5–5.1)
Sodium: 133 mmol/L — ABNORMAL LOW (ref 135–145)

## 2021-05-01 LAB — GLUCOSE, CAPILLARY
Glucose-Capillary: 136 mg/dL — ABNORMAL HIGH (ref 70–99)
Glucose-Capillary: 74 mg/dL (ref 70–99)
Glucose-Capillary: 114 mg/dL — ABNORMAL HIGH (ref 70–99)

## 2021-05-01 MED ORDER — ENOXAPARIN SODIUM 40 MG/0.4ML IJ SOSY
40.0000 mg | PREFILLED_SYRINGE | INTRAMUSCULAR | Status: DC
  Administered 2021-05-01 – 2021-05-04 (×4): 40 mg via SUBCUTANEOUS
  Filled 2021-05-01 (×4): qty 0.4

## 2021-05-01 NOTE — Progress Notes (Signed)
Physical Therapy Treatment & Discharge Patient Details Name: Leah Pruitt MRN: [id number] DOB: Apr 23, 1945 Today's Date: 05/01/2021   History of Present Illness Pt. is a 76 yr old F admitted on 04/28/21 with c/o abd pain and excessive drainage from JP drain s/p recent pancreatic bypass (~7 days ago). Imaging (+) for SBO. PMH: arthritis, DM, HTN, migraines, osteoporosis, Colles' fx R radius, gastrojejunostomy bypass, pancreatic CA    PT Comments    Patient functioning at independent level with no AD. Patient with no apparent balance deficits and states she feels back to her normal self and wanting to return home when possible. Patient deferred hallway ambulation due to gown. No further skilled PT needs required acutely. No PT follow up recommended at this time. PT will sign off.     Recommendations for follow up therapy are one component of a multi-disciplinary discharge planning process, led by the attending physician.  Recommendations may be updated based on patient status, additional functional criteria and insurance authorization.  Follow Up Recommendations  No PT follow up     Assistance Recommended at Discharge PRN  Patient can return home with the following     Equipment Recommendations  None recommended by PT    Recommendations for Other Services       Precautions / Restrictions Precautions Precautions: None Restrictions Weight Bearing Restrictions: No     Mobility  Bed Mobility Overal bed mobility: Independent                  Transfers Overall transfer level: Independent Equipment used: None                    Ambulation/Gait Ambulation/Gait assistance: Independent Gait Distance (Feet): 30 Feet Assistive device: None Gait Pattern/deviations: WFL(Within Functional Limits)       General Gait Details: refused hallway mobility and easily irritated   Stairs             Wheelchair Mobility    Modified Rankin (Stroke Patients Only)        Balance Overall balance assessment: No apparent balance deficits (not formally assessed)                                          Cognition Arousal/Alertness: Awake/alert Behavior During Therapy: WFL for tasks assessed/performed Overall Cognitive Status: Within Functional Limits for tasks assessed                                          Exercises      General Comments General comments (skin integrity, edema, etc.): husband present and translating      Pertinent Vitals/Pain Pain Assessment Pain Assessment: Faces Faces Pain Scale: Hurts a little bit Pain Location: Abdomen Pain Descriptors / Indicators: Discomfort Pain Intervention(s): Monitored during session    Home Living                          Prior Function            PT Goals (current goals can now be found in the care plan section) Acute Rehab PT Goals Patient Stated Goal: to get out of hospital PT Goal Formulation: With patient/family Progress towards PT goals: Goals met/education completed, patient discharged from PT  Frequency    Min 3X/week      PT Plan Discharge plan needs to be updated    Co-evaluation              AM-PAC PT "6 Clicks" Mobility   Outcome Measure  Help needed turning from your back to your side while in a flat bed without using bedrails?: None Help needed moving from lying on your back to sitting on the side of a flat bed without using bedrails?: None Help needed moving to and from a bed to a chair (including a wheelchair)?: None Help needed standing up from a chair using your arms (e.g., wheelchair or bedside chair)?: None Help needed to walk in hospital room?: None Help needed climbing 3-5 steps with a railing? : None 6 Click Score: 24    End of Session   Activity Tolerance: Patient tolerated treatment well Patient left: in bed;with family/visitor present Nurse Communication: Mobility status PT Visit  Diagnosis: Pain     Time: 6004-5997 PT Time Calculation (min) (ACUTE ONLY): 9 min  Charges:  $Gait Training: 8-22 mins                     Tishawna Larouche A. Gilford Rile PT, DPT Acute Rehabilitation Services Pager [phone] Office [phone]    Linna Hoff 05/01/2021, 10:29 AM

## 2021-05-01 NOTE — Progress Notes (Signed)
Subjective: Patient has had multiple bowel movements and says she feels better today. Family has been venting G tube as needed and reports output of about 50 ml each time.   Objective: Vital signs in last 24 hours: Temp:  [98.2 F (36.8 C)-98.7 F (37.1 C)] 98.3 F (36.8 C) (02/05 0738) Pulse Rate:  [97-108] 102 (02/05 0738) Resp:  [16-19] 16 (02/05 0738) BP: (93-132)/(52-80) 113/79 (02/05 0738) SpO2:  [98 %-100 %] 98 % (02/05 0738) Last BM Date: 04/30/21  Intake/Output from previous day: 02/04 0701 - 02/05 0700 In: 1550 [P.O.:560; I.V.:990] Out: 110  Intake/Output this shift: No intake/output data recorded.  PE: General: resting comfortably, NAD Neuro: alert and oriented, no focal deficits Resp: normal work of breathing on room air Abdomen: soft, nondistended, nontender. Midline incision healing well. GJ tube in place, capped. Extremities: warm and well-perfused   Lab Results:  Recent Labs    04/29/21 0126  WBC 11.0*  HGB 9.7*  HCT 30.2*  PLT 504*   BMET Recent Labs    04/30/21 0809 05/01/21 0059  NA 133* 133*  K 4.3 3.5  CL 104 103  CO2 17* 19*  GLUCOSE 78 131*  BUN 13 11  CREATININE 1.02* 0.97  CALCIUM 8.8* 8.6*   PT/INR Recent Labs    04/28/21 1330  LABPROT 14.1  INR 1.1   CMP     Component Value Date/Time   NA 133 (L) 05/01/2021 0059   NA 133 (A) 03/31/2021 0000   K 3.5 05/01/2021 0059   CL 103 05/01/2021 0059   CO2 19 (L) 05/01/2021 0059   GLUCOSE 131 (H) 05/01/2021 0059   BUN 11 05/01/2021 0059   BUN 17 03/31/2021 0000   CREATININE 0.97 05/01/2021 0059   CALCIUM 8.6 (L) 05/01/2021 0059   PROT 7.6 04/28/2021 1305   ALBUMIN 4.1 04/28/2021 1305   AST 18 04/28/2021 1305   ALT 10 04/28/2021 1305   ALKPHOS 104 04/28/2021 1305   BILITOT 0.5 04/28/2021 1305   GFRNONAA >60 05/01/2021 0059   GFRAA >60 10/11/2016 1100   Lipase     Component Value Date/Time   LIPASE 21 04/28/2021 1305       Studies/Results: No results  found.  Anti-infectives: Anti-infectives (From admission, onward)    None        Assessment/Plan 76 yo female with a locally advanced cancer of the body of the pancreas causing duodenal obstruction and left hydronephrosis. Now 3 weeks s/p gastrojejunal bypass with placement of feeding GJ tube. J limb has retracted into the stomach. I again discussed with the patient this morning the possibility of having the J limb repositioned under fluoro. She is resistant to having any more procedures at this time. Since she feels better today she would like to try eating again. I am not optimistic she will tolerate much by mouth without worsening nausea, however we can try a diet again and see how she does over the next few days. I have emphasized that in order to be discharged without tube feeds or TPN, she needs to be able to eat regularly without nausea or the need to vent her G tube excessively, as this can lead to dehydration again at home. If patient is not able to tolerate oral intake, may need to engage palliative to help establish her goals of care. She has expressed clearly that she wants to have her cancer treated, but she is very resistant to having any procedures done or  staying in the hospital, both of which will be necessary during the course of pancreatic cancer treatment. - Allow regular diet today - Creatinine normal, will stop fluids. Continue to monitor electrolytes. - VTE: lovenox, SCDs - Ambulate TID - Dispo: inpatient, med-surg floor     LOS: 3 days    Michaelle Birks, MD [facility] Surgery General, Hepatobiliary and Pancreatic Surgery 05/01/21 1:18 PM

## 2021-05-01 NOTE — Progress Notes (Signed)
PROGRESS NOTE    Leah Pruitt  HLK:[id number] DOB: 01/10/1946 DOA: 04/28/2021 PCP: Emmaline Kluver, MD   Brief Narrative:  Leah Pruitt is a 76 y.o. female with past medical history of hypertension, diabetes mellitus type 2, GERD, and pancreatic mass s/p exploratory laparotomy with gastrojejunostomy tube placement on 1/13 by Dr. Zenia Resides for duodenal obstruction presented with complaints of being unable to tolerate oral intake due to nausea, vomiting, abdominal distention, and pain.  Previously she notes she had been on TPN, but had discomfort from the PICC line in her arm and states that it made her blood sugars elevated.  Furthermore, she does not want to nasogastric tube placed either.  She would like the [address] tube that was placed to be able to be used.  Patient was admitted under general surgery and hospitalist were consulted for medical comanagement of hypertension and diabetes.  Assessment & Plan:   Principal Problem:   Small bowel obstruction secondary to pancreatic cancer Active Problems:   Essential (primary) hypertension   Gastro-esophageal reflux disease without esophagitis   Type 2 diabetes mellitus without complication (HCC)   Protein-calorie malnutrition, severe   Pancreatic cancer (HCC)   Leukocytosis   Hyponatremia  Nausea/vomiting/history of ex laparotomy, biopsy of pancreatic mass, GJ bypass with placement of GJ tube: Management per primary team General surgery.  Type 2 diabetes mellitus: Recent hemoglobin A1c about a month ago was 6.4.  She is not taking any medications or insulin at home for this.  She was running low blood sugar due to decreased p.o. intake, started on dextrose/normal saline at 100 cc/h, blood sugar slightly elevated, reduced frequency to 50 cc/h today.  Hypertension: Hypertension is mentioned in the H&P.  I am not really sure if she has the history are not as she does not seem to be on any medications and yet her blood pressures controlled.  We  will watch for another day.  AKI/poor p.o. intake/dehydration: Resolved.  DVT prophylaxis: enoxaparin (LOVENOX) injection 30 mg Start: 04/28/21 2200 Place and maintain sequential compression device Start: 04/28/21 1555 SCDs Start: 04/28/21 1542   Code Status: Full Code  Family Communication: Husband present at bedside.  Plan of care discussed with husband  Status is: Inpatient Remains inpatient appropriate because: Per primary service.   Estimated body mass index is 21.16 kg/m as calculated from the following:   Height as of this encounter: 5\' 1"  (1.549 m).   Weight as of this encounter: 50.8 kg.    Nutritional Assessment: Body mass index is 21.16 kg/m.Marland Kitchen Seen by dietician.  I agree with the assessment and plan as outlined below: Nutrition Status:        . Skin Assessment: I have examined the patient's skin and I agree with the wound assessment as performed by the wound care RN as outlined below:    Consultants:  TRH  Procedures:  None  Antimicrobials:  Anti-infectives (From admission, onward)    None         Subjective: Patient seen and examined.  Still complains of abdominal pain and nausea.  Objective: Vitals:   04/30/21 1548 04/30/21 2114 05/01/21 0351 05/01/21 0738  BP: (!) 93/52 121/76 132/80 113/79  Pulse: 97 (!) 108 (!) 106 (!) 102  Resp: 19 17 18 16   Temp: 98.2 F (36.8 C) 98.3 F (36.8 C) 98.7 F (37.1 C) 98.3 F (36.8 C)  TempSrc: Oral Oral Oral Oral  SpO2: 100% 100% 98% 98%  Weight:      Height:  Intake/Output Summary (Last 24 hours) at 05/01/2021 1038 Last data filed at 05/01/2021 0525 Gross per 24 hour  Intake 1310 ml  Output 110 ml  Net 1200 ml    Filed Weights   04/28/21 1255  Weight: 50.8 kg    Examination:  General exam: Appears calm and comfortable  Respiratory system: Clear to auscultation. Respiratory effort normal. Cardiovascular system: S1 & S2 heard, RRR. No JVD, murmurs, rubs, gallops or clicks. No pedal  edema. Gastrointestinal system: Abdomen is nondistended, soft and generalized moderate abdominal tenderness. No organomegaly or masses felt. Normal bowel sounds heard. Central nervous system: Alert and oriented. No focal neurological deficits. Extremities: Symmetric 5 x 5 power. Skin: No rashes, lesions or ulcers.  Psychiatry: Judgement and insight appear normal. Mood & affect appropriate.   Data Reviewed: I have personally reviewed following labs and imaging studies  CBC: Recent Labs  Lab 04/28/21 1305 04/29/21 0126  WBC 12.4* 11.0*  NEUTROABS 11.5*  --   HGB 11.4* 9.7*  HCT 35.1* 30.2*  MCV 88.6 88.3  PLT 657* 504*    Basic Metabolic Panel: Recent Labs  Lab 04/28/21 1305 04/29/21 0126 04/30/21 0809 05/01/21 0059  NA 130* 130* 133* 133*  K 4.6 4.6 4.3 3.5  CL 92* 99 104 103  CO2 25 22 17* 19*  GLUCOSE 176* 123* 78 131*  BUN 20 20 13 11   CREATININE 1.45* 1.26* 1.02* 0.97  CALCIUM 10.0 8.9 8.8* 8.6*  MG  --  2.1  --   --   PHOS  --  4.4  --   --     GFR: Estimated Creatinine Clearance: 38.4 mL/min (by C-G formula based on SCr of 0.97 mg/dL). Liver Function Tests: Recent Labs  Lab 04/28/21 1305  AST 18  ALT 10  ALKPHOS 104  BILITOT 0.5  PROT 7.6  ALBUMIN 4.1    Recent Labs  Lab 04/28/21 1305  LIPASE 21    No results for input(s): AMMONIA in the last 168 hours. Coagulation Profile: Recent Labs  Lab 04/28/21 1330  INR 1.1    Cardiac Enzymes: No results for input(s): CKTOTAL, CKMB, CKMBINDEX, TROPONINI in the last 168 hours. BNP (last 3 results) No results for input(s): PROBNP in the last 8760 hours. HbA1C: Recent Labs    04/30/21 0809  HGBA1C 5.9*   CBG: Recent Labs  Lab 04/30/21 0739 04/30/21 1141 04/30/21 1622 04/30/21 2118 05/01/21 0736  GLUCAP 76 71 68* 110* 136*    Lipid Profile: No results for input(s): CHOL, HDL, LDLCALC, TRIG, CHOLHDL, LDLDIRECT in the last 72 hours. Thyroid Function Tests: No results for input(s): TSH,  T4TOTAL, FREET4, T3FREE, THYROIDAB in the last 72 hours. Anemia Panel: No results for input(s): VITAMINB12, FOLATE, FERRITIN, TIBC, IRON, RETICCTPCT in the last 72 hours. Sepsis Labs: Recent Labs  Lab 04/28/21 1329 04/28/21 1500  LATICACIDVEN 1.3 1.3     Recent Results (from the past 240 hour(s))  Blood Culture (routine x 2)     Status: None (Preliminary result)   Collection Time: 04/28/21 12:38 PM   Specimen: BLOOD  Result Value Ref Range Status   Specimen Description BLOOD LEFT ANTECUBITAL  Final   Special Requests   Final    BOTTLES DRAWN AEROBIC AND ANAEROBIC Blood Culture results may not be optimal due to an inadequate volume of blood received in culture bottles   Culture   Final    NO GROWTH 2 DAYS Performed at [facility] [address], [place], [place] [address]  Report Status PENDING  Incomplete  Resp Panel by RT-PCR (Flu A&B, Covid) Nasopharyngeal Swab     Status: None   Collection Time: 04/28/21 12:38 PM   Specimen: Nasopharyngeal Swab; Nasopharyngeal(NP) swabs in vial transport medium  Result Value Ref Range Status   SARS Coronavirus 2 by RT PCR NEGATIVE NEGATIVE Final    Comment: (NOTE) SARS-CoV-2 target nucleic acids are NOT DETECTED.  The SARS-CoV-2 RNA is generally detectable in upper respiratory specimens during the acute phase of infection. The lowest concentration of SARS-CoV-2 viral copies this assay can detect is 138 copies/mL. A negative result does not preclude SARS-Cov-2 infection and should not be used as the sole basis for treatment or other patient management decisions. A negative result may occur with  improper specimen collection/handling, submission of specimen other than nasopharyngeal swab, presence of viral mutation(s) within the areas targeted by this assay, and inadequate number of viral copies(<138 copies/mL). A negative result must be combined with clinical observations, patient history, and epidemiological information. The  expected result is Negative.  Fact Sheet for Patients:  EntrepreneurPulse.com.au  Fact Sheet for Healthcare Providers:  IncredibleEmployment.be  This test is no t yet approved or cleared by the [place] FDA and  has been authorized for detection and/or diagnosis of SARS-CoV-2 by FDA under an Emergency Use Authorization (EUA). This EUA will remain  in effect (meaning this test can be used) for the duration of the COVID-19 declaration under Section 564(b)(1) of the Act, 21 U.S.C.section 360bbb-3(b)(1), unless the authorization is terminated  or revoked sooner.       Influenza A by PCR NEGATIVE NEGATIVE Final   Influenza B by PCR NEGATIVE NEGATIVE Final    Comment: (NOTE) The Xpert Xpress SARS-CoV-2/FLU/RSV plus assay is intended as an aid in the diagnosis of influenza from Nasopharyngeal swab specimens and should not be used as a sole basis for treatment. Nasal washings and aspirates are unacceptable for Xpert Xpress SARS-CoV-2/FLU/RSV testing.  Fact Sheet for Patients: EntrepreneurPulse.com.au  Fact Sheet for Healthcare Providers: IncredibleEmployment.be  This test is not yet approved or cleared by the [place] FDA and has been authorized for detection and/or diagnosis of SARS-CoV-2 by FDA under an Emergency Use Authorization (EUA). This EUA will remain in effect (meaning this test can be used) for the duration of the COVID-19 declaration under Section 564(b)(1) of the Act, 21 U.S.C. section 360bbb-3(b)(1), unless the authorization is terminated or revoked.  Performed at [facility], [place] [address] [place], [place] [address]   Blood Culture (routine x 2)     Status: None (Preliminary result)   Collection Time: 04/28/21  1:29 PM   Specimen: BLOOD  Result Value Ref Range Status   Specimen Description BLOOD LEFT ANTECUBITAL  Final   Special Requests   Final    BOTTLES DRAWN AEROBIC  AND ANAEROBIC Blood Culture adequate volume   Culture   Final    NO GROWTH 2 DAYS Performed at [facility], [place] [address], [place], [place] [address]    Report Status PENDING  Incomplete      Radiology Studies: No results found.  Scheduled Meds:  docusate sodium  100 mg Oral BID   enoxaparin (LOVENOX) injection  30 mg Subcutaneous Q24H   insulin aspart  0-9 Units Subcutaneous TID WC   pantoprazole (PROTONIX) IV  40 mg Intravenous Q12H   Continuous Infusions:  dextrose 5 % and 0.9% NaCl 50 mL/hr at 05/01/21 0849     LOS: 3 days  Time spent: 25 minutes  Darliss Cheney, MD [facility]  05/01/2021, 10:38 AM  Please page via [place] and do not message via secure chat for urgent patient care matters. Secure chat can be used for non urgent patient care matters.  How to contact the [facility] Attending or Consulting provider [place] or covering provider during after hours [place], for this patient?  Check the care team in [facility] and look for a) attending/consulting TR[facility] provider listed and b) the [facility] team listed. Page or secure chat [phone]. Log into [email or web address] and use [place]'s universal password to access. If you do not have the password, please contact the hospital operator. Locate the [facility] provider you are looking for under [facility] and page to a number that you can be directly reached. If you still have difficulty reaching the provider, please page the [facility] (Director on Call) for the Hospitalists listed on amion for assistance.

## 2021-05-02 DIAGNOSIS — K56609 Unspecified intestinal obstruction, unspecified as to partial versus complete obstruction: Secondary | ICD-10-CM | POA: Diagnosis not present

## 2021-05-02 LAB — GLUCOSE, CAPILLARY
Glucose-Capillary: 123 mg/dL — ABNORMAL HIGH (ref 70–99)
Glucose-Capillary: 96 mg/dL (ref 70–99)
Glucose-Capillary: 117 mg/dL — ABNORMAL HIGH (ref 70–99)
Glucose-Capillary: 101 mg/dL — ABNORMAL HIGH (ref 70–99)
Glucose-Capillary: 109 mg/dL — ABNORMAL HIGH (ref 70–99)

## 2021-05-02 LAB — BASIC METABOLIC PANEL
Anion gap: 6 (ref 5–15)
BUN: 9 mg/dL (ref 8–23)
CO2: 23 mmol/L (ref 22–32)
Calcium: 8.5 mg/dL — ABNORMAL LOW (ref 8.9–10.3)
Chloride: 100 mmol/L (ref 98–111)
Creatinine, Ser: 0.92 mg/dL (ref 0.44–1.00)
GFR, Estimated: >60 mL/min (ref >60)
Glucose, Bld: 108 mg/dL — ABNORMAL HIGH (ref 70–99)
Potassium: 3.7 mmol/L (ref 3.5–5.1)
Sodium: 129 mmol/L — ABNORMAL LOW (ref 135–145)

## 2021-05-02 MED ORDER — HYDROMORPHONE HCL 1 MG/ML IJ SOLN
0.5000 mg | INTRAMUSCULAR | Status: DC | PRN
  Administered 2021-05-02 (×2): 1 mg via INTRAVENOUS
  Filled 2021-05-02 (×2): qty 1

## 2021-05-02 MED ORDER — HYDROMORPHONE HCL 1 MG/ML IJ SOLN
0.5000 mg | INTRAMUSCULAR | Status: DC | PRN
  Administered 2021-05-02 – 2021-05-06 (×17): 1 mg via INTRAVENOUS
  Filled 2021-05-02 (×19): qty 1

## 2021-05-02 MED ORDER — ENSURE ENLIVE PO LIQD
237.0000 mL | Freq: Three times a day (TID) | ORAL | Status: DC
  Administered 2021-05-02 – 2021-05-05 (×7): 237 mL via ORAL

## 2021-05-02 MED ORDER — METHOCARBAMOL 1000 MG/10ML IJ SOLN
500.0000 mg | Freq: Four times a day (QID) | INTRAVENOUS | Status: DC | PRN
  Filled 2021-05-02: qty 5

## 2021-05-02 NOTE — Care Management Important Message (Signed)
Important Message  Patient Details  Name: Leah Pruitt MRN: [id number] Date of Birth: 08-11-1945   Medicare Important Message Given:  Yes     Hannah Beat 05/02/2021, 11:51 AM

## 2021-05-02 NOTE — Progress Notes (Signed)
PROGRESS NOTE    Leah Pruitt  NAT:[id number] DOB: 11-Feb-1946 DOA: 04/28/2021 PCP: Emmaline Kluver, MD   Brief Narrative:  Leah Pruitt is a 76 y.o. female with past medical history of hypertension, diabetes mellitus type 2, GERD, and pancreatic mass s/p exploratory laparotomy with gastrojejunostomy tube placement on 1/13 by Dr. Zenia Resides for duodenal obstruction presented with complaints of being unable to tolerate oral intake due to nausea, vomiting, abdominal distention, and pain.  Previously she notes she had been on TPN, but had discomfort from the PICC line in her arm and states that it made her blood sugars elevated.  Furthermore, she does not want to nasogastric tube placed either.  She would like the [place] tube that was placed to be able to be used.  Patient was admitted under general surgery and hospitalist were consulted for medical comanagement of hypertension and diabetes.  Assessment & Plan:   Principal Problem:   Small bowel obstruction secondary to pancreatic cancer Active Problems:   Essential (primary) hypertension   Gastro-esophageal reflux disease without esophagitis   Type 2 diabetes mellitus without complication (HCC)   Protein-calorie malnutrition, severe   Pancreatic cancer (HCC)   Leukocytosis   Hyponatremia  Nausea/vomiting/history of ex laparotomy, biopsy of pancreatic mass, GJ bypass with placement of GJ tube: Management per primary team General surgery.  Type 2 diabetes mellitus: Recent hemoglobin A1c about a month ago was 6.4.  She is not taking any medications or insulin at home for this.  She was running low blood sugar due to decreased p.o. intake, started on dextrose/normal saline at 100 cc/h, eventually fluids were started by general surgery yesterday and she was advanced to regular diet and her blood sugars fairly controlled on SSI only now.  Hypertension: Hypertension is mentioned in the H&P.  I am not really sure if she has the history are not as  she does not seem to be on any medications and yet her blood pressures controlled.  We will watch for another day.  AKI/poor p.o. intake/dehydration: Resolved.  Nutrition consulted.  DVT prophylaxis: enoxaparin (LOVENOX) injection 40 mg Start: 05/01/21 2200 Place and maintain sequential compression device Start: 04/28/21 1555 SCDs Start: 04/28/21 1542   Code Status: Full Code  Family Communication: Husband present at bedside.  Plan of care discussed with husband  Status is: Inpatient Remains inpatient appropriate because: Per primary service.   Estimated body mass index is 21.16 kg/m as calculated from the following:   Height as of this encounter: 5\' 1"  (1.549 m).   Weight as of this encounter: 50.8 kg.    Nutritional Assessment: Body mass index is 21.16 kg/m.Marland Kitchen Seen by dietician.  I agree with the assessment and plan as outlined below: Nutrition Status:        . Skin Assessment: I have examined the patient's skin and I agree with the wound assessment as performed by the wound care RN as outlined below:    Consultants:  TRH  Procedures:  None  Antimicrobials:  Anti-infectives (From admission, onward)    None         Subjective: Patient seen and examined.  Continues to complain of abdominal pain with no improvement however her nausea has improved and she is having bowel movements.  Objective: Vitals:   05/01/21 2100 05/02/21 0357 05/02/21 0747 05/02/21 0750  BP: 133/75 111/68 127/77   Pulse: (!) 108 (!) 105 (!) 110 (!) 110  Resp: 16 18 18    Temp: 99 F (37.2 C) 98.4 F (36.9  C) 99.5 F (37.5 C)   TempSrc: Oral Oral Oral   SpO2: 100% 99% 97% 98%  Weight:      Height:        Intake/Output Summary (Last 24 hours) at 05/02/2021 1028 Last data filed at 05/01/2021 1507 Gross per 24 hour  Intake 815.77 ml  Output --  Net 815.77 ml    Filed Weights   04/28/21 1255  Weight: 50.8 kg    Examination:  General exam: Appears calm and comfortable   Respiratory system: Clear to auscultation. Respiratory effort normal. Cardiovascular system: S1 & S2 heard, RRR. No JVD, murmurs, rubs, gallops or clicks. No pedal edema. Gastrointestinal system: Abdomen is nondistended, soft and severe generalized tenderness. No organomegaly or masses felt. Normal bowel sounds heard. Central nervous system: Alert and oriented. No focal neurological deficits. Extremities: Symmetric 5 x 5 power. Skin: No rashes, lesions or ulcers.  Psychiatry: Judgement and insight appear normal. Mood & affect appropriate.   Data Reviewed: I have personally reviewed following labs and imaging studies  CBC: Recent Labs  Lab 04/28/21 1305 04/29/21 0126  WBC 12.4* 11.0*  NEUTROABS 11.5*  --   HGB 11.4* 9.7*  HCT 35.1* 30.2*  MCV 88.6 88.3  PLT 657* 504*    Basic Metabolic Panel: Recent Labs  Lab 04/28/21 1305 04/29/21 0126 04/30/21 0809 05/01/21 0059 05/02/21 0118  NA 130* 130* 133* 133* 129*  K 4.6 4.6 4.3 3.5 3.7  CL 92* 99 104 103 100  CO2 25 22 17* 19* 23  GLUCOSE 176* 123* 78 131* 108*  BUN 20 20 13 11 9   CREATININE 1.45* 1.26* 1.02* 0.97 0.92  CALCIUM 10.0 8.9 8.8* 8.6* 8.5*  MG  --  2.1  --   --   --   PHOS  --  4.4  --   --   --     GFR: Estimated Creatinine Clearance: 40.5 mL/min (by C-G formula based on SCr of 0.92 mg/dL). Liver Function Tests: Recent Labs  Lab 04/28/21 1305  AST 18  ALT 10  ALKPHOS 104  BILITOT 0.5  PROT 7.6  ALBUMIN 4.1    Recent Labs  Lab 04/28/21 1305  LIPASE 21    No results for input(s): AMMONIA in the last 168 hours. Coagulation Profile: Recent Labs  Lab 04/28/21 1330  INR 1.1    Cardiac Enzymes: No results for input(s): CKTOTAL, CKMB, CKMBINDEX, TROPONINI in the last 168 hours. BNP (last 3 results) No results for input(s): PROBNP in the last 8760 hours. HbA1C: Recent Labs    04/30/21 0809  HGBA1C 5.9*    CBG: Recent Labs  Lab 05/01/21 0736 05/01/21 1204 05/01/21 1706 05/01/21 2200  05/02/21 0800  GLUCAP 136* 74 114* 123* 96    Lipid Profile: No results for input(s): CHOL, HDL, LDLCALC, TRIG, CHOLHDL, LDLDIRECT in the last 72 hours. Thyroid Function Tests: No results for input(s): TSH, T4TOTAL, FREET4, T3FREE, THYROIDAB in the last 72 hours. Anemia Panel: No results for input(s): VITAMINB12, FOLATE, FERRITIN, TIBC, IRON, RETICCTPCT in the last 72 hours. Sepsis Labs: Recent Labs  Lab 04/28/21 1329 04/28/21 1500  LATICACIDVEN 1.3 1.3     Recent Results (from the past 240 hour(s))  Blood Culture (routine x 2)     Status: None (Preliminary result)   Collection Time: 04/28/21 12:38 PM   Specimen: BLOOD  Result Value Ref Range Status   Specimen Description BLOOD LEFT ANTECUBITAL  Final   Special Requests   Final    BOTTLES  DRAWN AEROBIC AND ANAEROBIC Blood Culture results may not be optimal due to an inadequate volume of blood received in culture bottles   Culture   Final    NO GROWTH 4 DAYS Performed at [facility] [address], [place], [place] [address]    Report Status PENDING  Incomplete  Resp Panel by RT-PCR (Flu A&B, Covid) Nasopharyngeal Swab     Status: None   Collection Time: 04/28/21 12:38 PM   Specimen: Nasopharyngeal Swab; Nasopharyngeal(NP) swabs in vial transport medium  Result Value Ref Range Status   SARS Coronavirus 2 by RT PCR NEGATIVE NEGATIVE Final    Comment: (NOTE) SARS-CoV-2 target nucleic acids are NOT DETECTED.  The SARS-CoV-2 RNA is generally detectable in upper respiratory specimens during the acute phase of infection. The lowest concentration of SARS-CoV-2 viral copies this assay can detect is 138 copies/mL. A negative result does not preclude SARS-Cov-2 infection and should not be used as the sole basis for treatment or other patient management decisions. A negative result may occur with  improper specimen collection/handling, submission of specimen other than nasopharyngeal swab, presence of viral mutation(s)  within the areas targeted by this assay, and inadequate number of viral copies(<138 copies/mL). A negative result must be combined with clinical observations, patient history, and epidemiological information. The expected result is Negative.  Fact Sheet for Patients:  EntrepreneurPulse.com.au  Fact Sheet for Healthcare Providers:  IncredibleEmployment.be  This test is no t yet approved or cleared by the [place] FDA and  has been authorized for detection and/or diagnosis of SARS-CoV-2 by FDA under an Emergency Use Authorization (EUA). This EUA will remain  in effect (meaning this test can be used) for the duration of the COVID-19 declaration under Section 564(b)(1) of the Act, 21 U.S.C.section 360bbb-3(b)(1), unless the authorization is terminated  or revoked sooner.       Influenza A by PCR NEGATIVE NEGATIVE Final   Influenza B by PCR NEGATIVE NEGATIVE Final    Comment: (NOTE) The Xpert Xpress SARS-CoV-2/FLU/RSV plus assay is intended as an aid in the diagnosis of influenza from Nasopharyngeal swab specimens and should not be used as a sole basis for treatment. Nasal washings and aspirates are unacceptable for Xpert Xpress SARS-CoV-2/FLU/RSV testing.  Fact Sheet for Patients: EntrepreneurPulse.com.au  Fact Sheet for Healthcare Providers: IncredibleEmployment.be  This test is not yet approved or cleared by the [place] FDA and has been authorized for detection and/or diagnosis of SARS-CoV-2 by FDA under an Emergency Use Authorization (EUA). This EUA will remain in effect (meaning this test can be used) for the duration of the COVID-19 declaration under Section 564(b)(1) of the Act, 21 U.S.C. section 360bbb-3(b)(1), unless the authorization is terminated or revoked.  Performed at [facility], [place] [address], [place], [place] [address]   Blood Culture (routine x 2)     Status: None  (Preliminary result)   Collection Time: 04/28/21  1:29 PM   Specimen: BLOOD  Result Value Ref Range Status   Specimen Description BLOOD LEFT ANTECUBITAL  Final   Special Requests   Final    BOTTLES DRAWN AEROBIC AND ANAEROBIC Blood Culture adequate volume   Culture   Final    NO GROWTH 4 DAYS Performed at [facility], [place] [address], [place], [place] [address]    Report Status PENDING  Incomplete      Radiology Studies: No results found.  Scheduled Meds:  docusate sodium  100 mg Oral BID   enoxaparin (  LOVENOX) injection  40 mg Subcutaneous Q24H   insulin aspart  0-9 Units Subcutaneous TID WC   pantoprazole (PROTONIX) IV  40 mg Intravenous Q12H   Continuous Infusions:     LOS: 4 days   Time spent: 21 minutes  Darliss Cheney, MD [facility]  05/02/2021, 10:28 AM  Please page via [facility] and do not message via secure chat for urgent patient care matters. Secure chat can be used for non urgent patient care matters.  How to contact the [facility] Attending or Consulting provider [place] or covering provider during after hours [place], for this patient?  Check the care team in [facility] and look for a) attending/consulting TRH provider listed and b) the [facility] team listed. Page or secure chat [phone]. Log into [email or web address] and use [place]'s universal password to access. If you do not have the password, please contact the hospital operator. Locate the [facility] provider you are looking for under [facility] and page to a number that you can be directly reached. If you still have difficulty reaching the provider, please page the [facility] (Director on Call) for the Hospitalists listed on amion for assistance.

## 2021-05-02 NOTE — Progress Notes (Signed)
Initial Nutrition Assessment  DOCUMENTATION CODES:  Severe malnutrition in context of acute illness/injury  INTERVENTION:  Continue regular diet as ordered, encourage PO intake Snacks TID between meals Ensure Enlive po TID, each supplement provides 350 kcal and 20 grams of protein. Provided education on maximizing kcal and protein with meals and snacks and on managing gastroparesis. Initiate 48-hout calorie count to determine if pt is meeting nutrition needs orally.   NUTRITION DIAGNOSIS:  Severe Malnutrition (in the context of acute illness) related to nausea, decreased appetite as evidenced by moderate muscle depletion, percent weight loss (10.7% x 3 months).  GOAL:  Patient will meet greater than or equal to 90% of their needs  MONITOR:  PO intake, Supplement acceptance, Weight trends  REASON FOR ASSESSMENT:  Consult Poor PO, Assessment of nutrition requirement/status  ASSESSMENT:  76 y.o. female with history of pancreatic cancer with recent gastrojejunal bypass surgery with GJ tube placement (admitted 1/12-1/24), HTN, GERD, diabetes, osteoporosis presented to ED with abdominal pain, vomiting, and excessive drainage from JP drain  Pt readmitted 3 weeks after gastrojejunostomy bypass surgery. G tube was to be used for venting and J port for feeds. However, imaging shows that j tube has retracted and tip is just beyond the [place] anastomosis. Surgery discussed adjusting tube to place back in correct position but pt declined any further procedures related to the feeding tube at this time. Opted to trial PO intake to see if needs could be met. Pt also declines TPN as she had pain with PICC line during last admission.   Pt resting in bed at the time of assessment. Husband present and assists with nutrition interview. Reports that pt is having a lot of nausea related to her acid reflux today.   At home after last admission, pt seemed to be doing fairly well with her intake until she began to  develop the persistent pain and vomiting prior to this current admission. Husband states that pt has never eaten large amounts, but likes to eat small frequent meals and snacks. Discussed adding ensure and a snack between meals to maximize the amount of kcal and protein pt is being offered in the day. Will also initiate a 480hour kcal count to determine if needs are being met orally. Discussed with RN  Severe weight loss of 10.7% noted in the last 3 months (11/30-2/2). Husband endorses that he can tell she has lost significant weight.   Provided education on managing symptoms of gastroparesis and of adding kcal and protein to meals and snacks.  Average Meal Intake: 2/3-2/6: 33% intake x 3 recorded meals  Nutritionally Relevant Medications: Scheduled Meds:  docusate sodium  100 mg Oral BID   insulin aspart  0-9 Units Subcutaneous TID WC   pantoprazole IV  40 mg Intravenous Q12H   PRN Meds: bisacodyl, diphenhydrAMINE, magnesium hydroxide, meclizine, ondansetron, simethicone  Labs Reviewed: Sodium 129 SBG ranges from 74-123 mg/dL over the last 24 hours HgbA1c 5.9% (2/4)   NUTRITION - FOCUSED PHYSICAL EXAM: Flowsheet Row Most Recent Value  Orbital Region Mild depletion  Upper Arm Region No depletion  Thoracic and Lumbar Region No depletion  Buccal Region Mild depletion  Temple Region Moderate depletion  Clavicle Bone Region Mild depletion  Clavicle and Acromion Bone Region Mild depletion  Scapular Bone Region Mild depletion  Dorsal Hand Mild depletion  Patellar Region Moderate depletion  Anterior Thigh Region Moderate depletion  Posterior Calf Region Moderate depletion  Edema (RD Assessment) Mild  [BLE]  Hair Reviewed  Eyes  Reviewed  Mouth Reviewed  Skin Reviewed  Nails Reviewed   Diet Order:   Diet Order             Diet regular Room service appropriate? Yes; Fluid consistency: Thin  Diet effective now                   EDUCATION NEEDS:  Education needs have been  addressed  Skin:  Skin Assessment: Reviewed RN Assessment  Last BM:  2/4  Height:  Ht Readings from Last 1 Encounters:  04/28/21 _0  (1.549 m)    Weight:  Wt Readings from Last 1 Encounters:  04/28/21 50.8 kg    Ideal Body Weight:  47.7 kg  BMI:  Body mass index is 21.16 kg/m.  Estimated Nutritional Needs:  Kcal:  1600-1800 kcal/d Protein:  80-90 g/d Fluid:  >1.8L/d   Ranell Patrick, RD, LDN Clinical Dietitian RD pager # available in AMION  After hours/weekend pager # available in [facility]

## 2021-05-02 NOTE — Plan of Care (Signed)
°  Problem: Clinical Measurements: Goal: Ability to maintain clinical measurements within normal limits will improve Outcome: Progressing Goal: Will remain free from infection Outcome: Progressing Goal: Diagnostic test results will improve Outcome: Progressing Goal: Respiratory complications will improve Outcome: Progressing Goal: Cardiovascular complication will be avoided Outcome: Progressing   Problem: Nutrition: Goal: Adequate nutrition will be maintained Outcome: Not Progressing

## 2021-05-02 NOTE — Progress Notes (Signed)
° ° °   °  Subjective: No acute changes. Still having bowel movements. Ate a little yesterday. Vented G tube once and drained 150 ml. Still having pain.   Objective: Vital signs in last 24 hours: Temp:  [97.6 F (36.4 C)-99.5 F (37.5 C)] 99.5 F (37.5 C) (02/06 0747) Pulse Rate:  [100-110] 110 (02/06 0750) Resp:  [16-18] 18 (02/06 0747) BP: (111-139)/(68-82) 127/77 (02/06 0747) SpO2:  [97 %-100 %] 98 % (02/06 0750) Last BM Date: 04/30/21  Intake/Output from previous day: 02/05 0701 - 02/06 0700 In: 815.8 [I.V.:815.8] Out: -  Intake/Output this shift: No intake/output data recorded.  PE: General: resting comfortably, NAD Neuro: alert and oriented, no focal deficits Resp: normal work of breathing on room air Abdomen: soft, nondistended, nontender. Midline incision healing well. GJ tube in place, capped. Extremities: warm and well-perfused   Lab Results:  No results for input(s): WBC, HGB, HCT, PLT in the last 72 hours.  BMET Recent Labs    05/01/21 0059 05/02/21 0118  NA 133* 129*  K 3.5 3.7  CL 103 100  CO2 19* 23  GLUCOSE 131* 108*  BUN 11 9  CREATININE 0.97 0.92  CALCIUM 8.6* 8.5*   PT/INR No results for input(s): LABPROT, INR in the last 72 hours.  CMP     Component Value Date/Time   NA 129 (L) 05/02/2021 0118   NA 133 (A) 03/31/2021 0000   K 3.7 05/02/2021 0118   CL 100 05/02/2021 0118   CO2 23 05/02/2021 0118   GLUCOSE 108 (H) 05/02/2021 0118   BUN 9 05/02/2021 0118   BUN 17 03/31/2021 0000   CREATININE 0.92 05/02/2021 0118   CALCIUM 8.5 (L) 05/02/2021 0118   PROT 7.6 04/28/2021 1305   ALBUMIN 4.1 04/28/2021 1305   AST 18 04/28/2021 1305   ALT 10 04/28/2021 1305   ALKPHOS 104 04/28/2021 1305   BILITOT 0.5 04/28/2021 1305   GFRNONAA >60 05/02/2021 0118   GFRAA >60 10/11/2016 1100   Lipase     Component Value Date/Time   LIPASE 21 04/28/2021 1305       Studies/Results: No results found.  Anti-infectives: Anti-infectives (From  admission, onward)    None        Assessment/Plan 76 yo female with a locally advanced cancer of the body of the pancreas causing duodenal obstruction and left hydronephrosis. Now 3 weeks s/p gastrojejunal bypass with placement of feeding GJ tube. J limb has retracted into the stomach. She does not want to have further procedures to have her feeding tube replaced. - Continue regular diet as tolerated. Consult nutrition today. - Vent G tube prn. - Creatinine normal, continue to monitor. - VTE: lovenox, SCDs - Ambulate TID - Dispo: inpatient, med-surg floor.     LOS: 4 days    Michaelle Birks, MD [facility] Surgery General, Hepatobiliary and Pancreatic Surgery 05/02/21 8:19 AM

## 2021-05-03 DIAGNOSIS — K56609 Unspecified intestinal obstruction, unspecified as to partial versus complete obstruction: Secondary | ICD-10-CM | POA: Diagnosis not present

## 2021-05-03 LAB — GLUCOSE, CAPILLARY
Glucose-Capillary: 135 mg/dL — ABNORMAL HIGH (ref 70–99)
Glucose-Capillary: 106 mg/dL — ABNORMAL HIGH (ref 70–99)
Glucose-Capillary: 118 mg/dL — ABNORMAL HIGH (ref 70–99)
Glucose-Capillary: 143 mg/dL — ABNORMAL HIGH (ref 70–99)

## 2021-05-03 LAB — CULTURE, BLOOD (ROUTINE X 2)
Culture: NO GROWTH
Culture: NO GROWTH
Special Requests: ADEQUATE

## 2021-05-03 MED ORDER — PANTOPRAZOLE SODIUM 40 MG PO TBEC
40.0000 mg | DELAYED_RELEASE_TABLET | Freq: Two times a day (BID) | ORAL | Status: DC
  Administered 2021-05-03: 40 mg via ORAL
  Filled 2021-05-03 (×3): qty 1

## 2021-05-03 MED ORDER — METHOCARBAMOL 500 MG PO TABS: 500.0000 mg | ORAL_TABLET | Freq: Three times a day (TID) | ORAL | Status: DC | PRN

## 2021-05-03 MED ORDER — PANTOPRAZOLE SODIUM 40 MG IV SOLR
40.0000 mg | Freq: Once | INTRAVENOUS | Status: AC
  Administered 2021-05-03: 40 mg via INTRAVENOUS
  Filled 2021-05-03: qty 10

## 2021-05-03 NOTE — Progress Notes (Signed)
Calorie Count Note  48 hour calorie count ordered. Initiated at 4:00PM 2/6. So far, limited information available to calculate intake. No information on snack or supplement intake available.  Visited pt in room to follow-up on intake. Breakfast tray was visualized at bedside, but no meal ticket available on tray. Unsure what pt consumed off of tray. Lunch tray at bedside virtually untouched. Pt did have an ensure at bedside ~50% consumed.  Pt reports ongoing nausea but no vomiting. Per RN, pt is hesitant to attempt eating due to nausea. Even when offered to be premedicated prior to intake.  Attempted to call pt's husband to inquire about intake since last night. No answer on home phone and cell phone did not ring. Will attempt to meet with him at bedside tomorrow.   Diet: Regular Supplements: Ensure Enlive TID (350 kcal and 20g of protein/ carton), Snacks TID  2/6 Dinner: Unsure of intake. No documented amounts on meal ticket. 2/7 Breakfast: No meal ticket available 2/7 Lunch: 0% Supplements: 1 ensure plus high protein ~50% consumed  Unable to estimated intake over the last 24 hours due to limited data.   Nutrition Dx:  Severe Malnutrition (in the context of acute illness) related to nausea, decreased appetite as evidenced by moderate muscle depletion, percent weight loss (10.7% x 3 months).  GOAL:  Patient will meet greater than or equal to 90% of their needs  INTERVENTION:  Continue regular diet as ordered, encourage PO intake Snacks TID between meals Ensure Enlive po TID, each supplement provides 350 kcal and 20 grams of protein. continue 48-hour calorie count to determine if pt is meeting nutrition needs orally.   Ranell Patrick, RD, LDN Clinical Dietitian RD pager # available in [place]  After hours/weekend pager # available in [facility]

## 2021-05-03 NOTE — Progress Notes (Signed)
PROGRESS NOTE    Leah Pruitt  RCV:[id number] DOB: [birth year] DOA: 04/28/2021 PCP: Emmaline Kluver, MD   Brief Narrative:  Leah Pruitt is a 76 y.o. female with past medical history of hypertension, diabetes mellitus type 2, GERD, and pancreatic mass s/p exploratory laparotomy with gastrojejunostomy tube placement on 1/13 by Dr. Zenia Resides for duodenal obstruction presented with complaints of being unable to tolerate oral intake due to nausea, vomiting, abdominal distention, and pain.  Previously she notes she had been on TPN, but had discomfort from the PICC line in her arm and states that it made her blood sugars elevated.  Furthermore, she does not want to nasogastric tube placed either.  She would like the [place] tube that was placed to be able to be used.  Patient was admitted under general surgery and hospitalist were consulted for medical comanagement of hypertension and diabetes.  Assessment & Plan:   Principal Problem:   Small bowel obstruction secondary to pancreatic cancer Active Problems:   Essential (primary) hypertension   Gastro-esophageal reflux disease without esophagitis   Type 2 diabetes mellitus without complication (HCC)   Protein-calorie malnutrition, severe   Pancreatic cancer (HCC)   Leukocytosis   Hyponatremia  Nausea/vomiting/history of ex laparotomy, biopsy of pancreatic mass, GJ bypass with placement of GJ tube: Management per primary team General surgery.  Type 2 diabetes mellitus: Recent hemoglobin A1c about a month ago was 6.4.  She is not taking any medications or insulin at home for this.  She was running low blood sugar due to decreased p.o. intake, started on dextrose/normal saline at 100 cc/h, eventually fluids were stopped by general surgery and she was advanced to regular diet and her blood sugars fairly controlled on SSI only now.  Hypertension: Hypertension is mentioned in the H&P.  I am not really sure if she has the history are not as she does not  seem to be on any medications and yet her blood pressures controlled.  We will watch for another day.  AKI/poor p.o. intake/dehydration: Resolved.  Nutrition consulted.  DVT prophylaxis: enoxaparin (LOVENOX) injection 40 mg Start: 05/01/21 2200 Place and maintain sequential compression device Start: 04/28/21 1555 SCDs Start: 04/28/21 1542   Code Status: Full Code  Family Communication: Husband present at bedside.  Plan of care discussed with husband  Status is: Inpatient Remains inpatient appropriate because: Per primary service.   Estimated body mass index is 21.16 kg/m as calculated from the following:   Height as of this encounter: 5\' 1"  (1.549 m).   Weight as of this encounter: 50.8 kg.    Nutritional Assessment: Body mass index is 21.16 kg/m.Marland Kitchen Seen by dietician.  I agree with the assessment and plan as outlined below: Nutrition Status: Nutrition Problem: Severe Malnutrition (in the context of acute illness) Etiology: nausea, decreased appetite Signs/Symptoms: moderate muscle depletion, percent weight loss (10.7% x 3 months) Percent weight loss: 10.7 % Interventions: Refer to RD note for recommendations  . Skin Assessment: I have examined the patient's skin and I agree with the wound assessment as performed by the wound care RN as outlined below:    Consultants:  TRH  Procedures:  None  Antimicrobials:  Anti-infectives (From admission, onward)    None         Subjective: Seen and examined.  Still complains of abdominal pain and intermittent nausea. Objective: Vitals:   05/02/21 1610 05/02/21 2100 05/03/21 0546 05/03/21 0728  BP: 107/75 115/69 130/81 120/73  Pulse: (!) 109  (!) 109 (!) 106  Resp: 18 20 19 17   Temp: 98.2 F (36.8 C) 98.7 F (37.1 C) 98.7 F (37.1 C) 98 F (36.7 C)  TempSrc: Oral Oral Oral Oral  SpO2: 96% 97% 97% 96%  Weight:      Height:        Intake/Output Summary (Last 24 hours) at 05/03/2021 1255 Last data filed at 05/03/2021  1000 Gross per 24 hour  Intake 240 ml  Output 200 ml  Net 40 ml    Filed Weights   04/28/21 1255  Weight: 50.8 kg    Examination:  General exam: Appears calm and comfortable  Respiratory system: Clear to auscultation. Respiratory effort normal. Cardiovascular system: S1 & S2 heard, RRR. No JVD, murmurs, rubs, gallops or clicks. No pedal edema. Gastrointestinal system: Abdomen is nondistended, soft and moderate to severe generalized tenderness, no organomegaly or masses felt. Normal bowel sounds heard. Central nervous system: Alert and oriented. No focal neurological deficits. Extremities: Symmetric 5 x 5 power. Skin: No rashes, lesions or ulcers.  Psychiatry: Judgement and insight appear normal. Mood & affect appropriate.    Data Reviewed: I have personally reviewed following labs and imaging studies  CBC: Recent Labs  Lab 04/28/21 1305 04/29/21 0126  WBC 12.4* 11.0*  NEUTROABS 11.5*  --   HGB 11.4* 9.7*  HCT 35.1* 30.2*  MCV 88.6 88.3  PLT 657* 504*    Basic Metabolic Panel: Recent Labs  Lab 04/28/21 1305 04/29/21 0126 04/30/21 0809 05/01/21 0059 05/02/21 0118  NA 130* 130* 133* 133* 129*  K 4.6 4.6 4.3 3.5 3.7  CL 92* 99 104 103 100  CO2 25 22 17* 19* 23  GLUCOSE 176* 123* 78 131* 108*  BUN 20 20 13 11 9   CREATININE 1.45* 1.26* 1.02* 0.97 0.92  CALCIUM 10.0 8.9 8.8* 8.6* 8.5*  MG  --  2.1  --   --   --   PHOS  --  4.4  --   --   --     GFR: Estimated Creatinine Clearance: 40.5 mL/min (by C-G formula based on SCr of 0.92 mg/dL). Liver Function Tests: Recent Labs  Lab 04/28/21 1305  AST 18  ALT 10  ALKPHOS 104  BILITOT 0.5  PROT 7.6  ALBUMIN 4.1    Recent Labs  Lab 04/28/21 1305  LIPASE 21    No results for input(s): AMMONIA in the last 168 hours. Coagulation Profile: Recent Labs  Lab 04/28/21 1330  INR 1.1    Cardiac Enzymes: No results for input(s): CKTOTAL, CKMB, CKMBINDEX, TROPONINI in the last 168 hours. BNP (last 3  results) No results for input(s): PROBNP in the last 8760 hours. HbA1C: No results for input(s): HGBA1C in the last 72 hours.  CBG: Recent Labs  Lab 05/02/21 1214 05/02/21 1612 05/02/21 2203 05/03/21 0729 05/03/21 1144  GLUCAP 117* 101* 109* 135* 106*    Lipid Profile: No results for input(s): CHOL, HDL, LDLCALC, TRIG, CHOLHDL, LDLDIRECT in the last 72 hours. Thyroid Function Tests: No results for input(s): TSH, T4TOTAL, FREET4, T3FREE, THYROIDAB in the last 72 hours. Anemia Panel: No results for input(s): VITAMINB12, FOLATE, FERRITIN, TIBC, IRON, RETICCTPCT in the last 72 hours. Sepsis Labs: Recent Labs  Lab 04/28/21 1329 04/28/21 1500  LATICACIDVEN 1.3 1.3     Recent Results (from the past 240 hour(s))  Blood Culture (routine x 2)     Status: None   Collection Time: 04/28/21 12:38 PM   Specimen: BLOOD  Result Value Ref Range Status   Specimen  Description BLOOD LEFT ANTECUBITAL  Final   Special Requests   Final    BOTTLES DRAWN AEROBIC AND ANAEROBIC Blood Culture results may not be optimal due to an inadequate volume of blood received in culture bottles   Culture   Final    NO GROWTH 5 DAYS Performed at [facility], [place] [address] [place], [place] [address]    Report Status 05/03/2021 FINAL  Final  Resp Panel by RT-PCR (Flu A&B, Covid) Nasopharyngeal Swab     Status: None   Collection Time: 04/28/21 12:38 PM   Specimen: Nasopharyngeal Swab; Nasopharyngeal(NP) swabs in vial transport medium  Result Value Ref Range Status   SARS Coronavirus 2 by RT PCR NEGATIVE NEGATIVE Final    Comment: (NOTE) SARS-CoV-2 target nucleic acids are NOT DETECTED.  The SARS-CoV-2 RNA is generally detectable in upper respiratory specimens during the acute phase of infection. The lowest concentration of SARS-CoV-2 viral copies this assay can detect is 138 copies/mL. A negative result does not preclude SARS-Cov-2 infection and should not be used as the sole basis for treatment  or other patient management decisions. A negative result may occur with  improper specimen collection/handling, submission of specimen other than nasopharyngeal swab, presence of viral mutation(s) within the areas targeted by this assay, and inadequate number of viral copies(<138 copies/mL). A negative result must be combined with clinical observations, patient history, and epidemiological information. The expected result is Negative.  Fact Sheet for Patients:  EntrepreneurPulse.com.au  Fact Sheet for Healthcare Providers:  IncredibleEmployment.be  This test is no t yet approved or cleared by the [place] FDA and  has been authorized for detection and/or diagnosis of SARS-CoV-2 by FDA under an Emergency Use Authorization (EUA). This EUA will remain  in effect (meaning this test can be used) for the duration of the COVID-19 declaration under Section 564(b)(1) of the Act, 21 U.S.C.section 360bbb-3(b)(1), unless the authorization is terminated  or revoked sooner.       Influenza A by PCR NEGATIVE NEGATIVE Final   Influenza B by PCR NEGATIVE NEGATIVE Final    Comment: (NOTE) The Xpert Xpress SARS-CoV-2/FLU/RSV plus assay is intended as an aid in the diagnosis of influenza from Nasopharyngeal swab specimens and should not be used as a sole basis for treatment. Nasal washings and aspirates are unacceptable for Xpert Xpress SARS-CoV-2/FLU/RSV testing.  Fact Sheet for Patients: EntrepreneurPulse.com.au  Fact Sheet for Healthcare Providers: IncredibleEmployment.be  This test is not yet approved or cleared by the [place] FDA and has been authorized for detection and/or diagnosis of SARS-CoV-2 by FDA under an Emergency Use Authorization (EUA). This EUA will remain in effect (meaning this test can be used) for the duration of the COVID-19 declaration under Section 564(b)(1) of the Act, 21 U.S.C. section  360bbb-3(b)(1), unless the authorization is terminated or revoked.  Performed at [facility], [place] [address] [place], [place] [address]   Blood Culture (routine x 2)     Status: None   Collection Time: 04/28/21  1:29 PM   Specimen: BLOOD  Result Value Ref Range Status   Specimen Description BLOOD LEFT ANTECUBITAL  Final   Special Requests   Final    BOTTLES DRAWN AEROBIC AND ANAEROBIC Blood Culture adequate volume   Culture   Final    NO GROWTH 5 DAYS Performed at [facility], [place] [address], [place], [place] [address]    Report Status 05/03/2021 FINAL  Final      Radiology Studies:  No results found.  Scheduled Meds:  docusate sodium  100 mg Oral BID   enoxaparin (LOVENOX) injection  40 mg Subcutaneous Q24H   feeding supplement  237 mL Oral TID BM   insulin aspart  0-9 Units Subcutaneous TID WC   pantoprazole  40 mg Oral BID   Continuous Infusions:     LOS: 5 days   Time spent: 20 minutes  Darliss Cheney, MD [facility]  05/03/2021, 12:55 PM  Please page via [facility] [place] and do not message via secure chat for urgent patient care matters. Secure chat can be used for non urgent patient care matters.  How to contact the [facility] Attending or Consulting provider [place] or covering provider during after hours [place], for this patient?  Check the care team in [facility] and look for a) attending/consulting TRH provider listed and b) the [facility] team listed. Page or secure chat [phone]. Log into [email or web address] and use [place]'s universal password to access. If you do not have the password, please contact the hospital operator. Locate the [facility] provider you are looking for under [facility] and page to a number that you can be directly reached. If you still have difficulty reaching the provider, please page the [facility] (Director on Call) for the Hospitalists listed on amion for assistance.

## 2021-05-03 NOTE — Progress Notes (Signed)
° ° °   °  Subjective: No acute changes. Evaluated by RD, calorie count started. Ate about half of dinner last night. Reports ongoing pain. This morning she denies nausea.   Objective: Vital signs in last 24 hours: Temp:  [98 F (36.7 C)-98.7 F (37.1 C)] 98 F (36.7 C) (02/07 0728) Pulse Rate:  [106-109] 106 (02/07 0728) Resp:  [17-20] 17 (02/07 0728) BP: (107-130)/(69-81) 120/73 (02/07 0728) SpO2:  [96 %-97 %] 96 % (02/07 0728) Last BM Date: 04/30/21  Intake/Output from previous day: 02/06 0701 - 02/07 0700 In: 120 [P.O.:120] Out: 400 [Drains:400] Intake/Output this shift: No intake/output data recorded.  PE: General: resting comfortably, NAD Neuro: alert and oriented, no focal deficits Resp: normal work of breathing on room air Abdomen: soft, nondistended, nontender. Midline incision healing well. GJ tube in place, capped. Extremities: warm and well-perfused   Lab Results:  No results for input(s): WBC, HGB, HCT, PLT in the last 72 hours.  BMET Recent Labs    05/01/21 0059 05/02/21 0118  NA 133* 129*  K 3.5 3.7  CL 103 100  CO2 19* 23  GLUCOSE 131* 108*  BUN 11 9  CREATININE 0.97 0.92  CALCIUM 8.6* 8.5*   PT/INR No results for input(s): LABPROT, INR in the last 72 hours.  CMP     Component Value Date/Time   NA 129 (L) 05/02/2021 0118   NA 133 (A) 03/31/2021 0000   K 3.7 05/02/2021 0118   CL 100 05/02/2021 0118   CO2 23 05/02/2021 0118   GLUCOSE 108 (H) 05/02/2021 0118   BUN 9 05/02/2021 0118   BUN 17 03/31/2021 0000   CREATININE 0.92 05/02/2021 0118   CALCIUM 8.5 (L) 05/02/2021 0118   PROT 7.6 04/28/2021 1305   ALBUMIN 4.1 04/28/2021 1305   AST 18 04/28/2021 1305   ALT 10 04/28/2021 1305   ALKPHOS 104 04/28/2021 1305   BILITOT 0.5 04/28/2021 1305   GFRNONAA >60 05/02/2021 0118   GFRAA >60 10/11/2016 1100   Lipase     Component Value Date/Time   LIPASE 21 04/28/2021 1305       Studies/Results: No results  found.  Anti-infectives: Anti-infectives (From admission, onward)    None        Assessment/Plan 76 yo female with a locally advanced cancer of the body of the pancreas causing duodenal obstruction and left hydronephrosis. Now 3 weeks s/p gastrojejunal bypass with placement of feeding GJ tube. J limb has retracted into the stomach. She does not want to have further procedures to have her feeding tube replaced. - Continue regular diet as tolerated, 48-hr calorie count in progress - Vent G tube prn. - Creatinine normal, continue to monitor. - VTE: lovenox, SCDs - Ambulate TID - Dispo: inpatient, med-surg floor. If unable to meet nutritional needs with oral intake based on calorie count, will need to either replace the feeding tube or start TPN.     LOS: 5 days    Michaelle Birks, MD [facility] Surgery General, Hepatobiliary and Pancreatic Surgery 05/03/21 8:24 AM

## 2021-05-04 DIAGNOSIS — K56609 Unspecified intestinal obstruction, unspecified as to partial versus complete obstruction: Secondary | ICD-10-CM | POA: Diagnosis not present

## 2021-05-04 LAB — GLUCOSE, CAPILLARY
Glucose-Capillary: 130 mg/dL — ABNORMAL HIGH (ref 70–99)
Glucose-Capillary: 150 mg/dL — ABNORMAL HIGH (ref 70–99)
Glucose-Capillary: 155 mg/dL — ABNORMAL HIGH (ref 70–99)
Glucose-Capillary: 90 mg/dL (ref 70–99)

## 2021-05-04 LAB — BASIC METABOLIC PANEL
Anion gap: 10 (ref 5–15)
BUN: 16 mg/dL (ref 8–23)
CO2: 25 mmol/L (ref 22–32)
Calcium: 8.7 mg/dL — ABNORMAL LOW (ref 8.9–10.3)
Chloride: 96 mmol/L — ABNORMAL LOW (ref 98–111)
Creatinine, Ser: 0.88 mg/dL (ref 0.44–1.00)
GFR, Estimated: >60 mL/min (ref >60)
Glucose, Bld: 125 mg/dL — ABNORMAL HIGH (ref 70–99)
Potassium: 4.1 mmol/L (ref 3.5–5.1)
Sodium: 131 mmol/L — ABNORMAL LOW (ref 135–145)

## 2021-05-04 MED ORDER — PANTOPRAZOLE SODIUM 40 MG IV SOLR
40.0000 mg | Freq: Two times a day (BID) | INTRAVENOUS | Status: DC
  Administered 2021-05-04 – 2021-05-06 (×6): 40 mg via INTRAVENOUS
  Filled 2021-05-04 (×7): qty 10

## 2021-05-04 NOTE — Progress Notes (Addendum)
Patient breakfast 05/04/21  Patient ate 1/2 apple 1 strawberry ensure  Half a cup of milk with cheerios (1/2 container) 1/2 pancake

## 2021-05-04 NOTE — Progress Notes (Signed)
° ° °   °  Subjective: No acute changes. Calorie count so far shows minimal PO intake.   Objective: Vital signs in last 24 hours: Temp:  [98.2 F (36.8 C)-98.8 F (37.1 C)] 98.8 F (37.1 C) (02/08 0722) Pulse Rate:  [105-109] 105 (02/08 0722) Resp:  [16-18] 16 (02/08 0722) BP: (107-131)/(61-82) 108/61 (02/08 0722) SpO2:  [86 %-98 %] 96 % (02/08 0722) Last BM Date: 05/02/21  Intake/Output from previous day: 02/07 0701 - 02/08 0700 In: 240 [P.O.:240] Out: -  Intake/Output this shift: No intake/output data recorded.  PE: General: resting comfortably, NAD Neuro: alert and oriented, no focal deficits Resp: normal work of breathing on room air Abdomen: soft, nondistended, nontender. Midline incision healing well. GJ tube in place, capped. Extremities: warm and well-perfused   Lab Results:  No results for input(s): WBC, HGB, HCT, PLT in the last 72 hours.  BMET Recent Labs    05/02/21 0118 05/04/21 0357  NA 129* 131*  K 3.7 4.1  CL 100 96*  CO2 23 25  GLUCOSE 108* 125*  BUN 9 16  CREATININE 0.92 0.88  CALCIUM 8.5* 8.7*   PT/INR No results for input(s): LABPROT, INR in the last 72 hours.  CMP     Component Value Date/Time   NA 131 (L) 05/04/2021 0357   NA 133 (A) 03/31/2021 0000   K 4.1 05/04/2021 0357   CL 96 (L) 05/04/2021 0357   CO2 25 05/04/2021 0357   GLUCOSE 125 (H) 05/04/2021 0357   BUN 16 05/04/2021 0357   BUN 17 03/31/2021 0000   CREATININE 0.88 05/04/2021 0357   CALCIUM 8.7 (L) 05/04/2021 0357   PROT 7.6 04/28/2021 1305   ALBUMIN 4.1 04/28/2021 1305   AST 18 04/28/2021 1305   ALT 10 04/28/2021 1305   ALKPHOS 104 04/28/2021 1305   BILITOT 0.5 04/28/2021 1305   GFRNONAA >60 05/04/2021 0357   GFRAA >60 10/11/2016 1100   Lipase     Component Value Date/Time   LIPASE 21 04/28/2021 1305       Studies/Results: No results found.  Anti-infectives: Anti-infectives (From admission, onward)    None        Assessment/Plan 75 yo female  with a locally advanced cancer of the body of the pancreas causing duodenal obstruction and left hydronephrosis. Now 3 weeks s/p gastrojejunal bypass with placement of feeding GJ tube. J limb has retracted into the stomach. She does not want to have further procedures to have her feeding tube replaced. - Continue regular diet as tolerated, 48-hr calorie count in progress. Discussed with RD yesterday afternoon - so far patient's intake is very minimal and not adequate to meet caloric needs. I discussed this with patient's husband this morning. Her options at this point are to either start TPN, which she would continue at home, or to allow [place] to attempt to reposition her J tube so that she can get small bowel feeds. Husband will discuss with patient today and she will make a decision. - Vent G tube prn. - Creatinine normal, continue to monitor. - VTE: lovenox, SCDs - Ambulate TID - Dispo: inpatient, med-surg floor.     LOS: 6 days    Michaelle Birks, MD [facility] Surgery General, Hepatobiliary and Pancreatic Surgery 05/04/21 8:13 AM

## 2021-05-04 NOTE — Progress Notes (Signed)
PROGRESS NOTE  Livana Yerian GXQ:[id number] DOB: October 02, 1945 DOA: 04/28/2021 PCP: Emmaline Kluver, MD  HPI/Recap of past 24 hours: Leah Pruitt is a 76 y.o. female with past medical history of hypertension, diabetes mellitus type 2, GERD, and pancreatic mass s/p exploratory laparotomy with gastrojejunostomy tube placement on 04/08/21 by general surgery Dr. Zenia Resides for duodenal obstruction presented with complaints of being unable to tolerate oral intake due to nausea, vomiting, abdominal distention, and diffuse abdominal pain.  Previously she notes she had been on TPN, but had discomfort from the PICC line in her arm and states that it made her blood sugars elevated.  Furthermore, she does not want the nasogastric tube placed either.  She would like the Villard tube that was placed to be able to be used.  Patient was admitted under general surgery service and hospitalist team was consulted for medical co-management of hypertension and diabetes.  Assessment/Plan: Principal Problem:   Small bowel obstruction secondary to pancreatic cancer Active Problems:   Essential (primary) hypertension   Gastro-esophageal reflux disease without esophagitis   Type 2 diabetes mellitus without complication (HCC)   Protein-calorie malnutrition, severe   Pancreatic cancer (HCC)   Leukocytosis   Hyponatremia  Intractable nausea/vomiting/history of ex laparotomy, biopsy of pancreatic mass, GJ bypass with placement of GJ tube: Management per primary team General surgery.   Prediabetes with hyperglycemia:  Recent hemoglobin A1c about a month ago was 6.4.   She is not taking any medications or insulin at home for this.   She was running low blood sugar due to decreased p.o. intake, started on dextrose/normal saline at 100 cc/h, eventually fluids were stopped by general surgery and she was advanced to regular diet and her blood sugars fairly controlled. on SSI only now for hyperglycemia.   Questionable  hypertension:  Hypertension is mentioned in the H&P.  I am not really sure if she has the history are not as she does not seem to be on any medications and yet her blood pressures controlled.   Normotensive.   Resolved AKI, likely prerenal in the setting of dehydration from poor oral intake. Baseline creatinine appears to be 0.8 with GFR greater than 60 Presented with creatinine of 1.4 She received IV fluid Continue to encourage increase in formal protein calorie intake.  Thank you for allowing [place] to participate in the care of this patient.  We will continue to follow along with you.   DVT prophylaxis: enoxaparin (LOVENOX) injection 40 mg Start: 05/01/21 2200 Place and maintain sequential compression device Start: 04/28/21 1555 SCDs Start: 04/28/21 1542   Code Status: Full Code  Family Communication: Husband of 54 years present at bedside.     Objective: Vitals:   05/03/21 1607 05/03/21 2128 05/04/21 0452 05/04/21 0722  BP: 107/71 131/82 123/76 108/61  Pulse: (!) 108 (!) 109 (!) 109 (!) 105  Resp: 18 18 18 16   Temp: 98.2 F (36.8 C) 98.4 F (36.9 C) 98.6 F (37 C) 98.8 F (37.1 C)  TempSrc: Oral Oral Oral Oral  SpO2: (!) 86% 95% 98% 96%  Weight:      Height:       No intake or output data in the 24 hours ending 05/04/21 1520 Filed Weights   04/28/21 1255  Weight: 50.8 kg    Exam:  General: 76 y.o. year-old female well developed well nourished in no acute distress.  Alert and oriented x3. Cardiovascular: Regular rate and rhythm with no rubs or gallops.  No thyromegaly or JVD  noted.   Respiratory: Clear to auscultation with no wheezes or rales. Good inspiratory effort. Abdomen: Soft diffusely tender with palpation.  Nondistended with bowel sounds.  Feeding tube in place Musculoskeletal: No lower extremity edema. 2/4 pulses in all 4 extremities. Skin: No ulcerative lesions noted or rashes, Psychiatry: Mood is appropriate for condition and setting   Data  Reviewed: CBC: Recent Labs  Lab 04/28/21 1305 04/29/21 0126  WBC 12.4* 11.0*  NEUTROABS 11.5*  --   HGB 11.4* 9.7*  HCT 35.1* 30.2*  MCV 88.6 88.3  PLT 657* 384*   Basic Metabolic Panel: Recent Labs  Lab 04/29/21 0126 04/30/21 0809 05/01/21 0059 05/02/21 0118 05/04/21 0357  NA 130* 133* 133* 129* 131*  K 4.6 4.3 3.5 3.7 4.1  CL 99 104 103 100 96*  CO2 22 17* 19* 23 25  GLUCOSE 123* 78 131* 108* 125*  BUN 20 13 11 9 16   CREATININE 1.26* 1.02* 0.97 0.92 0.88  CALCIUM 8.9 8.8* 8.6* 8.5* 8.7*  MG 2.1  --   --   --   --   PHOS 4.4  --   --   --   --    GFR: Estimated Creatinine Clearance: 42.3 mL/min (by C-G formula based on SCr of 0.88 mg/dL). Liver Function Tests: Recent Labs  Lab 04/28/21 1305  AST 18  ALT 10  ALKPHOS 104  BILITOT 0.5  PROT 7.6  ALBUMIN 4.1   Recent Labs  Lab 04/28/21 1305  LIPASE 21   No results for input(s): AMMONIA in the last 168 hours. Coagulation Profile: Recent Labs  Lab 04/28/21 1330  INR 1.1   Cardiac Enzymes: No results for input(s): CKTOTAL, CKMB, CKMBINDEX, TROPONINI in the last 168 hours. BNP (last 3 results) No results for input(s): PROBNP in the last 8760 hours. HbA1C: No results for input(s): HGBA1C in the last 72 hours. CBG: Recent Labs  Lab 05/03/21 1144 05/03/21 1631 05/03/21 2141 05/04/21 0809 05/04/21 1114  GLUCAP 106* 118* 143* 130* 150*   Lipid Profile: No results for input(s): CHOL, HDL, LDLCALC, TRIG, CHOLHDL, LDLDIRECT in the last 72 hours. Thyroid Function Tests: No results for input(s): TSH, T4TOTAL, FREET4, T3FREE, THYROIDAB in the last 72 hours. Anemia Panel: No results for input(s): VITAMINB12, FOLATE, FERRITIN, TIBC, IRON, RETICCTPCT in the last 72 hours. Urine analysis:    Component Value Date/Time   COLORURINE COLORLESS (A) 03/22/2021 1851   APPEARANCEUR CLEAR 03/22/2021 1851   LABSPEC 1.005 03/22/2021 1851   PHURINE 9.0 (H) 03/22/2021 1851   GLUCOSEU NEGATIVE 03/22/2021 1851    HGBUR NEGATIVE 03/22/2021 1851   BILIRUBINUR NEGATIVE 03/22/2021 1851   KETONESUR NEGATIVE 03/22/2021 1851   PROTEINUR NEGATIVE 03/22/2021 1851   UROBILINOGEN 0.2 11/15/2012 2157   NITRITE NEGATIVE 03/22/2021 1851   LEUKOCYTESUR NEGATIVE 03/22/2021 1851   Sepsis Labs: @LABRCNTIP (procalcitonin:4,lacticidven:4)  ) Recent Results (from the past 240 hour(s))  Blood Culture (routine x 2)     Status: None   Collection Time: 04/28/21 12:38 PM   Specimen: BLOOD  Result Value Ref Range Status   Specimen Description BLOOD LEFT ANTECUBITAL  Final   Special Requests   Final    BOTTLES DRAWN AEROBIC AND ANAEROBIC Blood Culture results may not be optimal due to an inadequate volume of blood received in culture bottles   Culture   Final    NO GROWTH 5 DAYS Performed at [facility], [place] [address] [place], [place] [address]    Report Status 05/03/2021 FINAL  Final  Resp Panel by RT-PCR (Flu A&B, Covid) Nasopharyngeal Swab     Status: None   Collection Time: 04/28/21 12:38 PM   Specimen: Nasopharyngeal Swab; Nasopharyngeal(NP) swabs in vial transport medium  Result Value Ref Range Status   SARS Coronavirus 2 by RT PCR NEGATIVE NEGATIVE Final    Comment: (NOTE) SARS-CoV-2 target nucleic acids are NOT DETECTED.  The SARS-CoV-2 RNA is generally detectable in upper respiratory specimens during the acute phase of infection. The lowest concentration of SARS-CoV-2 viral copies this assay can detect is 138 copies/mL. A negative result does not preclude SARS-Cov-2 infection and should not be used as the sole basis for treatment or other patient management decisions. A negative result may occur with  improper specimen collection/handling, submission of specimen other than nasopharyngeal swab, presence of viral mutation(s) within the areas targeted by this assay, and inadequate number of viral copies(<138 copies/mL). A negative result must be combined with clinical observations, patient  history, and epidemiological information. The expected result is Negative.  Fact Sheet for Patients:  EntrepreneurPulse.com.au  Fact Sheet for Healthcare Providers:  IncredibleEmployment.be  This test is no t yet approved or cleared by the [place] FDA and  has been authorized for detection and/or diagnosis of SARS-CoV-2 by FDA under an Emergency Use Authorization (EUA). This EUA will remain  in effect (meaning this test can be used) for the duration of the COVID-19 declaration under Section 564(b)(1) of the Act, 21 U.S.C.section 360bbb-3(b)(1), unless the authorization is terminated  or revoked sooner.       Influenza A by PCR NEGATIVE NEGATIVE Final   Influenza B by PCR NEGATIVE NEGATIVE Final    Comment: (NOTE) The Xpert Xpress SARS-CoV-2/FLU/RSV plus assay is intended as an aid in the diagnosis of influenza from Nasopharyngeal swab specimens and should not be used as a sole basis for treatment. Nasal washings and aspirates are unacceptable for Xpert Xpress SARS-CoV-2/FLU/RSV testing.  Fact Sheet for Patients: EntrepreneurPulse.com.au  Fact Sheet for Healthcare Providers: IncredibleEmployment.be  This test is not yet approved or cleared by the [place] FDA and has been authorized for detection and/or diagnosis of SARS-CoV-2 by FDA under an Emergency Use Authorization (EUA). This EUA will remain in effect (meaning this test can be used) for the duration of the COVID-19 declaration under Section 564(b)(1) of the Act, 21 U.S.C. section 360bbb-3(b)(1), unless the authorization is terminated or revoked.  Performed at [facility], [place] [address], [place], [place] [address]   Blood Culture (routine x 2)     Status: None   Collection Time: 04/28/21  1:29 PM   Specimen: BLOOD  Result Value Ref Range Status   Specimen Description BLOOD LEFT ANTECUBITAL  Final   Special Requests   Final     BOTTLES DRAWN AEROBIC AND ANAEROBIC Blood Culture adequate volume   Culture   Final    NO GROWTH 5 DAYS Performed at [facility], [place] [address], [place], [place] [address]    Report Status 05/03/2021 FINAL  Final      Studies: No results found.  Scheduled Meds:  docusate sodium  100 mg Oral BID   enoxaparin (LOVENOX) injection  40 mg Subcutaneous Q24H   feeding supplement  237 mL Oral TID BM   insulin aspart  0-9 Units Subcutaneous TID WC   pantoprazole (PROTONIX) IV  40 mg Intravenous Q12H    Continuous Infusions:   LOS: 6 days     Kayleen Memos, MD [facility] Pager [phone]  If 7PM-7AM, please contact night-coverage [email or web address] Password [email or web address] 05/04/2021, 3:20 PM

## 2021-05-04 NOTE — TOC Progression Note (Signed)
Transition of Care [facility]) - Progression Note    Patient Details  Name: Leah Pruitt MRN: [id number] Date of Birth: 01-06-1946  Transition [facility]) CM/SW Contact  Jacalyn Lefevre Edson Snowball, RN Phone Number: 05/04/2021, 12:48 PM  Clinical Narrative:     Possible discharge home on tube feeds or TPN. NCM will continue to follow for plan. Updated Tommi Rumps with Alvis Lemmings and Rushie Nyhan with [employer]  Expected Discharge Plan: [place] Barriers to Discharge: Continued Medical Work up  Expected Discharge Plan and Services Expected Discharge Plan: [place]   Discharge Planning Services: CM Consult Post Acute Care Choice: [place] arrangements for the past 2 months: Single Family Home                 DME Arranged: N/A DME Agency: NA       HH Arranged: PT, RN HH Agency: [facility] Date [facility] Agency Contacted: 04/29/21 Time [place]: 1119 Representative spoke with at [place]: [place] Interventions    Readmission Risk Interventions Readmission Risk Prevention Plan 04/19/2021  Transportation Screening Complete  PCP or Specialist Appt within 3-5 Days Complete  HRI or [place] Complete  Social Work Consult for [place] Planning/Counseling Complete  Palliative Care Screening Not Applicable  Medication Review [employer] photographer) Complete  Some recent data might be hidden

## 2021-05-04 NOTE — Progress Notes (Addendum)
Calorie Count Note - Day 2  Pt resting in bedside chair at the time of visit, husband in room assists with interview. Talked with MD yesterday about pt's poor PO and minimal intake on the first day of calorie count. MD spoke to pt and husband this AM about the need to increase PO or to move forward with nutrition support.   Intake so far today seems improved and pt is more motivated. Taking her pain medications and nausea medications consistently which is likely helping with intake.   Talked to husband about the amount of intake pt needs to be consuming to be able to meet needs. Discussed that pt will need to eat 3 full meals and an ensure, or will need to drink 3 ensures and consume ~50% of meals. This information seemed to help husband. He states that pt was feeling overwhelmed thinking she was going to have to dramatically change her intake from baseline. Educated that ensures would add a significant amount of kcal and protein if pt could consume consistently.   Intake over the last 24-hours is inadequate to meet pt's elevated needs and would place her in a difficult position as she plans to move forward with treating her cancer. However, if pt could continue to eat the amount she is consuming at her meals over the last 24 hours and drink 3 ensure plus high proteins, she would be able to meet >90% of her estimated needs.   Diet: Regular Supplements: Ensure Enlive TID (350 kcal and 20g of protein/ carton), Snacks TID  2/7 - Dinner: 163 kcal, 9g of protein 2/8 - Breakfast: 347 kcal 7g of protein 2/8 - Lunch: 0%, tray at bedside, pt not feeling hungry at this time. States she will consume her ensure later Supplements: 1 ensure plus high protein, 350 kcal, 20g of protein  Total intake over the past 24 hours: ~860 kcal (54% of minimum estimated needs)  ~36 protein (45% of minimum estimated needs)  Nutrition Dx:  Severe Malnutrition (in the context of acute illness) related to nausea, decreased  appetite as evidenced by moderate muscle depletion, percent weight loss (10.7% x 3 months).  GOAL:  Patient will meet greater than or equal to 90% of their needs  INTERVENTION:  Continue regular diet as ordered, encourage PO intake Snacks TID between meals Ensure Enlive po TID, each supplement provides 350 kcal and 20 grams of protein.  Ranell Patrick, RD, LDN Clinical Dietitian RD pager # available in [place]  After hours/weekend pager # available in [facility]

## 2021-05-05 ENCOUNTER — Inpatient Hospital Stay (HOSPITAL_COMMUNITY): Payer: Medicare Other

## 2021-05-05 ENCOUNTER — Ambulatory Visit: Payer: Medicare Other | Admitting: Oncology

## 2021-05-05 ENCOUNTER — Other Ambulatory Visit: Payer: Medicare Other

## 2021-05-05 ENCOUNTER — Encounter (HOSPITAL_COMMUNITY): Payer: Self-pay | Admitting: Surgery

## 2021-05-05 DIAGNOSIS — K56609 Unspecified intestinal obstruction, unspecified as to partial versus complete obstruction: Secondary | ICD-10-CM | POA: Diagnosis not present

## 2021-05-05 HISTORY — PX: IR GJ TUBE CHANGE: IMG1440

## 2021-05-05 LAB — COMPREHENSIVE METABOLIC PANEL
ALT: 13 U/L (ref 0–44)
AST: 35 U/L (ref 15–41)
Albumin: 3.2 g/dL — ABNORMAL LOW (ref 3.5–5.0)
Alkaline Phosphatase: 100 U/L (ref 38–126)
Anion gap: 10 (ref 5–15)
BUN: 23 mg/dL (ref 8–23)
CO2: 24 mmol/L (ref 22–32)
Calcium: 8.4 mg/dL — ABNORMAL LOW (ref 8.9–10.3)
Chloride: 95 mmol/L — ABNORMAL LOW (ref 98–111)
Creatinine, Ser: 0.96 mg/dL (ref 0.44–1.00)
GFR, Estimated: >60 mL/min (ref >60)
Glucose, Bld: 136 mg/dL — ABNORMAL HIGH (ref 70–99)
Potassium: 4.8 mmol/L (ref 3.5–5.1)
Sodium: 129 mmol/L — ABNORMAL LOW (ref 135–145)
Total Bilirubin: 1.2 mg/dL (ref 0.3–1.2)
Total Protein: 5.3 g/dL — ABNORMAL LOW (ref 6.5–8.1)

## 2021-05-05 LAB — LACTIC ACID, PLASMA: Lactic Acid, Venous: 0.8 mmol/L (ref 0.5–1.9)

## 2021-05-05 LAB — CBC WITH DIFFERENTIAL/PLATELET
Abs Immature Granulocytes: 0.1 10*3/uL — ABNORMAL HIGH (ref 0.00–0.07)
Basophils Absolute: 0 10*3/uL (ref 0.0–0.1)
Basophils Relative: 0 %
Eosinophils Absolute: 0 10*3/uL (ref 0.0–0.5)
Eosinophils Relative: 0 %
HCT: 28.9 % — ABNORMAL LOW (ref 36.0–46.0)
Hemoglobin: 10 g/dL — ABNORMAL LOW (ref 12.0–15.0)
Immature Granulocytes: 1 %
Lymphocytes Relative: 3 %
Lymphs Abs: 0.5 10*3/uL — ABNORMAL LOW (ref 0.7–4.0)
MCH: 29.7 pg (ref 26.0–34.0)
MCHC: 34.6 g/dL (ref 30.0–36.0)
MCV: 85.8 fL (ref 80.0–100.0)
Monocytes Absolute: 1.1 10*3/uL — ABNORMAL HIGH (ref 0.1–1.0)
Monocytes Relative: 7 %
Neutro Abs: 13.6 10*3/uL — ABNORMAL HIGH (ref 1.7–7.7)
Neutrophils Relative %: 89 %
Platelets: 271 10*3/uL (ref 150–400)
RBC: 3.37 MIL/uL — ABNORMAL LOW (ref 3.87–5.11)
RDW: 13.9 % (ref 11.5–15.5)
WBC: 15.4 10*3/uL — ABNORMAL HIGH (ref 4.0–10.5)
nRBC: 0 % (ref 0.0–0.2)

## 2021-05-05 LAB — CREATININE, SERUM
Creatinine, Ser: 1.01 mg/dL — ABNORMAL HIGH (ref 0.44–1.00)
GFR, Estimated: 58 mL/min — ABNORMAL LOW (ref >60)

## 2021-05-05 LAB — C-REACTIVE PROTEIN: CRP: 1.7 mg/dL — ABNORMAL HIGH (ref <1.0)

## 2021-05-05 LAB — PROCALCITONIN: Procalcitonin: <0.1 ng/mL

## 2021-05-05 LAB — GLUCOSE, CAPILLARY
Glucose-Capillary: 140 mg/dL — ABNORMAL HIGH (ref 70–99)
Glucose-Capillary: 140 mg/dL — ABNORMAL HIGH (ref 70–99)
Glucose-Capillary: 92 mg/dL (ref 70–99)
Glucose-Capillary: 111 mg/dL — ABNORMAL HIGH (ref 70–99)

## 2021-05-05 LAB — TSH: TSH: 0.673 u[IU]/mL (ref 0.350–4.500)

## 2021-05-05 LAB — PHOSPHORUS: Phosphorus: 4.2 mg/dL (ref 2.5–4.6)

## 2021-05-05 LAB — MAGNESIUM: Magnesium: 1.5 mg/dL — ABNORMAL LOW (ref 1.7–2.4)

## 2021-05-05 IMAGING — XA IR REPLACE G/J TUBE W/ FLUORO
6 of 7 series · 14 of 23 positions shown · non-contrast
Comparison: CT abdomen pelvis-[DATE];

INDICATION: History of pancreatic cancer with complete obstruction of the
horizontal segment of the duodenum, post gastrojejunostomy surgical
bypass with placement of a feeding gastrojejunostomy tube.

Unfortunately, the surgically placed gastrojejunostomy jejunal lumen
is now coiled with the entirety within the gastric lumen.
Patient now presents for attempted fluoroscopic guided replacement.
EXAM:
FLUOROSCOPIC GUIDED REPLACEMENT OF GASTROJEJUNOSTOMY TUBE

[Series 1: fl (-) angio · 2 acquisitions, 3 frames shown (1 of 5)]
[im 1/2]
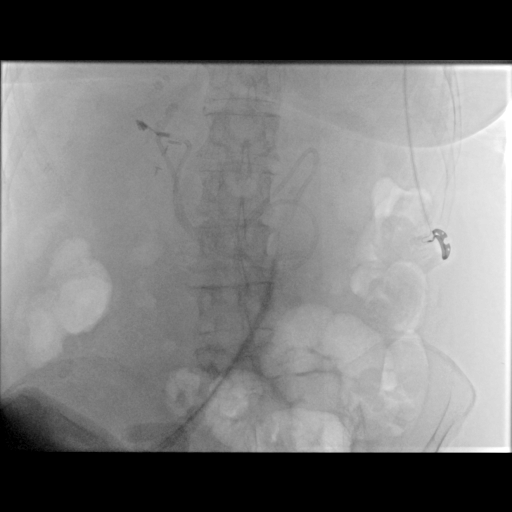
[im 1/2]
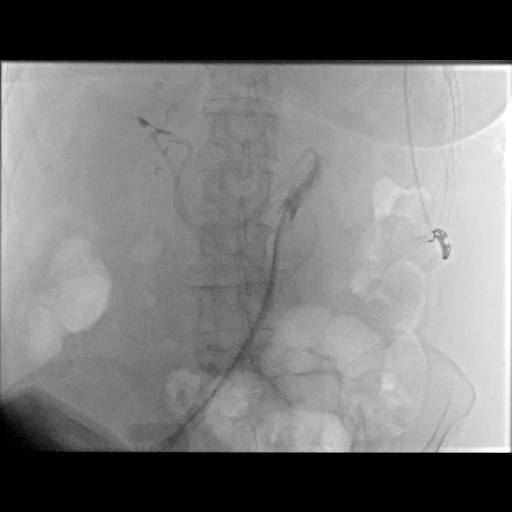
[im 2/2]
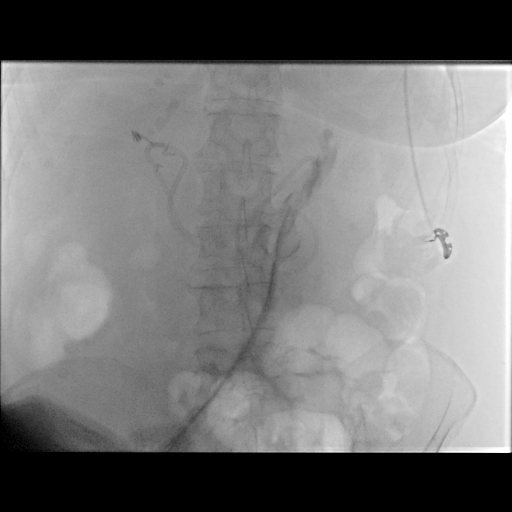

[Series 2: fl (-) angio · 2 acquisitions, 3 frames shown (2 of 5)]
[im 1/2]
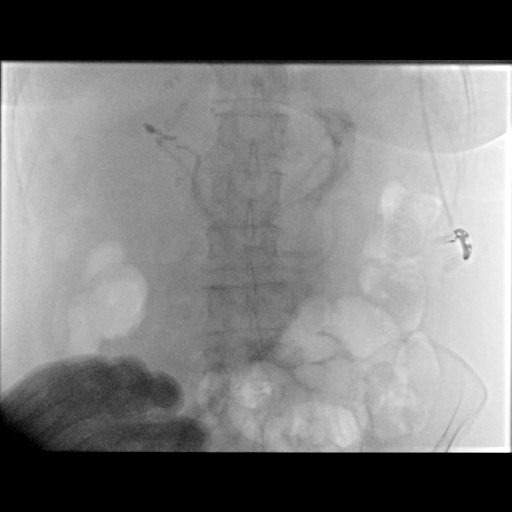
[im 1/2]
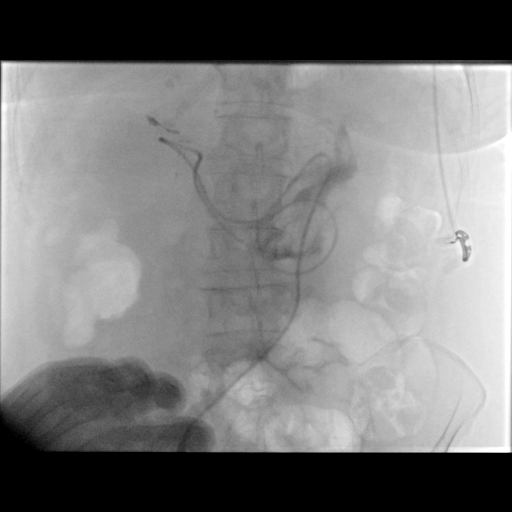
[im 2/2]
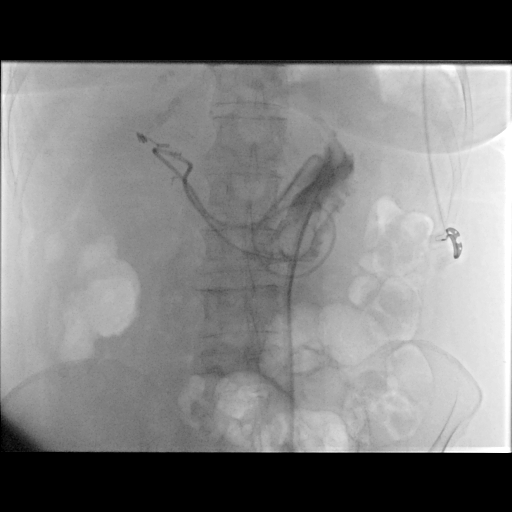

[Series 3: fl (-) angio · 1 of 1 slices shown (3 of 5)]
[im 1/1]
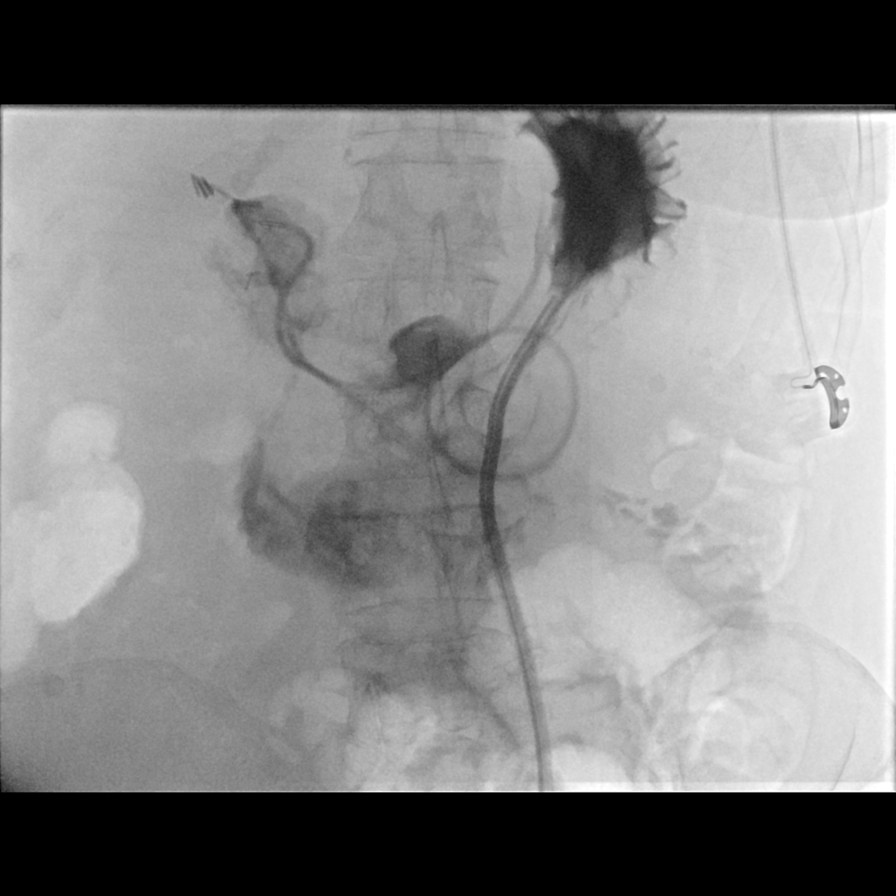

[Series 4: fl (-) angio · 2 acquisitions, 3 frames shown (4 of 5)]
[im 1/2]
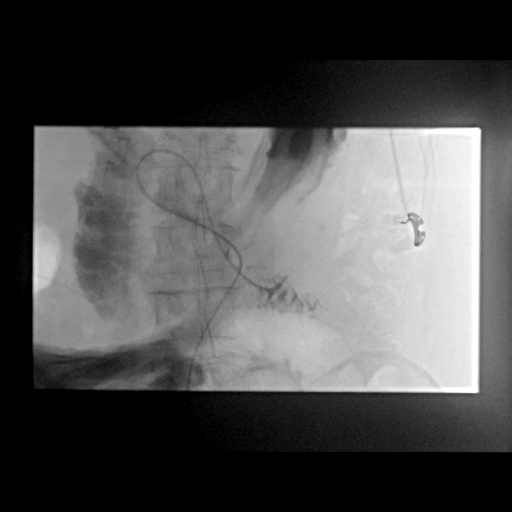
[im 1/2]
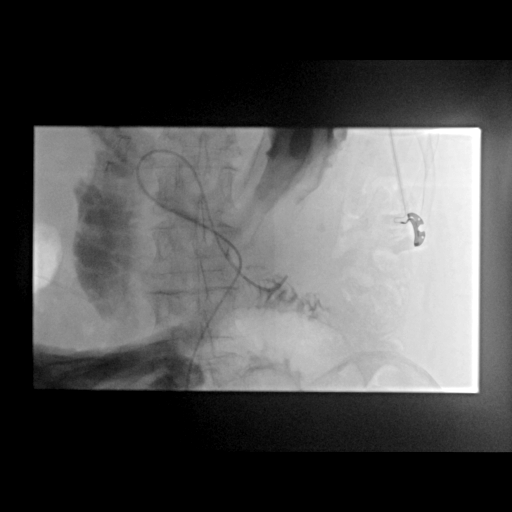
[im 2/2]
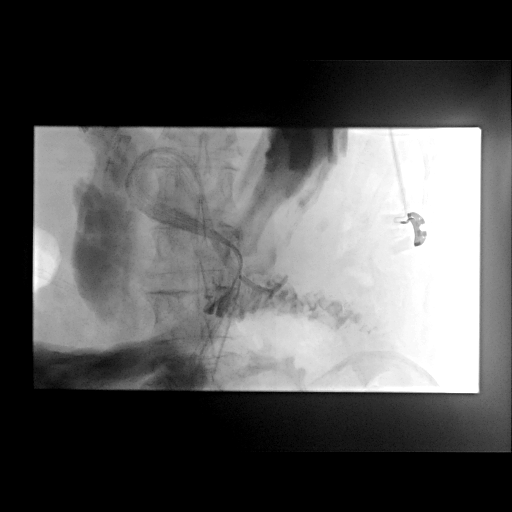

[Series 6: fl (-) angio · 2 acquisitions, 3 frames shown (5 of 5)]
[im 1/2]
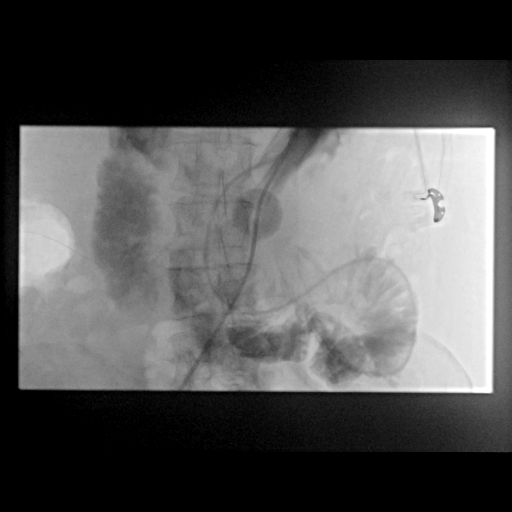
[im 1/2]
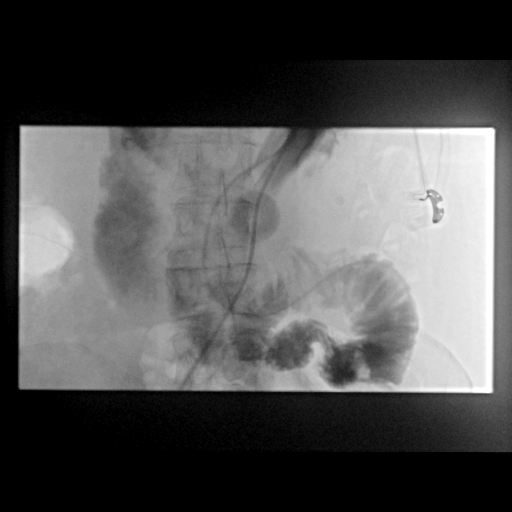
[im 1/2]
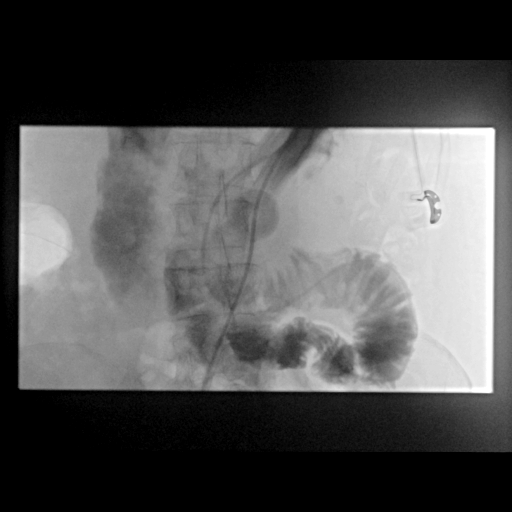

[Series 7: single · 1 of 1 slices shown]
[im 1/1]
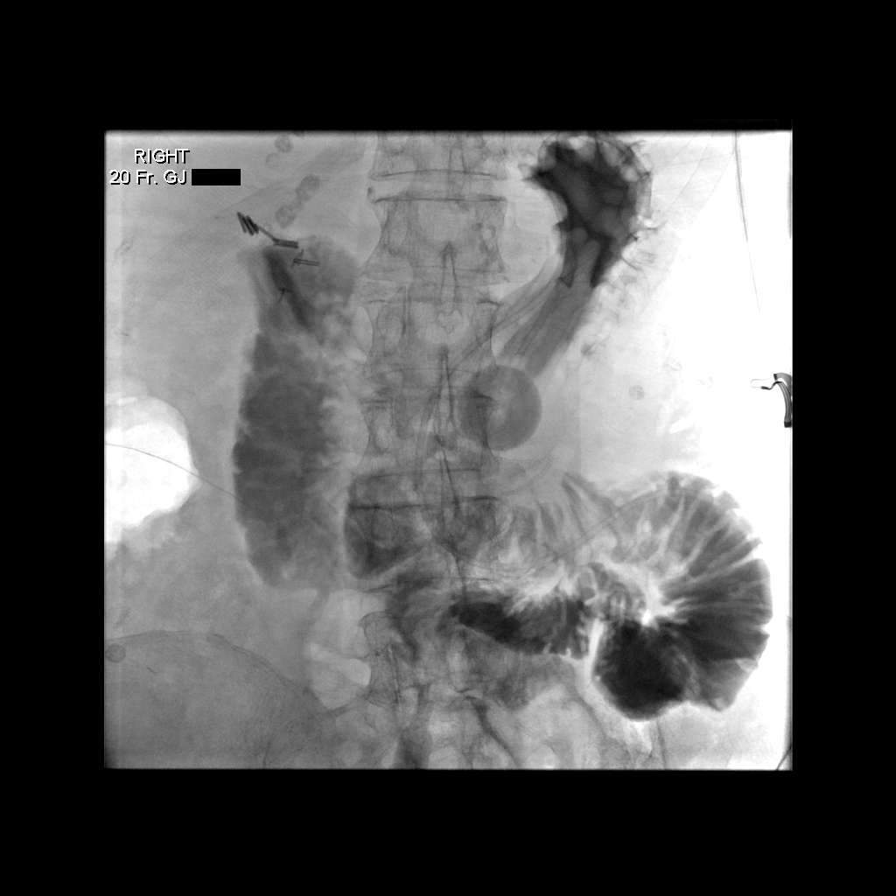

[14 of 23 positions shown; findings below may reference images not displayed]

abdominal
radiograph-earlier same day

MEDICATIONS:
None.

CONTRAST:  20mL OMNIPAQUE IOHEXOL 300 MG/ML SOLN administered into
the gastric lumen and proximal small bowel.

FLUOROSCOPY TIME:  11 minutes (36 seconds)

COMPLICATIONS:
None.

PROCEDURE:
Informed written consent was obtained from the patient after a
discussion of the risks and benefits. The upper abdomen and the
external portion of the existing gastrojejunostomy tube was prepped
and draped in the usual sterile fashion, and a sterile drape was
applied covering the operative field. Maximum barrier sterile
technique with sterile gowns and gloves were used for the procedure.
A timeout was performed prior to the initiation of the procedure.

Both lumens of the gastrojejunostomy catheter with injection
confirming both were positioned within the gastric lumen.

As such, the external portion of the surgically placed
gastrojejunostomy tube was cut and the retention balloon was
deflated.

The gastrojejunostomy tube was partially retracted and then the
jejunal lumen was cut and cannulated with a stiff Glidewire which
was cannulated within the gastric lumen

Under intermittent fluoroscopic guidance, the remainder of the GJ
tube was exchanged for a C2 catheter which was advanced through the
gastric antrum to the level of the proximal duodenum.

Attempts were made to traverse the obstructed portion of the
horizontal segment of the duodenum however this ultimately proved
unsuccessful.

Next, the C2 catheter was utilized to select the surgical
gastrojejunostomy. Contrast injection confirmed appropriate
positioning

Next, over a regular glidewire, the C2 catheter was exchanged for a
20 French balloon retention gastrojejunostomy catheter with tip
ultimately terminating within the proximal/mid small bowel though
there is some redundant jejunal lumen within the gastric fundus. The
retention balloon was insufflated with approximately 10 cc of saline
and dilute contrast. The external disc was cinched.

Contrast injection confirmed appropriate positioning and
functionality of both the gastric and jejunal lumens.

A dressing was applied. The patient tolerated the procedure well
without immediate postprocedural complication.
IMPRESSION: Technically successful fluoroscopic guided replacement and
repositioning 20 French balloon retention gastrojejunostomy catheter
with weighted tip of the jejunal lumen terminating through the
surgical gastrojejunostomy anastomosis within the proximal/mid small
bowel though note is made of some redundant jejunal lumen coiled
within the gastric fundus.

Both lumens are ready for immediate usage.

## 2021-05-05 IMAGING — DX DG CHEST 1V PORT
1 series · 1 of 1 positions shown · non-contrast
Comparison: AP chest [DATE]

CLINICAL DATA: Tachycardic this morning. Bilateral leg and right
arm swelling.

EXAM:
PORTABLE CHEST 1 VIEW

[chest ap]
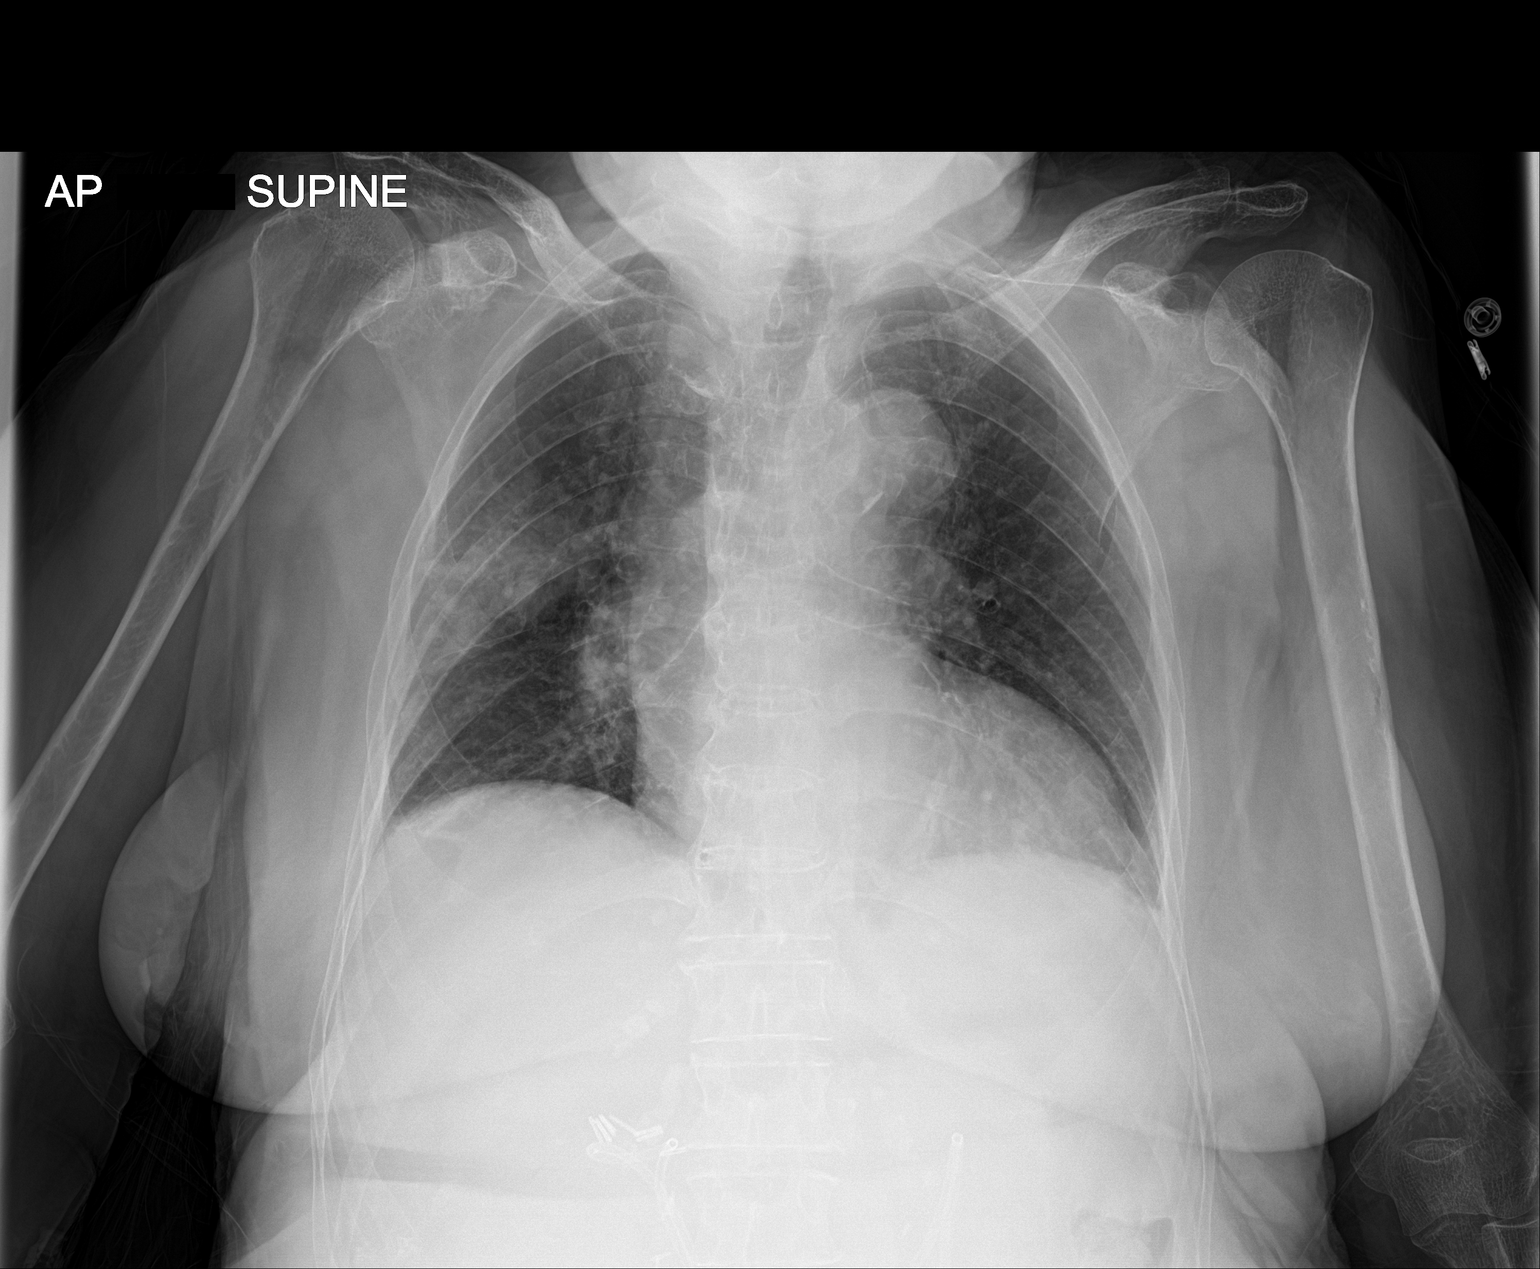

[1 of 1 positions shown; findings below may reference images not displayed]

FINDINGS: Cardiac silhouette is again at the upper limits of normal size.
Mediastinal contours are within normal limits with calcification
seen within the aortic arch. New opacity within the right lung mid
height mid to lateral transverse location. The left lung is clear.
No definite pleural effusion. No pneumothorax. Cholecystectomy
clips. Bilateral catheter tubing overlies the partially visualized
kidneys. Moderate multilevel degenerative disc changes.
IMPRESSION: New right mid lung heterogeneous airspace opacity concerning for
pneumonia.

## 2021-05-05 IMAGING — DX DG ABD PORTABLE 1V
1 series · 1 of 1 positions shown · non-contrast
Comparison: CT abdomen and pelvis and KUB [DATE]

CLINICAL DATA: Severe abdominal pain, left-greater-than-right.
Decreased urine output. Peg tube is to be adjusted later today.

EXAM:
PORTABLE ABDOMEN - 1 VIEW

[abdomen kub]
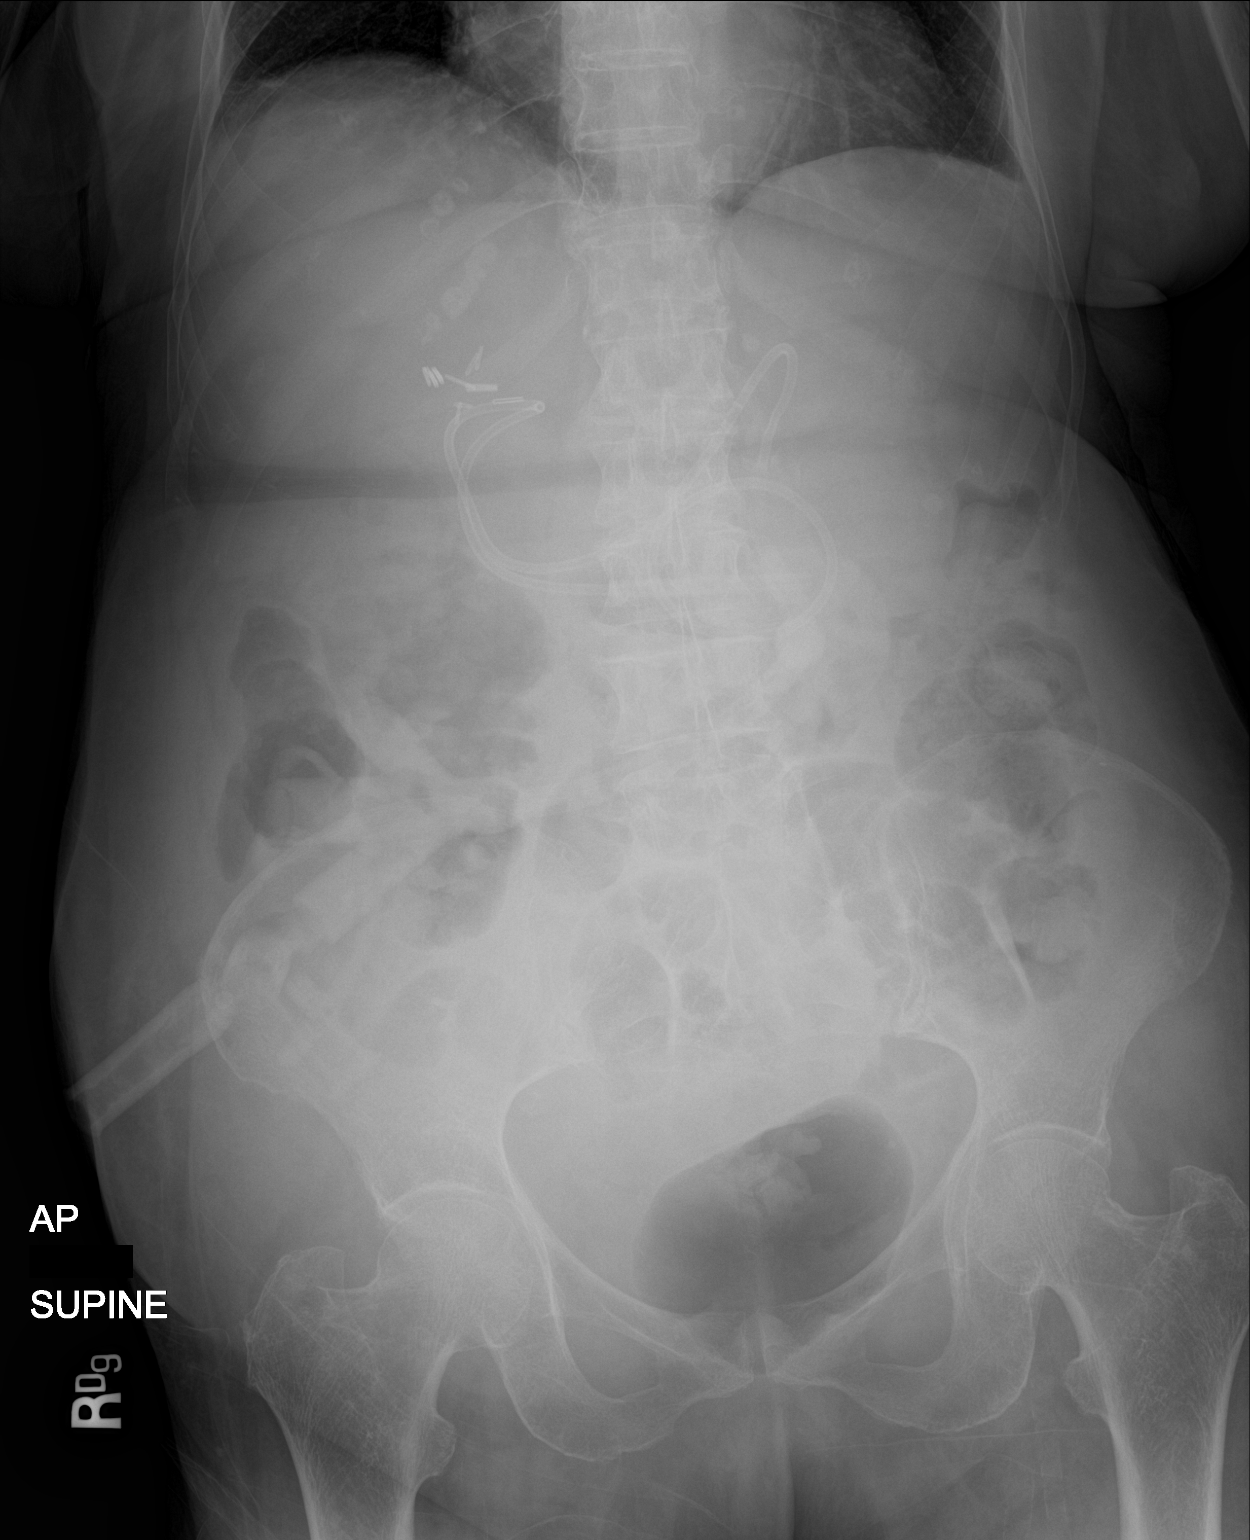

[1 of 1 positions shown; findings below may reference images not displayed]

FINDINGS: Percutaneous gastrojejunostomy tube is again noted. The distal tip
is horizontal in orientation and may overlie the third portion of
the duodenum, however this appears close to portions of the tubing
extending along likely the greater curvature of the stomach not
exclude that the tip could be also overlying the stomach.

Nonobstructive bowel-gas pattern. Air is seen within the colon and
rectum. No portal venous gas or pneumatosis. The lung bases are
clear. Mild multilevel degenerative disc changes of the thoracic
spine.
IMPRESSION: :
IMPRESSION: 1. Nonobstructed bowel-gas pattern.
2. Gastrojejunostomy tube is again noted. It is difficult to
determine whether the distal tip is in the third portion of the
duodenum versus the stomach. The tip was within the third portion of
the duodenum on recent [DATE] CT.

## 2021-05-05 MED ORDER — METOCLOPRAMIDE HCL 5 MG/ML IJ SOLN
5.0000 mg | Freq: Four times a day (QID) | INTRAMUSCULAR | Status: AC
  Administered 2021-05-05 – 2021-05-06 (×5): 5 mg via INTRAVENOUS
  Filled 2021-05-05 (×7): qty 2

## 2021-05-05 MED ORDER — LIDOCAINE VISCOUS HCL 2 % MT SOLN
OROMUCOSAL | Status: AC
  Filled 2021-05-05: qty 15

## 2021-05-05 MED ORDER — MAGNESIUM SULFATE 2 GM/50ML IV SOLN
2.0000 g | INTRAVENOUS | Status: AC
  Administered 2021-05-05: 2 g via INTRAVENOUS
  Filled 2021-05-05: qty 50

## 2021-05-05 MED ORDER — LIDOCAINE HCL 1 % IJ SOLN
INTRAMUSCULAR | Status: AC
  Administered 2021-05-05: 5 mL via SUBCUTANEOUS
  Filled 2021-05-05: qty 20

## 2021-05-05 MED ORDER — LIDOCAINE HCL 1 % IJ SOLN
INTRAMUSCULAR | Status: AC
  Filled 2021-05-05: qty 20

## 2021-05-05 MED ORDER — VITAL HIGH PROTEIN PO LIQD: 1000.0000 mL | ORAL | Status: DC

## 2021-05-05 MED ORDER — ENOXAPARIN SODIUM 40 MG/0.4ML IJ SOSY
40.0000 mg | PREFILLED_SYRINGE | INTRAMUSCULAR | Status: DC
  Administered 2021-05-06 – 2021-05-07 (×3): 40 mg via SUBCUTANEOUS
  Filled 2021-05-05 (×3): qty 0.4

## 2021-05-05 MED ORDER — LACTATED RINGERS IV SOLN: INTRAVENOUS | Status: DC

## 2021-05-05 MED ORDER — METRONIDAZOLE 500 MG/100ML IV SOLN
500.0000 mg | Freq: Two times a day (BID) | INTRAVENOUS | Status: DC
  Administered 2021-05-05 – 2021-05-06 (×3): 500 mg via INTRAVENOUS
  Filled 2021-05-05 (×3): qty 100

## 2021-05-05 MED ORDER — IOHEXOL 300 MG/ML  SOLN
100.0000 mL | Freq: Once | INTRAMUSCULAR | Status: AC | PRN
  Administered 2021-05-05: 20 mL

## 2021-05-05 MED ORDER — MIDAZOLAM HCL 2 MG/2ML IJ SOLN
INTRAMUSCULAR | Status: AC
  Filled 2021-05-05: qty 2

## 2021-05-05 MED ORDER — SODIUM CHLORIDE 0.9 % IV SOLN
2.0000 g | Freq: Two times a day (BID) | INTRAVENOUS | Status: DC
  Administered 2021-05-05 – 2021-05-06 (×3): 2 g via INTRAVENOUS
  Filled 2021-05-05 (×3): qty 2

## 2021-05-05 MED ORDER — OSMOLITE 1.5 CAL PO LIQD
1000.0000 mL | ORAL | Status: DC
  Administered 2021-05-06: 1000 mL
  Filled 2021-05-05 (×2): qty 1000

## 2021-05-05 NOTE — Sedation Documentation (Signed)
Patient is resting comfortably. 

## 2021-05-05 NOTE — Plan of Care (Signed)
°  Problem: Education: Goal: Knowledge of General Education information will improve Description: Including pain rating scale, medication(s)/side effects and non-pharmacologic comfort measures Outcome: Progressing   Problem: Clinical Measurements: Goal: Will remain free from infection Outcome: Progressing Goal: Respiratory complications will improve Outcome: Progressing Goal: Cardiovascular complication will be avoided Outcome: Progressing   Problem: Activity: Goal: Risk for activity intolerance will decrease Outcome: Progressing   Problem: Elimination: Goal: Will not experience complications related to bowel motility Outcome: Progressing Goal: Will not experience complications related to urinary retention Outcome: Progressing   Problem: Pain Managment: Goal: General experience of comfort will improve Outcome: Progressing   Problem: Safety: Goal: Ability to remain free from injury will improve Outcome: Progressing   Problem: Skin Integrity: Goal: Risk for impaired skin integrity will decrease Outcome: Progressing

## 2021-05-05 NOTE — Consult Note (Signed)
Chief Complaint: Patient was seen in consultation today for [place] tube manipulation  Chief Complaint  Patient presents with   Abdominal Pain   at the request of Michaelle Birks  Referring Physician(s): Michaelle Birks  Supervising Physician: Sandi Mariscal  Patient Status: [facility] - In-pt  History of Present Illness: Leah Pruitt is a 76 y.o. female with PMHs of HTN, GERD, DM, pancreatic mass s/p exploratory laparotomy with gastrojejunostomy tube placement on 1/13 by Dr. Zenia Resides for duodenal obstruction who presented to ED on 04/28/21 with complaints of being unable to tolerate oral intake due to nausea, vomiting, abdominal distention, and pain.  Patient continue to show symptoms of gastric outlet obstruction with PO intolerance, CT and Xray showed the J limb has possibly retracted to stomach. Consult received from general surgery team for [place] tube evaluation and possible manipulation.  The case was reviewed and approved for image guided GJ tube manipulation with possible exchange by Dr. Pascal Lux.   Patient laying in bed, not in acute distress. Family member at bedside.  Family member declined interpreter, he provided interpretation.  Reports that she was told by her surgeon that she will 'put under sleep' for the procedure.  Informed the patient and family member that [place] tube exchange is normally done with no sedation, but she will receive Versed to minimize anxiety.  Patient states that she is very anxious for the procedure but she will give it a try, but she will say 'no" when it becomes too much and she requests that we stop the procedure.  Informed the IR team. Patient reports abdominal pain and nausea.  Denise headache, fever, chills, shortness of breath, cough, chest pain,  and bleeding.   Past Medical History:  Diagnosis Date   Appendicitis, acute 11/19/2012   Arrhythmia    Arthritis    Back pain    Cardiac arrhythmia    Colles' fracture of right radius, init for clos fx 07/09/2017    Diabetes ([place])    Diverticulosis    FH: cholecystectomy    Gastroesophageal reflux disease    GERD (gastroesophageal reflux disease) 11/19/2012   Headache    HTN (hypertension)    Lipoma    Scalp   Migraines    Osteoporosis    Pain in right wrist 07/09/2017   Unspecified essential hypertension 11/19/2012    Past Surgical History:  Procedure Laterality Date   CESAREAN SECTION     CHOLECYSTECTOMY     GASTROJEJUNOSTOMY N/A 04/08/2021   Procedure: OPEN GASTROJEJUNOSTOMY BYPASS;  Surgeon: Dwan Bolt, MD;  Location: [facility];  Service: General;  Laterality: N/A;   LAPAROSCOPIC APPENDECTOMY N/A 11/16/2012   Procedure: APPENDECTOMY LAPAROSCOPIC;  Surgeon: Imogene Burn. Georgette Dover, MD;  Location: [place];  Service: General;  Laterality: N/A;   LAPAROSCOPIC LYSIS OF ADHESIONS Bilateral 11/16/2012   Procedure: LAPAROSCOPIC LYSIS OF ADHESIONS;  Surgeon: Imogene Burn. Georgette Dover, MD;  Location: [facility];  Service: General;  Laterality: Bilateral;   TONSILLECTOMY      Allergies: Iodinated contrast media, Naratriptan, Penicillins, Iodine, Ciprofloxacin, Duragesic-100 [fentanyl], and Penicillin g  Medications: Prior to Admission medications   Medication Sig Start Date End Date Taking? Authorizing Provider  BENICAR 20 MG tablet Take 20 mg by mouth 2 (two) times daily. 11/24/20  Yes [provider]  Calcium Carbonate-Vitamin D 600-200 MG-UNIT TABS Take 1 tablet by mouth 2 (two) times daily with a meal.   Yes [provider]  CAMPHOR EX Apply 1 application topically as needed (pain).   Yes [provider]  Carboxymethylcellulose Sodium (DRY EYE RELIEF OP) Apply 1 drop to eye as needed (Dryness).   Yes [provider]  diltiazem (TIAZAC) 120 MG 24 hr capsule Do not take till you talk with your Medical doctor about her blood pressure Patient taking differently: Take 120 mg by mouth daily. 11/17/12  Yes Earnstine Regal, PA-C  GLUMETZA 500 MG 24 hr tablet Take 500 mg by mouth 3 (three)  times daily. 06/01/20  Yes [provider]  hydrOXYzine (ATARAX) 10 MG tablet Take 10 mg by mouth as needed for anxiety. 04/26/21 05/06/21 Yes [provider]  magnesium hydroxide (MILK OF MAGNESIA) 800 MG/5ML suspension Take 30 mLs by mouth daily.   Yes [provider]  meclizine (ANTIVERT) 25 MG tablet Take 25 mg by mouth daily as needed for dizziness. 08/18/20  Yes [provider]  MOVANTIK 12.5 MG TABS tablet Take 12.5 mg by mouth daily as needed (constipation). 12/31/20  Yes [provider]  nitroGLYCERIN (NITROSTAT) 0.4 MG SL tablet Place 0.4 mg under the tongue every 5 (five) minutes as needed for chest pain.   Yes [provider]  ondansetron (ZOFRAN-ODT) 4 MG disintegrating tablet Take 4 mg by mouth every 8 (eight) hours as needed for nausea or vomiting.   Yes [provider]  oxyCODONE (OXY IR/ROXICODONE) 5 MG immediate release tablet Take 1-2 tablets (5-10 mg total) by mouth every 4 (four) hours as needed for severe pain (5mg  for moderate pain, 10 mg for severe pain). 04/19/21  Yes Dwan Bolt, MD  RABEprazole (ACIPHEX) 20 MG tablet Take 20 mg by mouth 2 (two) times daily.   Yes [provider]  sucralfate (CARAFATE) 1 GM/10ML suspension Take 1 g by mouth 2 (two) times daily.   Yes [provider]  acetaminophen (TYLENOL) 325 MG tablet Take 2 tablets (650 mg total) by mouth every 6 (six) hours as needed for headache or mild pain. Patient not taking: Reported on 04/28/2021 04/19/21   Dwan Bolt, MD  pantoprazole (PROTONIX) 40 MG tablet Take 1 tablet (40 mg total) by mouth 2 (two) times daily. Patient not taking: Reported on 04/28/2021 03/02/21   Daryel November, MD  prochlorperazine (COMPAZINE) 10 MG tablet Take 1 tablet (10 mg total) by mouth every 6 (six) hours as needed for nausea or vomiting. Patient not taking: Reported on 04/08/2021 04/06/21   Rosanne Sack A, PA-C  traMADol (ULTRAM) 50 MG tablet Take  50-100 mg by mouth 3 (three) times daily as needed. Patient not taking: Reported on 04/28/2021 04/21/21   [provider]     Family History  Problem Relation Age of Onset   Stroke Mother 14       deceased   Hypertension Mother    Diabetes Mellitus I Mother    Lung cancer Father 110       deceased   Hypertension Sister    Uterine cancer Sister    Stomach cancer Brother     Social History   Socioeconomic History   Marital status: Married    Spouse name: Not on file   Number of children: 2   Years of education: college   Highest education level: Not on file  Occupational History   Occupation: housewife  Tobacco Use   Smoking status: Never   Smokeless tobacco: Never  Vaping Use   Vaping Use: Never used  Substance and Sexual Activity   Alcohol use: No   Drug use: No   Sexual activity:  Not on file  Other Topics Concern   Not on file  Social History Narrative   Lives at home with husband.   Right-handed.   Occasional use of caffeine.   Social Determinants of Health   Financial Resource Strain: Not on file  Food Insecurity: Not on file  Transportation Needs: Not on file  Physical Activity: Not on file  Stress: Not on file  Social Connections: Not on file     Review of Systems: A 12 point ROS discussed and pertinent positives are indicated in the HPI above.  All other systems are negative.  Vital Signs: BP 101/67 (BP Location: Left Arm)    Pulse (!) 114    Temp 98.2 F (36.8 C)    Resp 18    Ht 5\' 1"  (1.549 m)    Wt 112 lb (50.8 kg)    SpO2 99%    BMI 21.16 kg/m    Physical Exam Vitals reviewed.  Constitutional:      Appearance: She is well-developed.  HENT:     Head: Normocephalic and atraumatic.     Mouth/Throat:     Mouth: Mucous membranes are moist.  Cardiovascular:     Rate and Rhythm: Regular rhythm. Tachycardia present.     Heart sounds: Normal heart sounds.  Pulmonary:     Effort: Pulmonary effort is normal.     Breath sounds: Normal  breath sounds.  Abdominal:     General: Abdomen is flat.  Skin:    General: Skin is warm and dry.     Coloration: Skin is not cyanotic or jaundiced.     Comments: GJ on mid-left upper abdomen. Thick yellow discharged noted around the exit site. Skin is mildly erythematous and irritated.   Neurological:     Mental Status: She is alert and oriented to person, place, and time.  Psychiatric:        Mood and Affect: Mood is anxious.        Behavior: Behavior normal.    MD Evaluation Airway: WNL Heart: WNL Abdomen: WNL Chest/ Lungs: WNL ASA  Classification: 2 Mallampati/Airway Score: Three  Imaging: CT ABDOMEN PELVIS WO CONTRAST  Result Date: 04/28/2021 CLINICAL DATA:  Postop, abdominal pain, concern for sepsis EXAM: CT ABDOMEN AND PELVIS WITHOUT CONTRAST TECHNIQUE: Multidetector CT imaging of the abdomen and pelvis was performed following the standard protocol without IV contrast. RADIATION DOSE REDUCTION: This exam was performed according to the departmental dose-optimization program which includes automated exposure control, adjustment of the mA and/or kV according to patient size and/or use of iterative reconstruction technique. COMPARISON:  04/28/2021, 04/14/2021 FINDINGS: Lower chest: No acute pleural or parenchymal lung disease. Hepatobiliary: Gallbladder is surgically absent. Unremarkable unenhanced appearance of the liver. Pancreas: Stable pancreatic atrophy. Ill-defined 3.8 x 3.4 cm soft tissue mass centered between the pancreatic tail and distal duodenum again noted, not appreciably changed. Spleen: Unremarkable unenhanced appearance. Adrenals/Urinary Tract: Stable severe left-sided hydronephrosis and left renal atrophy. This is likely due to infiltration or extrinsic mass effect upon the proximal left ureter by the ill-defined mass described above. Right kidney is stable. Bladder is decompressed, limiting its evaluation. The adrenals are unremarkable. Stomach/Bowel: A percutaneous  gastrojejunostomy tube is identified, distal tip extending through the gastrojejunostomy and residing within the proximal jejunal limb. There is moderate distention of the stomach, proximal duodenum, and efferent jejunal limb distal to the gastrojejunostomy. The distended jejunal limb extends into the lower pelvis, measuring up to 3.3 cm in diameter. Gas fluid levels are identified.  The distal jejunum and ileum are decompressed. Minimal retained stool within the colon. Vascular/Lymphatic: Aortic atherosclerosis. No enlarged abdominal or pelvic lymph nodes. Reproductive: Uterus and bilateral adnexa are unremarkable. Other: No free fluid or free intraperitoneal gas. No abdominal wall hernia. Musculoskeletal: No acute or destructive bony lesions. Reconstructed images demonstrate no additional findings. IMPRESSION: 1. Postsurgical changes from gastrojejunostomy, with percutaneous gastrojejunostomy tube extending through the gastrojejunostomy site and residing within the proximal aspect of the efferent jejunal limb. 2. Moderate distension of the stomach, proximal duodenum, and efferent jejunal limb as above, to the level of the mid lower pelvis. Findings are concerning for developing small bowel obstruction involving the efferent jejunal limb, versus postoperative ileus. 3. Stable ill-defined mass centered between the pancreatic tail and distal duodenum, with mass effect upon the duodenum and obstruction of the proximal left ureter as described above. 4. Stable severe left hydronephrosis and left renal atrophy. 5.  Aortic Atherosclerosis (ICD10-I70.0). Electronically Signed   By: Randa Ngo M.D.   On: 04/28/2021 15:45   CT ABDOMEN PELVIS WO CONTRAST  Result Date: 04/14/2021 CLINICAL DATA:  Postoperative. Recent abdominal surgery. Abdominal pain. Evaluate for fluid collection and confirm appropriate position of J-tube. EXAM: CT ABDOMEN AND PELVIS WITHOUT CONTRAST TECHNIQUE: Multidetector CT imaging of the abdomen  and pelvis was performed following the standard protocol without IV contrast. RADIATION DOSE REDUCTION: This exam was performed according to the departmental dose-optimization program which includes automated exposure control, adjustment of the mA and/or kV according to patient size and/or use of iterative reconstruction technique. COMPARISON:  KUB 04/13/2021 CT abdomen and pelvis 03/22/2021 FINDINGS: Lower chest: There is new curvilinear likely subsegmental atelectasis within the bilateral lower lobes. Heart size is mildly enlarged. Coronary artery and aortic root calcifications are again seen. No pericardial effusion. Lack of intra-articular fluid limits evaluation of the abdominal and pelvic organ parenchyma. The following findings are made within this limitation. Hepatobiliary: Smooth liver contours. No gross liver lesion is seen. Status post cholecystectomy. Pancreas: There is again a soft tissue mass in the region of the inferior aspect of the junction of the body and tail of the pancreas (axial image 35, coronal image 60), measuring up to 4.8 x 4.1 x 3.7 cm (transverse by AP by craniocaudal), unchanged from 03/22/2021 when measured in a similar manner. Previously this was associated with mass effect on the distal duodenum and proximal duodenal and stomach distention which appears resolved following reported interval gastrojejunostomy. Spleen: Normal in size without focal abnormality. Adrenals/Urinary Tract: Normal adrenals. There is again moderate to high-grade left cortical atrophy. Unchanged severe left hydronephrosis and mild right hydronephrosis. Within the limitations of lack of IV contrast, no contour deforming renal mass. The urinary bladder is grossly unremarkable. Stomach/Bowel: A catheter from ventral abdominal approach is seen with balloon within the stomach and the catheter tubing that appears to be curl within the stomach, extending from the gastric body through the duodenal bulb and with curling  backwards into the stomach body before terminating likely through an anastomosis of the stomach and proximal jejunum (coronal image 36). There are small foci of air seen just distal to the tip, likely within the proximal jejunum (coronal image 36 and axial images 45 and 46). There are scattered foci of air soft tissue swelling, and fat stranding within the left ventral abdominal wall subcutaneous fat adjacent to the course of the insertion of the gastrojejunostomy tube. Mild-to-moderate distal colonic diverticulosis. Oral contrast is seen as distal as the transverse colon. No dilated loops of  bowel to indicate bowel obstruction. Vascular/Lymphatic: No abdominal aortic aneurysm. No enlarged abdominal or pelvic lymph nodes. Reproductive: The uterus is present and grossly unremarkable. No adnexal masses. Other: Small 1.5 cm focus of air and fluid just to the right of the umbilicus within the ventral subcutaneous fat. This may represent a prior laparoscopic port. No free air or free fluid. Musculoskeletal: Grade 1 anterolisthesis of L5 on S1 with severe L5-S1 facet joint degenerative changes. IMPRESSION: 1. Redemonstration of soft tissue mass at the inferior aspect of the junction of the body and posterior horn of the pancreas suspicious for malignancy. 2. Interval gastrojejunostomy and interval placement of a gastrojejunostomy tube with the tip terminating within the jejunum just distal to the gastrojejunostomy anastomosis. Resolution of the prior stomach and proximal duodenal dilatation from obstruction by the pancreatic mass. 3. Unchanged severe left and mild right hydronephrosis. Electronically Signed   By: Yvonne Kendall M.D.   On: 04/14/2021 12:24   DG ABDOMEN PEG TUBE LOCATION  Result Date: 04/13/2021 CLINICAL DATA:  Gastrostomy tube assessment EXAM: ABDOMEN - 1 VIEW COMPARISON:  03/22/2021 CT abdomen FINDINGS: The gastrostomy tube was reportedly injected with 30 cc of Gastrografin. The radiograph shows  contrast in the tube and in the stomach, without unexpected extension around the tube. A retention balloon in the stomach forms a filling defect. There is some additional tubing projecting over the stomach. A small amount of contrast along the inferior margin of the greater curvature of the stomach could reflect early contrast extension into jejunum via the gastrojejunostomy. IMPRESSION: 1. Contrast injected through the gastric port fills the stomach. Questionable early transit contrast from the stomach into the jejunum via the gastrojejunostomy. 2. There is also some coiled tubing over the stomach well beyond the retention balloon, some of this may in part reflect the reported gastro jejunal extension. Electronically Signed   By: Van Clines M.D.   On: 04/13/2021 11:40   DG CHEST PORT 1 VIEW  Result Date: 05/05/2021 CLINICAL DATA:  Tachycardic this morning. Bilateral leg and right arm swelling. EXAM: PORTABLE CHEST 1 VIEW COMPARISON:  AP chest 04/11/2021 FINDINGS: Cardiac silhouette is again at the upper limits of normal size. Mediastinal contours are within normal limits with calcification seen within the aortic arch. New opacity within the right lung mid height mid to lateral transverse location. The left lung is clear. No definite pleural effusion. No pneumothorax. Cholecystectomy clips. Bilateral catheter tubing overlies the partially visualized kidneys. Moderate multilevel degenerative disc changes. IMPRESSION: New right mid lung heterogeneous airspace opacity concerning for pneumonia. Electronically Signed   By: Yvonne Kendall M.D.   On: 05/05/2021 10:18   DG CHEST PORT 1 VIEW  Result Date: 04/11/2021 CLINICAL DATA:  Encounter for fever. EXAM: PORTABLE CHEST 1 VIEW COMPARISON:  Portable chest 03/24/2008. FINDINGS: The lungs are expiratory. Small left pleural effusion is present and overlying coarse linear markings which could be atelectasis or atelectasis intermixed with pneumonic infiltrate.  There are atelectatic bands in the right perihilar area and in the hypoinflated right base. The remainder of the lungs appear clear. There is mild cardiomegaly, normal central vasculature, stable mediastinum with aortic tortuosity arch calcifications. Right PICC terminates in the upper right atrium. Osteopenia and thoracic spondylosis. IMPRESSION: 1. Limited expiratory study. There are asymmetric coarse markings in the left base and small left effusion. Findings could be due to atelectasis or a combination of atelectasis and pneumonia. PA and lateral study in full inspiration recommended. 2. Right PICC tip in the upper  right atrium. 3. Aortic atherosclerosis. Electronically Signed   By: Telford Nab M.D.   On: 04/11/2021 03:07   DG Abdomen Acute W/Chest  Result Date: 04/28/2021 CLINICAL DATA:  Postoperative, abdominal pain, concern for sepsis EXAM: DG ABDOMEN ACUTE WITH 1 VIEW CHEST COMPARISON:  04/13/2021 FINDINGS: Percutaneous gastrojejunostomy tube. Mildly distended air and fluid-filled loops of small bowel in the low central abdomen, measuring up to 4.0 cm in caliber. Scattered gas and stool present to the descending colon. No free air. No radiopaque calculi or other significant radiographic abnormality is seen. Heart size and mediastinal contours are within normal limits. Both lungs are clear. IMPRESSION: 1. Mildly distended air and fluid-filled loops of small bowel in the low central abdomen, measuring up to 4.0 cm in caliber. Scattered gas and stool present to the descending colon. Findings suggest postoperative ileus or developing small bowel obstruction. No free air. 2.  No acute abnormality of the lungs. Electronically Signed   By: Delanna Ahmadi M.D.   On: 04/28/2021 13:56   DG Abd Portable 1V  Result Date: 05/05/2021 CLINICAL DATA:  Severe abdominal pain, left-greater-than-right. Decreased urine output. Peg tube is to be adjusted later today. EXAM: PORTABLE ABDOMEN - 1 VIEW COMPARISON:  CT abdomen  and pelvis and KUB 04/28/2021 FINDINGS: Percutaneous gastrojejunostomy tube is again noted. The distal tip is horizontal in orientation and may overlie the third portion of the duodenum, however this appears close to portions of the tubing extending along likely the greater curvature of the stomach not exclude that the tip could be also overlying the stomach. Nonobstructive bowel-gas pattern. Air is seen within the colon and rectum. No portal venous gas or pneumatosis. The lung bases are clear. Mild multilevel degenerative disc changes of the thoracic spine. IMPRESSION:: IMPRESSION: 1. Nonobstructed bowel-gas pattern. 2. Gastrojejunostomy tube is again noted. It is difficult to determine whether the distal tip is in the third portion of the duodenum versus the stomach. The tip was within the third portion of the duodenum on recent 04/28/2021 CT. Electronically Signed   By: Yvonne Kendall M.D.   On: 05/05/2021 10:25   [place] EKG SITE RITE  Result Date: 04/07/2021 If Site Rite image not attached, placement could not be confirmed due to current cardiac rhythm.   Labs:  CBC: Recent Labs    04/18/21 0500 04/28/21 1305 04/29/21 0126 05/05/21 0951  WBC 8.1 12.4* 11.0* 15.4*  HGB 7.7* 11.4* 9.7* 10.0*  HCT 23.3* 35.1* 30.2* 28.9*  PLT 324 657* 504* 271    COAGS: Recent Labs    04/07/21 1412 04/28/21 1330  INR 1.0 1.1  APTT  --  29    BMP: Recent Labs    05/01/21 0059 05/02/21 0118 05/04/21 0357 05/05/21 0749 05/05/21 0951  NA 133* 129* 131*  --  129*  K 3.5 3.7 4.1  --  4.8  CL 103 100 96*  --  95*  CO2 19* 23 25  --  24  GLUCOSE 131* 108* 125*  --  136*  BUN 11 9 16   --  23  CALCIUM 8.6* 8.5* 8.7*  --  8.4*  CREATININE 0.97 0.92 0.88 1.01* 0.96  GFRNONAA >60 >60 >60 58* >60    LIVER FUNCTION TESTS: Recent Labs    04/14/21 0430 04/18/21 0500 04/28/21 1305 05/05/21 0951  BILITOT 0.4 <0.1* 0.5 1.2  AST 17 16 18  35  ALT 14 8 10 13   ALKPHOS 71 80 104 100  PROT 4.8* 5.0* 7.6  5.3*  ALBUMIN 2.2* 2.4* 4.1 3.2*    TUMOR MARKERS: No results for input(s): AFPTM, CEA, CA199, CHROMGRNA in the last 8760 hours.  Assessment and Plan: 76 y.o. female with gastric outlet obstruction due to pancreatic mass, s/p GJ tube placement by general surgery on 04/08/21. She is currently hospitalized due to N/V and concern for malfunctioning GJ.   IR was requested for GJ tube eval and possible manipulation.  NPO VS tachycardic HR 114 but stable  WBC trending up, 15.4 (11.0 yesterday)  Procalcitonin normal, CRP elevated 1.7  Blood cx obtained on 2/2, showed NG x2  Risks and benefits image guided gastrojejunostomy tube manipulation and possible exchange was discussed with the patient including, but not limited to bleeding, infection, peritonitis, unable to utilize existing tract and/or damage to adjacent structures.  All of the patient's questions were answered, patient is agreeable to proceed.  Consent signed and in chart.  Thank you for this interesting consult.  I greatly enjoyed meeting Josiah Pitney and look forward to participating in their care.  A copy of this report was sent to the requesting provider on this date.  Electronically Signed: Tera Mater, PA-C 05/05/2021, 2:53 PM   I spent a total of 55 Mi[place]    in face to face in clinical consultation, greater than 50% of which was counseling/coordinating care for GJ manipulation and possible exchange.   This chart was dictated using voice recognition software.  Despite best efforts to proofread,  errors can occur which can change the documentation meaning.

## 2021-05-05 NOTE — Progress Notes (Signed)
° ° °   °  Subjective: Patient still has poor po intake, feels full quickly with nausea.   Objective: Vital signs in last 24 hours: Temp:  [97.6 F (36.4 C)-99.2 F (37.3 C)] 99.2 F (37.3 C) (02/09 0700) Pulse Rate:  [104-161] 161 (02/09 0700) Resp:  [16] 16 (02/08 1659) BP: (118-124)/(88-89) 118/88 (02/09 0700) SpO2:  [93 %-96 %] 93 % (02/09 0700) Last BM Date: 05/02/21  Intake/Output from previous day: 02/08 0701 - 02/09 0700 In: -  Out: 180 [Drains:180] Intake/Output this shift: No intake/output data recorded.  PE: General: resting comfortably, NAD Neuro: alert and oriented, no focal deficits Resp: normal work of breathing on room air Abdomen: soft, nondistended, nontender. Midline incision clean and dry. GJ tube capped. Extremities: warm and well-perfused   Lab Results:  No results for input(s): WBC, HGB, HCT, PLT in the last 72 hours.  BMET Recent Labs    05/04/21 0357  NA 131*  K 4.1  CL 96*  CO2 25  GLUCOSE 125*  BUN 16  CREATININE 0.88  CALCIUM 8.7*   PT/INR No results for input(s): LABPROT, INR in the last 72 hours.  CMP     Component Value Date/Time   NA 131 (L) 05/04/2021 0357   NA 133 (A) 03/31/2021 0000   K 4.1 05/04/2021 0357   CL 96 (L) 05/04/2021 0357   CO2 25 05/04/2021 0357   GLUCOSE 125 (H) 05/04/2021 0357   BUN 16 05/04/2021 0357   BUN 17 03/31/2021 0000   CREATININE 0.88 05/04/2021 0357   CALCIUM 8.7 (L) 05/04/2021 0357   PROT 7.6 04/28/2021 1305   ALBUMIN 4.1 04/28/2021 1305   AST 18 04/28/2021 1305   ALT 10 04/28/2021 1305   ALKPHOS 104 04/28/2021 1305   BILITOT 0.5 04/28/2021 1305   GFRNONAA >60 05/04/2021 0357   GFRAA >60 10/11/2016 1100   Lipase     Component Value Date/Time   LIPASE 21 04/28/2021 1305       Studies/Results: No results found.  Anti-infectives: Anti-infectives (From admission, onward)    None        Assessment/Plan 76 yo female with a locally advanced cancer of the body of the  pancreas causing duodenal obstruction and left hydronephrosis. Now 4 weeks s/p gastrojejunal bypass with placement of feeding GJ tube. J limb has retracted into the stomach, and patient still has symptoms of gastric outlet obstruction with PO intolerance. I have had extensive discussions with her regarding nutrition, and she has initially been resistant to having any further procedures done, although she has been clear she wants to be able to have her pancreatic cancer treated. Today she is agreeable to having her feeding tube repositioned. - Will consult IR to discuss possibility of repositioning the J tube into the small bowel so that it can be used for feeds - Continue venting G tube prn - Consult palliative care today for assistance with pain management and assistance with goals of care. Patient has had significant cancer-related pain with high dilaudid requirement and minimal relief from oxycodone, likely secondary to poor absorption. - VTE: lovenox, SCDs - Ambulate TID - Dispo: inpatient, med-surg floor.     LOS: 7 days    Michaelle Birks, MD [facility] General, Hepatobiliary and Pancreatic Surgery 05/05/21 7:48 AM

## 2021-05-05 NOTE — Procedures (Signed)
Pre procedural Dx: Dysphagia, poorly functioning feeding tube. Post procedural Dx: Same  Technically successful fluoroscopic guided replacement and repositioning 20 French balloon retention gastrojejunostomy catheter with weighted tip of the jejunal lumen terminating through the surgical gastrojejunostomy anastomosis within the proximal/mid small bowel though note is made of some redundant jejunal lumen coiled within the gastric fundus.    Both lumens are ready for immediate usage.   EBL: None Complications: None immediate.  Ronny Bacon, MD Pager #: [phone]

## 2021-05-05 NOTE — Sedation Documentation (Signed)
Vital signs stable. 

## 2021-05-05 NOTE — Progress Notes (Signed)
PROGRESS NOTE  Leah Pruitt YKD:[id number] DOB: 03-Sep-1945 DOA: 04/28/2021 PCP: Emmaline Kluver, MD  HPI/Recap of past 24 hours: Leah Pruitt is a 76 y.o. female with past medical history of hypertension, diabetes mellitus type 2, GERD, and pancreatic mass s/p exploratory laparotomy with gastrojejunostomy tube placement on 04/08/21 by general surgery Dr. Zenia Pruitt for duodenal obstruction presented with complaints of being unable to tolerate oral intake due to nausea, vomiting, abdominal distention, and diffuse abdominal pain.  Previously she notes she had been on TPN, but had discomfort from the PICC line in her arm and states that it made her blood sugars elevated.  Furthermore, she does not want the nasogastric tube placed either.  She would like the [place] tube that was placed to be able to be used.  Patient was admitted under general surgery service and hospitalist team was consulted for medical co-management of hypertension and type 2 diabetes.  Hospital course complicated by severe abdominal pain, refractory to high doses of IV Dilaudid.  Palliative care team was consulted by general surgery to assist with the management of her intractable abdominal pain.  N.p.o. for possible GJ tube evaluation and possible manipulation by IR on 05/05/2021.  05/05/2021: Patient was seen at her bedside.  Her husband is present in the room.  In significant diffused abdominal pain.  Labs and x-rays obtained.  Personally reviewed chest x-ray which showed right middle lobe infiltrate concerning for pneumonia.  Lactic acid elevated, worsening leukocytosis, started Zosyn.  Will de-escalate antibiotics once symptomatology improves.  Assessment/Plan: Principal Problem:   Small bowel obstruction secondary to pancreatic cancer Active Problems:   Essential (primary) hypertension   Gastro-esophageal reflux disease without esophagitis   Type 2 diabetes mellitus without complication (HCC)   Protein-calorie malnutrition,  severe   Pancreatic cancer (HCC)   Leukocytosis   Hyponatremia  Intractable nausea/vomiting/history of ex laparotomy, biopsy of pancreatic mass, GJ bypass with placement of GJ tube:  Management per primary team General surgery. Pain management per palliative care team, consulted by primary team.  N.p.o. for possible GJ tube evaluation and possible manipulation by IR on 05/05/2021. Abdominal x-ray done on 05/05/2021, showing nonobstructive bowel gas pattern.  Gastrojejunostomy tube in place.  Sepsis secondary to right middle lobe pneumonia, possibly present on admission, with concern for aspiration Personally reviewed chest x-ray done on 05/05/2021.  It shows right middle lobe infiltrates suggestive of pneumonia. Possible aspiration from intractable vomiting, suspect present on admission. Worsening leukocytosis, tachycardia, elevated lactic acid, temperature rising Obtain blood cultures x2 peripherally Started Zosyn on 05/05/2021 Monitor fever curve and WBC De-escalate IV antibiotics once symptomatology improves  Sinus tachycardia, suspect driven by sepsis Obtain twelve-lead EKG Start gentle IV fluid hydration LR 50 cc/h x 2 days Follow blood cultures, sputum culture. Treat underlying conditions  Hypomagnesemia Serum magnesium 1.5 Repleted intravenously with 2 g IV magnesium.  Hypovolemic hyponatremia Serum sodium 129 Obtain TSH Start gentle IV fluid hydration LR 50 cc/h x 2 days.  Chronic normocytic anemia Hemoglobin uptrending 10.0 from 9.7. No overt bleeding. Continue to monitor.   Prediabetes with hyperglycemia:  Recent hemoglobin A1c about a month ago was 6.4.   She is not taking any medications or insulin at home for this.   She was running low blood sugar due to decreased p.o. intake, started on dextrose/normal saline at 100 cc/h, eventually fluids were stopped by general surgery and she was advanced to regular diet and her blood sugars fairly controlled. on SSI only now for  hyperglycemia.  Questionable hypertension:  Hypertension is mentioned in the H&P.  I am not really sure if she has the history are not as she does not seem to be on any medications and yet her blood pressures controlled.   BP is normotensive.   Resolved AKI, likely prerenal in the setting of dehydration from poor oral intake. Baseline creatinine appears to be 0.8 with GFR greater than 60 Presented with creatinine of 1.4 She received IV fluid Continue to encourage increase in formal protein calorie intake.  Thank you for allowing [place] to participate in the care of this patient we will continue to follow this patient along with you.     DVT prophylaxis: enoxaparin (LOVENOX) injection 40 mg Start: 05/01/21 2200 Place and maintain sequential compression device Start: 04/28/21 1555 SCDs Start: 04/28/21 1542   Code Status: Full Code  Family Communication: Husband of 54 years present at bedside.     Objective: Vitals:   05/04/21 1659 05/05/21 0749 05/05/21 0750 05/05/21 1330  BP:  118/88 118/88 101/67  Pulse:  (!) 125 (!) 125 (!) 114  Resp: 16 18  18   Temp:  99.2 F (37.3 C) 99.2 F (37.3 C) 98.2 F (36.8 C)  TempSrc: Oral Oral Oral   SpO2: 96% 93%  99%  Weight:      Height:        Intake/Output Summary (Last 24 hours) at 05/05/2021 1414 Last data filed at 05/04/2021 2037 Gross per 24 hour  Intake --  Output 180 ml  Net -180 ml   Filed Weights   04/28/21 1255  Weight: 50.8 kg    Exam:  General: 76 y.o. year-old female frail-appearing, uncomfortable due to severe abdominal pain.  Alert and oriented x3.   Cardiovascular: Tachycardic no rubs or gallops. Respiratory: Mild rales at bases with no wheezing noted.  Poor inspiratory effort. Abdomen: Diffusely tender.  GJ tube in place.   Musculoskeletal: No lower extremity edema bilaterally.   Skin: No ulcerative lesions noted. Psychiatry: Mood is appropriate for condition and setting. Neuro exam: Moves all 4 extremities.   Nonfocal exam.   Data Reviewed: CBC: Recent Labs  Lab 04/29/21 0126 05/05/21 0951  WBC 11.0* 15.4*  NEUTROABS  --  13.6*  HGB 9.7* 10.0*  HCT 30.2* 28.9*  MCV 88.3 85.8  PLT 504* 030   Basic Metabolic Panel: Recent Labs  Lab 04/29/21 0126 04/30/21 0809 05/01/21 0059 05/02/21 0118 05/04/21 0357 05/05/21 0749 05/05/21 0951  NA 130* 133* 133* 129* 131*  --  129*  K 4.6 4.3 3.5 3.7 4.1  --  4.8  CL 99 104 103 100 96*  --  95*  CO2 22 17* 19* 23 25  --  24  GLUCOSE 123* 78 131* 108* 125*  --  136*  BUN 20 13 11 9 16   --  23  CREATININE 1.26* 1.02* 0.97 0.92 0.88 1.01* 0.96  CALCIUM 8.9 8.8* 8.6* 8.5* 8.7*  --  8.4*  MG 2.1  --   --   --   --   --  1.5*  PHOS 4.4  --   --   --   --   --  4.2   GFR: Estimated Creatinine Clearance: 38.8 mL/min (by C-G formula based on SCr of 0.96 mg/dL). Liver Function Tests: Recent Labs  Lab 05/05/21 0951  AST 35  ALT 13  ALKPHOS 100  BILITOT 1.2  PROT 5.3*  ALBUMIN 3.2*   No results for input(s): LIPASE, AMYLASE in the last 168  hours.  No results for input(s): AMMONIA in the last 168 hours. Coagulation Profile: No results for input(s): INR, PROTIME in the last 168 hours.  Cardiac Enzymes: No results for input(s): CKTOTAL, CKMB, CKMBINDEX, TROPONINI in the last 168 hours. BNP (last 3 results) No results for input(s): PROBNP in the last 8760 hours. HbA1C: No results for input(s): HGBA1C in the last 72 hours. CBG: Recent Labs  Lab 05/04/21 1114 05/04/21 1656 05/04/21 2139 05/05/21 0800 05/05/21 1153  GLUCAP 150* 155* 90 140* 140*   Lipid Profile: No results for input(s): CHOL, HDL, LDLCALC, TRIG, CHOLHDL, LDLDIRECT in the last 72 hours. Thyroid Function Tests: No results for input(s): TSH, T4TOTAL, FREET4, T3FREE, THYROIDAB in the last 72 hours. Anemia Panel: No results for input(s): VITAMINB12, FOLATE, FERRITIN, TIBC, IRON, RETICCTPCT in the last 72 hours. Urine analysis:    Component Value Date/Time    COLORURINE COLORLESS (A) 03/22/2021 1851   APPEARANCEUR CLEAR 03/22/2021 1851   LABSPEC 1.005 03/22/2021 1851   PHURINE 9.0 (H) 03/22/2021 1851   GLUCOSEU NEGATIVE 03/22/2021 1851   HGBUR NEGATIVE 03/22/2021 1851   BILIRUBINUR NEGATIVE 03/22/2021 1851   KETONESUR NEGATIVE 03/22/2021 1851   PROTEINUR NEGATIVE 03/22/2021 1851   UROBILINOGEN 0.2 11/15/2012 2157   NITRITE NEGATIVE 03/22/2021 1851   LEUKOCYTESUR NEGATIVE 03/22/2021 1851   Sepsis Labs: @LABRCNTIP (procalcitonin:4,lacticidven:4)  ) Recent Results (from the past 240 hour(s))  Blood Culture (routine x 2)     Status: None   Collection Time: 04/28/21 12:38 PM   Specimen: BLOOD  Result Value Ref Range Status   Specimen Description BLOOD LEFT ANTECUBITAL  Final   Special Requests   Final    BOTTLES DRAWN AEROBIC AND ANAEROBIC Blood Culture results may not be optimal due to an inadequate volume of blood received in culture bottles   Culture   Final    NO GROWTH 5 DAYS Performed at [facility], [place] [address] [place], [place] [address]    Report Status 05/03/2021 FINAL  Final  Resp Panel by RT-PCR (Flu A&B, Covid) Nasopharyngeal Swab     Status: None   Collection Time: 04/28/21 12:38 PM   Specimen: Nasopharyngeal Swab; Nasopharyngeal(NP) swabs in vial transport medium  Result Value Ref Range Status   SARS Coronavirus 2 by RT PCR NEGATIVE NEGATIVE Final    Comment: (NOTE) SARS-CoV-2 target nucleic acids are NOT DETECTED.  The SARS-CoV-2 RNA is generally detectable in upper respiratory specimens during the acute phase of infection. The lowest concentration of SARS-CoV-2 viral copies this assay can detect is 138 copies/mL. A negative result does not preclude SARS-Cov-2 infection and should not be used as the sole basis for treatment or other patient management decisions. A negative result may occur with  improper specimen collection/handling, submission of specimen other than nasopharyngeal swab, presence of viral  mutation(s) within the areas targeted by this assay, and inadequate number of viral copies(<138 copies/mL). A negative result must be combined with clinical observations, patient history, and epidemiological information. The expected result is Negative.  Fact Sheet for Patients:  EntrepreneurPulse.com.au  Fact Sheet for Healthcare Providers:  IncredibleEmployment.be  This test is no t yet approved or cleared by the [place] FDA and  has been authorized for detection and/or diagnosis of SARS-CoV-2 by FDA under an Emergency Use Authorization (EUA). This EUA will remain  in effect (meaning this test can be used) for the duration of the COVID-19 declaration under Section 564(b)(1) of the Act, 21 U.S.C.section 360bbb-3(b)(1), unless the authorization is terminated  or revoked sooner.       Influenza A by PCR NEGATIVE NEGATIVE Final   Influenza B by PCR NEGATIVE NEGATIVE Final    Comment: (NOTE) The Xpert Xpress SARS-CoV-2/FLU/RSV plus assay is intended as an aid in the diagnosis of influenza from Nasopharyngeal swab specimens and should not be used as a sole basis for treatment. Nasal washings and aspirates are unacceptable for Xpert Xpress SARS-CoV-2/FLU/RSV testing.  Fact Sheet for Patients: EntrepreneurPulse.com.au  Fact Sheet for Healthcare Providers: IncredibleEmployment.be  This test is not yet approved or cleared by the [place] FDA and has been authorized for detection and/or diagnosis of SARS-CoV-2 by FDA under an Emergency Use Authorization (EUA). This EUA will remain in effect (meaning this test can be used) for the duration of the COVID-19 declaration under Section 564(b)(1) of the Act, 21 U.S.C. section 360bbb-3(b)(1), unless the authorization is terminated or revoked.  Performed at [facility], [place] [address] [place], [place] [address]   Blood Culture (routine x 2)      Status: None   Collection Time: 04/28/21  1:29 PM   Specimen: BLOOD  Result Value Ref Range Status   Specimen Description BLOOD LEFT ANTECUBITAL  Final   Special Requests   Final    BOTTLES DRAWN AEROBIC AND ANAEROBIC Blood Culture adequate volume   Culture   Final    NO GROWTH 5 DAYS Performed at [facility], [place] [address], [place], [place] [address]    Report Status 05/03/2021 FINAL  Final      Studies: DG CHEST PORT 1 VIEW  Result Date: 05/05/2021 CLINICAL DATA:  Tachycardic this morning. Bilateral leg and right arm swelling. EXAM: PORTABLE CHEST 1 VIEW COMPARISON:  AP chest 04/11/2021 FINDINGS: Cardiac silhouette is again at the upper limits of normal size. Mediastinal contours are within normal limits with calcification seen within the aortic arch. New opacity within the right lung mid height mid to lateral transverse location. The left lung is clear. No definite pleural effusion. No pneumothorax. Cholecystectomy clips. Bilateral catheter tubing overlies the partially visualized kidneys. Moderate multilevel degenerative disc changes. IMPRESSION: New right mid lung heterogeneous airspace opacity concerning for pneumonia. Electronically Signed   By: Yvonne Kendall M.D.   On: 05/05/2021 10:18   DG Abd Portable 1V  Result Date: 05/05/2021 CLINICAL DATA:  Severe abdominal pain, left-greater-than-right. Decreased urine output. Peg tube is to be adjusted later today. EXAM: PORTABLE ABDOMEN - 1 VIEW COMPARISON:  CT abdomen and pelvis and KUB 04/28/2021 FINDINGS: Percutaneous gastrojejunostomy tube is again noted. The distal tip is horizontal in orientation and may overlie the third portion of the duodenum, however this appears close to portions of the tubing extending along likely the greater curvature of the stomach not exclude that the tip could be also overlying the stomach. Nonobstructive bowel-gas pattern. Air is seen within the colon and rectum. No portal venous gas or pneumatosis. The  lung bases are clear. Mild multilevel degenerative disc changes of the thoracic spine. IMPRESSION:: IMPRESSION: 1. Nonobstructed bowel-gas pattern. 2. Gastrojejunostomy tube is again noted. It is difficult to determine whether the distal tip is in the third portion of the duodenum versus the stomach. The tip was within the third portion of the duodenum on recent 04/28/2021 CT. Electronically Signed   By: Yvonne Kendall M.D.   On: 05/05/2021 10:25    Scheduled Meds:  docusate sodium  100 mg Oral BID   enoxaparin (LOVENOX) injection  40 mg Subcutaneous Q24H  feeding supplement  237 mL Oral TID BM   insulin aspart  0-9 Units Subcutaneous TID WC   pantoprazole (PROTONIX) IV  40 mg Intravenous Q12H    Continuous Infusions:  magnesium sulfate bolus IVPB       LOS: 7 days     Kayleen Memos, MD [facility] Pager [phone]  If 7PM-7AM, please contact night-coverage [email or web address] Password [email or web address] 05/05/2021, 2:14 PM

## 2021-05-05 NOTE — Sedation Documentation (Signed)
Pt remains stable. No sedation given at this time. Vitals stable

## 2021-05-05 NOTE — Sedation Documentation (Signed)
Pt tolerated procedure very well. Vitals stable

## 2021-05-05 NOTE — TOC Progression Note (Signed)
Transition of Care [facility]) - Progression Note    Patient Details  Name: Leah Pruitt MRN: [id number] Date of Birth: 15-Jun-1945  [facility] of [facility]) CM/SW Contact  Yitzhak Awan, Edson Snowball, RN Phone Number: 05/05/2021, 10:56 AM  Clinical Narrative:     Tommi Rumps with [facility] and Pam with [employer] updated on plan to see if j tube and be repositioned for tube feeds.   Await palliative care recommendations also.   Expected Discharge Plan: [place] Barriers to Discharge: Continued Medical Work up  Expected Discharge Plan and Services Expected Discharge Plan: [place]   Discharge Planning Services: CM Consult Post Acute Care Choice: [place] arrangements for the past 2 months: Single Family Home                 DME Arranged: N/A DME Agency: NA       HH Arranged: PT, RN [place] Agency: [place] Date [facility] Agency Contacted: 04/29/21 Time [place]: 1119 Representative spoke with at [place]: [place] ([place]) Interventions    Readmission Risk Interventions Readmission Risk Prevention Plan 04/19/2021  Transportation Screening Complete  PCP or Specialist Appt within 3-5 Days Complete  HRI or [place] Complete  Social Work Consult for [place] Planning/Counseling Complete  Palliative Care Screening Not Applicable  Medication Review Press photographer) Complete  Some recent data might be hidden

## 2021-05-05 NOTE — Progress Notes (Signed)
The patient is injury-free, afebrile, alert, and oriented X 3. Vital signs were within the baseline during this shift. She complained of abdominal pain, and nausea which controlled with current pain and antiemetic regimens. Pt denies chest pain, SOB, dizziness, signs or symptoms of bleeding, or acute changes during this shift. We will continue to monitor and work toward achieving the care plan goals.

## 2021-05-06 DIAGNOSIS — R1084 Generalized abdominal pain: Secondary | ICD-10-CM

## 2021-05-06 DIAGNOSIS — E43 Unspecified severe protein-calorie malnutrition: Secondary | ICD-10-CM

## 2021-05-06 DIAGNOSIS — Z515 Encounter for palliative care: Secondary | ICD-10-CM

## 2021-05-06 DIAGNOSIS — K56609 Unspecified intestinal obstruction, unspecified as to partial versus complete obstruction: Secondary | ICD-10-CM | POA: Diagnosis not present

## 2021-05-06 LAB — CBC
HCT: 24.2 % — ABNORMAL LOW (ref 36.0–46.0)
Hemoglobin: 8.4 g/dL — ABNORMAL LOW (ref 12.0–15.0)
MCH: 30.1 pg (ref 26.0–34.0)
MCHC: 34.7 g/dL (ref 30.0–36.0)
MCV: 86.7 fL (ref 80.0–100.0)
Platelets: 218 10*3/uL (ref 150–400)
RBC: 2.79 MIL/uL — ABNORMAL LOW (ref 3.87–5.11)
RDW: 13.9 % (ref 11.5–15.5)
WBC: 8.4 10*3/uL (ref 4.0–10.5)
nRBC: 0 % (ref 0.0–0.2)

## 2021-05-06 LAB — BASIC METABOLIC PANEL
Anion gap: 8 (ref 5–15)
BUN: 19 mg/dL (ref 8–23)
CO2: 23 mmol/L (ref 22–32)
Calcium: 8.1 mg/dL — ABNORMAL LOW (ref 8.9–10.3)
Chloride: 100 mmol/L (ref 98–111)
Creatinine, Ser: 0.88 mg/dL (ref 0.44–1.00)
GFR, Estimated: >60 mL/min (ref >60)
Glucose, Bld: 127 mg/dL — ABNORMAL HIGH (ref 70–99)
Potassium: 4 mmol/L (ref 3.5–5.1)
Sodium: 131 mmol/L — ABNORMAL LOW (ref 135–145)

## 2021-05-06 LAB — GLUCOSE, CAPILLARY
Glucose-Capillary: 106 mg/dL — ABNORMAL HIGH (ref 70–99)
Glucose-Capillary: 120 mg/dL — ABNORMAL HIGH (ref 70–99)
Glucose-Capillary: 125 mg/dL — ABNORMAL HIGH (ref 70–99)
Glucose-Capillary: 136 mg/dL — ABNORMAL HIGH (ref 70–99)
Glucose-Capillary: 138 mg/dL — ABNORMAL HIGH (ref 70–99)
Glucose-Capillary: 174 mg/dL — ABNORMAL HIGH (ref 70–99)

## 2021-05-06 LAB — MAGNESIUM: Magnesium: 1.8 mg/dL (ref 1.7–2.4)

## 2021-05-06 LAB — PREALBUMIN: Prealbumin: 5.5 mg/dL — ABNORMAL LOW (ref 18–38)

## 2021-05-06 MED ORDER — ENSURE ENLIVE PO LIQD
237.0000 mL | Freq: Three times a day (TID) | ORAL | Status: DC
  Administered 2021-05-06 – 2021-05-07 (×4): 237 mL via ORAL

## 2021-05-06 MED ORDER — OSMOLITE 1.5 CAL PO LIQD
1000.0000 mL | ORAL | Status: DC
  Administered 2021-05-06 – 2021-05-07 (×2): 1000 mL
  Filled 2021-05-06 (×2): qty 1000

## 2021-05-06 MED ORDER — PROSOURCE TF PO LIQD
45.0000 mL | Freq: Two times a day (BID) | ORAL | Status: DC
  Administered 2021-05-06 – 2021-05-08 (×4): 45 mL
  Filled 2021-05-06 (×6): qty 45

## 2021-05-06 NOTE — Progress Notes (Signed)
Mobility Specialist: Progress Note   05/06/21 1644  Mobility  Activity Refused mobility   Pt refused mobility d/t pain and fatigue. Will f/u tomorrow.   [facility] Dail Mobility Specialist Mobility Specialist 5 North: [phone] Mobility Specialist 6 North: [phone]

## 2021-05-06 NOTE — TOC Progression Note (Addendum)
Transition of Care [facility]) - Progression Note    Patient Details  Name: Leah Pruitt MRN: [id number] Date of Birth: 1945-10-26  Transition of Care [facility]) CM/SW Contact  Jacalyn Lefevre Edson Snowball, RN Phone Number: 05/06/2021, 11:55 AM  Clinical Narrative:     Plan for patient to go home on tube feeds. Discussed with patient and husband at bedside.   Discussed in progression rounds this morning with Dr Nevada Crane. NCM just secure chatted Dr Zenia Resides . NCM will need recommendations to enter orders.   Pam with [facility] and Tommi Rumps with [place] following.   Husband will be taught tube feeds, flushes etc prior to discharge   Spoke to Dr Nevada Crane and dietitian, plan to discharge on cyclic tube feeds. However , patient needs to reach goal continuous rate and then see if she can tolerate a faster cyclic rate. Will needs orders once determined. Cory with [facility] and Pam with [employer] updated.     Expected Discharge Plan: [place] Barriers to Discharge: Continued Medical Work up  Expected Discharge Plan and Services Expected Discharge Plan: [place]   Discharge Planning Services: CM Consult Post Acute Care Choice: [place] arrangements for the past 2 months: Single Family Home                 DME Arranged: N/A DME Agency: NA       HH Arranged: PT, RN [place] Agency: [place] Date [facility] Agency Contacted: 04/29/21 Time [place]: 1119 Representative spoke with at [place]: [place] ([place]) Interventions    Readmission Risk Interventions Readmission Risk Prevention Plan 04/19/2021  Transportation Screening Complete  PCP or Specialist Appt within 3-5 Days Complete  HRI or [place] Complete  Social Work Consult for [place] Planning/Counseling Complete  Palliative Care Screening Not Applicable  Medication Review Press photographer) Complete  Some recent data might be hidden

## 2021-05-06 NOTE — Progress Notes (Signed)
PT Cancellation Note  Patient Details Name: Leah Pruitt MRN: [id number] DOB: 1945-04-22   Cancelled Treatment:    Reason Eval/Treat Not Completed: Other (comment).  Husband reports pt is moving well and does not need therapy.  Revisit this tomorrow to be sure and dc order if not interested.   Ramond Dial 05/06/2021, 11:40 AM  Mee Hives, PT PhD Acute Rehab Dept. Number: [place]

## 2021-05-06 NOTE — Progress Notes (Signed)
Subjective: GJ tube replaced yesterday. Patient had pain overnight but feels better this morning. WBC normal.   Objective: Vital signs in last 24 hours: Temp:  [98.2 F (36.8 C)-98.7 F (37.1 C)] 98.2 F (36.8 C) (02/10 1115) Pulse Rate:  [91-142] 91 (02/10 1115) Resp:  [11-18] 17 (02/10 1115) BP: (95-164)/(58-99) 112/74 (02/10 1115) SpO2:  [96 %-100 %] 100 % (02/10 1115) Weight:  [51.1 kg] 51.1 kg (02/10 0420) Last BM Date: 05/04/21  Intake/Output from previous day: 02/09 0701 - 02/10 0700 In: 110.4 [IV Piggyback:110.4] Out: -  Intake/Output this shift: No intake/output data recorded.  PE: General: resting comfortably, NAD Neuro: alert and oriented, no focal deficits Resp: normal work of breathing on room air Abdomen: soft, nondistended, nontender. Midline incision clean and dry. GJ tube in place, J tube feeds running at 51ml/hr. Extremities: warm and well-perfused   Lab Results:  Recent Labs    05/05/21 0951 05/06/21 0649  WBC 15.4* 8.4  HGB 10.0* 8.4*  HCT 28.9* 24.2*  PLT 271 218    BMET Recent Labs    05/05/21 0951 05/06/21 0649  NA 129* 131*  K 4.8 4.0  CL 95* 100  CO2 24 23  GLUCOSE 136* 127*  BUN 23 19  CREATININE 0.96 0.88  CALCIUM 8.4* 8.1*   PT/INR No results for input(s): LABPROT, INR in the last 72 hours.  CMP     Component Value Date/Time   NA 131 (L) 05/06/2021 0649   NA 133 (A) 03/31/2021 0000   K 4.0 05/06/2021 0649   CL 100 05/06/2021 0649   CO2 23 05/06/2021 0649   GLUCOSE 127 (H) 05/06/2021 0649   BUN 19 05/06/2021 0649   BUN 17 03/31/2021 0000   CREATININE 0.88 05/06/2021 0649   CALCIUM 8.1 (L) 05/06/2021 0649   PROT 5.3 (L) 05/05/2021 0951   ALBUMIN 3.2 (L) 05/05/2021 0951   AST 35 05/05/2021 0951   ALT 13 05/05/2021 0951   ALKPHOS 100 05/05/2021 0951   BILITOT 1.2 05/05/2021 0951   GFRNONAA >60 05/06/2021 0649   GFRAA >60 10/11/2016 1100   Lipase     Component Value Date/Time   LIPASE 21 04/28/2021  1305       Studies/Results: IR GJ Tube Change  Result Date: 05/05/2021 INDICATION: History of pancreatic cancer with complete obstruction of the horizontal segment of the duodenum, post gastrojejunostomy surgical bypass with placement of a feeding gastrojejunostomy tube. Unfortunately, the surgically placed gastrojejunostomy jejunal lumen is now coiled with the entirety within the gastric lumen. Patient now presents for attempted fluoroscopic guided replacement. EXAM: FLUOROSCOPIC GUIDED REPLACEMENT OF GASTROJEJUNOSTOMY TUBE COMPARISON:  CT abdomen pelvis-04/28/2021; abdominal radiograph-earlier same day MEDICATIONS: None. CONTRAST:  36mL OMNIPAQUE IOHEXOL 300 MG/ML SOLN administered into the gastric lumen and proximal small bowel. FLUOROSCOPY TIME:  11 minutes (36 seconds) COMPLICATIONS: None. PROCEDURE: Informed written consent was obtained from the patient after a discussion of the risks and benefits. The upper abdomen and the external portion of the existing gastrojejunostomy tube was prepped and draped in the usual sterile fashion, and a sterile drape was applied covering the operative field. Maximum barrier sterile technique with sterile gowns and gloves were used for the procedure. A timeout was performed prior to the initiation of the procedure. Both lumens of the gastrojejunostomy catheter with injection confirming both were positioned within the gastric lumen. As such, the external portion of the surgically placed gastrojejunostomy tube was cut and the retention balloon was deflated. The  gastrojejunostomy tube was partially retracted and then the jejunal lumen was cut and cannulated with a stiff Glidewire which was cannulated within the gastric lumen Under intermittent fluoroscopic guidance, the remainder of the Collins tube was exchanged for a C2 catheter which was advanced through the gastric antrum to the level of the proximal duodenum. Attempts were made to traverse the obstructed portion of the  horizontal segment of the duodenum however this ultimately proved unsuccessful. Next, the C2 catheter was utilized to select the surgical gastrojejunostomy. Contrast injection confirmed appropriate positioning Next, over a regular glidewire, the C2 catheter was exchanged for a 53 French balloon retention gastrojejunostomy catheter with tip ultimately terminating within the proximal/mid small bowel though there is some redundant jejunal lumen within the gastric fundus. The retention balloon was insufflated with approximately 10 cc of saline and dilute contrast. The external disc was cinched. Contrast injection confirmed appropriate positioning and functionality of both the gastric and jejunal lumens. A dressing was applied. The patient tolerated the procedure well without immediate postprocedural complication. IMPRESSION: Technically successful fluoroscopic guided replacement and repositioning 20 French balloon retention gastrojejunostomy catheter with weighted tip of the jejunal lumen terminating through the surgical gastrojejunostomy anastomosis within the proximal/mid small bowel though note is made of some redundant jejunal lumen coiled within the gastric fundus. Both lumens are ready for immediate usage. Electronically Signed   By: Sandi Mariscal M.D.   On: 05/05/2021 16:07   DG CHEST PORT 1 VIEW  Result Date: 05/05/2021 CLINICAL DATA:  Tachycardic this morning. Bilateral leg and right arm swelling. EXAM: PORTABLE CHEST 1 VIEW COMPARISON:  AP chest 04/11/2021 FINDINGS: Cardiac silhouette is again at the upper limits of normal size. Mediastinal contours are within normal limits with calcification seen within the aortic arch. New opacity within the right lung mid height mid to lateral transverse location. The left lung is clear. No definite pleural effusion. No pneumothorax. Cholecystectomy clips. Bilateral catheter tubing overlies the partially visualized kidneys. Moderate multilevel degenerative disc changes.  IMPRESSION: New right mid lung heterogeneous airspace opacity concerning for pneumonia. Electronically Signed   By: Yvonne Kendall M.D.   On: 05/05/2021 10:18   DG Abd Portable 1V  Result Date: 05/05/2021 CLINICAL DATA:  Severe abdominal pain, left-greater-than-right. Decreased urine output. Peg tube is to be adjusted later today. EXAM: PORTABLE ABDOMEN - 1 VIEW COMPARISON:  CT abdomen and pelvis and KUB 04/28/2021 FINDINGS: Percutaneous gastrojejunostomy tube is again noted. The distal tip is horizontal in orientation and may overlie the third portion of the duodenum, however this appears close to portions of the tubing extending along likely the greater curvature of the stomach not exclude that the tip could be also overlying the stomach. Nonobstructive bowel-gas pattern. Air is seen within the colon and rectum. No portal venous gas or pneumatosis. The lung bases are clear. Mild multilevel degenerative disc changes of the thoracic spine. IMPRESSION:: IMPRESSION: 1. Nonobstructed bowel-gas pattern. 2. Gastrojejunostomy tube is again noted. It is difficult to determine whether the distal tip is in the third portion of the duodenum versus the stomach. The tip was within the third portion of the duodenum on recent 04/28/2021 CT. Electronically Signed   By: Yvonne Kendall M.D.   On: 05/05/2021 10:25    Anti-infectives: Anti-infectives (From admission, onward)    Start     Dose/Rate Route Frequency Ordered Stop   05/05/21 1600  ceFEPIme (MAXIPIME) 2 g in sodium chloride 0.9 % 100 mL IVPB        2 g  200 mL/hr over 30 Minutes Intravenous Every 12 hours 05/05/21 1446     05/05/21 1600  metroNIDAZOLE (FLAGYL) IVPB 500 mg        500 mg 100 mL/hr over 60 Minutes Intravenous Every 12 hours 05/05/21 1446          Assessment/Plan 76 yo female with a locally advanced cancer of the body of the pancreas causing duodenal obstruction and left hydronephrosis. Now 4 weeks s/p gastrojejunal bypass with placement of  feeding GJ tube. GJ tube placed yesterday. - J tube feeds at 10, advance by 15ml/hr q6h to goal of 45 - Continue venting G tube prn - Palliative care consulted for symptom management - Full liquid diet - On cefepime for HCAP - VTE: lovenox, SCDs - Ambulate TID - Dispo: inpatient, med-surg floor.    LOS: 8 days    Michaelle Birks, MD [facility] General, Hepatobiliary and Pancreatic Surgery 05/06/21 11:19 AM

## 2021-05-06 NOTE — Progress Notes (Signed)
PROGRESS NOTE  Leah Pruitt NWG:[id number] DOB: [birth year] DOA: 04/28/2021 PCP: Emmaline Kluver, MD  HPI/Recap of past 24 hours: Leah Pruitt is a 76 y.o. female with past medical history of hypertension, diabetes mellitus type 2, GERD, and pancreatic mass s/p exploratory laparotomy with gastrojejunostomy tube placement on 04/08/21 by general surgery Dr. Zenia Resides for duodenal obstruction presented with complaints of being unable to tolerate oral intake due to nausea, vomiting, abdominal distention, and diffuse abdominal pain.  Previously she notes she had been on TPN, but had discomfort from the PICC line in her arm and states that it made her blood sugars elevated.  Furthermore, she does not want the nasogastric tube placed either.  She would like the [place] tube that was placed to be able to be used.  Patient was admitted under general surgery service and hospitalist team was consulted for medical co-management of hypertension and type 2 diabetes.  Hospital course complicated by severe abdominal pain, refractory to high doses of IV Dilaudid.  Palliative care team was consulted by general surgery to assist with the management of her intractable abdominal pain.  Post GJ tube evaluation and manipulation by IR on 05/05/2021.  Met sepsis criteria with leukocytosis, tachycardia, right middle lobe infiltrates suggestive of pneumonia for which she was started on cefepime and IV Flagyl.  05/06/2021: Seen at her bedside husband was present in the room.  Somnolent but arousable to voices.  Abdominal pain is controlled with pain medication.  Later in the morning she requested a solid diet after her diet was advanced to full liquid by general surgery.  She is not as nauseous, defer advancement of diet to primary team, general surgery.  Responded well to IV antibiotics.  Leukocytosis has resolved, afebrile.  Planning to switch IV antibiotics to Augmentin at discharge.  TOC assisting with DC planning.  Patient will  continue tube feeding at home.  Prior to admission, at home, she was taking tube feeding at night and eating a solid diet during the day.  We will obtain a prealbumin level.  Assessment/Plan: Principal Problem:   Small bowel obstruction secondary to pancreatic cancer Active Problems:   Essential (primary) hypertension   Gastro-esophageal reflux disease without esophagitis   Type 2 diabetes mellitus without complication (HCC)   Protein-calorie malnutrition, severe   Pancreatic cancer (HCC)   Leukocytosis   Hyponatremia  Improving intractable nausea/vomiting/history of ex laparotomy, biopsy of pancreatic mass, GJ bypass with placement of GJ tube, post GJ tube evaluation and manipulation by IR on 05/05/2021:  Management per primary team General surgery. Pain management per palliative care team, consulted by primary team.  Post GJ tube evaluation and manipulation by IR on 05/05/2021. Abdominal x-ray done on 05/05/2021, showing nonobstructive bowel gas pattern.  Gastrojejunostomy tube in place. Diet advanced by general surgery. Nausea is improved and she is requesting a regular consistency diet. IV antiemetics as needed Appreciate dietitian and TOC's assistance.  Resolving sepsis secondary to right middle lobe pneumonia, possibly present on admission, with concern for aspiration Personally reviewed chest x-ray done on 05/05/2021.  It shows right middle lobe infiltrates suggestive of pneumonia. Possible aspiration from intractable vomiting, suspect present on admission. Leukocytosis resolved.  Afebrile. On 2/9/she had worsening leukocytosis, tachycardia, elevated CRP Blood cultures x2 peripherally, negative to date. Cefepime and IV Flagyl, started on 05/05/2021 Plan to switch to Augmentin at discharge.  Resolved sinus tachycardia, suspect driven by sepsis Obtain twelve-lead EKG Start gentle IV fluid hydration LR 50 cc/h x 2 days Follow blood  cultures, sputum culture. Continue to treat underlying  conditions  Resolved post repletion: Hypomagnesemia Serum magnesium 1.5> 1.8.  Improving, post IV fluid hydration, hypovolemic hyponatremia Serum sodium 129> 131 TSH within normal limit, 0.673. Continue gentle IV fluid hydration LR 50 cc/h x 1 day\.  Acute blood loss anemia/chronic normocytic anemia Could be contributed by IV fluid, dilutional. Hemoglobin 8.4 from 10.0. No overt bleeding. Continue to monitor repeat CBC in the morning.  Prediabetes with hyperglycemia, hypoglycemia is improved:  Recent hemoglobin A1c about a month ago was 6.4.   She is not taking any medications or insulin at home for this.   She was running low blood sugar due to decreased p.o. intake, started on dextrose/normal saline at 100 cc/h, eventually fluids were stopped by general surgery and she was advanced to regular diet and her blood sugars fairly controlled. on SSI only now for hyperglycemia.   Questionable hypertension:  Hypertension is mentioned in the H&P.  I am not really sure if she has the history are not as she does not seem to be on any medications and yet her blood pressures controlled.   BP is normotensive.   Resolved AKI, likely prerenal in the setting of dehydration from poor oral intake. Baseline creatinine appears to be 0.8 with GFR greater than 60 Presented with creatinine of 1.4 She is back to her baseline creatinine 0.8 with GFR greater than 60. Continue to encourage increase in oral protein calorie intake.  Thank you for allowing [place] to participate in the care of this patient.  We will continue to follow along with you.   DVT prophylaxis: Subcu Lovenox daily.    Code Status: Full Code   Family Communication: Husband of 59 years present at bedside.     Objective: Vitals:   05/05/21 1947 05/06/21 0420 05/06/21 0736 05/06/21 1115  BP: 109/67 100/66 (!) 95/58 112/74  Pulse: (!) 115 99 (!) 107 91  Resp: _0 Temp: 98.2 F (36.8 C) 98.2 F (36.8 C) 98.7 F (37.1 C)  98.2 F (36.8 C)  TempSrc: Oral Oral Oral Oral  SpO2: 98% 97% 96% 100%  Weight:  51.1 kg    Height:        Intake/Output Summary (Last 24 hours) at 05/06/2021 1244 Last data filed at 05/06/2021 1124 Gross per 24 hour  Intake 470.27 ml  Output --  Net 470.27 ml   Filed Weights   04/28/21 1255 05/06/21 0420  Weight: 50.8 kg 51.1 kg    Exam:  General: 75 y.o. year-old female frail-appearing in no acute distress.  She is somnolent but easily arousable to voices.   Cardiovascular: Regular rate and rhythm no rubs gallops. Respiratory: Mild rales at bases no wheezing noted.  Poor inspiratory effort. Abdomen: GJ tube in place.  Bowel sounds present. Musculoskeletal: No lower extremity edema bilaterally.   Skin: No ulcerative lesions or rashes. Psychiatry: Mood is appropriate for condition and setting. Neuro exam: Moves all 4 extremities.  Nonfocal exam.   Data Reviewed: CBC: Recent Labs  Lab 05/05/21 0951 05/06/21 0649  WBC 15.4* 8.4  NEUTROABS 13.6*  --   HGB 10.0* 8.4*  HCT 28.9* 24.2*  MCV 85.8 86.7  PLT 271 998   Basic Metabolic Panel: Recent Labs  Lab 05/01/21 0059 05/02/21 0118 05/04/21 0357 05/05/21 0749 05/05/21 0951 05/06/21 0649  NA 133* 129* 131*  --  129* 131*  K 3.5 3.7 4.1  --  4.8 4.0  CL 103 100 96*  --  95* 100  CO2 19* 23 25  --  24 23  GLUCOSE 131* 108* 125*  --  136* 127*  BUN _0 --  23 19  CREATININE 0.97 0.92 0.88 1.01* 0.96 0.88  CALCIUM 8.6* 8.5* 8.7*  --  8.4* 8.1*  MG  --   --   --   --  1.5* 1.8  PHOS  --   --   --   --  4.2  --    GFR: Estimated Creatinine Clearance: 42.3 mL/min (by C-G formula based on SCr of 0.88 mg/dL). Liver Function Tests: Recent Labs  Lab 05/05/21 0951  AST 35  ALT 13  ALKPHOS 100  BILITOT 1.2  PROT 5.3*  ALBUMIN 3.2*   No results for input(s): LIPASE, AMYLASE in the last 168 hours.  No results for input(s): AMMONIA in the last 168 hours. Coagulation Profile: No results for input(s): INR,  PROTIME in the last 168 hours.  Cardiac Enzymes: No results for input(s): CKTOTAL, CKMB, CKMBINDEX, TROPONINI in the last 168 hours. BNP (last 3 results) No results for input(s): PROBNP in the last 8760 hours. HbA1C: No results for input(s): HGBA1C in the last 72 hours. CBG: Recent Labs  Lab 05/05/21 1945 05/06/21 0020 05/06/21 0421 05/06/21 0738 05/06/21 1118  GLUCAP 111* 138* 125* 136* 106*   Lipid Profile: No results for input(s): CHOL, HDL, LDLCALC, TRIG, CHOLHDL, LDLDIRECT in the last 72 hours. Thyroid Function Tests: Recent Labs    05/05/21 1637  TSH 0.673   Anemia Panel: No results for input(s): VITAMINB12, FOLATE, FERRITIN, TIBC, IRON, RETICCTPCT in the last 72 hours. Urine analysis:    Component Value Date/Time   COLORURINE COLORLESS (A) 03/22/2021 1851   APPEARANCEUR CLEAR 03/22/2021 1851   LABSPEC 1.005 03/22/2021 1851   PHURINE 9.0 (H) 03/22/2021 1851   GLUCOSEU NEGATIVE 03/22/2021 1851   HGBUR NEGATIVE 03/22/2021 1851   BILIRUBINUR NEGATIVE 03/22/2021 1851   KETONESUR NEGATIVE 03/22/2021 1851   PROTEINUR NEGATIVE 03/22/2021 1851   UROBILINOGEN 0.2 11/15/2012 2157   NITRITE NEGATIVE 03/22/2021 1851   LEUKOCYTESUR NEGATIVE 03/22/2021 1851   Sepsis Labs: _1 (procalcitonin:4,lacticidven:4)  ) Recent Results (from the past 240 hour(s))  Blood Culture (routine x 2)     Status: None   Collection Time: 04/28/21 12:38 PM   Specimen: BLOOD  Result Value Ref Range Status   Specimen Description BLOOD LEFT ANTECUBITAL  Final   Special Requests   Final    BOTTLES DRAWN AEROBIC AND ANAEROBIC Blood Culture results may not be optimal due to an inadequate volume of blood received in culture bottles   Culture   Final    NO GROWTH 5 DAYS Performed at [facility], [place] [address] [place],  [address]    Report Status 05/03/2021 FINAL  Final  Resp Panel by RT-PCR (Flu A&B, Covid) Nasopharyngeal Swab     Status: None   Collection Time: 04/28/21  12:38 PM   Specimen: Nasopharyngeal Swab; Nasopharyngeal(NP) swabs in vial transport medium  Result Value Ref Range Status   SARS Coronavirus 2 by RT PCR NEGATIVE NEGATIVE Final    Comment: (NOTE) SARS-CoV-2 target nucleic acids are NOT DETECTED.  The SARS-CoV-2 RNA is generally detectable in upper respiratory specimens during the acute phase of infection. The lowest concentration of SARS-CoV-2 viral copies this assay can detect is 138 copies/mL. A negative result does not preclude SARS-Cov-2 infection and should not be used as the sole basis for treatment or other patient management  decisions. A negative result may occur with  improper specimen collection/handling, submission of specimen other than nasopharyngeal swab, presence of viral mutation(s) within the areas targeted by this assay, and inadequate number of viral copies(<138 copies/mL). A negative result must be combined with clinical observations, patient history, and epidemiological information. The expected result is Negative.  Fact Sheet for Patients:  EntrepreneurPulse.com.au  Fact Sheet for Healthcare Providers:  IncredibleEmployment.be  This test is no t yet approved or cleared by the [place] FDA and  has been authorized for detection and/or diagnosis of SARS-CoV-2 by FDA under an Emergency Use Authorization (EUA). This EUA will remain  in effect (meaning this test can be used) for the duration of the COVID-19 declaration under Section 564(b)(1) of the Act, 21 U.S.C.section 360bbb-3(b)(1), unless the authorization is terminated  or revoked sooner.       Influenza A by PCR NEGATIVE NEGATIVE Final   Influenza B by PCR NEGATIVE NEGATIVE Final    Comment: (NOTE) The Xpert Xpress SARS-CoV-2/FLU/RSV plus assay is intended as an aid in the diagnosis of influenza from Nasopharyngeal swab specimens and should not be used as a sole basis for treatment. Nasal washings and aspirates  are unacceptable for Xpert Xpress SARS-CoV-2/FLU/RSV testing.  Fact Sheet for Patients: EntrepreneurPulse.com.au  Fact Sheet for Healthcare Providers: IncredibleEmployment.be  This test is not yet approved or cleared by the [place] FDA and has been authorized for detection and/or diagnosis of SARS-CoV-2 by FDA under an Emergency Use Authorization (EUA). This EUA will remain in effect (meaning this test can be used) for the duration of the COVID-19 declaration under Section 564(b)(1) of the Act, 21 U.S.C. section 360bbb-3(b)(1), unless the authorization is terminated or revoked.  Performed at [facility], [place] [address] [place], [place] [address]   Blood Culture (routine x 2)     Status: None   Collection Time: 04/28/21  1:29 PM   Specimen: BLOOD  Result Value Ref Range Status   Specimen Description BLOOD LEFT ANTECUBITAL  Final   Special Requests   Final    BOTTLES DRAWN AEROBIC AND ANAEROBIC Blood Culture adequate volume   Culture   Final    NO GROWTH 5 DAYS Performed at [facility], [place] [address] [place], [place] [address]    Report Status 05/03/2021 FINAL  Final  Culture, blood (routine x 2)     Status: None (Preliminary result)   Collection Time: 05/05/21  4:36 PM   Specimen: BLOOD  Result Value Ref Range Status   Specimen Description BLOOD RIGHT ANTECUBITAL  Final   Special Requests   Final    BOTTLES DRAWN AEROBIC AND ANAEROBIC Blood Culture adequate volume   Culture   Final    NO GROWTH < 24 HOURS Performed at [facility], [place] [address], [place], [place] [address]    Report Status PENDING  Incomplete  Culture, blood (routine x 2)     Status: None (Preliminary result)   Collection Time: 05/05/21  4:37 PM   Specimen: BLOOD  Result Value Ref Range Status   Specimen Description BLOOD RIGHT ANTECUBITAL  Final   Special Requests   Final    BOTTLES DRAWN AEROBIC AND ANAEROBIC Blood Culture adequate  volume   Culture   Final    NO GROWTH < 24 HOURS Performed at [facility], [place] [address], [place], [place] [address]    Report Status PENDING  Incomplete      Studies: IR GJ  Tube Change  Result Date: 05/05/2021 INDICATION: History of pancreatic cancer with complete obstruction of the horizontal segment of the duodenum, post gastrojejunostomy surgical bypass with placement of a feeding gastrojejunostomy tube. Unfortunately, the surgically placed gastrojejunostomy jejunal lumen is now coiled with the entirety within the gastric lumen. Patient now presents for attempted fluoroscopic guided replacement. EXAM: FLUOROSCOPIC GUIDED REPLACEMENT OF GASTROJEJUNOSTOMY TUBE COMPARISON:  CT abdomen pelvis-04/28/2021; abdominal radiograph-earlier same day MEDICATIONS: None. CONTRAST:  19m OMNIPAQUE IOHEXOL 300 MG/ML SOLN administered into the gastric lumen and proximal small bowel. FLUOROSCOPY TIME:  11 minutes (36 seconds) COMPLICATIONS: None. PROCEDURE: Informed written consent was obtained from the patient after a discussion of the risks and benefits. The upper abdomen and the external portion of the existing gastrojejunostomy tube was prepped and draped in the usual sterile fashion, and a sterile drape was applied covering the operative field. Maximum barrier sterile technique with sterile gowns and gloves were used for the procedure. A timeout was performed prior to the initiation of the procedure. Both lumens of the gastrojejunostomy catheter with injection confirming both were positioned within the gastric lumen. As such, the external portion of the surgically placed gastrojejunostomy tube was cut and the retention balloon was deflated. The gastrojejunostomy tube was partially retracted and then the jejunal lumen was cut and cannulated with a stiff Glidewire which was cannulated within the gastric lumen Under intermittent fluoroscopic guidance, the remainder of the GCohoestube was exchanged for a C2  catheter which was advanced through the gastric antrum to the level of the proximal duodenum. Attempts were made to traverse the obstructed portion of the horizontal segment of the duodenum however this ultimately proved unsuccessful. Next, the C2 catheter was utilized to select the surgical gastrojejunostomy. Contrast injection confirmed appropriate positioning Next, over a regular glidewire, the C2 catheter was exchanged for a 267French balloon retention gastrojejunostomy catheter with tip ultimately terminating within the proximal/mid small bowel though there is some redundant jejunal lumen within the gastric fundus. The retention balloon was insufflated with approximately 10 cc of saline and dilute contrast. The external disc was cinched. Contrast injection confirmed appropriate positioning and functionality of both the gastric and jejunal lumens. A dressing was applied. The patient tolerated the procedure well without immediate postprocedural complication. IMPRESSION: Technically successful fluoroscopic guided replacement and repositioning 20 French balloon retention gastrojejunostomy catheter with weighted tip of the jejunal lumen terminating through the surgical gastrojejunostomy anastomosis within the proximal/mid small bowel though note is made of some redundant jejunal lumen coiled within the gastric fundus. Both lumens are ready for immediate usage. Electronically Signed   By: JSandi MariscalM.D.   On: 05/05/2021 16:07    Scheduled Meds:  docusate sodium  100 mg Oral BID   enoxaparin (LOVENOX) injection  40 mg Subcutaneous Q24H   feeding supplement  237 mL Oral TID BM   feeding supplement (OSMOLITE 1.5 CAL)  1,000 mL Per Tube Q24H   feeding supplement (PROSource TF)  45 mL Per Tube BID   insulin aspart  0-9 Units Subcutaneous TID WC   metoCLOPramide (REGLAN) injection  5 mg Intravenous Q6H   pantoprazole (PROTONIX) IV  40 mg Intravenous Q12H    Continuous Infusions:  ceFEPime (MAXIPIME) IV 2 g  (05/06/21 0615)   lactated ringers 50 mL/hr at 05/06/21 1002   metronidazole 500 mg (05/06/21 0430)     LOS: 8 days     CKayleen Memos MD [facility] Pager [phone] If 7PM-7AM, please contact night-coverage [email or web address] Password [email or web address]  05/06/2021, 12:44 PM

## 2021-05-06 NOTE — Progress Notes (Signed)
°                                                   °  Palliative Care Progress Note, Assessment & Plan   Patient Name: Leah Pruitt       Date: 05/06/2021 DOB: 1945-04-04  Age: 76 y.o. MRN#: [id number] Attending Physician: Dwan Bolt, MD Primary Care Physician: Street, [name], MD Admit Date: 04/28/2021  Reason for Consultation/Follow-up: Establishing goals of care and Pain control  I have reviewed medical records including EPIC notes, labs and imaging, received report from [place], assessed the patient and then met with patient and her husband at bedside to discuss diagnosis prognosis, [place], EOL wishes, disposition, and options.  Patient's husband began speaking English to me as I entered the room and my visit was short so no interpreter was needed.  I introduced Palliative Medicine as specialized medical care for people living with serious illness. It focuses on providing relief from the symptoms and stress of a serious illness.   Patient's husband said she had just become settled and seemed like her pain was better.  He asked that I return tomorrow to speak with both the patient and himself.  He apologized but said this is the most rest that he has seen her receiving and did not want to wake her up.  Palliative medicine team will continue to follow the patient throughout her hospitalization.  I am off service after today we will wait and ensure that a colleague of mine rounds on the patient.  [place] Ilsa Iha, FNP-BC Palliative Medicine Team Team Phone # [phone]  NO CHARGE

## 2021-05-06 NOTE — Progress Notes (Signed)
OT Cancellation Note  Patient Details Name: Leah Pruitt MRN: [id number] DOB: [birth year]   Cancelled Treatment:    Reason Eval/Treat Not Completed: OT screened, no needs identified, will sign off. Pt is independent in self care per husband. She does have difficulty standing from comfort height toilet at home, educated husband in use of 3 in 1 over toilet. No further OT or DME needs.   [name] 05/06/2021, 12:58 PM Nestor Lewandowsky, OTR/L Acute Rehabilitation Services Pager: [phone] Office: [phone]

## 2021-05-06 NOTE — Care Management Important Message (Signed)
Important Message  Patient Details  Name: Leah Pruitt MRN: [id number] Date of Birth: 01-Jan-1946   Medicare Important Message Given:  Yes     Hannah Beat 05/06/2021, 11:37 AM

## 2021-05-06 NOTE — Progress Notes (Signed)
Nutrition Follow-up  DOCUMENTATION CODES:  Severe malnutrition in context of acute illness/injury  INTERVENTION:  Continue tube feedings via J-tube. Continue advancing to goal rate of 55mL/h: Osmolite 1.5  at goal rate of 40mL/h (1.08L total) Prosource TF BID (11g of protein and 40 kcal/packet) Standard free water flush of 1mL q4h This provides 1700kcal, 90g of protein, and 1055mL free water (TF+flush) Once tolerating continuous feeds, transition to nocturnal feeds per pt request to allow for normal PO intake during the day. Recommend the following Osmolite 1.5 at 18mL/h x 15 hours (7PM-10AM) for a total volume of 946mL/d (4 cartons needed daily) This will provide 1350kcal (~85% of nutrition needs), 56g of protein (~71% of needs), 614mL free water Monitor SBG levels for need to adjust TF formula Advance to regular diet as tolerated encourage PO intake  Snacks TID between meals Ensure Enlive po TID, each supplement provides 350 kcal and 20 grams of protein.  NUTRITION DIAGNOSIS:  Severe Malnutrition (in the context of acute illness) related to nausea, decreased appetite as evidenced by moderate muscle depletion, percent weight loss (10.7% x 3 months). - ongoing  GOAL:  Patient will meet greater than or equal to 90% of their needs - supplements in place, diet in place, TF infusing  MONITOR:  PO intake, Supplement acceptance, Weight trends  REASON FOR ASSESSMENT:  Consult Poor PO, Assessment of nutrition requirement/status  ASSESSMENT:  76 y.o. female with history of pancreatic cancer with recent gastrojejunal bypass surgery with GJ tube placement (admitted 1/12-1/24), HTN, GERD, diabetes, osteoporosis presented to ED with abdominal pain, vomiting, and excessive drainage from JP drain  Completed kcal count 2/6-2/8 which showed pt was not meeting her nutrition needs. Surgery team discussed the need for adequate and consistent nutrition with pt and husband and they decided to proceed  with adjusting J tube and repositioning which was completed 2/9. Trickles of 49mL/h initiated overnight  Spoke with MD about feeds this AM, ok to increase rate to goal.  Pt resting in bedside chair at the time of assessment, husband at bedside assists with assessment. Pt is feeling much better after having J tube adjusted yesterday. Reports nausea is improved this AM and pt is hungry. Currently on a full liquid diet, pt requesting an upgrade to regular consistency foods.   TF infusing at bedside, continue to run at 58mL/h. Talked to husband about being unable to administer bolus feeds due to the location of her tube. Talked about the difference between continuous feeds or nocturnal feeds. Pt prefers to be able to eat during the day and receive her feeds at night.   Will place recommendations to be utilized while admitted and also recommendations for nocturnal feeds which can begin prior to discharge if pt is tolerating continuous feeds.   Average Meal Intake: 2/3-2/6: 33% intake x 3 recorded meals 2/7-2/10: minimal oral intake recorded, 48h kcal count completed during this time.  Nutritionally Relevant Medications: Scheduled Meds:  docusate sodium  100 mg Oral BID   feeding supplement  237 mL Oral TID BM   OSMOLITE 1.5 CAL  1,000 mL Per Tube Q24H   insulin aspart  0-9 Units Subcutaneous TID WC   metoCLOPramide injection  5 mg Intravenous Q6H   pantoprazole IV  40 mg Intravenous Q12H   Continuous Infusions:  ceFEPime (MAXIPIME) IV 2 g (05/06/21 0615)   lactated ringers 50 mL/hr at 05/05/21 1808   metronidazole 500 mg (05/06/21 0430)   PRN Meds: bisacodyl, diphenhydrAMINE, magnesium hydroxide, meclizine, ondansetron, prochlorperazine, simethicone  Labs Reviewed: Sodium 129 SBG ranges from 74-123 mg/dL over the last 24 hours HgbA1c 5.9% (2/4)   NUTRITION - FOCUSED PHYSICAL EXAM: Flowsheet Row Most Recent Value  Orbital Region Mild depletion  Upper Arm Region No depletion  Thoracic  and Lumbar Region No depletion  Buccal Region Mild depletion  Temple Region Moderate depletion  Clavicle Bone Region Mild depletion  Clavicle and Acromion Bone Region Mild depletion  Scapular Bone Region Mild depletion  Dorsal Hand Mild depletion  Patellar Region Moderate depletion  Anterior Thigh Region Moderate depletion  Posterior Calf Region Moderate depletion  Edema (RD Assessment) Mild  [BLE]  Hair Reviewed  Eyes Reviewed  Mouth Reviewed  Skin Reviewed  Nails Reviewed   Diet Order:   Diet Order             Diet full liquid Room service appropriate? Yes; Fluid consistency: Thin  Diet effective now                   EDUCATION NEEDS:  Education needs have been addressed  Skin:  Skin Assessment: Reviewed RN Assessment  Last BM:  2/8  Height:  Ht Readings from Last 1 Encounters:  04/28/21 5\' 1"  (1.549 m)    Weight:  Wt Readings from Last 1 Encounters:  05/06/21 51.1 kg    Ideal Body Weight:  47.7 kg  BMI:  Body mass index is 21.29 kg/m.  Estimated Nutritional Needs:  Kcal:  1600-1800 kcal/d Protein:  80-90 g/d Fluid:  >1.8L/d   Ranell Patrick, RD, LDN Clinical Dietitian RD pager # available in AMION  After hours/weekend pager # available in [facility]

## 2021-05-07 DIAGNOSIS — F439 Reaction to severe stress, unspecified: Secondary | ICD-10-CM

## 2021-05-07 DIAGNOSIS — R1084 Generalized abdominal pain: Secondary | ICD-10-CM | POA: Diagnosis not present

## 2021-05-07 DIAGNOSIS — Z515 Encounter for palliative care: Secondary | ICD-10-CM | POA: Diagnosis not present

## 2021-05-07 DIAGNOSIS — Z7189 Other specified counseling: Secondary | ICD-10-CM

## 2021-05-07 DIAGNOSIS — R638 Other symptoms and signs concerning food and fluid intake: Secondary | ICD-10-CM

## 2021-05-07 DIAGNOSIS — G479 Sleep disorder, unspecified: Secondary | ICD-10-CM

## 2021-05-07 DIAGNOSIS — K56609 Unspecified intestinal obstruction, unspecified as to partial versus complete obstruction: Secondary | ICD-10-CM | POA: Diagnosis not present

## 2021-05-07 LAB — GLUCOSE, CAPILLARY
Glucose-Capillary: 168 mg/dL — ABNORMAL HIGH (ref 70–99)
Glucose-Capillary: 140 mg/dL — ABNORMAL HIGH (ref 70–99)
Glucose-Capillary: 141 mg/dL — ABNORMAL HIGH (ref 70–99)
Glucose-Capillary: 145 mg/dL — ABNORMAL HIGH (ref 70–99)

## 2021-05-07 MED ORDER — TRAMADOL HCL 50 MG PO TABS
50.0000 mg | ORAL_TABLET | Freq: Two times a day (BID) | ORAL | Status: DC
  Administered 2021-05-07 – 2021-05-08 (×3): 50 mg via ORAL
  Filled 2021-05-07 (×3): qty 1

## 2021-05-07 MED ORDER — PANTOPRAZOLE SODIUM 40 MG PO TBEC
40.0000 mg | DELAYED_RELEASE_TABLET | Freq: Two times a day (BID) | ORAL | Status: DC
  Administered 2021-05-07 – 2021-05-08 (×3): 40 mg via ORAL
  Filled 2021-05-07 (×3): qty 1

## 2021-05-07 MED ORDER — METOCLOPRAMIDE HCL 5 MG PO TABS
5.0000 mg | ORAL_TABLET | Freq: Once | ORAL | Status: AC
  Administered 2021-05-07: 5 mg via ORAL
  Filled 2021-05-07: qty 1

## 2021-05-07 MED ORDER — SENNOSIDES-DOCUSATE SODIUM 8.6-50 MG PO TABS
2.0000 | ORAL_TABLET | Freq: Two times a day (BID) | ORAL | Status: DC
  Administered 2021-05-07 – 2021-05-08 (×3): 2 via ORAL
  Filled 2021-05-07 (×3): qty 2

## 2021-05-07 MED ORDER — ALPRAZOLAM 0.25 MG PO TABS: 0.2500 mg | ORAL_TABLET | Freq: Every evening | ORAL | Status: DC | PRN

## 2021-05-07 MED ORDER — CEFDINIR 300 MG PO CAPS
300.0000 mg | ORAL_CAPSULE | Freq: Two times a day (BID) | ORAL | Status: DC
  Administered 2021-05-07 – 2021-05-08 (×3): 300 mg via ORAL
  Filled 2021-05-07 (×4): qty 1

## 2021-05-07 NOTE — Progress Notes (Signed)
PT Cancellation Note  Patient Details Name: Leah Pruitt MRN: [id number] DOB: 06/12/1945   Cancelled Treatment:    Reason Eval/Treat Not Completed: Patient declined, no reason specified (Pt evaluated last week, moving independently.  Therapy ordered D/C'd.  New order placed and pt/family state they are still moving okay and do not want therapy.  Order to be D/C'd at this time.  NSG present and aware.)  Yuto Cajuste A. Bryce Cheever, PT, DPT Acute Rehabilitation Services Office: [place] 05/07/2021, 10:11 AM

## 2021-05-07 NOTE — Progress Notes (Signed)
Mobility Specialist: Progress Note   05/07/21 1355  Mobility  Activity Refused mobility   Pt refused mobility with husband stating she just walked in the hallway. Will f/u as able.   [facility] Roselene Gray Mobility Specialist Mobility Specialist 5 North: [phone] Mobility Specialist 6 North: [phone]

## 2021-05-07 NOTE — Progress Notes (Signed)
Pt has no iv access , night nurse reported that previous access infiltrated and pt declined new iv. Explained need and benefits of having iv access to pt and her husband but she declined to have one inserted.

## 2021-05-07 NOTE — Progress Notes (Signed)
Subjective: No IV access currently and she does not want further attempts and adamantly refuses PICC. Pain is overall controlled with robaxin and oxycodone PO. She is tolerating tube feeds without nausea or emesis. We discussed trying soft diet today - she does not like the hospital food.   Objective: Vital signs in last 24 hours: Temp:  [98.2 F (36.8 C)-98.4 F (36.9 C)] 98.4 F (36.9 C) (02/11 0446) Pulse Rate:  [91-109] 103 (02/11 0446) Resp:  [17-18] 17 (02/11 0446) BP: (110-124)/(67-76) 110/72 (02/11 0446) SpO2:  [98 %-100 %] 98 % (02/11 0446) Weight:  [52.3 kg] 52.3 kg (02/11 0500) Last BM Date: 05/04/21  Intake/Output from previous day: 02/10 0701 - 02/11 0700 In: 635.8 [I.V.:480; NG/GT:155.8] Out: -  Intake/Output this shift: No intake/output data recorded.  PE: General: resting comfortably, NAD Neuro: alert and oriented, no focal deficits Resp: normal work of breathing on room air Abdomen: soft, nondistended, nontender. Midline incision clean and dry. GJ tube in place, J tube feeds running at 31ml/hr. Extremities: warm and well-perfused   Lab Results:  Recent Labs    05/05/21 0951 05/06/21 0649  WBC 15.4* 8.4  HGB 10.0* 8.4*  HCT 28.9* 24.2*  PLT 271 218     BMET Recent Labs    05/05/21 0951 05/06/21 0649  NA 129* 131*  K 4.8 4.0  CL 95* 100  CO2 24 23  GLUCOSE 136* 127*  BUN 23 19  CREATININE 0.96 0.88  CALCIUM 8.4* 8.1*    PT/INR No results for input(s): LABPROT, INR in the last 72 hours.  CMP     Component Value Date/Time   NA 131 (L) 05/06/2021 0649   NA 133 (A) 03/31/2021 0000   K 4.0 05/06/2021 0649   CL 100 05/06/2021 0649   CO2 23 05/06/2021 0649   GLUCOSE 127 (H) 05/06/2021 0649   BUN 19 05/06/2021 0649   BUN 17 03/31/2021 0000   CREATININE 0.88 05/06/2021 0649   CALCIUM 8.1 (L) 05/06/2021 0649   PROT 5.3 (L) 05/05/2021 0951   ALBUMIN 3.2 (L) 05/05/2021 0951   AST 35 05/05/2021 0951   ALT 13 05/05/2021 0951    ALKPHOS 100 05/05/2021 0951   BILITOT 1.2 05/05/2021 0951   GFRNONAA >60 05/06/2021 0649   GFRAA >60 10/11/2016 1100   Lipase     Component Value Date/Time   LIPASE 21 04/28/2021 1305       Studies/Results: IR GJ Tube Change  Result Date: 05/05/2021 INDICATION: History of pancreatic cancer with complete obstruction of the horizontal segment of the duodenum, post gastrojejunostomy surgical bypass with placement of a feeding gastrojejunostomy tube. Unfortunately, the surgically placed gastrojejunostomy jejunal lumen is now coiled with the entirety within the gastric lumen. Patient now presents for attempted fluoroscopic guided replacement. EXAM: FLUOROSCOPIC GUIDED REPLACEMENT OF GASTROJEJUNOSTOMY TUBE COMPARISON:  CT abdomen pelvis-04/28/2021; abdominal radiograph-earlier same day MEDICATIONS: None. CONTRAST:  9mL OMNIPAQUE IOHEXOL 300 MG/ML SOLN administered into the gastric lumen and proximal small bowel. FLUOROSCOPY TIME:  11 minutes (36 seconds) COMPLICATIONS: None. PROCEDURE: Informed written consent was obtained from the patient after a discussion of the risks and benefits. The upper abdomen and the external portion of the existing gastrojejunostomy tube was prepped and draped in the usual sterile fashion, and a sterile drape was applied covering the operative field. Maximum barrier sterile technique with sterile gowns and gloves were used for the procedure. A timeout was performed prior to the initiation of the procedure. Both lumens  of the gastrojejunostomy catheter with injection confirming both were positioned within the gastric lumen. As such, the external portion of the surgically placed gastrojejunostomy tube was cut and the retention balloon was deflated. The gastrojejunostomy tube was partially retracted and then the jejunal lumen was cut and cannulated with a stiff Glidewire which was cannulated within the gastric lumen Under intermittent fluoroscopic guidance, the remainder of the [place]  tube was exchanged for a C2 catheter which was advanced through the gastric antrum to the level of the proximal duodenum. Attempts were made to traverse the obstructed portion of the horizontal segment of the duodenum however this ultimately proved unsuccessful. Next, the C2 catheter was utilized to select the surgical gastrojejunostomy. Contrast injection confirmed appropriate positioning Next, over a regular glidewire, the C2 catheter was exchanged for a 52 French balloon retention gastrojejunostomy catheter with tip ultimately terminating within the proximal/mid small bowel though there is some redundant jejunal lumen within the gastric fundus. The retention balloon was insufflated with approximately 10 cc of saline and dilute contrast. The external disc was cinched. Contrast injection confirmed appropriate positioning and functionality of both the gastric and jejunal lumens. A dressing was applied. The patient tolerated the procedure well without immediate postprocedural complication. IMPRESSION: Technically successful fluoroscopic guided replacement and repositioning 20 French balloon retention gastrojejunostomy catheter with weighted tip of the jejunal lumen terminating through the surgical gastrojejunostomy anastomosis within the proximal/mid small bowel though note is made of some redundant jejunal lumen coiled within the gastric fundus. Both lumens are ready for immediate usage. Electronically Signed   By: Sandi Mariscal M.D.   On: 05/05/2021 16:07   DG CHEST PORT 1 VIEW  Result Date: 05/05/2021 CLINICAL DATA:  Tachycardic this morning. Bilateral leg and right arm swelling. EXAM: PORTABLE CHEST 1 VIEW COMPARISON:  AP chest 04/11/2021 FINDINGS: Cardiac silhouette is again at the upper limits of normal size. Mediastinal contours are within normal limits with calcification seen within the aortic arch. New opacity within the right lung mid height mid to lateral transverse location. The left lung is clear. No  definite pleural effusion. No pneumothorax. Cholecystectomy clips. Bilateral catheter tubing overlies the partially visualized kidneys. Moderate multilevel degenerative disc changes. IMPRESSION: New right mid lung heterogeneous airspace opacity concerning for pneumonia. Electronically Signed   By: Yvonne Kendall M.D.   On: 05/05/2021 10:18   DG Abd Portable 1V  Result Date: 05/05/2021 CLINICAL DATA:  Severe abdominal pain, left-greater-than-right. Decreased urine output. Peg tube is to be adjusted later today. EXAM: PORTABLE ABDOMEN - 1 VIEW COMPARISON:  CT abdomen and pelvis and KUB 04/28/2021 FINDINGS: Percutaneous gastrojejunostomy tube is again noted. The distal tip is horizontal in orientation and may overlie the third portion of the duodenum, however this appears close to portions of the tubing extending along likely the greater curvature of the stomach not exclude that the tip could be also overlying the stomach. Nonobstructive bowel-gas pattern. Air is seen within the colon and rectum. No portal venous gas or pneumatosis. The lung bases are clear. Mild multilevel degenerative disc changes of the thoracic spine. IMPRESSION:: IMPRESSION: 1. Nonobstructed bowel-gas pattern. 2. Gastrojejunostomy tube is again noted. It is difficult to determine whether the distal tip is in the third portion of the duodenum versus the stomach. The tip was within the third portion of the duodenum on recent 04/28/2021 CT. Electronically Signed   By: Yvonne Kendall M.D.   On: 05/05/2021 10:25    Anti-infectives: Anti-infectives (From admission, onward)    Start  Dose/Rate Route Frequency Ordered Stop   05/05/21 1600  ceFEPIme (MAXIPIME) 2 g in sodium chloride 0.9 % 100 mL IVPB        2 g 200 mL/hr over 30 Minutes Intravenous Every 12 hours 05/05/21 1446     05/05/21 1600  metroNIDAZOLE (FLAGYL) IVPB 500 mg        500 mg 100 mL/hr over 60 Minutes Intravenous Every 12 hours 05/05/21 1446           Assessment/Plan 76 yo female with a locally advanced cancer of the body of the pancreas causing duodenal obstruction and left hydronephrosis. Now 4 weeks s/p gastrojejunal bypass with placement of feeding GJ tube. GJ tube placed 2/9. - tolerating J tube feeds at 35 ml/hr with orders to advance to 45 goal. Consider cycling to nocturnal feeds if tolerating  - Continue venting G tube prn - Palliative care consulted for symptom management - she did not wish to speak with them yesterday but discussed the benefit of talking with them today - advance to soft diet - patient can have outside food though she does not think she will try this - On cefepime for HCAP - VTE: lovenox, SCDs - Ambulate TID - Dispo: inpatient, med-surg floor.    LOS: 9 days    [facility] 05/07/2021, 8:03 AM Please see Amion for pager number during day hours 7:00am-4:30pm

## 2021-05-07 NOTE — Consult Note (Signed)
Palliative Care Consult Note                                  Date: 05/07/2021   Patient Name: Leah Pruitt  DOB: December 05, 1945  MRN: [id number]  Age / Sex: 76 y.o., female  PCP: Street, [name], MD Referring Physician: Dwan Bolt, MD  Reason for Consultation: Establishing goals of care  HPI/Patient Profile: 76 y.o. female  with past medical history of hypertension, diabetes mellitus type 2, GERD, and pancreatic mass s/p exploratory laparotomy with gastrojejunostomy tube placement on 04/08/21 by general surgery Dr. Zenia Resides for duodenal obstruction presented with complaints of being unable to tolerate oral intake due to nausea, vomiting, abdominal distention, and diffuse abdominal pain. She was admitted on 04/28/2021 with intractable nausea/vomiting, pancreatic cancer, history of GJ bypass, sepsis, AKI, and others.   Since then she had her GJ tube repositioned by IR and is now receiving tube feeds without further nausea and vomiting.  AKI and sepsis appear resolved.  PMT was consulted for goals of care conversation.  Past Medical History:  Diagnosis Date   Appendicitis, acute 11/19/2012   Arrhythmia    Arthritis    Back pain    Cardiac arrhythmia    Colles' fracture of right radius, init for clos fx 07/09/2017   Diabetes ([place])    Diverticulosis    FH: cholecystectomy    Gastroesophageal reflux disease    GERD (gastroesophageal reflux disease) 11/19/2012   Headache    HTN (hypertension)    Lipoma    Scalp   Migraines    Osteoporosis    Pain in right wrist 07/09/2017   Unspecified essential hypertension 11/19/2012    Subjective:   This NP Walden Field reviewed medical records, received report from team, assessed the patient and then meet at the patient's bedside to discuss diagnosis, prognosis, GOC, EOL wishes disposition and options.  I met with the patient and her husband at the bedside.  They refused use of a translator  and prefers the husband to translate.   Concept of Palliative Care was introduced as specialized medical care for people and their families living with serious illness.  If focuses on providing relief from the symptoms and stress of a serious illness.  The goal is to improve quality of life for both the patient and the family. Values and goals of care important to patient and family were attempted to be elicited.  Created space and opportunity for patient  and family to explore thoughts and feelings regarding current medical situation   Natural trajectory and current clinical status were discussed. Questions and concerns addressed. Patient  encouraged to call with questions or concerns.    Patient/Family Understanding of Illness: They understand there has been a lot of changes.  She has pancreatic cancer and has had physical changes but also worried about mental and psychological impact of her diagnosis.  They both have a lot of concern about her health.  They verbalized significant need of support.  They understand her big problem right now is nutrition and weakness and are hoping now with G-tube replacement that she can get stronger to be able to tolerate treatment.  Life Review: She lives with her husband alone.  They have been married for 54 years.  He states "if she goes then I go because there will be nothing left for me anymore."  Patient Values: Faith, family, staying alive  Goals:  To get better/stronger with better nutrition, increase strength, pursue all available medical treatment of her cancer  Today's Discussion: I had an extensive discussion with the patient and her husband.  There is clearly a lot of stress related to their current situation.  They are both quite overwhelmed.  Husband has an essential tremor that required him to take time off of work and his job fired him after "maximum amount of disability required by law" was provided and so he no longer has a job.  They only  bring in 1200 hrs. a month so security.  There is financial strain as bills are piling up.  They were able to get temporary relief from property taxes from the county.  However, utilities and other bills continues to be an issue.  He shares that he has medications that he has not been able to fill because they cannot afford it.  I offered to involve social work/case management to help identify resources that may be able to help them.  During this conversation the patient became tearful and the husband was very sweet and consoled her.  We further discussed the patient's current clinical status.  She is hoping to be able to go home soon.  Her pain is much better controlled and her nausea is no longer a problem.  Her complaint today is that she does have some trouble sleeping at night and ends up sleeping through the day.  I offered Xanax at bedtime as needed to help with sleep and they have accepted.   We discussed their goals and wishes moving forward.  We discussed CODE STATUS as well.  In summary of this discussion they want to be a full code and accept all available medical treatments.  They want to do "what ever is needed to help her survive."  I expressed my understanding and support of their wishes.  I offered outpatient palliative care consult to assist with stress and symptoms related to her diagnosis and have excepted.  I provided emotional general supportive therapeutic listening, empathy, sharing stories, therapeutic silence, handholding, and other techniques.  I answered all questions and addressed all concerns to the best of my ability.  Review of Systems  Constitutional:        Generally pain is improved  Respiratory:  Negative for cough, chest tightness and shortness of breath.   Cardiovascular:  Negative for chest pain.  Gastrointestinal:  Negative for nausea (improved) and vomiting.   Objective:   Primary Diagnoses: Present on Admission:  Pancreatic cancer ([place])  Gastro-esophageal  reflux disease without esophagitis  Essential (primary) hypertension  Small bowel obstruction secondary to pancreatic cancer  Protein-calorie malnutrition, severe   Physical Exam Vitals and nursing note reviewed.  Constitutional:      General: She is not in acute distress.    Appearance: She is ill-appearing. She is not toxic-appearing.  HENT:     Head: Normocephalic and atraumatic.  Cardiovascular:     Rate and Rhythm: Normal rate.  Pulmonary:     Effort: Pulmonary effort is normal. No respiratory distress.  Abdominal:     Palpations: Abdomen is soft.  Skin:    General: Skin is warm and dry.  Neurological:     General: No focal deficit present.     Mental Status: She is alert.  Psychiatric:        Mood and Affect: Affect is tearful.  Fine  Vital Signs:  BP 119/86 (BP Location: Left Arm)    Pulse (!) 107  Temp 98.1 F (36.7 C) (Oral)    Resp 16    Ht '5\' 1"'  (1.549 m)    Wt 52.3 kg    SpO2 98%    BMI 21.79 kg/m   Palliative Assessment/Data: 50-60%    Advanced Care Planning:   Primary Decision Maker: PATIENT  Code Status/Advance Care Planning: Full code  A discussion was had today regarding advanced directives. Concepts specific to code status, artifical feeding and hydration, continued IV antibiotics and rehospitalization was had.  The difference between a aggressive medical intervention path and a palliative comfort care path for this patient at this time was had.  At this time they desire all available medical treatments including full code.  Decisions/Changes to ACP: Remain full code Willing to accept all available treatments  Assessment & Plan:   Impression: 76 year old female who presented with intractable abdominal pain, nausea, vomiting due to malpositioned GJ tube which has now been repositioned and resolution of symptoms.  Also with sepsis and AKI, also resolved.  She is receiving tube feeds to help strengthen and improve her nutrition.  Confirmation of  pancreatic cancer by biopsy during laparotomy for GJ tube placement.  Due for follow-up with the cancer center to discuss treatment options.  They are wanting all available treatments including cancer treatment in order to "live as long as possible."  Overall prognosis is guarded.  SUMMARY OF RECOMMENDATIONS   Remain full code Accepting all available medical treatments Coordination with TOC/social work to help identify resources given the patient's financial situation Add Xanax 0.25 mg oral as needed nightly for sleep Consult for outpatient palliative care PMT will follow peripherally now that goals are clear Please contact us if we can be of further assistance  Symptom Management:  Per primary team PMT is available to assist as needed  Prognosis:  Unable to determine  Discharge Planning:  Home with outpatient palliative care    Discussed with: Medical team, nursing team, patient, patient's family, TOC team    Thank you for allowing [place] to participate in the care of [facility] PMT will continue to support holistically.  Time Total: 120 min  Greater than 50%  of this time was spent counseling and coordinating care related to the above assessment and plan.  Signed by: Walden Field, NP Palliative Medicine Team  Team Phone # [phone] (Nights/Weekends)  05/07/2021, 3:24 PM

## 2021-05-07 NOTE — Progress Notes (Signed)
Physical Therapy Discharge Patient Details Name: Leah Pruitt MRN: [id number] DOB: [birth year] Today's Date: 05/07/2021 Time:  -     Patient discharged from PT services secondary to  pt and family refuse .  Please see latest therapy progress note for current level of functioning and progress toward goals.    Progress and discharge plan discussed with patient and/or caregiver: Patient/Caregiver agrees with plan  GP    Danil Wedge A. Martell Mcfadyen, PT, DPT Acute Rehabilitation Services Office: [place] 05/07/2021, 10:11 AM

## 2021-05-07 NOTE — Progress Notes (Signed)
On 2  PROGRESS NOTE  [name] MAU:[id number] DOB: Nov 20, 1945 DOA: 04/28/2021 PCP: Emmaline Kluver, MD  HPI/Recap of past 24 hours: Leah Pruitt is a 76 y.o. female with past medical history of hypertension, diabetes mellitus type 2, GERD, and pancreatic mass s/p exploratory laparotomy with gastrojejunostomy tube placement on 04/08/21 by general surgery Dr. Zenia Resides for duodenal obstruction presented with complaints of being unable to tolerate oral intake due to nausea, vomiting, abdominal distention, and diffuse abdominal pain.  Previously she notes she had been on TPN, but had discomfort from the PICC line in her arm and states that it made her blood sugars elevated.  Furthermore, she does not want the nasogastric tube placed either.  She would like the Amagon tube that was placed to be able to be used.  Patient was admitted under general surgery service and hospitalist team was consulted for medical co-management of hypertension and type 2 diabetes.  Hospital course complicated by severe abdominal pain, refractory to high doses of IV Dilaudid.  Palliative care team was consulted by general surgery to assist with the management of her intractable abdominal pain.  Post GJ tube evaluation and manipulation by IR on 05/05/2021.  Met sepsis criteria with leukocytosis, tachycardia, right middle lobe infiltrates suggestive of pneumonia for which she was started on cefepime and IV Flagyl.  Responded well to IV antibiotics.  Leukocytosis has resolved, afebrile.  Switched IV antibiotics to Cefdinir on 05/07/21.  TOC assisting with DC planning.  Patient will continue tube feeding at home.  Prior to admission, at home, she was taking tube feeding at night and eating a soft diet during the day.  Prealbumin level severely low at 5.5 on 05/06/21 from 23.9 on 04/29/2021.  Diet advanced to soft diet from full liquid diet on 05/07/2021.  05/07/2021: Seen and examined at her bedside.  Her pain is well controlled.  She has no  new complaints.  Lost IV access overnight.  Medications switched to oral.  Assessment/Plan: Principal Problem:   Small bowel obstruction secondary to pancreatic cancer Active Problems:   Essential (primary) hypertension   Gastro-esophageal reflux disease without esophagitis   Type 2 diabetes mellitus without complication (HCC)   Protein-calorie malnutrition, severe   Pancreatic cancer (HCC)   Leukocytosis   Hyponatremia  Resolved intractable nausea/vomiting/history of ex laparotomy, biopsy of pancreatic mass, GJ bypass with placement of GJ tube, post GJ tube evaluation and manipulation by IR on 05/05/2021:  Management per primary team General surgery. Pain management per palliative care team, consulted by primary team.  Post GJ tube evaluation and manipulation by IR on 05/05/2021. Abdominal x-ray done on 05/05/2021, showing nonobstructive bowel gas pattern.  Gastrojejunostomy tube in place. Diet advanced by general surgery. Nausea has resolved and she is requesting a regular consistency diet. IV antiemetics as needed Appreciate dietitian and TOC's assistance.  Abdominal pain in the setting of above. Tramadol 50 mg twice daily Oxycodone as needed for moderate IV Dilaudid as needed for severe pain Bowel regimen Senokot 2 tablets twice daily  Resolving sepsis secondary to right middle lobe pneumonia, possibly present on admission, with concern for aspiration Personally reviewed chest x-ray done on 05/05/2021.  It shows right middle lobe infiltrates suggestive of pneumonia. Possible aspiration from intractable vomiting, suspect present on admission. Leukocytosis resolved.  Afebrile. On 2/9/she had worsening leukocytosis, tachycardia, elevated CRP Blood cultures x2 peripherally, negative to date. Cefepime and IV Flagyl, started on 05/05/2021, DC'd on 05/07/2021. Cefdinir 300 mg twice daily x3 days started on 05/07/2021.  Resolved sinus tachycardia, suspect driven by sepsis TSH 0.673, within  normal limit. Received gentle IV fluid hydration LR 50 cc/h x 2 days, stop on 05/07/2021. Follow blood cultures, sputum culture, negative to date. Continue to treat underlying conditions  Resolved post repletion: Hypomagnesemia Serum magnesium 1.5> 1.8.  Improving, post IV fluid hydration, hypovolemic hyponatremia Serum sodium 129> 131 TSH within normal limit, 0.673. Continue gentle IV fluid hydration LR 50 cc/h x 1 day\.  Acute blood loss anemia/chronic normocytic anemia Could be contributed by IV fluid, dilutional. Hemoglobin 8.4 from 10.0. No overt bleeding. Continue to monitor repeat CBC in the morning.  Prediabetes with hyperglycemia, hypoglycemia is improved:  Hemoglobin A1c 5.9 on 04/30/2021. Insulin sliding scale Avoid hypoglycemia.   Questionable hypertension:  Hypertension is mentioned in the H&P.  I am not really sure if she has the history are not as she does not seem to be on any medications and yet her blood pressures controlled.   BP is normotensive.   Resolved AKI, likely prerenal in the setting of dehydration from poor oral intake. Baseline creatinine appears to be 0.8 with GFR greater than 60 Presented with creatinine of 1.4 She is back to her baseline creatinine 0.8 with GFR greater than 60. Continue to encourage increase in oral protein calorie intake.  Severe protein calorie malnutrition Moderate muscle mass loss Severe hypoalbuminemia with prealbumin 5.5 on 05/06/2021. Management per dietitian. Continue soft diet and cyclic PEG tube feedings   Goals of care Palliative care team following, consulted by primary team General surgery. Patient is full code. TOC assisting with discharge planning.  Patient will require tube feeding at discharge as well as home health RN, aide, CSW, face-to-face completed.  Thank you for allowing [place] to participate in the care of this patient.  We will continue to follow along with you.    DVT prophylaxis: Subcu Lovenox  daily.    Code Status: Full Code   Family Communication: Husband of 2 years present at bedside.     Objective: Vitals:   05/06/21 2351 05/07/21 0446 05/07/21 0500 05/07/21 0818  BP: 123/68 110/72  135/81  Pulse: (!) 109 (!) 103  (!) 106  Resp: _0 Temp: 98.2 F (36.8 C) 98.4 F (36.9 C)  98.5 F (36.9 C)  TempSrc: Oral     SpO2: 99% 98%  98%  Weight:   52.3 kg   Height:        Intake/Output Summary (Last 24 hours) at 05/07/2021 1132 Last data filed at 05/06/2021 1600 Gross per 24 hour  Intake 276 ml  Output --  Net 276 ml   Filed Weights   04/28/21 1255 05/06/21 0420 05/07/21 0500  Weight: 50.8 kg 51.1 kg 52.3 kg    Exam:  General: 76 y.o. year-old female frail-appearing in no acute distress.  She is alert and oriented x3. Cardiovascular: Regular rate and rhythm no rubs or gallops.   Respiratory: Clear to auscultation with no wheezes or rales.   Abdomen: GJ tube in place.  Bowel sounds present. Musculoskeletal: Trace lower extremity edema bilaterally. Skin: No ulcerative lesions noted. Psychiatry: Mood is appropriate for condition 7. Neuro exam: Moves all 4 extremities.  Nonfocal exam.   Data Reviewed: CBC: Recent Labs  Lab 05/05/21 0951 05/06/21 0649  WBC 15.4* 8.4  NEUTROABS 13.6*  --   HGB 10.0* 8.4*  HCT 28.9* 24.2*  MCV 85.8 86.7  PLT 271 505   Basic Metabolic Panel: Recent Labs  Lab 05/01/21 0059  05/02/21 0118 05/04/21 0357 05/05/21 0749 05/05/21 0951 05/06/21 0649  NA 133* 129* 131*  --  129* 131*  K 3.5 3.7 4.1  --  4.8 4.0  CL 103 100 96*  --  95* 100  CO2 19* 23 25  --  24 23  GLUCOSE 131* 108* 125*  --  136* 127*  BUN _0 --  23 19  CREATININE 0.97 0.92 0.88 1.01* 0.96 0.88  CALCIUM 8.6* 8.5* 8.7*  --  8.4* 8.1*  MG  --   --   --   --  1.5* 1.8  PHOS  --   --   --   --  4.2  --    GFR: Estimated Creatinine Clearance: 42.3 mL/min (by C-G formula based on SCr of 0.88 mg/dL). Liver Function Tests: Recent Labs   Lab 05/05/21 0951  AST 35  ALT 13  ALKPHOS 100  BILITOT 1.2  PROT 5.3*  ALBUMIN 3.2*   No results for input(s): LIPASE, AMYLASE in the last 168 hours.  No results for input(s): AMMONIA in the last 168 hours. Coagulation Profile: No results for input(s): INR, PROTIME in the last 168 hours.  Cardiac Enzymes: No results for input(s): CKTOTAL, CKMB, CKMBINDEX, TROPONINI in the last 168 hours. BNP (last 3 results) No results for input(s): PROBNP in the last 8760 hours. HbA1C: No results for input(s): HGBA1C in the last 72 hours. CBG: Recent Labs  Lab 05/06/21 0738 05/06/21 1118 05/06/21 1605 05/06/21 2159 05/07/21 0819  GLUCAP 136* 106* 174* 120* 168*   Lipid Profile: No results for input(s): CHOL, HDL, LDLCALC, TRIG, CHOLHDL, LDLDIRECT in the last 72 hours. Thyroid Function Tests: Recent Labs    05/05/21 1637  TSH 0.673   Anemia Panel: No results for input(s): VITAMINB12, FOLATE, FERRITIN, TIBC, IRON, RETICCTPCT in the last 72 hours. Urine analysis:    Component Value Date/Time   COLORURINE COLORLESS (A) 03/22/2021 1851   APPEARANCEUR CLEAR 03/22/2021 1851   LABSPEC 1.005 03/22/2021 1851   PHURINE 9.0 (H) 03/22/2021 1851   GLUCOSEU NEGATIVE 03/22/2021 1851   HGBUR NEGATIVE 03/22/2021 1851   BILIRUBINUR NEGATIVE 03/22/2021 1851   KETONESUR NEGATIVE 03/22/2021 1851   PROTEINUR NEGATIVE 03/22/2021 1851   UROBILINOGEN 0.2 11/15/2012 2157   NITRITE NEGATIVE 03/22/2021 1851   LEUKOCYTESUR NEGATIVE 03/22/2021 1851   Sepsis Labs: _1 (procalcitonin:4,lacticidven:4)  ) Recent Results (from the past 240 hour(s))  Blood Culture (routine x 2)     Status: None   Collection Time: 04/28/21 12:38 PM   Specimen: BLOOD  Result Value Ref Range Status   Specimen Description BLOOD LEFT ANTECUBITAL  Final   Special Requests   Final    BOTTLES DRAWN AEROBIC AND ANAEROBIC Blood Culture results may not be optimal due to an inadequate volume of blood received in culture  bottles   Culture   Final    NO GROWTH 5 DAYS Performed at [facility], [place] [address], [place], [place] [address]    Report Status 05/03/2021 FINAL  Final  Resp Panel by RT-PCR (Flu A&B, Covid) Nasopharyngeal Swab     Status: None   Collection Time: 04/28/21 12:38 PM   Specimen: Nasopharyngeal Swab; Nasopharyngeal(NP) swabs in vial transport medium  Result Value Ref Range Status   SARS Coronavirus 2 by RT PCR NEGATIVE NEGATIVE Final    Comment: (NOTE) SARS-CoV-2 target nucleic acids are NOT DETECTED.  The SARS-CoV-2 RNA is generally detectable in upper respiratory specimens during the acute phase of  infection. The lowest concentration of SARS-CoV-2 viral copies this assay can detect is 138 copies/mL. A negative result does not preclude SARS-Cov-2 infection and should not be used as the sole basis for treatment or other patient management decisions. A negative result may occur with  improper specimen collection/handling, submission of specimen other than nasopharyngeal swab, presence of viral mutation(s) within the areas targeted by this assay, and inadequate number of viral copies(<138 copies/mL). A negative result must be combined with clinical observations, patient history, and epidemiological information. The expected result is Negative.  Fact Sheet for Patients:  EntrepreneurPulse.com.au  Fact Sheet for Healthcare Providers:  IncredibleEmployment.be  This test is no t yet approved or cleared by the [place] FDA and  has been authorized for detection and/or diagnosis of SARS-CoV-2 by FDA under an Emergency Use Authorization (EUA). This EUA will remain  in effect (meaning this test can be used) for the duration of the COVID-19 declaration under Section 564(b)(1) of the Act, 21 U.S.C.section 360bbb-3(b)(1), unless the authorization is terminated  or revoked sooner.       Influenza A by PCR NEGATIVE NEGATIVE Final   Influenza  B by PCR NEGATIVE NEGATIVE Final    Comment: (NOTE) The Xpert Xpress SARS-CoV-2/FLU/RSV plus assay is intended as an aid in the diagnosis of influenza from Nasopharyngeal swab specimens and should not be used as a sole basis for treatment. Nasal washings and aspirates are unacceptable for Xpert Xpress SARS-CoV-2/FLU/RSV testing.  Fact Sheet for Patients: EntrepreneurPulse.com.au  Fact Sheet for Healthcare Providers: IncredibleEmployment.be  This test is not yet approved or cleared by the [place] FDA and has been authorized for detection and/or diagnosis of SARS-CoV-2 by FDA under an Emergency Use Authorization (EUA). This EUA will remain in effect (meaning this test can be used) for the duration of the COVID-19 declaration under Section 564(b)(1) of the Act, 21 U.S.C. section 360bbb-3(b)(1), unless the authorization is terminated or revoked.  Performed at [facility], [place] [address], [place], [place] [address]   Blood Culture (routine x 2)     Status: None   Collection Time: 04/28/21  1:29 PM   Specimen: BLOOD  Result Value Ref Range Status   Specimen Description BLOOD LEFT ANTECUBITAL  Final   Special Requests   Final    BOTTLES DRAWN AEROBIC AND ANAEROBIC Blood Culture adequate volume   Culture   Final    NO GROWTH 5 DAYS Performed at [facility], [place] [address] [place], [place] [address]    Report Status 05/03/2021 FINAL  Final  Culture, blood (routine x 2)     Status: None (Preliminary result)   Collection Time: 05/05/21  4:36 PM   Specimen: BLOOD  Result Value Ref Range Status   Specimen Description BLOOD RIGHT ANTECUBITAL  Final   Special Requests   Final    BOTTLES DRAWN AEROBIC AND ANAEROBIC Blood Culture adequate volume   Culture   Final    NO GROWTH 2 DAYS Performed at [facility], [place] [address], [place], [place] [address]    Report Status PENDING  Incomplete  Culture, blood (routine x 2)      Status: None (Preliminary result)   Collection Time: 05/05/21  4:37 PM   Specimen: BLOOD  Result Value Ref Range Status   Specimen Description BLOOD RIGHT ANTECUBITAL  Final   Special Requests   Final    BOTTLES DRAWN AEROBIC AND ANAEROBIC Blood Culture adequate volume   Culture   Final  NO GROWTH 2 DAYS Performed at [facility], [place] [address] [place], [place] [address]    Report Status PENDING  Incomplete      Studies: No results found.  Scheduled Meds:  cefdinir  300 mg Oral Q12H   docusate sodium  100 mg Oral BID   enoxaparin (LOVENOX) injection  40 mg Subcutaneous Q24H   feeding supplement  237 mL Oral TID BM   feeding supplement (OSMOLITE 1.5 CAL)  1,000 mL Per Tube Q24H   feeding supplement (PROSource TF)  45 mL Per Tube BID   insulin aspart  0-9 Units Subcutaneous TID WC   metoCLOPramide (REGLAN) injection  5 mg Intravenous Q6H   pantoprazole  40 mg Oral BID   senna-docusate  2 tablet Oral BID   traMADol  50 mg Oral BID    Continuous Infusions:     LOS: 9 days     Kayleen Memos, MD [facility] Pager [phone]  If 7PM-7AM, please contact night-coverage [email or web address] Password [facility] 05/07/2021, 11:32 AM

## 2021-05-08 ENCOUNTER — Inpatient Hospital Stay (HOSPITAL_COMMUNITY): Payer: Medicare Other

## 2021-05-08 DIAGNOSIS — K56609 Unspecified intestinal obstruction, unspecified as to partial versus complete obstruction: Secondary | ICD-10-CM | POA: Diagnosis not present

## 2021-05-08 LAB — GLUCOSE, CAPILLARY
Glucose-Capillary: 150 mg/dL — ABNORMAL HIGH (ref 70–99)
Glucose-Capillary: 172 mg/dL — ABNORMAL HIGH (ref 70–99)

## 2021-05-08 IMAGING — DX DG ABD PORTABLE 1V
2 series · 2 of 2 positions shown · non-contrast
Comparison: [DATE]

CLINICAL DATA: Feeding tube.

EXAM:
PORTABLE ABDOMEN - 1 VIEW

[abdomen supine (1 of 2)]
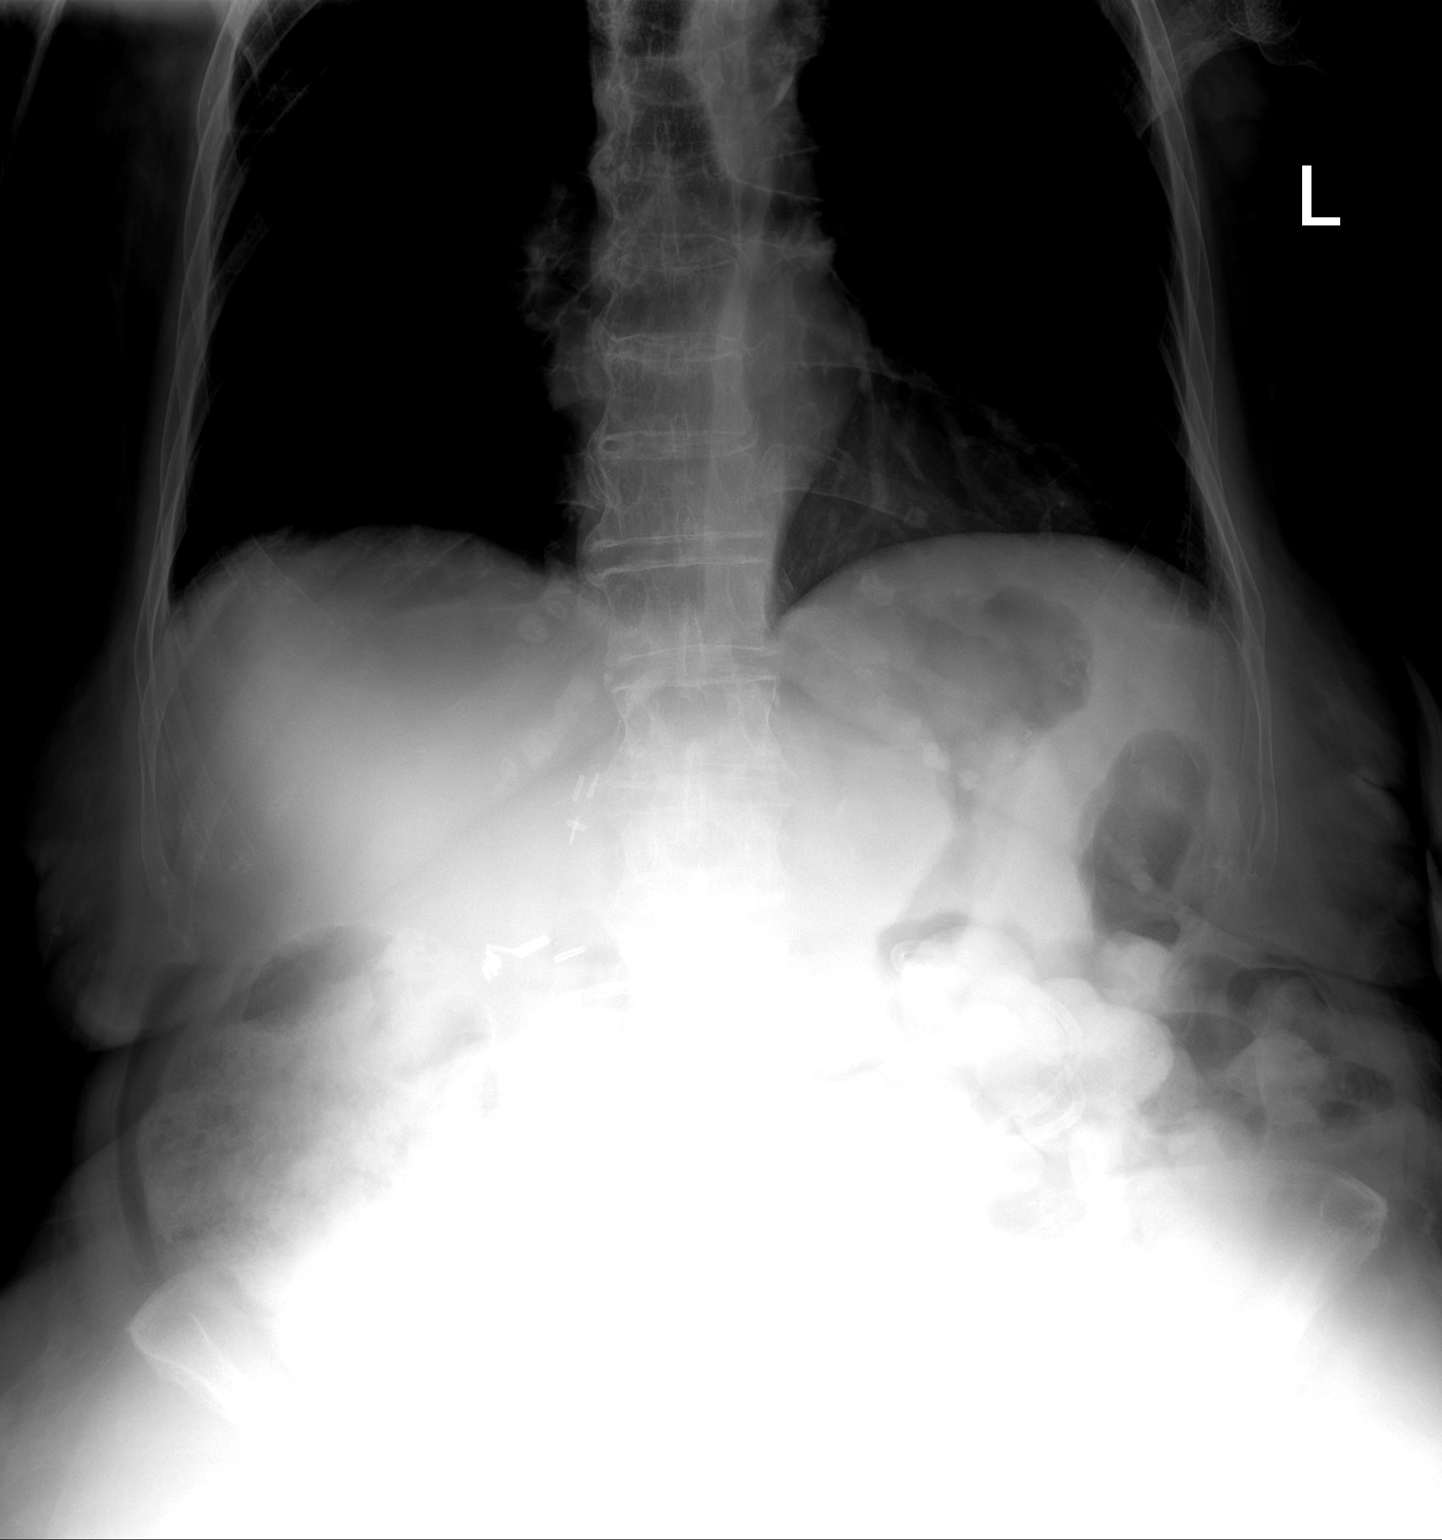

[abdomen supine (2 of 2)]
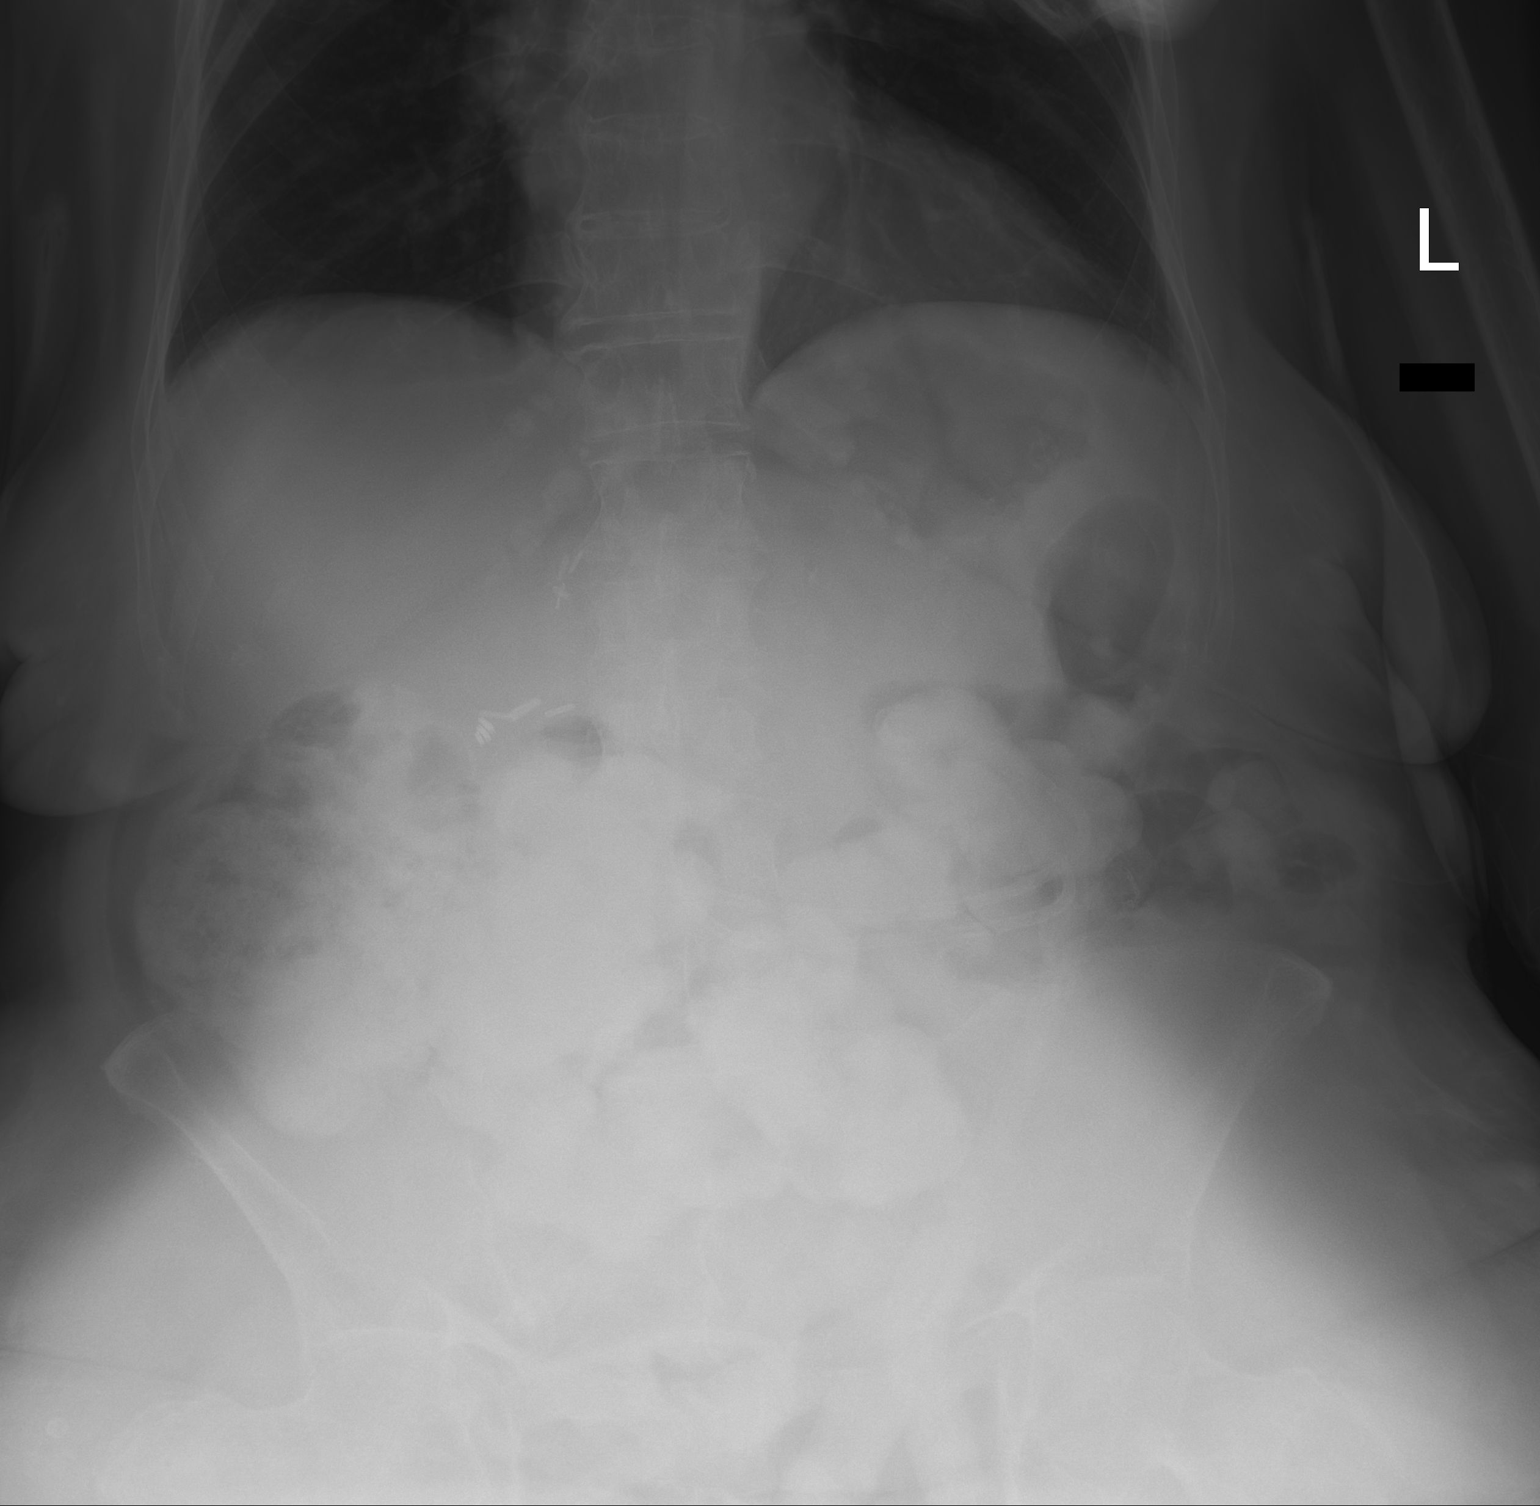

[2 of 2 positions shown; findings below may reference images not displayed]

FINDINGS: Feeding tube is the scattered in some regions by contrast material
in the bowel. The tip of the catheter cannot be confidently
identified. Bowel gas pattern is nonspecific.
IMPRESSION: Feeding tube tip cannot be confidently identified on this study due
to contrast material within bowel loops.

## 2021-05-08 MED ORDER — HYDROXYZINE HCL 10 MG PO TABS: 10.0000 mg | ORAL_TABLET | Freq: Three times a day (TID) | ORAL | 0 refills | Status: AC | PRN

## 2021-05-08 MED ORDER — TRAMADOL HCL 50 MG PO TABS: 50.0000 mg | ORAL_TABLET | Freq: Four times a day (QID) | ORAL | 0 refills | Status: AC | PRN

## 2021-05-08 MED ORDER — ONDANSETRON 4 MG PO TBDP: 4.0000 mg | ORAL_TABLET | Freq: Three times a day (TID) | ORAL | 0 refills | Status: DC | PRN

## 2021-05-08 MED ORDER — EYE WASH OPHTH SOLN
1.0000 [drp] | OPHTHALMIC | Status: DC | PRN
  Filled 2021-05-08: qty 118

## 2021-05-08 MED ORDER — CEFDINIR 300 MG PO CAPS: 300.0000 mg | ORAL_CAPSULE | Freq: Two times a day (BID) | ORAL | 0 refills | Status: AC

## 2021-05-08 MED ORDER — OXYCODONE HCL 5 MG PO TABS: 5.0000 mg | ORAL_TABLET | Freq: Four times a day (QID) | ORAL | 0 refills | Status: AC | PRN

## 2021-05-08 MED ORDER — PROSOURCE TF PO LIQD: 45.0000 mL | Freq: Two times a day (BID) | ORAL | 0 refills | Status: AC

## 2021-05-08 MED ORDER — OSMOLITE 1.5 CAL PO LIQD: 1000.0000 mL | ORAL | 2 refills | Status: DC

## 2021-05-08 MED ORDER — SENNOSIDES-DOCUSATE SODIUM 8.6-50 MG PO TABS: 2.0000 | ORAL_TABLET | Freq: Every day | ORAL | 0 refills | Status: AC

## 2021-05-08 MED ORDER — POLYETHYLENE GLYCOL 3350 17 G PO PACK: 17.0000 g | PACK | Freq: Every day | ORAL | 0 refills | Status: DC | PRN

## 2021-05-08 MED ORDER — ENSURE ENLIVE PO LIQD: 237.0000 mL | Freq: Three times a day (TID) | ORAL | 0 refills | Status: AC

## 2021-05-08 NOTE — Progress Notes (Signed)
Patient refused glucose check.

## 2021-05-08 NOTE — Progress Notes (Signed)
Subjective: Tried some soft foods yesterday without abdominal pain but did have nausea overnight - she vented G tube with large amount of what looks like tube feeds out. She is not sleeping well at night but does not want to take xanax as prescribed by palliative because she is worried about addiction and does not want to "take another pill." She is frustrated this morning and states repeatedly that she wants to go home today  Objective: Vital signs in last 24 hours: Temp:  [97.5 F (36.4 C)-98.5 F (36.9 C)] 97.5 F (36.4 C) (02/12 0738) Pulse Rate:  [90-109] 106 (02/12 0738) Resp:  [16-19] 18 (02/12 0738) BP: (113-135)/(68-88) 130/84 (02/12 0738) SpO2:  [98 %-100 %] 100 % (02/12 0738) Weight:  [51.2 kg] 51.2 kg (02/12 0500) Last BM Date: 05/04/21  Intake/Output from previous day: 02/11 0701 - 02/12 0700 In: 240 [P.O.:240] Out: -  Intake/Output this shift: No intake/output data recorded.  PE: General: resting comfortably, NAD Neuro: alert and oriented, no focal deficits Resp: normal work of breathing on room air Abdomen: soft, nondistended, nontender. Midline incision clean and dry. GJ tube in place, J tube feeds running at 45 ml/hr. Extremities: warm and well-perfused   Lab Results:  Recent Labs    05/05/21 0951 05/06/21 0649  WBC 15.4* 8.4  HGB 10.0* 8.4*  HCT 28.9* 24.2*  PLT 271 218     BMET Recent Labs    05/05/21 0951 05/06/21 0649  NA 129* 131*  K 4.8 4.0  CL 95* 100  CO2 24 23  GLUCOSE 136* 127*  BUN 23 19  CREATININE 0.96 0.88  CALCIUM 8.4* 8.1*    PT/INR No results for input(s): LABPROT, INR in the last 72 hours.  CMP     Component Value Date/Time   NA 131 (L) 05/06/2021 0649   NA 133 (A) 03/31/2021 0000   K 4.0 05/06/2021 0649   CL 100 05/06/2021 0649   CO2 23 05/06/2021 0649   GLUCOSE 127 (H) 05/06/2021 0649   BUN 19 05/06/2021 0649   BUN 17 03/31/2021 0000   CREATININE 0.88 05/06/2021 0649   CALCIUM 8.1 (L) 05/06/2021  0649   PROT 5.3 (L) 05/05/2021 0951   ALBUMIN 3.2 (L) 05/05/2021 0951   AST 35 05/05/2021 0951   ALT 13 05/05/2021 0951   ALKPHOS 100 05/05/2021 0951   BILITOT 1.2 05/05/2021 0951   GFRNONAA >60 05/06/2021 0649   GFRAA >60 10/11/2016 1100   Lipase     Component Value Date/Time   LIPASE 21 04/28/2021 1305       Studies/Results: No results found.  Anti-infectives: Anti-infectives (From admission, onward)    Start     Dose/Rate Route Frequency Ordered Stop   05/07/21 1015  cefdinir (OMNICEF) capsule 300 mg        300 mg Oral Every 12 hours 05/07/21 0928 05/10/21 0759   05/05/21 1600  ceFEPIme (MAXIPIME) 2 g in sodium chloride 0.9 % 100 mL IVPB  Status:  Discontinued        2 g 200 mL/hr over 30 Minutes Intravenous Every 12 hours 05/05/21 1446 05/07/21 0928   05/05/21 1600  metroNIDAZOLE (FLAGYL) IVPB 500 mg  Status:  Discontinued        500 mg 100 mL/hr over 60 Minutes Intravenous Every 12 hours 05/05/21 1446 05/07/21 0930        Assessment/Plan 76 yo female with a locally advanced cancer of the body of the pancreas causing  duodenal obstruction and left hydronephrosis. Now 4 weeks s/p gastrojejunal bypass with placement of feeding GJ tube. GJ tube placed 2/9. -tube feeds at 45 ml/hr goal. Large amount of output with venting G tube - xray today to check GJ tube placement - Continue venting G tube prn - Palliative care consulted - appreciate their assistance. TPC consulted and xanax for sleep ordered. Cont palliative care outpatient - was on cefepime and now cefdinir for HCAP - VTE: lovenox, SCDs - Ambulate TID - Dispo: inpatient, med-surg floor. Patient is very insistent on going home today and we discussed rationale for staying and further imaging    LOS: 10 days    Winferd Humphrey, [facility] 05/08/2021, 7:49 AM Please see Amion for pager number during day hours 7:00am-4:30pm

## 2021-05-08 NOTE — Progress Notes (Signed)
On 2  PROGRESS NOTE  Leah Pruitt HLK:[id number] DOB: 1945/10/21 DOA: 04/28/2021 PCP: Emmaline Kluver, MD  HPI/Recap of past 24 hours: Leah Pruitt is a 76 y.o. female with past medical history of hypertension, diabetes mellitus type 2, GERD, and pancreatic mass s/p exploratory laparotomy with gastrojejunostomy tube placement on 04/08/21 by general surgery Dr. Zenia Resides for duodenal obstruction presented with complaints of being unable to tolerate oral intake due to nausea, vomiting, abdominal distention, and diffuse abdominal pain.  Previously she notes she had been on TPN, but had discomfort from the PICC line in her arm and states that it made her blood sugars elevated.  Furthermore, she does not want the nasogastric tube placed either.  She would like the [place] tube that was placed to be able to be used.  Patient was admitted under general surgery service and hospitalist team was consulted for medical co-management of hypertension and type 2 diabetes.  Hospital course complicated by severe abdominal pain, refractory to high doses of IV Dilaudid.  Palliative care team was consulted by general surgery to assist with the management of her intractable abdominal pain.  Post GJ tube evaluation and manipulation by IR on 05/05/2021.  Met sepsis criteria with leukocytosis, tachycardia, right middle lobe infiltrates suggestive of pneumonia for which she was started on cefepime and IV Flagyl.  Responded well to IV antibiotics.  Leukocytosis has resolved, afebrile.  Switched IV antibiotics to Cefdinir on 05/07/21.  TOC assisting with DC planning.  Patient will continue tube feeding at home.  Prior to admission, at home, she was taking tube feeding at night and eating a soft diet during the day.  Prealbumin level severely low at 5.5 on 05/06/21 from 23.9 on 04/29/2021.  Diet advanced to soft diet from full liquid diet on 05/07/2021.  05/08/2021: Patient was seen at her bedside.  She wants to go home.  States she is  tolerating her diet.  No vomiting.  Assessment/Plan: Principal Problem:   Small bowel obstruction secondary to pancreatic cancer Active Problems:   Essential (primary) hypertension   Gastro-esophageal reflux disease without esophagitis   Type 2 diabetes mellitus without complication (HCC)   Protein-calorie malnutrition, severe   Pancreatic cancer (HCC)   Leukocytosis   Hyponatremia  Resolved intractable nausea/vomiting/history of ex laparotomy, biopsy of pancreatic mass, GJ bypass with placement of GJ tube, post GJ tube evaluation and manipulation by IR on 05/05/2021:  Management per primary team General surgery. Pain management per palliative care team, consulted by primary team.  Post GJ tube evaluation and manipulation by IR on 05/05/2021. Abdominal x-ray done on 05/05/2021, showing nonobstructive bowel gas pattern.  Gastrojejunostomy tube in place. Diet advanced by general surgery. Nausea has resolved and she is requesting a regular consistency diet. IV antiemetics as needed Appreciate dietitian and TOC's assistance.  Abdominal pain in the setting of above. Tramadol 50 mg twice daily Oxycodone as needed for moderate IV Dilaudid as needed for severe pain Bowel regimen Senokot 2 tablets twice daily  Resolving sepsis secondary to right middle lobe pneumonia, possibly present on admission, with concern for aspiration Personally reviewed chest x-ray done on 05/05/2021.  It shows right middle lobe infiltrates suggestive of pneumonia. Possible aspiration from intractable vomiting, suspect present on admission. Leukocytosis resolved.  Afebrile. On 2/9/she had worsening leukocytosis, tachycardia, elevated CRP Blood cultures x2 peripherally, negative to date. Cefepime and IV Flagyl, started on 05/05/2021, DC'd on 05/07/2021. Cefdinir 300 mg twice daily x3 days started on 05/07/2021.  Resolved sinus tachycardia, suspect driven  by sepsis TSH 0.673, within normal limit. Received gentle IV fluid  hydration LR 50 cc/h x 2 days, stop on 05/07/2021. Follow blood cultures, sputum culture, negative to date. Continue to treat underlying conditions  Resolved post repletion: Hypomagnesemia Serum magnesium 1.5> 1.8.  Improving, post IV fluid hydration, hypovolemic hyponatremia Serum sodium 129> 131 TSH within normal limit, 0.673. Continue gentle IV fluid hydration LR 50 cc/h x 1 day\.  Acute blood loss anemia/chronic normocytic anemia Could be contributed by IV fluid, dilutional. Hemoglobin 8.4 from 10.0. No overt bleeding. Continue to monitor repeat CBC in the morning.  Prediabetes with hyperglycemia, hypoglycemia is improved:  Hemoglobin A1c 5.9 on 04/30/2021. Insulin sliding scale Avoid hypoglycemia.   Questionable hypertension:  Hypertension is mentioned in the H&P.  I am not really sure if she has the history are not as she does not seem to be on any medications and yet her blood pressures controlled.   BP is normotensive.   Resolved AKI, likely prerenal in the setting of dehydration from poor oral intake. Baseline creatinine appears to be 0.8 with GFR greater than 60 Presented with creatinine of 1.4 She is back to her baseline creatinine 0.8 with GFR greater than 60. Continue to encourage increase in oral protein calorie intake.  Severe protein calorie malnutrition Moderate muscle mass loss Severe hypoalbuminemia with prealbumin 5.5 on 05/06/2021. Management per dietitian. Continue soft diet and cyclic PEG tube feedings   Goals of care Palliative care team following, consulted by primary team General surgery. Patient is full code. TOC assisting with discharge planning.  Patient will require tube feeding at discharge as well as home health RN, aide, CSW, face-to-face completed.  Thank you for allowing [place] to participate in the care of this patient.  We will continue to follow along with you.    DVT prophylaxis: Subcu Lovenox daily.    Code Status: Full Code   Family  Communication: Husband of 40 years present at bedside.     Objective: Vitals:   05/08/21 0003 05/08/21 0350 05/08/21 0500 05/08/21 0738  BP: 124/77 114/80  130/84  Pulse: (!) 109 (!) 102  (!) 106  Resp: '17 19  18  ' Temp: 98.2 F (36.8 C) 98.4 F (36.9 C)  (!) 97.5 F (36.4 C)  TempSrc: Oral Oral  Oral  SpO2: 100% 99%  100%  Weight:   51.2 kg   Height:        Intake/Output Summary (Last 24 hours) at 05/08/2021 1217 Last data filed at 05/08/2021 0818 Gross per 24 hour  Intake 460 ml  Output --  Net 460 ml   Filed Weights   05/06/21 0420 05/07/21 0500 05/08/21 0500  Weight: 51.1 kg 52.3 kg 51.2 kg    Exam:  General: 76 y.o. year-old female frail-appearing in no acute distress.  She is alert and oriented x3. Cardiovascular: Regular rate and rhythm no rubs or gallops.   Respiratory: Clear to auscultation with no wheezes or rales.   Abdomen: GJ tube in place.  Bowel sounds present. Musculoskeletal: Trace lower extremity edema bilaterally. Skin: No ulcerative lesions noted. Psychiatry: Mood is appropriate for condition 7. Neuro exam: Moves all 4 extremities.  Nonfocal exam.   Data Reviewed: CBC: Recent Labs  Lab 05/05/21 0951 05/06/21 0649  WBC 15.4* 8.4  NEUTROABS 13.6*  --   HGB 10.0* 8.4*  HCT 28.9* 24.2*  MCV 85.8 86.7  PLT 271 416   Basic Metabolic Panel: Recent Labs  Lab 05/02/21 0118 05/04/21 0357 05/05/21 0749  05/05/21 0951 05/06/21 0649  NA 129* 131*  --  129* 131*  K 3.7 4.1  --  4.8 4.0  CL 100 96*  --  95* 100  CO2 23 25  --  24 23  GLUCOSE 108* 125*  --  136* 127*  BUN 9 16  --  23 19  CREATININE 0.92 0.88 1.01* 0.96 0.88  CALCIUM 8.5* 8.7*  --  8.4* 8.1*  MG  --   --   --  1.5* 1.8  PHOS  --   --   --  4.2  --    GFR: Estimated Creatinine Clearance: 42.3 mL/min (by C-G formula based on SCr of 0.88 mg/dL). Liver Function Tests: Recent Labs  Lab 05/05/21 0951  AST 35  ALT 13  ALKPHOS 100  BILITOT 1.2  PROT 5.3*  ALBUMIN 3.2*    No results for input(s): LIPASE, AMYLASE in the last 168 hours.  No results for input(s): AMMONIA in the last 168 hours. Coagulation Profile: No results for input(s): INR, PROTIME in the last 168 hours.  Cardiac Enzymes: No results for input(s): CKTOTAL, CKMB, CKMBINDEX, TROPONINI in the last 168 hours. BNP (last 3 results) No results for input(s): PROBNP in the last 8760 hours. HbA1C: No results for input(s): HGBA1C in the last 72 hours. CBG: Recent Labs  Lab 05/07/21 1142 05/07/21 1728 05/07/21 2003 05/08/21 0001 05/08/21 0739  GLUCAP 140* 141* 145* 150* 172*   Lipid Profile: No results for input(s): CHOL, HDL, LDLCALC, TRIG, CHOLHDL, LDLDIRECT in the last 72 hours. Thyroid Function Tests: Recent Labs    05/05/21 1637  TSH 0.673   Anemia Panel: No results for input(s): VITAMINB12, FOLATE, FERRITIN, TIBC, IRON, RETICCTPCT in the last 72 hours. Urine analysis:    Component Value Date/Time   COLORURINE COLORLESS (A) 03/22/2021 1851   APPEARANCEUR CLEAR 03/22/2021 1851   LABSPEC 1.005 03/22/2021 1851   PHURINE 9.0 (H) 03/22/2021 1851   GLUCOSEU NEGATIVE 03/22/2021 1851   HGBUR NEGATIVE 03/22/2021 1851   BILIRUBINUR NEGATIVE 03/22/2021 1851   KETONESUR NEGATIVE 03/22/2021 1851   PROTEINUR NEGATIVE 03/22/2021 1851   UROBILINOGEN 0.2 11/15/2012 2157   NITRITE NEGATIVE 03/22/2021 1851   LEUKOCYTESUR NEGATIVE 03/22/2021 1851   Sepsis Labs: '@LABRCNTIP' (procalcitonin:4,lacticidven:4)  ) Recent Results (from the past 240 hour(s))  Blood Culture (routine x 2)     Status: None   Collection Time: 04/28/21 12:38 PM   Specimen: BLOOD  Result Value Ref Range Status   Specimen Description BLOOD LEFT ANTECUBITAL  Final   Special Requests   Final    BOTTLES DRAWN AEROBIC AND ANAEROBIC Blood Culture results may not be optimal due to an inadequate volume of blood received in culture bottles   Culture   Final    NO GROWTH 5 DAYS Performed at [facility], [place]  [address] [place], [place] [address]    Report Status 05/03/2021 FINAL  Final  Resp Panel by RT-PCR (Flu A&B, Covid) Nasopharyngeal Swab     Status: None   Collection Time: 04/28/21 12:38 PM   Specimen: Nasopharyngeal Swab; Nasopharyngeal(NP) swabs in vial transport medium  Result Value Ref Range Status   SARS Coronavirus 2 by RT PCR NEGATIVE NEGATIVE Final    Comment: (NOTE) SARS-CoV-2 target nucleic acids are NOT DETECTED.  The SARS-CoV-2 RNA is generally detectable in upper respiratory specimens during the acute phase of infection. The lowest concentration of SARS-CoV-2 viral copies this assay can detect is 138 copies/mL. A negative result does not  preclude SARS-Cov-2 infection and should not be used as the sole basis for treatment or other patient management decisions. A negative result may occur with  improper specimen collection/handling, submission of specimen other than nasopharyngeal swab, presence of viral mutation(s) within the areas targeted by this assay, and inadequate number of viral copies(<138 copies/mL). A negative result must be combined with clinical observations, patient history, and epidemiological information. The expected result is Negative.  Fact Sheet for Patients:  EntrepreneurPulse.com.au  Fact Sheet for Healthcare Providers:  IncredibleEmployment.be  This test is no t yet approved or cleared by the [place] FDA and  has been authorized for detection and/or diagnosis of SARS-CoV-2 by FDA under an Emergency Use Authorization (EUA). This EUA will remain  in effect (meaning this test can be used) for the duration of the COVID-19 declaration under Section 564(b)(1) of the Act, 21 U.S.C.section 360bbb-3(b)(1), unless the authorization is terminated  or revoked sooner.       Influenza A by PCR NEGATIVE NEGATIVE Final   Influenza B by PCR NEGATIVE NEGATIVE Final    Comment: (NOTE) The Xpert Xpress SARS-CoV-2/FLU/RSV plus  assay is intended as an aid in the diagnosis of influenza from Nasopharyngeal swab specimens and should not be used as a sole basis for treatment. Nasal washings and aspirates are unacceptable for Xpert Xpress SARS-CoV-2/FLU/RSV testing.  Fact Sheet for Patients: EntrepreneurPulse.com.au  Fact Sheet for Healthcare Providers: IncredibleEmployment.be  This test is not yet approved or cleared by the [place] FDA and has been authorized for detection and/or diagnosis of SARS-CoV-2 by FDA under an Emergency Use Authorization (EUA). This EUA will remain in effect (meaning this test can be used) for the duration of the COVID-19 declaration under Section 564(b)(1) of the Act, 21 U.S.C. section 360bbb-3(b)(1), unless the authorization is terminated or revoked.  Performed at [facility], [place] [address] [place], [place] [address]   Blood Culture (routine x 2)     Status: None   Collection Time: 04/28/21  1:29 PM   Specimen: BLOOD  Result Value Ref Range Status   Specimen Description BLOOD LEFT ANTECUBITAL  Final   Special Requests   Final    BOTTLES DRAWN AEROBIC AND ANAEROBIC Blood Culture adequate volume   Culture   Final    NO GROWTH 5 DAYS Performed at [facility], [place] [address], [place], [place] [address]    Report Status 05/03/2021 FINAL  Final  Culture, blood (routine x 2)     Status: None (Preliminary result)   Collection Time: 05/05/21  4:36 PM   Specimen: BLOOD  Result Value Ref Range Status   Specimen Description BLOOD RIGHT ANTECUBITAL  Final   Special Requests   Final    BOTTLES DRAWN AEROBIC AND ANAEROBIC Blood Culture adequate volume   Culture   Final    NO GROWTH 3 DAYS Performed at [facility], [place] [address] [place], [place] [address]    Report Status PENDING  Incomplete  Culture, blood (routine x 2)     Status: None (Preliminary result)   Collection Time: 05/05/21  4:37 PM   Specimen: BLOOD   Result Value Ref Range Status   Specimen Description BLOOD RIGHT ANTECUBITAL  Final   Special Requests   Final    BOTTLES DRAWN AEROBIC AND ANAEROBIC Blood Culture adequate volume   Culture   Final    NO GROWTH 3 DAYS Performed at [facility], [place] [address] [place], [place] [address]  Report Status PENDING  Incomplete      Studies: No results found.  Scheduled Meds:  cefdinir  300 mg Oral Q12H   docusate sodium  100 mg Oral BID   enoxaparin (LOVENOX) injection  40 mg Subcutaneous Q24H   feeding supplement  237 mL Oral TID BM   feeding supplement (OSMOLITE 1.5 CAL)  1,000 mL Per Tube Q24H   feeding supplement (PROSource TF)  45 mL Per Tube BID   insulin aspart  0-9 Units Subcutaneous TID WC   pantoprazole  40 mg Oral BID   senna-docusate  2 tablet Oral BID   traMADol  50 mg Oral BID    Continuous Infusions:     LOS: 10 days     Kayleen Memos, MD [facility] Pager [phone]  If 7PM-7AM, please contact night-coverage [email or web address] Password [facility] 05/08/2021, 12:17 PM

## 2021-05-08 NOTE — Progress Notes (Signed)
Mobility Specialist: Progress Note   05/08/21 1057  Mobility  Activity Refused mobility   Pt refused mobility with c/o not feeling well this morning. Pt states she did not get much sleep last night either. Will f/u as able.   [facility] [place] Eddith [place]or Mobility Specialist Mobility Specialist 5 North: [phone] Mobility Specialist 6 North: [phone]

## 2021-05-08 NOTE — Progress Notes (Signed)
Patient signed AMA form. 3 ensures given to patient to take home with her per MD.

## 2021-05-08 NOTE — Progress Notes (Signed)
Patient refused enema.

## 2021-05-08 NOTE — TOC Transition Note (Signed)
Transition of Care [facility]) - CM/SW Discharge Note   Patient Details  Name: Imajean Mcdermid MRN: [id number] Date of Birth: 1945-09-19  [facility] CM/SW Contact:  Bartholomew Crews, RN Phone Number: [phone] 05/08/2021, 12:41 PM   Clinical Narrative:     Spoke with patient at the bedside using telephonic language line Spanish interpreter. Spouse was present during call but requests to not be her interpreter. Patient stated that she has not slept in 3 days. She acknowledges that lack of sleep is making her feel worse. She says her discharge has been promised and promised and she is tired of waiting. She stated it was a mistake to come to hospital, because she just wants to sleep in her own bed. She is 76 years old and has pancreatic cancer. She wants to go home. Patient stated that she will continue to drink her liquids. Patient advised that Alvis Lemmings will see her on [place] and her TF supplies will be delivered to home tomorrow. Advised to follow up with PCP. Patient stated that she has transportation home. Patient expressed appreciation for helping her transition home tomorrow.   Pam with [employer] and Tommi Rumps with Alvis Lemmings advised of transition home today. Training for TF will occur on Tuesday with [facility] RN. Referral to [place] for outpatient palliative care - HOP to reach out to patient in 1-2 weeks.   Spouse asked about handicap sticker for car. Advised to follow up with PCP. Social worker through [place] to assist with community resources and [employer] application.   No further TOC needs identified at this time.   Final next level of care: [place] Barriers to Discharge: No Barriers Identified   Patient Goals and CMS Choice Patient states their goals for this hospitalization and ongoing recovery are:: to sleep in her own bed tonight CMS Medicare.gov Compare Post Acute Care list provided to:: Patient Choice offered to / list presented to :  Patient  Discharge Placement                       Discharge Plan and Services   Discharge Planning Services: CM Consult Post Acute Care Choice: Home Health          DME Arranged: Tube feeding pump, Tube feeding DME Agency: Other - Comment ([facility]) Date DME Agency Contacted: 05/08/21 Time DME Agency Contacted: 22 Representative spoke with at DME Agency: [place]: RN, Social Work, Nurse's Aide [place]: [place] Date [place]: 05/08/21 Time [place]: 98 Representative spoke with at [place]: [place] ([place]) Interventions     Readmission Risk Interventions Readmission Risk Prevention Plan 04/19/2021  Transportation Screening Complete  PCP or Specialist Appt within 3-5 Days Complete  HRI or [place] Complete  Social Work Consult for [place] Planning/Counseling [place] Not Applicable  Medication Review Press photographer) Complete  Some recent data might be hidden

## 2021-05-10 DIAGNOSIS — K5900 Constipation, unspecified: Secondary | ICD-10-CM | POA: Diagnosis not present

## 2021-05-10 DIAGNOSIS — I10 Essential (primary) hypertension: Secondary | ICD-10-CM | POA: Diagnosis not present

## 2021-05-10 DIAGNOSIS — E871 Hypo-osmolality and hyponatremia: Secondary | ICD-10-CM | POA: Diagnosis not present

## 2021-05-10 DIAGNOSIS — K219 Gastro-esophageal reflux disease without esophagitis: Secondary | ICD-10-CM | POA: Diagnosis not present

## 2021-05-10 DIAGNOSIS — J189 Pneumonia, unspecified organism: Secondary | ICD-10-CM | POA: Diagnosis not present

## 2021-05-10 DIAGNOSIS — N179 Acute kidney failure, unspecified: Secondary | ICD-10-CM | POA: Diagnosis not present

## 2021-05-10 DIAGNOSIS — K315 Obstruction of duodenum: Secondary | ICD-10-CM | POA: Diagnosis not present

## 2021-05-10 DIAGNOSIS — E860 Dehydration: Secondary | ICD-10-CM | POA: Diagnosis not present

## 2021-05-10 DIAGNOSIS — M19042 Primary osteoarthritis, left hand: Secondary | ICD-10-CM | POA: Diagnosis not present

## 2021-05-10 DIAGNOSIS — M549 Dorsalgia, unspecified: Secondary | ICD-10-CM | POA: Diagnosis not present

## 2021-05-10 DIAGNOSIS — M19041 Primary osteoarthritis, right hand: Secondary | ICD-10-CM | POA: Diagnosis not present

## 2021-05-10 DIAGNOSIS — I499 Cardiac arrhythmia, unspecified: Secondary | ICD-10-CM | POA: Diagnosis not present

## 2021-05-10 DIAGNOSIS — N1330 Unspecified hydronephrosis: Secondary | ICD-10-CM | POA: Diagnosis not present

## 2021-05-10 DIAGNOSIS — E43 Unspecified severe protein-calorie malnutrition: Secondary | ICD-10-CM | POA: Diagnosis not present

## 2021-05-10 DIAGNOSIS — K5790 Diverticulosis of intestine, part unspecified, without perforation or abscess without bleeding: Secondary | ICD-10-CM | POA: Diagnosis not present

## 2021-05-10 DIAGNOSIS — Z434 Encounter for attention to other artificial openings of digestive tract: Secondary | ICD-10-CM | POA: Diagnosis not present

## 2021-05-10 DIAGNOSIS — D170 Benign lipomatous neoplasm of skin and subcutaneous tissue of head, face and neck: Secondary | ICD-10-CM | POA: Diagnosis not present

## 2021-05-10 DIAGNOSIS — E079 Disorder of thyroid, unspecified: Secondary | ICD-10-CM | POA: Diagnosis not present

## 2021-05-10 DIAGNOSIS — D72829 Elevated white blood cell count, unspecified: Secondary | ICD-10-CM | POA: Diagnosis not present

## 2021-05-10 DIAGNOSIS — M25531 Pain in right wrist: Secondary | ICD-10-CM | POA: Diagnosis not present

## 2021-05-10 DIAGNOSIS — M810 Age-related osteoporosis without current pathological fracture: Secondary | ICD-10-CM | POA: Diagnosis not present

## 2021-05-10 DIAGNOSIS — E119 Type 2 diabetes mellitus without complications: Secondary | ICD-10-CM | POA: Diagnosis not present

## 2021-05-10 DIAGNOSIS — Z48815 Encounter for surgical aftercare following surgery on the digestive system: Secondary | ICD-10-CM | POA: Diagnosis not present

## 2021-05-10 DIAGNOSIS — C251 Malignant neoplasm of body of pancreas: Secondary | ICD-10-CM | POA: Diagnosis not present

## 2021-05-10 DIAGNOSIS — G43909 Migraine, unspecified, not intractable, without status migrainosus: Secondary | ICD-10-CM | POA: Diagnosis not present

## 2021-05-10 LAB — CULTURE, BLOOD (ROUTINE X 2)
Culture: NO GROWTH
Culture: NO GROWTH
Special Requests: ADEQUATE
Special Requests: ADEQUATE

## 2021-05-11 ENCOUNTER — Telehealth: Payer: Self-pay | Admitting: Oncology

## 2021-05-11 NOTE — Telephone Encounter (Signed)
Spoke w/ patients spouse to notify him of upcoming appt to see Dr.McCarty. He is aware of the appt date and time.

## 2021-05-11 NOTE — Discharge Summary (Signed)
Physician Discharge Summary   Patient ID: Leah Pruitt [id number] 76 y.o. 1945/08/21  Admit date: 04/28/2021  Discharge date and time: 05/08/2021  2:12 PM   Admitting Physician: Dwan Bolt, MD   Discharge Physician: Louanna Raw, MD  Admission Diagnoses: Pancreatic cancer ([place]) [C25.9], dehydration, acute kidney injury, HCAP  Discharge Diagnoses: Pancreatic cancer  Admission Condition: poor  Discharged Condition: fair  Indication for Admission: Leah Pruitt is a 76 yo female who presented with duodenal obstruction secondary to a mass in the body of the pancreas. She underwent a GJ bypass and placement of a feeding GJ tube on 04/08/21. The limb ultimately refluxed back into the stomach, but she was able to eat with only minimal venting of her G tube and was discharged home. After about a week at home, she began having worsening pain and nausea, with vomiting. She presented to the ED and was found to have a mild AKI. CT scan did not show any fluid collections but was consistent with a mild ileus. She was admitted for hydration and symptom management.  Hospital Course: The patient was admitted and started on IV fluid hydration, and her creatinine normalized. She began having bowel movements. General medicine was consulted for management of medical comorbidities. Nutrition was consulted and a calorie count confirmed that her oral intake was not adequate to meet her nutritional needs. The patient adamantly refused TPN. After multiple discussions the patient agreed to have her feeding tube was replaced. This was done under fluoroscopic guidance by IR on 2/9, and the J limb was positioned in the efferent jejunum. Following the procedure, tube feeds were started in the J tube and slowly advanced to goal. The patient tolerated this. She was treated with IV abx for a pneumonia noted on CXR and WBC subsequently normalized. Palliative care was consulted for assistance with pain management and  goals of care. On 2/11 she again had increased fullness and on venting her G tube, tube feeds refluxed out. She was not able to eat without frequent venting of her G tube. After an extensive discussion during which it was recommended that she remain in the hospital to determine a nutrition plan, the patient opted to leave against medical advice. The transitions of care team was contacted to help set up home health care for the patient. The patient signed out [place] on 05/08/21.  Consults:  hospitalist, palliative care  Significant Diagnostic Studies: radiology: CXR: infiltrates: middle lobe on the right and CT scan: Moderate distension of the stomach and proximal small bowel consistent with mild ileus. Stable pancreatic body mass with obstruction of the duodenum.  Treatments: IV hydration, analgesia: Dilaudid and oxycodone, therapies: PT and OT, and procedures: replacement of feeding GJ tube (by IR); antibiotics: cefepime  Discharge Exam: General: resting comfortably, NAD Neuro: alert and oriented, no focal deficits Resp: normal work of breathing on room air Abdomen: soft, nondistended, nontender. Midline incision clean and dry. GJ tube in place. Extremities: warm and well-perfused  Disposition:  There are no questions and answers to display.        Patient Instructions:  Allergies as of 05/08/2021       Reactions   Iodinated Contrast Media Swelling   Pt had swelling after getting IV contrast during an IVP years ago   Naratriptan Anaphylaxis   Rash, difficult time breathing and swallowing   Penicillins Other (See Comments)   Numbness of mouth and dry mouth Tolerated Cephalosporin Date: 04/19/21.     Iodine Swelling   Ciprofloxacin  Other reaction(s): Other (See Comments) Tendon pain   Duragesic-100 [fentanyl] Other (See Comments)   Spouse states pt had trouble breathing and stomach began to swell (patch specific)   Penicillin G Rash        Medication List     STOP taking  these medications    acetaminophen 325 MG tablet Commonly known as: TYLENOL   Benicar 20 MG tablet Generic drug: olmesartan   docusate sodium 100 MG capsule Commonly known as: COLACE   Glumetza 500 MG (MOD) 24 hr tablet Generic drug: metFORMIN   pantoprazole 40 MG tablet Commonly known as: PROTONIX   prochlorperazine 10 MG tablet Commonly known as: COMPAZINE       TAKE these medications    Calcium Carbonate-Vitamin D 600-200 MG-UNIT Tabs Take 1 tablet by mouth 2 (two) times daily with a meal.   CAMPHOR EX Apply 1 application topically as needed (pain).   diltiazem 120 MG 24 hr capsule Commonly known as: TIAZAC Do not take till you talk with your Medical doctor about her blood pressure What changed:  how much to take how to take this when to take this additional instructions   DRY EYE RELIEF OP Apply 1 drop to eye as needed (Dryness).   feeding supplement (OSMOLITE 1.5 CAL) Liqd 1,000 mLs by Per J Tube route daily. Okay for jevity instead of osmolite   feeding supplement (PROSource TF) liquid Place 45 mLs into feeding tube 2 (two) times daily.   feeding supplement Liqd Take 237 mLs by mouth 3 (three) times daily between meals for 7 days.   hydrOXYzine 10 MG tablet Commonly known as: ATARAX Take 1 tablet (10 mg total) by mouth every 8 (eight) hours as needed for up to 10 days for anxiety. What changed: when to take this   magnesium hydroxide 800 MG/5ML suspension Commonly known as: MILK OF MAGNESIA Take 30 mLs by mouth daily.   meclizine 25 MG tablet Commonly known as: ANTIVERT Take 25 mg by mouth daily as needed for dizziness.   Movantik 12.5 MG Tabs tablet Generic drug: naloxegol oxalate Take 12.5 mg by mouth daily as needed (constipation).   nitroGLYCERIN 0.4 MG SL tablet Commonly known as: NITROSTAT Place 0.4 mg under the tongue every 5 (five) minutes as needed for chest pain.   ondansetron 4 MG disintegrating tablet Commonly known as:  ZOFRAN-ODT Take 4 mg by mouth every 8 (eight) hours as needed for nausea or vomiting. What changed: Another medication with the same name was added. Make sure you understand how and when to take each.   ondansetron 4 MG disintegrating tablet Commonly known as: ZOFRAN-ODT Take 1 tablet (4 mg total) by mouth every 8 (eight) hours as needed for nausea. What changed: You were already taking a medication with the same name, and this prescription was added. Make sure you understand how and when to take each.   oxyCODONE 5 MG immediate release tablet Commonly known as: Oxy IR/ROXICODONE Take 1 tablet (5 mg total) by mouth every 6 (six) hours as needed for up to 3 days for severe pain (5mg  for moderate pain, 10 mg for severe pain). What changed:  how much to take when to take this   polyethylene glycol 17 g packet Commonly known as: MiraLax Take 17 g by mouth daily as needed for mild constipation.   RABEprazole 20 MG tablet Commonly known as: ACIPHEX Take 20 mg by mouth 2 (two) times daily.   senna-docusate 8.6-50 MG tablet Commonly known as: Senokot-S  Take 2 tablets by mouth at bedtime for 10 days.   sucralfate 1 GM/10ML suspension Commonly known as: CARAFATE Take 1 g by mouth 2 (two) times daily.   traMADol 50 MG tablet Commonly known as: ULTRAM Take 1 tablet (50 mg total) by mouth every 6 (six) hours as needed for up to 3 days for moderate pain. What changed:  how much to take when to take this reasons to take this       ASK your doctor about these medications    cefdinir 300 MG capsule Commonly known as: OMNICEF Take 1 capsule (300 mg total) by mouth every 12 (twelve) hours for 2 days. Ask about: Should I take this medication?       Activity: ambulate in house and do not drive while taking narcotic pain medication Diet: regular diet Wound Care: none needed  Patient was referred to heme/onc after discharge (Dr. Hinton Rao). Follow up with Dr. Zenia Resides will be arranged in  1-2 weeks.  Signed: Dwan Bolt 05/11/2021 12:18 PM

## 2021-05-13 DIAGNOSIS — K315 Obstruction of duodenum: Secondary | ICD-10-CM | POA: Diagnosis not present

## 2021-05-13 DIAGNOSIS — Z48815 Encounter for surgical aftercare following surgery on the digestive system: Secondary | ICD-10-CM | POA: Diagnosis not present

## 2021-05-13 DIAGNOSIS — E43 Unspecified severe protein-calorie malnutrition: Secondary | ICD-10-CM | POA: Diagnosis not present

## 2021-05-13 DIAGNOSIS — C251 Malignant neoplasm of body of pancreas: Secondary | ICD-10-CM | POA: Diagnosis not present

## 2021-05-13 DIAGNOSIS — N1330 Unspecified hydronephrosis: Secondary | ICD-10-CM | POA: Diagnosis not present

## 2021-05-13 DIAGNOSIS — Z434 Encounter for attention to other artificial openings of digestive tract: Secondary | ICD-10-CM | POA: Diagnosis not present

## 2021-05-16 DIAGNOSIS — Z8719 Personal history of other diseases of the digestive system: Secondary | ICD-10-CM | POA: Diagnosis not present

## 2021-05-16 DIAGNOSIS — Z681 Body mass index (BMI) 19 or less, adult: Secondary | ICD-10-CM | POA: Diagnosis not present

## 2021-05-16 DIAGNOSIS — Z980 Intestinal bypass and anastomosis status: Secondary | ICD-10-CM | POA: Diagnosis not present

## 2021-05-16 DIAGNOSIS — C259 Malignant neoplasm of pancreas, unspecified: Secondary | ICD-10-CM | POA: Diagnosis not present

## 2021-05-17 NOTE — Progress Notes (Signed)
[facility]  [address] [place],  [place]  [address] [phone]  Clinic Day:  05/18/2021  Referring physician: Venetia Maxon, [name], *  This document serves as a record of services personally performed by Hosie Poisson, MD. It was created on their behalf by Curry,Lauren E, a trained medical scribe. The creation of this record is based on the scribe's personal observations and the provider's statements to them.  ASSESSMENT & PLAN:   Pancreatic adenocarcinoma, with a mass measuring 3.3 x 3.5 cm on MRI. Treatment options were reviewed today including chemotherapy and palliative care was also reviewed. I honestly doubt that she would later be a candidate for surgical resection, and so this is likely to be more palliative in nature. I attempted to explain the potential toxicities of chemotherapy and what would be involved, but I don't think she is fully grasping the full extent of what is involved with chemotherapy.She does not wish to undergo any further procedures.   Feeding tube, which has had many complications and remains tender with manipulation. This was flushed yesterday, and so she should be able to start tube feedings. I advised that she and her husband continue to flush this daily in order to prevent further clogging. She will finally start tube feedings and working on gaining weight. I recommend starting at a slow rate and slowly increasing and will ask the dietician to assist them with this.  Anemia. We will check iron studies, B12 and folate today.  Gemcitabine is the mildest approach we can offer and this is very likely to cause phlebitis and scarring. I have explained this to the patient and she is insistent that this be through peripheral IV and not a port. She insists that her sister in [place] received chemotherapy this way, but that sister has also expired.    This is a pleasant 76 year old female recently diagnosed with pancreatic  adenocarcinoma. The diagnosis, prognosis and pathology were reviewed today. Treatment options were reviewed today including chemotherapy. With her current performance status I would recommend a milder regimen at this time, which would be weekly gemcitabine. If she is able to tolerate this, and her performance status improves, we could consider adding in additional agents. Her performance status is 1-2 and I am not sure that she could tolerate even mild chemotherapy or want to deal with the constant weekly visits that it would entail. However, she would need to undergo port placement in order to receive treatment. Another option would be to pursue comfort care and focus on quality of life. She and her husband will take some time to make a decision. In the meantime, she will work on her nutrition and trying to gain weight. We will plan to see her back in 1-2 weeks with CBC and CMP for repeat evaluation. If she decides that she wants to move forward, we can proceed with arrangements for chemo-education and port placement. I am not sure that is really the direction she wants to go, and we may focus more on palliative care and quality of life. She will think about what she wants to do. She and her husband understand and agree with this plan of care. They know to call with any concerns.  Thank you for the opportunity to participate in the care of your patients   I provided 70 minutes of face-to-face time during this this encounter and > 50% was spent counseling as documented under my assessment and plan.    Altha Harm  Kathrin Ruddy, MD [facility] [address] [place] [address] Dept: [phone] Dept Fax: [phone]    CHIEF COMPLAINT:  CC: Pancreatic adenocarcinoma  Current Treatment:  Still being finalized   HISTORY OF PRESENT ILLNESS:  Leah Pruitt is a 76 y.o. female referred by Dr. Venetia Maxon for the evaluation and treatment of a  pancreatic mass. She has had ongoing abdominal pain for several years, often treated with cipro for diverticulitis. However, the  pain became more severe with nausea and vomiting over the last couple of months. The patient presented to the [facility] emergency department in December 2022 due to epigastric, left upper quadrant and left lower quadrant abdominal pain with associated nausea vomiting. CT abdomen and pelvis revealed a solid-appearing 3.9 cm mass appears to arise from the inferior pancreatic body, concerning for pancreatic neoplasm. There was also moderate left renal atrophy with severe hydronephrosis to the UPJ, consistent with chronic UPJ obstruction. MRI abdomen confirmed an ill-defined hypovascular mass, measuring 3.3  x 3.5 cm, involving the pancreatic tail, distal duodenum causing duodenal obstruction, and left UPJ causing moderate left hydronephrosis. Patient was scheduled for biopsy via EUS on 03/29/2021; however, required hospitalization regarding increased nausea, vomiting and pain. During this hospital stay, the patient's symptoms were managed with insertion of NG tube. This was quite uncomfortable for the patient and led to the patient's frustration with being hospitalized without biopsy. She ultimately left the hospital AMA and canceled her EUS appointment on the 3rd. Of note, she also had a positive Cologuard test result and was advised to undergo colonoscopy.   The patient was directly admitted to [facility] on 04/07/2021.  A PICC line was placed and TPN was initiated and she was given IV fluid hydration.  She was taken to the operating room the following day for exploratory laparotomy and a gastrojejunal bypass for relief of her duodenal obstruction.  The pancreatic mass was clearly involving the duodenum intraoperatively, and biopsy confirmed diagnosis of adenocarcinoma.  A feeding GJ tube was also placed intraoperatively. She developed tachycardia intraoperatively which persisted  postoperatively, and extensive work-up showed sinus tachycardia and no signs of active bleeding.  She was transferred to the progressive care unit for further monitoring.  Her G-tube was placed to gravity drainage and J-tube feeds were started and slowly advanced.  She had slow progression with delayed return of bowel function, and on postop day 6 there appeared to be tube feeds in the gastric tube.  A CT scan confirmed that the jejunal limb of her GJ tube had retracted into the stomach. Tube feeds were stopped and a trial of G-tube clamping was initiated.  Patient was able to tolerate G-tube clamping with intermittent venting.  Her diet was slowly advanced from clear liquids to a soft diet.  She was able to tolerate a soft diet with no vomiting.  Her TPN was weaned off.  Her sinus tachycardia persisted but slowly improved and she was continued on her home diltiazem dosing.  On the morning of 1/24, she was tolerating a soft diet with her G-tube clamped with only occasional requirements for G-tube venting.  She was passing flatus and pain was controlled.  She expressed a strong desire to go home.  She was discharged with home health nursing, and she and her husband were given instructions on venting her G-tube as needed at home.    She was readmitted on February 2nd. After about a week at home, she began  having worsening pain and nausea, with vomiting. She presented to the ED and was found to have a mild acute kidney injury. CT scan did not show any fluid collections but was consistent with a mild ileus. She was admitted for hydration and symptom management. Nutrition was consulted and a calorie count confirmed that her oral intake was not adequate to meet her nutritional needs. The patient adamantly refused TPN. After multiple discussions the patient agreed to have her feeding tube replaced. This was done under fluoroscopic guidance by IR on 2/9, and the J limb was positioned in the efferent jejunum. Following the  procedure, tube feeds were started in the J tube and slowly advanced to goal. The patient tolerated this. She was treated with IV abx for a pneumonia noted on CXR and WBC subsequently normalized. Palliative care was consulted for assistance with pain management and goals of care. On 2/11 she again had increased fullness and on venting her G tube, tube feeds refluxed out. She was not able to eat without frequent venting of her G tube. The patient opted to leave against medical advice on 05/08/21. I was contacted by Dr. Michaelle Birks to follow up for palliative care and possible treatment of pancreatic cancer.   INTERVAL HISTORY:  I have reviewed her chart and materials related to her cancer extensively and collaborated history with the patient. Summary of oncologic history is as follows: Oncology History   No history exists.    Leah Pruitt states that her main complaint is a headache today. She also has pain when the feeding tube is being manipulated. She has only been using oxycodone infrequently as needed, and did not tolerate Fentanyl patches. She reports nausea and has been using meclizine and ondansetron. She has been using her feeding tube when able, and has been able to eat a soft diet orally. She has been trying to eat multiple small meals. She has lost about 20 pounds. She does have osteoporosis for which she was taking oral calcium and vitamin D. However, she has stopped this with her recent diagnosis and surgeries, and I assured her that this would be safe to resume. She refuses placement of a port and wants chemotherapy given through a peripheral vein. Even if we go to the mildest of chemotherapies such as gemcitabine, this is very irritating to the vein, and likely to cause phlebitis. She is still insistent on using the vein. Hemoglobin is 10.2, platelets are 473,000 and white count is normal. Chemistries are remarkable for a sodium of 133. She denies fever, chills or other signs of infection.  She  denies vomiting, bowel issues, or abdominal pain.  She denies sore throat, cough, dyspnea, or chest pain.  HISTORY:   Past Medical History:  Diagnosis Date   Appendicitis, acute 11/19/2012   Arrhythmia    Arthritis    Back pain    Cardiac arrhythmia    Colles' fracture of right radius, init for clos fx 07/09/2017   Diabetes ([place])    Diverticulosis    FH: cholecystectomy    Gastroesophageal reflux disease    GERD (gastroesophageal reflux disease) 11/19/2012   Headache    HTN (hypertension)    Lipoma    Scalp   Migraines    Osteoporosis    Pain in right wrist 07/09/2017   Unspecified essential hypertension 11/19/2012    Past Surgical History:  Procedure Laterality Date   CESAREAN SECTION     CHOLECYSTECTOMY     GASTROJEJUNOSTOMY N/A 04/08/2021   Procedure: OPEN  GASTROJEJUNOSTOMY BYPASS;  Surgeon: Dwan Bolt, MD;  Location: [facility];  Service: General;  Laterality: N/A;   IR [place] TUBE CHANGE  05/05/2021   LAPAROSCOPIC APPENDECTOMY N/A 11/16/2012   Procedure: APPENDECTOMY LAPAROSCOPIC;  Surgeon: Imogene Burn. Georgette Dover, MD;  Location: [facility];  Service: General;  Laterality: N/A;   LAPAROSCOPIC LYSIS OF ADHESIONS Bilateral 11/16/2012   Procedure: LAPAROSCOPIC LYSIS OF ADHESIONS;  Surgeon: Imogene Burn. Georgette Dover, MD;  Location: [facility];  Service: General;  Laterality: Bilateral;   TONSILLECTOMY      Family History  Problem Relation Age of Onset   Stroke Mother 82       deceased   Hypertension Mother    Diabetes Mellitus I Mother    Lung cancer Father 64       deceased   Hypertension Sister    Uterine cancer Sister    Stomach cancer Brother     Social History:  reports that she has never smoked. She has never used smokeless tobacco. She reports that she does not drink alcohol and does not use drugs.The patient is accompanied by her husband today. She is married and lives at home with her spouse.   Allergies:  Allergies  Allergen Reactions   Iodinated Contrast Media Swelling    Pt had  swelling after getting IV contrast during an IVP years ago   Naratriptan Anaphylaxis    Rash, difficult time breathing and swallowing   Penicillins Other (See Comments)    Numbness of mouth and dry mouth Tolerated Cephalosporin Date: 04/19/21.     Iodine Swelling   Ciprofloxacin     Other reaction(s): Other (See Comments) Tendon pain   Duragesic-100 [Fentanyl] Other (See Comments)    Spouse states pt had trouble breathing and stomach began to swell (patch specific)   Penicillin G Rash    Current Medications: Current Outpatient Medications  Medication Sig Dispense Refill   Calcium Carbonate-Vitamin D 600-200 MG-UNIT TABS Take 1 tablet by mouth 2 (two) times daily with a meal.     CAMPHOR EX Apply 1 application topically as needed (pain).     Carboxymethylcellulose Sodium (DRY EYE RELIEF OP) Apply 1 drop to eye as needed (Dryness).     diltiazem (TIAZAC) 120 MG 24 hr capsule Do not take till you talk with your Medical doctor about her blood pressure (Patient taking differently: Take 120 mg by mouth daily.)     hydrOXYzine (ATARAX) 10 MG tablet Take 1 tablet (10 mg total) by mouth every 8 (eight) hours as needed for up to 10 days for anxiety. 30 tablet 0   MACROBID 100 MG capsule Take 100 mg by mouth daily.     magnesium hydroxide (MILK OF MAGNESIA) 800 MG/5ML suspension Take 30 mLs by mouth daily.     meclizine (ANTIVERT) 25 MG tablet Take 25 mg by mouth daily as needed for dizziness.     MOVANTIK 12.5 MG TABS tablet Take 12.5 mg by mouth daily as needed (constipation).     nitroGLYCERIN (NITROSTAT) 0.4 MG SL tablet Place 0.4 mg under the tongue every 5 (five) minutes as needed for chest pain.     Nutritional Supplements (FEEDING SUPPLEMENT, OSMOLITE 1.5 CAL,) LIQD 1,000 mLs by Per J Tube route daily. Okay for jevity instead of osmolite 7000 mL 2   Nutritional Supplements (FEEDING SUPPLEMENT, PROSOURCE TF,) liquid Place 45 mLs into feeding tube 2 (two) times daily. 2700 mL 0    ondansetron (ZOFRAN-ODT) 4 MG disintegrating tablet Take 1 tablet (4 mg  total) by mouth every 8 (eight) hours as needed for nausea. 20 tablet 0   oxyCODONE (OXY IR/ROXICODONE) 5 MG immediate release tablet Take 5-10 mg by mouth every 8 (eight) hours as needed.     polyethylene glycol (MIRALAX) 17 g packet Take 17 g by mouth daily as needed for mild constipation. 14 each 0   PROTONIX 40 MG tablet Take 40 mg by mouth 2 (two) times daily.     RABEprazole (ACIPHEX) 20 MG tablet Take 20 mg by mouth 2 (two) times daily.     senna-docusate (SENOKOT-S) 8.6-50 MG tablet Take 2 tablets by mouth at bedtime for 10 days. 20 tablet 0   sucralfate (CARAFATE) 1 GM/10ML suspension Take 1 g by mouth 2 (two) times daily.     No current facility-administered medications for this visit.    REVIEW OF SYSTEMS:  Review of Systems  Constitutional: Negative.  Negative for appetite change, chills, fatigue, fever and unexpected weight change.  HENT:  Negative.    Eyes: Negative.   Respiratory: Negative.  Negative for chest tightness, cough, hemoptysis, shortness of breath and wheezing.   Cardiovascular: Negative.  Negative for chest pain, leg swelling and palpitations.  Gastrointestinal:  Positive for nausea. Negative for abdominal distention, abdominal pain, blood in stool, constipation, diarrhea and vomiting.  Endocrine: Negative.   Genitourinary: Negative.  Negative for difficulty urinating, dysuria, frequency and hematuria.   Musculoskeletal: Negative.  Negative for arthralgias, back pain, flank pain, gait problem and myalgias.  Skin: Negative.   Neurological:  Positive for headaches. Negative for dizziness, extremity weakness, gait problem, light-headedness, numbness, seizures and speech difficulty.  Hematological: Negative.   Psychiatric/Behavioral: Negative.  Negative for depression and sleep disturbance. The patient is not nervous/anxious.     Pain of the feeding tube site VITALS:  Blood pressure 132/79,  pulse 100, temperature 98.2 F (36.8 C), temperature source Oral, resp. rate 18, height 4\' 8"  (1.422 m), weight 110 lb 1.6 oz (49.9 kg), SpO2 98 %.  Wt Readings from Last 3 Encounters:  05/18/21 110 lb 1.6 oz (49.9 kg)  05/08/21 112 lb 14 oz (51.2 kg)  04/19/21 117 lb 11.6 oz (53.4 kg)    Body mass index is 24.68 kg/m.  Performance status (ECOG): 1 - Symptomatic but completely ambulatory  PHYSICAL EXAM:  Physical Exam Constitutional:      General: She is not in acute distress.    Appearance: Normal appearance. She is normal weight.  HENT:     Head: Normocephalic and atraumatic.  Eyes:     General: No scleral icterus.    Extraocular Movements: Extraocular movements intact.     Conjunctiva/sclera: Conjunctivae normal.     Pupils: Pupils are equal, round, and reactive to light.  Cardiovascular:     Rate and Rhythm: Normal rate and regular rhythm.     Pulses: Normal pulses.     Heart sounds: Normal heart sounds. No murmur heard.   No friction rub. No gallop.  Pulmonary:     Effort: Pulmonary effort is normal. No respiratory distress.     Breath sounds: Normal breath sounds.  Abdominal:     General: Bowel sounds are normal. There is no distension.     Palpations: Abdomen is soft. There is no hepatomegaly, splenomegaly or mass.     Tenderness: There is abdominal tenderness (of the central abdomen and left upper quadrant).     Comments: She has a feeding tube in place in the mid epigastrium  Musculoskeletal:  General: Normal range of motion.     Cervical back: Normal range of motion and neck supple.     Right lower leg: No edema.     Left lower leg: No edema.  Lymphadenopathy:     Cervical: No cervical adenopathy.  Skin:    General: Skin is warm and dry.  Neurological:     General: No focal deficit present.     Mental Status: She is alert and oriented to person, place, and time. Mental status is at baseline.  Psychiatric:        Mood and Affect: Mood normal.         Behavior: Behavior normal.        Thought Content: Thought content normal.        Judgment: Judgment normal.     LABS:   CBC Latest Ref Rng & Units 05/18/2021 05/06/2021 05/05/2021  WBC - 7.4 8.4 15.4(H)  Hemoglobin 12.0 - 16.0 10.2(A) 8.4(L) 10.0(L)  Hematocrit 36 - 46 29(A) 24.2(L) 28.9(L)  Platelets 150 - 399 473(A) 218 271   CMP Latest Ref Rng & Units 05/18/2021 05/06/2021 05/05/2021  Glucose 70 - 99 mg/dL - 127(H) 136(H)  BUN 4 - 21 14 19 23   Creatinine 0.5 - 1.1 1.0 0.88 0.96  Sodium 137 - 147 133(A) 131(L) 129(L)  Potassium 3.4 - 5.3 4.4 4.0 4.8  Chloride 99 - 108 100 100 95(L)  CO2 13 - 22 25(A) 23 24  Calcium 8.7 - 10.7 9.0 8.1(L) 8.4(L)  Total Protein 6.5 - 8.1 g/dL - - 5.3(L)  Total Bilirubin 0.3 - 1.2 mg/dL - - 1.2  Alkaline Phos 25 - 125 99 - 100  AST 13 - 35 29 - 35  ALT 7 - 35 17 - 13     Lab Results  Component Value Date   CEA1 13.5 (H) 03/24/2021   /  CEA  Date Value Ref Range Status  03/24/2021 13.5 (H) 0.0 - 4.7 ng/mL Final    Comment:    (NOTE)                             Nonsmokers          <3.9                             Smokers             <5.6 [employer] Electrochemiluminescence Immunoassay (ECLIA) Values obtained with different assay methods or kits cannot be used interchangeably.  Results cannot be interpreted as absolute evidence of the presence or absence of malignant disease. Performed At: [facility] [place], [place] [id number] Rush Farmer MD RD:[id number]    No results found for: PSA1 Lab Results  Component Value Date   UDJ497 026 (H) 03/24/2021   No results found for: VZC588  No results found for: TOTALPROTELP, ALBUMINELP, A1GS, A2GS, BETS, BETA2SER, GAMS, MSPIKE, SPEI Lab Results  Component Value Date   FERRITIN 34 08/22/2020   No results found for: LDH  STUDIES:  CT ABDOMEN PELVIS WO CONTRAST  Result Date: 04/28/2021 CLINICAL DATA:  Postop, abdominal pain, concern for sepsis EXAM: CT  ABDOMEN AND PELVIS WITHOUT CONTRAST TECHNIQUE: Multidetector CT imaging of the abdomen and pelvis was performed following the standard protocol without IV contrast. RADIATION DOSE REDUCTION: This exam was performed according to the departmental dose-optimization program which includes automated exposure control,  adjustment of the mA and/or kV according to patient size and/or use of iterative reconstruction technique. COMPARISON:  04/28/2021, 04/14/2021 FINDINGS: Lower chest: No acute pleural or parenchymal lung disease. Hepatobiliary: Gallbladder is surgically absent. Unremarkable unenhanced appearance of the liver. Pancreas: Stable pancreatic atrophy. Ill-defined 3.8 x 3.4 cm soft tissue mass centered between the pancreatic tail and distal duodenum again noted, not appreciably changed. Spleen: Unremarkable unenhanced appearance. Adrenals/Urinary Tract: Stable severe left-sided hydronephrosis and left renal atrophy. This is likely due to infiltration or extrinsic mass effect upon the proximal left ureter by the ill-defined mass described above. Right kidney is stable. Bladder is decompressed, limiting its evaluation. The adrenals are unremarkable. Stomach/Bowel: A percutaneous gastrojejunostomy tube is identified, distal tip extending through the gastrojejunostomy and residing within the proximal jejunal limb. There is moderate distention of the stomach, proximal duodenum, and efferent jejunal limb distal to the gastrojejunostomy. The distended jejunal limb extends into the lower pelvis, measuring up to 3.3 cm in diameter. Gas fluid levels are identified. The distal jejunum and ileum are decompressed. Minimal retained stool within the colon. Vascular/Lymphatic: Aortic atherosclerosis. No enlarged abdominal or pelvic lymph nodes. Reproductive: Uterus and bilateral adnexa are unremarkable. Other: No free fluid or free intraperitoneal gas. No abdominal wall hernia. Musculoskeletal: No acute or destructive bony lesions.  Reconstructed images demonstrate no additional findings. IMPRESSION: 1. Postsurgical changes from gastrojejunostomy, with percutaneous gastrojejunostomy tube extending through the gastrojejunostomy site and residing within the proximal aspect of the efferent jejunal limb. 2. Moderate distension of the stomach, proximal duodenum, and efferent jejunal limb as above, to the level of the mid lower pelvis. Findings are concerning for developing small bowel obstruction involving the efferent jejunal limb, versus postoperative ileus. 3. Stable ill-defined mass centered between the pancreatic tail and distal duodenum, with mass effect upon the duodenum and obstruction of the proximal left ureter as described above. 4. Stable severe left hydronephrosis and left renal atrophy. 5.  Aortic Atherosclerosis (ICD10-I70.0). Electronically Signed   By: Randa Ngo M.D.   On: 04/28/2021 15:45   IR GJ Tube Change  Result Date: 05/05/2021 INDICATION: History of pancreatic cancer with complete obstruction of the horizontal segment of the duodenum, post gastrojejunostomy surgical bypass with placement of a feeding gastrojejunostomy tube. Unfortunately, the surgically placed gastrojejunostomy jejunal lumen is now coiled with the entirety within the gastric lumen. Patient now presents for attempted fluoroscopic guided replacement. EXAM: FLUOROSCOPIC GUIDED REPLACEMENT OF GASTROJEJUNOSTOMY TUBE COMPARISON:  CT abdomen pelvis-04/28/2021; abdominal radiograph-earlier same day MEDICATIONS: None. CONTRAST:  27mL OMNIPAQUE IOHEXOL 300 MG/ML SOLN administered into the gastric lumen and proximal small bowel. FLUOROSCOPY TIME:  11 minutes (36 seconds) COMPLICATIONS: None. PROCEDURE: Informed written consent was obtained from the patient after a discussion of the risks and benefits. The upper abdomen and the external portion of the existing gastrojejunostomy tube was prepped and draped in the usual sterile fashion, and a sterile drape was  applied covering the operative field. Maximum barrier sterile technique with sterile gowns and gloves were used for the procedure. A timeout was performed prior to the initiation of the procedure. Both lumens of the gastrojejunostomy catheter with injection confirming both were positioned within the gastric lumen. As such, the external portion of the surgically placed gastrojejunostomy tube was cut and the retention balloon was deflated. The gastrojejunostomy tube was partially retracted and then the jejunal lumen was cut and cannulated with a stiff Glidewire which was cannulated within the gastric lumen Under intermittent fluoroscopic guidance, the remainder of the [employer] tube was  exchanged for a C2 catheter which was advanced through the gastric antrum to the level of the proximal duodenum. Attempts were made to traverse the obstructed portion of the horizontal segment of the duodenum however this ultimately proved unsuccessful. Next, the C2 catheter was utilized to select the surgical gastrojejunostomy. Contrast injection confirmed appropriate positioning Next, over a regular glidewire, the C2 catheter was exchanged for a 79 French balloon retention gastrojejunostomy catheter with tip ultimately terminating within the proximal/mid small bowel though there is some redundant jejunal lumen within the gastric fundus. The retention balloon was insufflated with approximately 10 cc of saline and dilute contrast. The external disc was cinched. Contrast injection confirmed appropriate positioning and functionality of both the gastric and jejunal lumens. A dressing was applied. The patient tolerated the procedure well without immediate postprocedural complication. IMPRESSION: Technically successful fluoroscopic guided replacement and repositioning 20 French balloon retention gastrojejunostomy catheter with weighted tip of the jejunal lumen terminating through the surgical gastrojejunostomy anastomosis within the proximal/mid  small bowel though note is made of some redundant jejunal lumen coiled within the gastric fundus. Both lumens are ready for immediate usage. Electronically Signed   By: Sandi Mariscal M.D.   On: 05/05/2021 16:07   DG CHEST PORT 1 VIEW  Result Date: 05/05/2021 CLINICAL DATA:  Tachycardic this morning. Bilateral leg and right arm swelling. EXAM: PORTABLE CHEST 1 VIEW COMPARISON:  AP chest 04/11/2021 FINDINGS: Cardiac silhouette is again at the upper limits of normal size. Mediastinal contours are within normal limits with calcification seen within the aortic arch. New opacity within the right lung mid height mid to lateral transverse location. The left lung is clear. No definite pleural effusion. No pneumothorax. Cholecystectomy clips. Bilateral catheter tubing overlies the partially visualized kidneys. Moderate multilevel degenerative disc changes. IMPRESSION: New right mid lung heterogeneous airspace opacity concerning for pneumonia. Electronically Signed   By: Yvonne Kendall M.D.   On: 05/05/2021 10:18   DG Abdomen Acute W/Chest  Result Date: 04/28/2021 CLINICAL DATA:  Postoperative, abdominal pain, concern for sepsis EXAM: DG ABDOMEN ACUTE WITH 1 VIEW CHEST COMPARISON:  04/13/2021 FINDINGS: Percutaneous gastrojejunostomy tube. Mildly distended air and fluid-filled loops of small bowel in the low central abdomen, measuring up to 4.0 cm in caliber. Scattered gas and stool present to the descending colon. No free air. No radiopaque calculi or other significant radiographic abnormality is seen. Heart size and mediastinal contours are within normal limits. Both lungs are clear. IMPRESSION: 1. Mildly distended air and fluid-filled loops of small bowel in the low central abdomen, measuring up to 4.0 cm in caliber. Scattered gas and stool present to the descending colon. Findings suggest postoperative ileus or developing small bowel obstruction. No free air. 2.  No acute abnormality of the lungs. Electronically Signed    By: Delanna Ahmadi M.D.   On: 04/28/2021 13:56   DG Abd Portable 1V  Result Date: 05/08/2021 CLINICAL DATA:  Feeding tube. EXAM: PORTABLE ABDOMEN - 1 VIEW COMPARISON:  05/05/2021 FINDINGS: Feeding tube is the scattered in some regions by contrast material in the bowel. The tip of the catheter cannot be confidently identified. Bowel gas pattern is nonspecific. IMPRESSION: Feeding tube tip cannot be confidently identified on this study due to contrast material within bowel loops. Electronically Signed   By: Misty Stanley M.D.   On: 05/08/2021 13:58   DG Abd Portable 1V  Result Date: 05/05/2021 CLINICAL DATA:  Severe abdominal pain, left-greater-than-right. Decreased urine output. Peg tube is to be adjusted later today.  EXAM: PORTABLE ABDOMEN - 1 VIEW COMPARISON:  CT abdomen and pelvis and KUB 04/28/2021 FINDINGS: Percutaneous gastrojejunostomy tube is again noted. The distal tip is horizontal in orientation and may overlie the third portion of the duodenum, however this appears close to portions of the tubing extending along likely the greater curvature of the stomach not exclude that the tip could be also overlying the stomach. Nonobstructive bowel-gas pattern. Air is seen within the colon and rectum. No portal venous gas or pneumatosis. The lung bases are clear. Mild multilevel degenerative disc changes of the thoracic spine. IMPRESSION:: IMPRESSION: 1. Nonobstructed bowel-gas pattern. 2. Gastrojejunostomy tube is again noted. It is difficult to determine whether the distal tip is in the third portion of the duodenum versus the stomach. The tip was within the third portion of the duodenum on recent 04/28/2021 CT. Electronically Signed   By: Yvonne Kendall M.D.   On: 05/05/2021 10:25      EXAM: 03/15/2021 CT ABDOMEN AND PELVIS WITHOUT CONTRAST   TECHNIQUE:  Multidetector CT imaging of the abdomen and pelvis was performed  following the standard protocol without IV contrast.   COMPARISON: None.    FINDINGS:  Lower chest: No acute abnormality.  Hepatobiliary: No focal liver abnormality is seen. Status post  cholecystectomy. No biliary dilatation.  Pancreas: Mild pancreatic atrophy. Solid-appearing 3.9 x 3.6 cm mass  appears to arise from the inferior pancreatic body (series 2, image  38). No ductal dilatation or surrounding inflammatory changes.  Spleen: Normal in size without focal abnormality.  Adrenals/Urinary Tract: The adrenal glands are unremarkable.  Moderate left renal atrophy with severe hydronephrosis to the UPJ.  The left ureter is normal. Mild right hydroureter without  hydronephrosis. No renal calculi. The bladder is unremarkable.  Stomach/Bowel: Stomach is within normal limits. Prior appendectomy.  No evidence of bowel wall thickening, distention, or inflammatory  changes. Left-sided colonic diverticulosis.  Vascular/Lymphatic: Aortic atherosclerosis. No enlarged abdominal or  pelvic lymph nodes.  Reproductive: Uterus and bilateral adnexa are unremarkable.  Other: No abdominal wall hernia or abnormality. No abdominopelvic  ascites. No pneumoperitoneum.  Musculoskeletal: No acute or significant osseous findings.   IMPRESSION:  1. No acute intra-abdominal process.  2. Solid-appearing 3.9 cm mass appears to arise from the inferior  pancreatic body, concerning for pancreatic neoplasm. Recommend  further evaluation with contrast-enhanced pancreatic protocol CT or  MRI of the abdomen with and without contrast.  3. Moderate left renal atrophy with severe hydronephrosis to the  UPJ, consistent with chronic UPJ obstruction.  4. Mild right hydroureter without hydronephrosis. No obstructing  calculi.  5. Aortic Atherosclerosis (ICD10-I70.0).    EXAM: 03/16/2021 MRI ABDOMEN WITHOUT AND WITH CONTRAST   TECHNIQUE:  Multiplanar multisequence MR imaging of the abdomen was performed  both before and after the administration of intravenous contrast.   CONTRAST: 5 mL  Gadavist   COMPARISON: Noncontrast CT on 03/15/2021   FINDINGS:  Lower chest: No acute findings.  Hepatobiliary: No hepatic masses identified. Tiny sub-cm cyst noted  medial segment of left hepatic lobe. Prior cholecystectomy. No  evidence of biliary obstruction.  Pancreas: A ill-defined hypovascular mass is seen which involves the  pancreatic tail, distal duodenum, and left UPJ. This measures  approximately 3.3 x 3.5 cm on image [id number].  Spleen: Within normal limits in size and appearance.  Adrenals/Urinary Tract: No adrenal or renal masses identified.  Moderate diffuse left renal parenchymal atrophy is seen with severe  left hydronephrosis. This is due to the mass described above which  involves the area of the left UPJ.  Stomach/Bowel: Distended stomach and proximal duodenum due to soft  tissue mass involving the distal duodenum, as described above.  Vascular/Lymphatic: No pathologically enlarged lymph nodes  identified. No acute vascular findings.  Other: None.  Musculoskeletal: No suspicious bone lesions identified.   IMPRESSION:  Ill-defined hypovascular mass involving the pancreatic tail, distal  duodenum causing duodenal obstruction, and left UPJ causing moderate  left hydronephrosis. Differential diagnosis includes primary  pancreatic carcinoma, duodenal carcinoma, and metastatic disease.  No other sites of neoplasm identified within the abdomen.    I, Rita Ohara, am acting as scribe for Derwood Kaplan, MD  I have reviewed this report as typed by the medical scribe, and it is complete and accurate.

## 2021-05-18 ENCOUNTER — Encounter: Payer: Self-pay | Admitting: Oncology

## 2021-05-18 ENCOUNTER — Other Ambulatory Visit: Payer: Self-pay

## 2021-05-18 ENCOUNTER — Inpatient Hospital Stay: Payer: Medicare Other

## 2021-05-18 ENCOUNTER — Inpatient Hospital Stay: Payer: Medicare Other | Attending: Oncology | Admitting: Oncology

## 2021-05-18 ENCOUNTER — Other Ambulatory Visit: Payer: Self-pay | Admitting: Oncology

## 2021-05-18 VITALS — BP 132/79 | HR 100 | Temp 98.2°F | Resp 18 | Ht <= 58 in | Wt 110.1 lb

## 2021-05-18 DIAGNOSIS — C252 Malignant neoplasm of tail of pancreas: Secondary | ICD-10-CM | POA: Insufficient documentation

## 2021-05-18 DIAGNOSIS — D649 Anemia, unspecified: Secondary | ICD-10-CM | POA: Diagnosis not present

## 2021-05-18 DIAGNOSIS — R519 Headache, unspecified: Secondary | ICD-10-CM | POA: Insufficient documentation

## 2021-05-18 DIAGNOSIS — Z79899 Other long term (current) drug therapy: Secondary | ICD-10-CM | POA: Diagnosis not present

## 2021-05-18 DIAGNOSIS — N1330 Unspecified hydronephrosis: Secondary | ICD-10-CM | POA: Insufficient documentation

## 2021-05-18 DIAGNOSIS — C259 Malignant neoplasm of pancreas, unspecified: Secondary | ICD-10-CM

## 2021-05-18 DIAGNOSIS — M7989 Other specified soft tissue disorders: Secondary | ICD-10-CM | POA: Insufficient documentation

## 2021-05-18 DIAGNOSIS — K315 Obstruction of duodenum: Secondary | ICD-10-CM | POA: Insufficient documentation

## 2021-05-18 DIAGNOSIS — M810 Age-related osteoporosis without current pathological fracture: Secondary | ICD-10-CM | POA: Insufficient documentation

## 2021-05-18 DIAGNOSIS — R000 Tachycardia, unspecified: Secondary | ICD-10-CM | POA: Insufficient documentation

## 2021-05-18 DIAGNOSIS — D539 Nutritional anemia, unspecified: Secondary | ICD-10-CM | POA: Insufficient documentation

## 2021-05-18 LAB — BASIC METABOLIC PANEL
BUN: 14 (ref 4–21)
CO2: 25 — AB (ref 13–22)
Chloride: 100 (ref 99–108)
Creatinine: 1 (ref 0.5–1.1)
Glucose: 133
Potassium: 4.4 (ref 3.4–5.3)
Sodium: 133 — AB (ref 137–147)

## 2021-05-18 LAB — CBC: RBC: 3.39 — AB (ref 3.87–5.11)

## 2021-05-18 LAB — COMPREHENSIVE METABOLIC PANEL
Albumin: 3.8 (ref 3.5–5.0)
Calcium: 9 (ref 8.7–10.7)

## 2021-05-18 LAB — CBC AND DIFFERENTIAL
HCT: 29 — AB (ref 36–46)
Hemoglobin: 10.2 — AB (ref 12.0–16.0)
Neutrophils Absolute: 6.29
Platelets: 473 — AB (ref 150–399)
WBC: 7.4

## 2021-05-18 LAB — HEPATIC FUNCTION PANEL
ALT: 17 (ref 7–35)
AST: 29 (ref 13–35)
Alkaline Phosphatase: 99 (ref 25–125)
Bilirubin, Total: 0.2

## 2021-05-19 ENCOUNTER — Telehealth: Payer: Self-pay | Admitting: Dietician

## 2021-05-19 DIAGNOSIS — M7989 Other specified soft tissue disorders: Secondary | ICD-10-CM | POA: Diagnosis not present

## 2021-05-19 DIAGNOSIS — C252 Malignant neoplasm of tail of pancreas: Secondary | ICD-10-CM | POA: Diagnosis not present

## 2021-05-19 DIAGNOSIS — R000 Tachycardia, unspecified: Secondary | ICD-10-CM | POA: Diagnosis not present

## 2021-05-19 DIAGNOSIS — R519 Headache, unspecified: Secondary | ICD-10-CM | POA: Diagnosis not present

## 2021-05-19 DIAGNOSIS — M810 Age-related osteoporosis without current pathological fracture: Secondary | ICD-10-CM | POA: Diagnosis not present

## 2021-05-19 DIAGNOSIS — D649 Anemia, unspecified: Secondary | ICD-10-CM | POA: Diagnosis not present

## 2021-05-19 LAB — IRON AND TIBC
Iron: 62 ug/dL (ref 28–170)
Saturation Ratios: 20 % (ref 10.4–31.8)
TIBC: 307 ug/dL (ref 250–450)
UIBC: 245 ug/dL

## 2021-05-19 LAB — FOLATE: Folate: >100 ng/mL (ref >5.9)

## 2021-05-19 LAB — VITAMIN B12: Vitamin B-12: 1004 pg/mL — ABNORMAL HIGH (ref 180–914)

## 2021-05-19 LAB — CANCER ANTIGEN 19-9: CA 19-9: 77 U/mL — ABNORMAL HIGH (ref 0–35)

## 2021-05-19 LAB — FERRITIN: Ferritin: 41 ng/mL (ref 11–307)

## 2021-05-19 NOTE — Telephone Encounter (Signed)
Nutrition Assessment   Reason for Assessment: Referral from Dr. Hinton Rao patient has a J tube which had migrated to stomach and has now been repositioned to jejunum.   ASSESSMENT: Patient is a 76 year old female with pancreatic adenocarcinoma. She doesn't speak English but her husband does.  I spoke with husband  over the phone and he reports there was too much going on yesterday and he didn't try to hook her up to the TF pump for feeds. She is eating small amounts of fruits, vegetables, yogurt, and beans. She is drinking lots of fluids including fruit juices and water and gets at least 2-3 bottles a day. He states he has flushed tube each morning  with no concerns since tube was repositioned.  Medications: Protonix, Macrobid, Miralax, Zofran  Labs: 05/18/21 BUN & Creat WNL, Vit B12 1004, Hgb 10.2   Anthropometrics: Weight loss of 15#, 13.3% past 3 months  Height: 56" Weight:  05/18/21  110# 04/19/21 117.75# 02/23/21   125.2# UBW:  BMI: 24.68   Estimated Energy Needs  Kcals: 1500-1750 Protein:  50-60 Fluid: >1500 ml   NUTRITION DIAGNOSIS:  Inadequate PO and enteral intake related to cancer, and complications with tube placement AEB significant weight loss and inability to use enteral feeds as reported by spouse.   MALNUTRITION DIAGNOSIS: suspect sever malnutrition of chronic illness, unable to perform NFPE   INTERVENTION: Encouraged resuming TF at 20 ml per hour for 15 hours nocturnally after evening meal, if tolerated increase by 10 mL per hour. Will assess tolerance and PO intake for goal rate.  Goal TF for 100% Enteral nutrition: Osmolite 1.5 via pump at 55 mL per hour x 20 hours with  240 ml FWF TID to provide 1100 ml formula,1650 calories, 69 g pro, 100% DRI vits and minerals, 1550 mL water   MONITORING, EVALUATION, GOAL: Enteral tolerance, PO intake, Weight, Labs   Next Visit: Telephone 05/24/21  April Manson, RDN, LDN Registered Dietitian, [place] Part Time Remote (Usual office hours: Tuesday-Thursday) Cell: [phone]

## 2021-05-19 NOTE — Telephone Encounter (Signed)
Referral requested by Dr. Hinton Rao to help with tube feeding concerns. First attempt to reach.  Spouse requested I all back in and hour, he is showering  patient.  April Manson, RDN, LDN Registered Dietitian, [place] Part Time Remote (Usual office hours: Tuesday-Thursday) Cell: [phone]

## 2021-05-24 ENCOUNTER — Telehealth: Payer: Self-pay | Admitting: Dietician

## 2021-05-24 NOTE — Telephone Encounter (Signed)
Nutrition Follow Up TF Tolerance and Progress Spoke with patient's husband at home phone who speaks [place]. He reports she is eating every 2-3 hours with no nausea and discomfort if she eats small amounts.  She can eat a handful of rice 3-4 spoons of fish or pasta at one feeding and she is drinking a bottle of water or coconut water or some milk between feeds. So far today she had around 3:53 am 2 slices of bread, orange juice 8 oz and handful of grapes, then at 11 am 2-3 tablespoon [place] yogurt, fruit, apple juice 5 oz wit some coconut water in between. He said she is not tolerating TF and he is only trying to run them 10 mL/hr. He said he started it the other day and she had pain after 7 hours. He stopped the feed at 10 and has not resumed feeds since. He is flushing each day and she is not having discomfort with flushes. He said she tried drinking the Osmolite but she vomited. He states he has Ensure plus and she can tolerate that orally. He is going to have her switch to Ensure plus between feeds instead of plain water. Goal of 3 bottle of Ensure plus daily. He is going to attempt diluted TF  (50% dilution with water) at 10 mL per hour and assess pain.  Bowels are moving 3-4 per day and they are formed. I will call Thursday to follow up.

## 2021-05-25 DIAGNOSIS — C251 Malignant neoplasm of body of pancreas: Secondary | ICD-10-CM | POA: Diagnosis not present

## 2021-05-25 DIAGNOSIS — E43 Unspecified severe protein-calorie malnutrition: Secondary | ICD-10-CM | POA: Diagnosis not present

## 2021-05-25 DIAGNOSIS — Z48815 Encounter for surgical aftercare following surgery on the digestive system: Secondary | ICD-10-CM | POA: Diagnosis not present

## 2021-05-25 DIAGNOSIS — N1330 Unspecified hydronephrosis: Secondary | ICD-10-CM | POA: Diagnosis not present

## 2021-05-25 DIAGNOSIS — K315 Obstruction of duodenum: Secondary | ICD-10-CM | POA: Diagnosis not present

## 2021-05-25 DIAGNOSIS — Z434 Encounter for attention to other artificial openings of digestive tract: Secondary | ICD-10-CM | POA: Diagnosis not present

## 2021-05-26 ENCOUNTER — Ambulatory Visit: Payer: Medicare Other | Admitting: Dietician

## 2021-05-26 NOTE — Progress Notes (Signed)
Called patient's spouse to check tolerance of TF. He said he stopped trying to use her J-tube because each time he tries she gets nauseous and has bloating. He tried diluting formula and running with pump at 10 mL per hour and she has too much pain.   ?He reports she is eat well at this time, PO hasn't change or increased much since earlier in the week.  She is drinking Ensure plus 1-3 bottles/day.  She is coming for appointment on 3/6.  I told him I will call back on 05/31/21 to check weight. ?

## 2021-05-27 ENCOUNTER — Other Ambulatory Visit: Payer: Self-pay

## 2021-05-30 ENCOUNTER — Encounter: Payer: Self-pay | Admitting: Hematology and Oncology

## 2021-05-30 ENCOUNTER — Inpatient Hospital Stay: Payer: Medicare Other

## 2021-05-30 ENCOUNTER — Other Ambulatory Visit: Payer: Self-pay

## 2021-05-30 ENCOUNTER — Inpatient Hospital Stay: Payer: Medicare Other | Attending: Oncology | Admitting: Hematology and Oncology

## 2021-05-30 DIAGNOSIS — C259 Malignant neoplasm of pancreas, unspecified: Secondary | ICD-10-CM

## 2021-05-30 DIAGNOSIS — D539 Nutritional anemia, unspecified: Secondary | ICD-10-CM | POA: Diagnosis not present

## 2021-05-30 DIAGNOSIS — C252 Malignant neoplasm of tail of pancreas: Secondary | ICD-10-CM | POA: Insufficient documentation

## 2021-05-30 DIAGNOSIS — E43 Unspecified severe protein-calorie malnutrition: Secondary | ICD-10-CM

## 2021-05-30 DIAGNOSIS — D649 Anemia, unspecified: Secondary | ICD-10-CM | POA: Diagnosis not present

## 2021-05-30 LAB — CBC AND DIFFERENTIAL
HCT: 32 — AB (ref 36–46)
Hemoglobin: 10.6 — AB (ref 12.0–16.0)
Neutrophils Absolute: 3.65
Platelets: 346 (ref 150–399)
WBC: 4.8

## 2021-05-30 LAB — HEPATIC FUNCTION PANEL
ALT: 19 (ref 7–35)
AST: 29 (ref 13–35)
Alkaline Phosphatase: 90 (ref 25–125)
Bilirubin, Total: 0.4

## 2021-05-30 LAB — BASIC METABOLIC PANEL
BUN: 14 (ref 4–21)
CO2: 27 — AB (ref 13–22)
Chloride: 101 (ref 99–108)
Creatinine: 0.9 (ref 0.5–1.1)
Glucose: 127
Potassium: 5.1 (ref 3.4–5.3)
Sodium: 135 — AB (ref 137–147)

## 2021-05-30 LAB — COMPREHENSIVE METABOLIC PANEL
Albumin: 3.8 (ref 3.5–5.0)
Calcium: 9.1 (ref 8.7–10.7)

## 2021-05-30 LAB — CBC: RBC: 3.69 — AB (ref 3.87–5.11)

## 2021-05-30 NOTE — Assessment & Plan Note (Addendum)
Pancreatic adenocarcinoma, with a mass measuring 3.3 x 3.5 cm on MRI. Treatment options were reviewed today including chemotherapy and palliative care was also reviewed. She states multiple times that she does want to undergo chemotherapy. She is willing to discuss mediport placement with the surgeon. We have scheduled her for consult with Dr. Noberto Retort for 03/13 at 9:45 am. We reviewed gemcitabine, the schedule and side effects.  ?

## 2021-05-30 NOTE — Assessment & Plan Note (Addendum)
Feeding tube, which has had many complications and remains tender with manipulation. She is eating on her own and is not using the feeding tube. Her husband states that the few times they used it, the patient's stomach became distended. They are requesting that the feeding tube be removed. I advised that we are in favor of keeping the feeding tube during chemotherapy to maintain nutrition if she is unable to eat on her own. She is adamant that she does not want the feeding tube. ? ?

## 2021-05-30 NOTE — Progress Notes (Cosign Needed)
Patient Care Team: Street, [name], MD as PCP - General (Family Medicine)  Clinic Day:  05/30/2021  Referring physician: Street, Sharon Pruitt, *  ASSESSMENT & PLAN:   Assessment & Plan: Pancreatic cancer [facility]) Pancreatic adenocarcinoma, with a mass measuring 3.3 x 3.5 cm on MRI. Treatment options were reviewed today including chemotherapy and palliative care was also reviewed. She states multiple times that she does want to undergo chemotherapy. She is willing to discuss mediport placement with the surgeon. We have scheduled her for consult with Dr. Noberto Retort for 03/13 at 9:45 am. We reviewed gemcitabine, the schedule and side effects.   Protein-calorie malnutrition, severe Feeding tube, which has had many complications and remains tender with manipulation. She is eating on her own and is not using the feeding tube. Her husband states that the few times they used it, the patient's stomach became distended. They are requesting that the feeding tube be removed. I advised that we are in favor of keeping the feeding tube during chemotherapy to maintain nutrition if she is unable to eat on her own. She is adamant that she does not want the feeding tube.     The patient understands the plans discussed today and is in agreement with them.  She knows to contact our office if she develops concerns prior to her next appointment.    Melodye Ped, NP  [facility] [address] [place] [address] Dept: [phone] Dept Fax: [phone]   No orders of the defined types were placed in this encounter.     CHIEF COMPLAINT:  CC: A 76 year old female with history of pancreatic adenocarcinoma here for 2 week evaluation  Current Treatment:  Pending patient decision  INTERVAL HISTORY:  Leah Pruitt is here today for repeat clinical assessment. She denies fevers or chills. She denies pain. Her appetite is good. Her weight has  decreased 3 pounds over last 2 weeks .  I have reviewed the past medical history, past surgical history, social history and family history with the patient and they are unchanged from previous note.  ALLERGIES:  is allergic to iodinated contrast media, naratriptan, penicillins, iodine, ciprofloxacin, duragesic-100 [fentanyl], and penicillin g.  MEDICATIONS:  Current Outpatient Medications  Medication Sig Dispense Refill   Calcium Carbonate-Vitamin D 600-200 MG-UNIT TABS Take 1 tablet by mouth 2 (two) times daily with a meal.     CAMPHOR EX Apply 1 application topically as needed (pain).     Carboxymethylcellulose Sodium (DRY EYE RELIEF OP) Apply 1 drop to eye as needed (Dryness).     diltiazem (TIAZAC) 120 MG 24 hr capsule Do not take till you talk with your Medical doctor about her blood pressure (Patient taking differently: Take 120 mg by mouth daily.)     MACROBID 100 MG capsule Take 100 mg by mouth daily.     magnesium hydroxide (MILK OF MAGNESIA) 800 MG/5ML suspension Take 30 mLs by mouth daily.     meclizine (ANTIVERT) 25 MG tablet Take 25 mg by mouth daily as needed for dizziness.     MOVANTIK 12.5 MG TABS tablet Take 12.5 mg by mouth daily as needed (constipation).     nitroGLYCERIN (NITROSTAT) 0.4 MG SL tablet Place 0.4 mg under the tongue every 5 (five) minutes as needed for chest pain.     Nutritional Supplements (FEEDING SUPPLEMENT, OSMOLITE 1.5 CAL,) LIQD 1,000 mLs by Per J Tube route daily. Okay for jevity instead of osmolite (Patient not taking:  Reported on 05/30/2021) 7000 mL 2   Nutritional Supplements (FEEDING SUPPLEMENT, PROSOURCE TF,) liquid Place 45 mLs into feeding tube 2 (two) times daily. (Patient not taking: Reported on 05/30/2021) 2700 mL 0   olmesartan (BENICAR) 20 MG tablet Take 20 mg by mouth 2 (two) times daily.     ondansetron (ZOFRAN-ODT) 4 MG disintegrating tablet Take 1 tablet (4 mg total) by mouth every 8 (eight) hours as needed for nausea. 20 tablet 0   oxyCODONE  (OXY IR/ROXICODONE) 5 MG immediate release tablet Take 5-10 mg by mouth every 8 (eight) hours as needed.     polyethylene glycol (MIRALAX) 17 g packet Take 17 g by mouth daily as needed for mild constipation. (Patient not taking: Reported on 05/30/2021) 14 each 0   PROTONIX 40 MG tablet Take 40 mg by mouth 2 (two) times daily. (Patient not taking: Reported on 05/30/2021)     RABEprazole (ACIPHEX) 20 MG tablet Take 20 mg by mouth 2 (two) times daily.     sucralfate (CARAFATE) 1 GM/10ML suspension Take 1 g by mouth 2 (two) times daily.     No current facility-administered medications for this visit.    HISTORY OF PRESENT ILLNESS:   Oncology History   No history exists.      REVIEW OF SYSTEMS:   Constitutional: Denies fevers, chills or abnormal weight loss Eyes: Denies blurriness of vision Ears, nose, mouth, throat, and face: Denies mucositis or sore throat Respiratory: Denies cough, dyspnea or wheezes Cardiovascular: Denies palpitation, chest discomfort or lower extremity swelling Gastrointestinal:  Denies nausea, heartburn or change in bowel habits Skin: Denies abnormal skin rashes Lymphatics: Denies new lymphadenopathy or easy bruising Neurological:Denies numbness, tingling or new weaknesses Behavioral/Psych: Mood is stable, no new changes  All other systems were reviewed with the patient and are negative.   VITALS:  Blood pressure 129/78, pulse (!) 101, temperature 98 F (36.7 C), temperature source Oral, resp. rate 20, height '4\' 7"'$  (1.397 m), weight 107 lb 8 oz (48.8 kg), SpO2 98 %.  Wt Readings from Last 3 Encounters:  05/30/21 107 lb 8 oz (48.8 kg)  05/18/21 110 lb 1.6 oz (49.9 kg)  05/08/21 112 lb 14 oz (51.2 kg)    Body mass index is 24.99 kg/m.  Performance status (ECOG): 1 - Symptomatic but completely ambulatory  PHYSICAL EXAM:   GENERAL:alert, no distress and comfortable SKIN: skin color, texture, turgor are normal, no rashes or significant lesions EYES: normal,  Conjunctiva are pink and non-injected, sclera clear OROPHARYNX:no exudate, no erythema and lips, buccal mucosa, and tongue normal  NECK: supple, thyroid normal size, non-tender, without nodularity LYMPH:  no palpable lymphadenopathy in the cervical, axillary or inguinal LUNGS: clear to auscultation and percussion with normal breathing effort HEART: regular rate & rhythm and no murmurs and no lower extremity edema ABDOMEN:abdomen soft, non-tender and normal bowel sounds Musculoskeletal:no cyanosis of digits and no clubbing  NEURO: alert & oriented x 3 with fluent speech, no focal motor/sensory deficits  LABORATORY DATA:  I have reviewed the data as listed    Component Value Date/Time   NA 135 (A) 05/30/2021 0000   K 5.1 05/30/2021 0000   CL 101 05/30/2021 0000   CO2 27 (A) 05/30/2021 0000   GLUCOSE 127 (H) 05/06/2021 0649   BUN 14 05/30/2021 0000   CREATININE 0.9 05/30/2021 0000   CREATININE 0.88 05/06/2021 0649   CALCIUM 9.1 05/30/2021 0000   PROT 5.3 (L) 05/05/2021 0951   ALBUMIN 3.8 05/30/2021 0000  AST 29 05/30/2021 0000   ALT 19 05/30/2021 0000   ALKPHOS 90 05/30/2021 0000   BILITOT 1.2 05/05/2021 0951   GFRNONAA >60 05/06/2021 0649   GFRAA >60 10/11/2016 1100    No results found for: SPEP, UPEP  Lab Results  Component Value Date   WBC 4.8 05/30/2021   NEUTROABS 3.65 05/30/2021   HGB 10.6 (A) 05/30/2021   HCT 32 (A) 05/30/2021   MCV 86.7 05/06/2021   PLT 346 05/30/2021      Chemistry      Component Value Date/Time   NA 135 (A) 05/30/2021 0000   K 5.1 05/30/2021 0000   CL 101 05/30/2021 0000   CO2 27 (A) 05/30/2021 0000   BUN 14 05/30/2021 0000   CREATININE 0.9 05/30/2021 0000   CREATININE 0.88 05/06/2021 0649   GLU 127 05/30/2021 0000      Component Value Date/Time   CALCIUM 9.1 05/30/2021 0000   ALKPHOS 90 05/30/2021 0000   AST 29 05/30/2021 0000   ALT 19 05/30/2021 0000   BILITOT 1.2 05/05/2021 0951       RADIOGRAPHIC STUDIES: I have  personally reviewed the radiological images as listed and agreed with the findings in the report. IR GJ Tube Change  Result Date: 05/05/2021 INDICATION: History of pancreatic cancer with complete obstruction of the horizontal segment of the duodenum, post gastrojejunostomy surgical bypass with placement of a feeding gastrojejunostomy tube. Unfortunately, the surgically placed gastrojejunostomy jejunal lumen is now coiled with the entirety within the gastric lumen. Patient now presents for attempted fluoroscopic guided replacement. EXAM: FLUOROSCOPIC GUIDED REPLACEMENT OF GASTROJEJUNOSTOMY TUBE COMPARISON:  CT abdomen pelvis-04/28/2021; abdominal radiograph-earlier same day MEDICATIONS: None. CONTRAST:  49m OMNIPAQUE IOHEXOL 300 MG/ML SOLN administered into the gastric lumen and proximal small bowel. FLUOROSCOPY TIME:  11 minutes (36 seconds) COMPLICATIONS: None. PROCEDURE: Informed written consent was obtained from the patient after a discussion of the risks and benefits. The upper abdomen and the external portion of the existing gastrojejunostomy tube was prepped and draped in the usual sterile fashion, and a sterile drape was applied covering the operative field. Maximum barrier sterile technique with sterile gowns and gloves were used for the procedure. A timeout was performed prior to the initiation of the procedure. Both lumens of the gastrojejunostomy catheter with injection confirming both were positioned within the gastric lumen. As such, the external portion of the surgically placed gastrojejunostomy tube was cut and the retention balloon was deflated. The gastrojejunostomy tube was partially retracted and then the jejunal lumen was cut and cannulated with a stiff Glidewire which was cannulated within the gastric lumen Under intermittent fluoroscopic guidance, the remainder of the GMonticellotube was exchanged for a C2 catheter which was advanced through the gastric antrum to the level of the proximal duodenum.  Attempts were made to traverse the obstructed portion of the horizontal segment of the duodenum however this ultimately proved unsuccessful. Next, the C2 catheter was utilized to select the surgical gastrojejunostomy. Contrast injection confirmed appropriate positioning Next, over a regular glidewire, the C2 catheter was exchanged for a 264French balloon retention gastrojejunostomy catheter with tip ultimately terminating within the proximal/mid small bowel though there is some redundant jejunal lumen within the gastric fundus. The retention balloon was insufflated with approximately 10 cc of saline and dilute contrast. The external disc was cinched. Contrast injection confirmed appropriate positioning and functionality of both the gastric and jejunal lumens. A dressing was applied. The patient tolerated the procedure well without immediate postprocedural complication. IMPRESSION:  Technically successful fluoroscopic guided replacement and repositioning 20 French balloon retention gastrojejunostomy catheter with weighted tip of the jejunal lumen terminating through the surgical gastrojejunostomy anastomosis within the proximal/mid small bowel though note is made of some redundant jejunal lumen coiled within the gastric fundus. Both lumens are ready for immediate usage. Electronically Signed   By: Sandi Mariscal M.D.   On: 05/05/2021 16:07   DG CHEST PORT 1 VIEW  Result Date: 05/05/2021 CLINICAL DATA:  Tachycardic this morning. Bilateral leg and right arm swelling. EXAM: PORTABLE CHEST 1 VIEW COMPARISON:  AP chest 04/11/2021 FINDINGS: Cardiac silhouette is again at the upper limits of normal size. Mediastinal contours are within normal limits with calcification seen within the aortic arch. New opacity within the right lung mid height mid to lateral transverse location. The left lung is clear. No definite pleural effusion. No pneumothorax. Cholecystectomy clips. Bilateral catheter tubing overlies the partially  visualized kidneys. Moderate multilevel degenerative disc changes. IMPRESSION: New right mid lung heterogeneous airspace opacity concerning for pneumonia. Electronically Signed   By: Yvonne Kendall M.D.   On: 05/05/2021 10:18   DG Abd Portable 1V  Result Date: 05/08/2021 CLINICAL DATA:  Feeding tube. EXAM: PORTABLE ABDOMEN - 1 VIEW COMPARISON:  05/05/2021 FINDINGS: Feeding tube is the scattered in some regions by contrast material in the bowel. The tip of the catheter cannot be confidently identified. Bowel gas pattern is nonspecific. IMPRESSION: Feeding tube tip cannot be confidently identified on this study due to contrast material within bowel loops. Electronically Signed   By: Misty Stanley M.D.   On: 05/08/2021 13:58   DG Abd Portable 1V  Result Date: 05/05/2021 CLINICAL DATA:  Severe abdominal pain, left-greater-than-right. Decreased urine output. Peg tube is to be adjusted later today. EXAM: PORTABLE ABDOMEN - 1 VIEW COMPARISON:  CT abdomen and pelvis and KUB 04/28/2021 FINDINGS: Percutaneous gastrojejunostomy tube is again noted. The distal tip is horizontal in orientation and may overlie the third portion of the duodenum, however this appears close to portions of the tubing extending along likely the greater curvature of the stomach not exclude that the tip could be also overlying the stomach. Nonobstructive bowel-gas pattern. Air is seen within the colon and rectum. No portal venous gas or pneumatosis. The lung bases are clear. Mild multilevel degenerative disc changes of the thoracic spine. IMPRESSION:: IMPRESSION: 1. Nonobstructed bowel-gas pattern. 2. Gastrojejunostomy tube is again noted. It is difficult to determine whether the distal tip is in the third portion of the duodenum versus the stomach. The tip was within the third portion of the duodenum on recent 04/28/2021 CT. Electronically Signed   By: Yvonne Kendall M.D.   On: 05/05/2021 10:25

## 2021-05-31 ENCOUNTER — Telehealth: Payer: Medicare Other | Admitting: Dietician

## 2021-06-01 ENCOUNTER — Telehealth: Payer: Medicare Other | Admitting: Dietician

## 2021-06-03 ENCOUNTER — Telehealth: Payer: Self-pay

## 2021-06-03 NOTE — Telephone Encounter (Signed)
06/03/21 @ 2081 [address] PontoDamaris Schooner with staff @ [facility] and gave them the order per Dr Hinton Rao. ? ?06/02/21 @ 1640 Dr Hinton Rao: okay ? ?06/02/21 : Alvis Lemmings social worker needs verbal order to postpone visit until next week per pt's spouse request. ?

## 2021-06-06 ENCOUNTER — Other Ambulatory Visit: Payer: Self-pay | Admitting: Hematology and Oncology

## 2021-06-06 DIAGNOSIS — K56609 Unspecified intestinal obstruction, unspecified as to partial versus complete obstruction: Secondary | ICD-10-CM

## 2021-06-06 DIAGNOSIS — C252 Malignant neoplasm of tail of pancreas: Secondary | ICD-10-CM

## 2021-06-06 DIAGNOSIS — C259 Malignant neoplasm of pancreas, unspecified: Secondary | ICD-10-CM | POA: Diagnosis not present

## 2021-06-08 DIAGNOSIS — C259 Malignant neoplasm of pancreas, unspecified: Secondary | ICD-10-CM | POA: Diagnosis not present

## 2021-06-08 DIAGNOSIS — E119 Type 2 diabetes mellitus without complications: Secondary | ICD-10-CM | POA: Diagnosis not present

## 2021-06-08 DIAGNOSIS — Z452 Encounter for adjustment and management of vascular access device: Secondary | ICD-10-CM | POA: Diagnosis not present

## 2021-06-08 DIAGNOSIS — I10 Essential (primary) hypertension: Secondary | ICD-10-CM | POA: Diagnosis not present

## 2021-06-08 DIAGNOSIS — Z7984 Long term (current) use of oral hypoglycemic drugs: Secondary | ICD-10-CM | POA: Diagnosis not present

## 2021-06-10 ENCOUNTER — Other Ambulatory Visit: Payer: Self-pay | Admitting: Oncology

## 2021-06-10 DIAGNOSIS — C252 Malignant neoplasm of tail of pancreas: Secondary | ICD-10-CM

## 2021-06-10 NOTE — Progress Notes (Signed)
START ON PATHWAY REGIMEN - Pancreatic Adenocarcinoma ? ? ?  A cycle is every 28 days: ?    Gemcitabine  ? ?**Always confirm dose/schedule in your pharmacy ordering system** ? ?Patient Characteristics: ?Locally Advanced, Anatomically Unresectable, First Line, PS ? 2, BRCA1/2 and PALB2 Mutation Absent/Unknown, Chemotherapy ?Therapeutic Status: Locally Advanced, Anatomically Unresectable ?Line of Therapy: First Line ?ECOG Performance Status: 2 ?BRCA1/2 Mutation Status: Awaiting Test Results ?PALB2 Mutation Status: Awaiting Test Results ?Intent of Therapy: ?Non-Curative / Palliative Intent, Discussed with Patient ?

## 2021-06-13 ENCOUNTER — Other Ambulatory Visit: Payer: Self-pay

## 2021-06-13 ENCOUNTER — Inpatient Hospital Stay: Payer: Medicare Other

## 2021-06-13 ENCOUNTER — Inpatient Hospital Stay (INDEPENDENT_AMBULATORY_CARE_PROVIDER_SITE_OTHER): Payer: Medicare Other | Admitting: Hematology and Oncology

## 2021-06-13 ENCOUNTER — Encounter: Payer: Self-pay | Admitting: Oncology

## 2021-06-13 ENCOUNTER — Encounter: Payer: Self-pay | Admitting: Hematology and Oncology

## 2021-06-13 ENCOUNTER — Other Ambulatory Visit: Payer: Self-pay | Admitting: Hematology and Oncology

## 2021-06-13 VITALS — BP 126/79 | HR 74 | Temp 98.0°F | Resp 20 | Ht <= 58 in | Wt 109.1 lb

## 2021-06-13 DIAGNOSIS — C252 Malignant neoplasm of tail of pancreas: Secondary | ICD-10-CM

## 2021-06-13 DIAGNOSIS — C259 Malignant neoplasm of pancreas, unspecified: Secondary | ICD-10-CM

## 2021-06-13 DIAGNOSIS — K56609 Unspecified intestinal obstruction, unspecified as to partial versus complete obstruction: Secondary | ICD-10-CM | POA: Diagnosis not present

## 2021-06-13 LAB — BASIC METABOLIC PANEL
BUN: 19 (ref 4–21)
CO2: 24 — AB (ref 13–22)
Chloride: 101 (ref 99–108)
Creatinine: 1 (ref 0.5–1.1)
Glucose: 150
Potassium: 3.9 mEq/L (ref 3.5–5.1)
Sodium: 132 — AB (ref 137–147)

## 2021-06-13 LAB — HEPATIC FUNCTION PANEL
ALT: 23 U/L (ref 7–35)
AST: 24 (ref 13–35)
Alkaline Phosphatase: 99 (ref 25–125)
Bilirubin, Total: 0.4

## 2021-06-13 LAB — CBC AND DIFFERENTIAL
HCT: 32 — AB (ref 36–46)
Hemoglobin: 10 — AB (ref 12.0–16.0)
Neutrophils Absolute: 4.9
Platelets: 258 10*3/uL (ref 150–400)
WBC: 5.9

## 2021-06-13 LAB — CBC: RBC: 3.62 — AB (ref 3.87–5.11)

## 2021-06-13 LAB — COMPREHENSIVE METABOLIC PANEL
Albumin: 3.5 (ref 3.5–5.0)
Calcium: 8.3 — AB (ref 8.7–10.7)

## 2021-06-13 MED ORDER — PROCHLORPERAZINE MALEATE 10 MG PO TABS: 10.0000 mg | ORAL_TABLET | Freq: Four times a day (QID) | ORAL | 1 refills | Status: DC | PRN

## 2021-06-13 MED ORDER — LIDOCAINE-PRILOCAINE 2.5-2.5 % EX CREA: TOPICAL_CREAM | CUTANEOUS | 3 refills | Status: DC

## 2021-06-13 MED ORDER — ONDANSETRON HCL 8 MG PO TABS: 8.0000 mg | ORAL_TABLET | Freq: Two times a day (BID) | ORAL | 1 refills | Status: DC | PRN

## 2021-06-13 NOTE — Progress Notes (Signed)
? ?CHEMO CARE CLINIC CONSULT NOTE ? ?Patient Care Team: ?Street, [name], MD as PCP - General (Family Medicine)  ? ?Name of the patient: Leah Pruitt  ?[id number]  ?11-24-1945  ? ?Date of visit: 06/13/21 ? ?Diagnosis- Pancreatic Cancer ? ?Chief complaint/Reason for visit- Initial Meeting for [facility], preparing for starting chemotherapy ? ? ?Heme/Onc history:  ?Oncology History  ?Pancreatic cancer [facility])  ?04/08/2021 Cancer Staging  ? Staging form: Exocrine Pancreas, AJCC 8th Edition ?- Clinical stage from 04/08/2021: Stage IB (cT2, cN0, cM0) - Signed by Derwood Kaplan, MD on 06/10/2021 ?Histopathologic type: Mucinous adenocarcinoma ?Stage prefix: Initial diagnosis ?Total positive nodes: 0 ?Histologic grade (G): GX ?Histologic grading system: 3 grade system ?Tumor size (mm): 35 ?Diagnostic confirmation: Positive histology PLUS positive immunophenotyping and/or positive genetic studies ?Specimen type: Endoscopy with Biopsy ?Staged by: Managing physician ?Stage used in treatment planning: Yes ?National guidelines used in treatment planning: Yes ?Type of national guideline used in treatment planning: NCCN ? ?  ?04/28/2021 Initial Diagnosis  ? Pancreatic cancer [facility]) ?  ?06/17/2021 -  Chemotherapy  ? Patient is on Treatment Plan : PANCREAS Gemcitabine D1,8,15 q28d x 4 Cycles  ?   ? ? ?Interval history-  Patient presents to chemo care clinic today for initial meeting in preparation for starting chemotherapy. I introduced the chemo care clinic and we discussed that the role of the clinic is to assist those who are at an increased risk of emergency room visits and/or complications during the course of chemotherapy treatment. We discussed that the increased risk takes into account factors such as age, performance status, and co-morbidities. We also discussed that for some, this might include barriers to care such as not having a primary care provider, lack of insurance/transportation, or not being able to  afford medications. We discussed that the goal of the program is to help prevent unplanned ER visits and help reduce complications during chemotherapy. We do this by discussing specific risk factors to each individual and identifying ways that we can help improve these risk factors and reduce barriers to care.  ? ?Allergies  ?Allergen Reactions  ? Iodinated Contrast Media Swelling  ?  Pt had swelling after getting IV contrast during an IVP years ago  ? Naratriptan Anaphylaxis  ?  Rash, difficult time breathing and swallowing ?Rash, difficult time breathing and swallowing ?Rash, difficult time breathing and swallowing ?Rash, difficult time breathing and swallowing  ? Penicillins Other (See Comments)  ?  Numbness of mouth and dry mouth ?Tolerated Cephalosporin Date: 04/19/21. ?   ? Iodine Swelling  ? Ciprofloxacin   ?  Other reaction(s): Other (See Comments) ?Tendon pain  ? Duragesic-100 [Fentanyl] Other (See Comments)  ?  Spouse states pt had trouble breathing and stomach began to swell (patch specific)  ? Penicillin G Rash  ? ? ?Past Medical History:  ?Diagnosis Date  ? Appendicitis, acute 11/19/2012  ? Arrhythmia   ? Arthritis   ? Back pain   ? Cardiac arrhythmia   ? Colles' fracture of right radius, init for clos fx 07/09/2017  ? Diabetes ([place])   ? Diverticulosis   ? FH: cholecystectomy   ? Gastroesophageal reflux disease   ? GERD (gastroesophageal reflux disease) 11/19/2012  ? Headache   ? HTN (hypertension)   ? Lipoma   ? Scalp  ? Migraines   ? Osteoporosis   ? Pain in right wrist 07/09/2017  ? Unspecified essential hypertension 11/19/2012  ? ? ?Past Surgical History:  ?Procedure Laterality Date  ?  CESAREAN SECTION    ? CHOLECYSTECTOMY    ? GASTROJEJUNOSTOMY N/A 04/08/2021  ? Procedure: OPEN GASTROJEJUNOSTOMY BYPASS;  Surgeon: Dwan Bolt, MD;  Location: [place];  Service: General;  Laterality: N/A;  ? IR [place] TUBE CHANGE  05/05/2021  ? LAPAROSCOPIC APPENDECTOMY N/A 11/16/2012  ? Procedure: APPENDECTOMY  LAPAROSCOPIC;  Surgeon: Imogene Burn. Georgette Dover, MD;  Location: [place];  Service: General;  Laterality: N/A;  ? LAPAROSCOPIC LYSIS OF ADHESIONS Bilateral 11/16/2012  ? Procedure: LAPAROSCOPIC LYSIS OF ADHESIONS;  Surgeon: Imogene Burn. Georgette Dover, MD;  Location: [place];  Service: General;  Laterality: Bilateral;  ? TONSILLECTOMY    ? ? ?Social History  ? ?Socioeconomic History  ? Marital status: Married  ?  Spouse name: Not on file  ? Number of children: 2  ? Years of education: college  ? Highest education level: Not on file  ?Occupational History  ? Occupation: housewife  ?Tobacco Use  ? Smoking status: Never  ? Smokeless tobacco: Never  ?Vaping Use  ? Vaping Use: Never used  ?Substance and Sexual Activity  ? Alcohol use: No  ? Drug use: No  ? Sexual activity: Not on file  ?Other Topics Concern  ? Not on file  ?Social History Narrative  ? Lives at home with husband.  ? Right-handed.  ? Occasional use of caffeine.  ? ?Social Determinants of Health  ? ?Financial Resource Strain: Not on file  ?Food Insecurity: Not on file  ?Transportation Needs: Not on file  ?Physical Activity: Not on file  ?Stress: Not on file  ?Social Connections: Not on file  ?Intimate Partner Violence: Not on file  ? ? ?Family History  ?Problem Relation Age of Onset  ? Stroke Mother 50  ?     deceased  ? Hypertension Mother   ? Diabetes Mellitus I Mother   ? Lung cancer Father 28  ?     deceased  ? Hypertension Sister   ? Uterine cancer Sister   ? Stomach cancer Brother   ? ? ? ?Current Outpatient Medications:  ?  bisacodyl (DULCOLAX) 10 MG suppository, Place rectally., Disp: , Rfl:  ?  dicyclomine (BENTYL) 20 MG tablet, TAKE 1/2 TO 1 TABLET BY MOUTH UP TO FOUR TIMES DAILY AS NEEDED FOR ABDOMINAL CRAMPS, Disp: , Rfl:  ?  Docusate Sodium (DSS) 100 MG CAPS, Take 1 capsule by mouth daily., Disp: , Rfl:  ?  Hyoscyamine Sulfate SL 0.125 MG SUBL, DISSOLVE 1 TO 2 TABLETS UNDER THE TONGUE EVERY 6 TO 8 HOURS AS NEEDED FOR PAIN, Disp: , Rfl:  ?  polyethylene glycol powder  (GLYCOLAX/MIRALAX) 17 GM/SCOOP powder, Take by mouth., Disp: , Rfl:  ?  Acetaminophen 500 MG capsule, Take by mouth., Disp: , Rfl:  ?  Calcium Carbonate-Vitamin D 600-200 MG-UNIT TABS, Take 1 tablet by mouth 2 (two) times daily with a meal., Disp: , Rfl:  ?  CAMPHOR EX, Apply 1 application topically as needed (pain)., Disp: , Rfl:  ?  Carboxymethylcellulose Sodium (DRY EYE RELIEF OP), Apply 1 drop to eye as needed (Dryness)., Disp: , Rfl:  ?  Cholecalciferol 25 MCG (1000 UT) tablet, Take by mouth., Disp: , Rfl:  ?  diclofenac (VOLTAREN) 25 MG EC tablet, Take by mouth., Disp: , Rfl:  ?  diltiazem (TIAZAC) 180 MG 24 hr capsule, Take by mouth., Disp: , Rfl:  ?  furosemide (LASIX) 20 MG tablet, furosemide 20 mg tablet (Patient not taking: Reported on 06/13/2021), Disp: , Rfl:  ?  gabapentin (  NEURONTIN) 300 MG capsule, Neurontin 300 mg capsule (Patient not taking: Reported on 06/13/2021), Disp: , Rfl:  ?  HYDROcodone-acetaminophen (NORCO) 10-325 MG tablet, Take 1 tablet by mouth every 6 (six) hours as needed., Disp: , Rfl:  ?  hydrOXYzine (ATARAX) 10 MG tablet, , Disp: , Rfl:  ?  ibuprofen (ADVIL) 600 MG tablet, ibuprofen 600 mg tablet, Disp: , Rfl:  ?  magnesium hydroxide (MILK OF MAGNESIA) 800 MG/5ML suspension, Take 30 mLs by mouth daily., Disp: , Rfl:  ?  meclizine (ANTIVERT) 25 MG tablet, Take 25 mg by mouth daily as needed for dizziness., Disp: , Rfl:  ?  metFORMIN (GLUCOPHAGE) 500 MG tablet, Take by mouth., Disp: , Rfl:  ?  MOVANTIK 12.5 MG TABS tablet, Take 12.5 mg by mouth daily as needed (constipation)., Disp: , Rfl:  ?  nitroGLYCERIN (NITROSTAT) 0.4 MG SL tablet, Place 0.4 mg under the tongue every 5 (five) minutes as needed for chest pain., Disp: , Rfl:  ?  Nutritional Supplements (FEEDING SUPPLEMENT, OSMOLITE 1.5 CAL,) LIQD, 1,000 mLs by Per J Tube route daily. Okay for jevity instead of osmolite (Patient not taking: Reported on 05/30/2021), Disp: 7000 mL, Rfl: 2 ?  olmesartan (BENICAR) 20 MG tablet, Take 20 mg  by mouth 2 (two) times daily., Disp: , Rfl:  ?  ondansetron (ZOFRAN-ODT) 4 MG disintegrating tablet, Take 1 tablet (4 mg total) by mouth every 8 (eight) hours as needed for nausea., Disp: 20 tablet, Rfl: 0 ?  oxyCOD

## 2021-06-14 ENCOUNTER — Telehealth: Payer: Self-pay | Admitting: Hematology and Oncology

## 2021-06-14 LAB — CANCER ANTIGEN 19-9: CA 19-9: 83 U/mL — ABNORMAL HIGH (ref 0–35)

## 2021-06-14 NOTE — Telephone Encounter (Signed)
Unable to reach pt. VM was left. ?  ?Telephone ?06/14/2021 ?[place] at [facility] ?Melodye Ped, NP ?Hematology and Oncology Appointment ?Reason for call  ? ?Conversation: Appointment ?(Newest Message First) ?June 14, 2021 ?Me ?  ?DF ?  10:29 AM ?Note ?Contacted pt so that I could schedule her 1st Gemzar tx. . Unable to reach via phone, vm left. ?  ?** Per Manuela Schwartz, it is fine to schedule for tomorrow 3/22 since there is an opening. ?  ?Scheduling Message ?Entered by Leah Pruitt on 06/13/2021 at 10:46 AM ?Priority: High ?<No visit type provided>  ?Department: [facility]  ?Provider:   ?Appointment Notes:  ?Please schedule pt for 1st Gemcitabine on 3/24 around 1:30 or 2:00, day 8 on 3/31, day 15 on 4/6 and next cycle on  4/21.  ?LABS: 3/30 (morning), 4/5, 4/19  ?F/U: 4/5 and 4/19  ?Scheduling Notes:  ?  ?  ?  ? ?  ? ?DF ?  10:28 AM ?You attempted to contact Leah Pruitt, Leah Pruitt (Left Message)   ?Additional Documentation ? ?Encounter Info:  Billing Info, ?History, ?Allergies, ?Detailed Report ?  ? ?Orders Placed ? ?None ?Medication Renewals and Changes ? ? ?None ?  ?Medication List  ? ?Visit Diagnoses ? ? ?None ?  ?Problem List  ? ?

## 2021-06-14 NOTE — Telephone Encounter (Signed)
Contacted pt so that I could schedule her 1st Gemzar tx. . Unable to reach via phone, vm left. ? ?** Per Manuela Schwartz, it is fine to schedule for tomorrow 3/22 since there is an opening. ? ?Scheduling Message ?Entered by Juanetta Beets on 06/13/2021 at 10:46 AM ?Priority: High ?<No visit type provided>  ?Department: [facility]  ?Provider:   ?Appointment Notes:  ?Please schedule pt for 1st Gemcitabine on 3/24 around 1:30 or 2:00, day 8 on 3/31, day 15 on 4/6 and next cycle on  4/21.  ?LABS: 3/30 (morning), 4/5, 4/19  ?F/U: 4/5 and 4/19  ?Scheduling Notes:  ? ?

## 2021-06-16 ENCOUNTER — Other Ambulatory Visit: Payer: Self-pay | Admitting: Hematology and Oncology

## 2021-06-16 DIAGNOSIS — K56609 Unspecified intestinal obstruction, unspecified as to partial versus complete obstruction: Secondary | ICD-10-CM

## 2021-06-16 DIAGNOSIS — C252 Malignant neoplasm of tail of pancreas: Secondary | ICD-10-CM

## 2021-06-16 MED ORDER — ONDANSETRON HCL 4 MG PO TABS: 4.0000 mg | ORAL_TABLET | ORAL | 3 refills | Status: AC | PRN

## 2021-06-16 MED ORDER — PROCHLORPERAZINE MALEATE 10 MG PO TABS: 10.0000 mg | ORAL_TABLET | Freq: Four times a day (QID) | ORAL | 1 refills | Status: DC | PRN

## 2021-06-17 ENCOUNTER — Telehealth: Payer: Self-pay

## 2021-06-17 MED FILL — Gemcitabine HCl Inj 1 GM/26.3ML (38 MG/ML) (Base Equiv): INTRAVENOUS | Qty: 29 | Status: AC

## 2021-06-20 ENCOUNTER — Other Ambulatory Visit: Payer: Self-pay | Admitting: Oncology

## 2021-06-20 ENCOUNTER — Encounter: Payer: Self-pay | Admitting: Oncology

## 2021-06-20 ENCOUNTER — Ambulatory Visit: Payer: Medicare Other

## 2021-06-20 DIAGNOSIS — F32A Depression, unspecified: Secondary | ICD-10-CM

## 2021-06-20 MED ORDER — CITALOPRAM HYDROBROMIDE 10 MG PO TABS: 10.0000 mg | ORAL_TABLET | Freq: Every day | ORAL | 5 refills | Status: DC

## 2021-06-20 NOTE — Telephone Encounter (Signed)
Patients husband called request stronger pain meds and depression meds , voiced they had enough pain meds to last till Thursday.  ?

## 2021-06-23 ENCOUNTER — Inpatient Hospital Stay: Payer: Medicare Other

## 2021-06-23 DIAGNOSIS — Z434 Encounter for attention to other artificial openings of digestive tract: Secondary | ICD-10-CM | POA: Diagnosis not present

## 2021-06-23 DIAGNOSIS — C252 Malignant neoplasm of tail of pancreas: Secondary | ICD-10-CM

## 2021-06-23 DIAGNOSIS — Z431 Encounter for attention to gastrostomy: Secondary | ICD-10-CM | POA: Diagnosis not present

## 2021-06-23 DIAGNOSIS — C259 Malignant neoplasm of pancreas, unspecified: Secondary | ICD-10-CM | POA: Diagnosis not present

## 2021-06-23 LAB — BASIC METABOLIC PANEL
BUN: 17 (ref 4–21)
CO2: 28 — AB (ref 13–22)
Chloride: 101 (ref 99–108)
Creatinine: 1 (ref 0.5–1.1)
Glucose: 125
Potassium: 4.5 mEq/L (ref 3.5–5.1)
Sodium: 133 — AB (ref 137–147)

## 2021-06-23 LAB — COMPREHENSIVE METABOLIC PANEL
Albumin: 3.4 — AB (ref 3.5–5.0)
Calcium: 8.7 (ref 8.7–10.7)

## 2021-06-23 LAB — CBC: RBC: 3.53 — AB (ref 3.87–5.11)

## 2021-06-23 LAB — HEPATIC FUNCTION PANEL
ALT: 22 U/L (ref 7–35)
AST: 27 (ref 13–35)
Alkaline Phosphatase: 109 (ref 25–125)
Bilirubin, Total: 0.4

## 2021-06-23 LAB — CBC AND DIFFERENTIAL
HCT: 31 — AB (ref 36–46)
Hemoglobin: 10.1 — AB (ref 12.0–16.0)
Neutrophils Absolute: 4.35
Platelets: 295 10*3/uL (ref 150–400)
WBC: 5.5

## 2021-06-24 ENCOUNTER — Other Ambulatory Visit: Payer: Medicare Other

## 2021-06-27 ENCOUNTER — Ambulatory Visit: Payer: Medicare Other

## 2021-06-27 ENCOUNTER — Inpatient Hospital Stay: Payer: Medicare Other | Attending: Oncology

## 2021-06-27 ENCOUNTER — Encounter: Payer: Self-pay | Admitting: Oncology

## 2021-06-27 VITALS — BP 156/90 | HR 105 | Temp 98.2°F | Resp 20 | Ht <= 58 in | Wt 110.0 lb

## 2021-06-27 DIAGNOSIS — Z5111 Encounter for antineoplastic chemotherapy: Secondary | ICD-10-CM | POA: Insufficient documentation

## 2021-06-27 DIAGNOSIS — C252 Malignant neoplasm of tail of pancreas: Secondary | ICD-10-CM | POA: Diagnosis not present

## 2021-06-27 DIAGNOSIS — K56609 Unspecified intestinal obstruction, unspecified as to partial versus complete obstruction: Secondary | ICD-10-CM

## 2021-06-27 MED ORDER — HEPARIN SOD (PORK) LOCK FLUSH 100 UNIT/ML IV SOLN
500.0000 [IU] | Freq: Once | INTRAVENOUS | Status: AC | PRN
  Administered 2021-06-27: 500 [IU]

## 2021-06-27 MED ORDER — SODIUM CHLORIDE 0.9 % IV SOLN: Freq: Once | INTRAVENOUS | Status: AC

## 2021-06-27 MED ORDER — SODIUM CHLORIDE 0.9% FLUSH
10.0000 mL | INTRAVENOUS | Status: DC | PRN
  Administered 2021-06-27: 10 mL

## 2021-06-27 MED ORDER — SODIUM CHLORIDE 0.9 % IV SOLN
800.0000 mg/m2 | Freq: Once | INTRAVENOUS | Status: AC
  Administered 2021-06-27: 1102 mg via INTRAVENOUS
  Filled 2021-06-27: qty 26.3

## 2021-06-27 MED ORDER — ONDANSETRON HCL 4 MG/2ML IJ SOLN
8.0000 mg | Freq: Once | INTRAMUSCULAR | Status: AC
  Administered 2021-06-27: 8 mg via INTRAVENOUS
  Filled 2021-06-27: qty 4

## 2021-06-27 NOTE — Patient Instructions (Signed)
Gemcitabine injection ?What is this medication? ?GEMCITABINE (jem SYE ta been) is a chemotherapy drug. This medicine is used to treat many types of cancer like breast cancer, lung cancer, pancreatic cancer, and ovarian cancer. ?This medicine may be used for other purposes; ask your health care provider or pharmacist if you have questions. ?COMMON BRAND NAME(S): Gemzar, Infugem ?What should I tell my care team before I take this medication? ?They need to know if you have any of these conditions: ?blood disorders ?infection ?kidney disease ?liver disease ?lung or breathing disease, like asthma ?recent or ongoing radiation therapy ?an unusual or allergic reaction to gemcitabine, other chemotherapy, other medicines, foods, dyes, or preservatives ?pregnant or trying to get pregnant ?breast-feeding ?How should I use this medication? ?This drug is given as an infusion into a vein. It is administered in a hospital or clinic by a specially trained health care professional. ?Talk to your pediatrician regarding the use of this medicine in children. Special care may be needed. ?Overdosage: If you think you have taken too much of this medicine contact a poison control center or emergency room at once. ?NOTE: This medicine is only for you. Do not share this medicine with others. ?What if I miss a dose? ?It is important not to miss your dose. Call your doctor or health care professional if you are unable to keep an appointment. ?What may interact with this medication? ?medicines to increase blood counts like filgrastim, pegfilgrastim, sargramostim ?some other chemotherapy drugs like cisplatin ?vaccines ?Talk to your doctor or health care professional before taking any of these medicines: ?acetaminophen ?aspirin ?ibuprofen ?ketoprofen ?naproxen ?This list may not describe all possible interactions. Give your health care provider a list of all the medicines, herbs, non-prescription drugs, or dietary supplements you use. Also tell  them if you smoke, drink alcohol, or use illegal drugs. Some items may interact with your medicine. ?What should I watch for while using this medication? ?Visit your doctor for checks on your progress. This drug may make you feel generally unwell. This is not uncommon, as chemotherapy can affect healthy cells as well as cancer cells. Report any side effects. Continue your course of treatment even though you feel ill unless your doctor tells you to stop. ?In some cases, you may be given additional medicines to help with side effects. Follow all directions for their use. ?Call your doctor or health care professional for advice if you get a fever, chills or sore throat, or other symptoms of a cold or flu. Do not treat yourself. This drug decreases your body's ability to fight infections. Try to avoid being around people who are sick. ?This medicine may increase your risk to bruise or bleed. Call your doctor or health care professional if you notice any unusual bleeding. ?Be careful brushing and flossing your teeth or using a toothpick because you may get an infection or bleed more easily. If you have any dental work done, tell your dentist you are receiving this medicine. ?Avoid taking products that contain aspirin, acetaminophen, ibuprofen, naproxen, or ketoprofen unless instructed by your doctor. These medicines may hide a fever. ?Do not become pregnant while taking this medicine or for 6 months after stopping it. Women should inform their doctor if they wish to become pregnant or think they might be pregnant. Men should not father a child while taking this medicine and for 3 months after stopping it. There is a potential for serious side effects to an unborn child. Talk to your health care professional   or pharmacist for more information. Do not breast-feed an infant while taking this medicine or for at least 1 week after stopping it. ?Men should inform their doctors if they wish to father a child. This medicine may  lower sperm counts. Talk with your doctor or health care professional if you are concerned about your fertility. ?What side effects may I notice from receiving this medication? ?Side effects that you should report to your doctor or health care professional as soon as possible: ?allergic reactions like skin rash, itching or hives, swelling of the face, lips, or tongue ?breathing problems ?pain, redness, or irritation at site where injected ?signs and symptoms of a dangerous change in heartbeat or heart rhythm like chest pain; dizziness; fast or irregular heartbeat; palpitations; feeling faint or lightheaded, falls; breathing problems ?signs of decreased platelets or bleeding - bruising, pinpoint red spots on the skin, black, tarry stools, blood in the urine ?signs of decreased red blood cells - unusually weak or tired, feeling faint or lightheaded, falls ?signs of infection - fever or chills, cough, sore throat, pain or difficulty passing urine ?signs and symptoms of kidney injury like trouble passing urine or change in the amount of urine ?signs and symptoms of liver injury like dark yellow or brown urine; general ill feeling or flu-like symptoms; light-colored stools; loss of appetite; nausea; right upper belly pain; unusually weak or tired; yellowing of the eyes or skin ?swelling of ankles, feet, hands ?Side effects that usually do not require medical attention (report to your doctor or health care professional if they continue or are bothersome): ?constipation ?diarrhea ?hair loss ?loss of appetite ?nausea ?rash ?vomiting ?This list may not describe all possible side effects. Call your doctor for medical advice about side effects. You may report side effects to FDA at 1-800-FDA-1088. ?Where should I keep my medication? ?This drug is given in a hospital or clinic and will not be stored at home. ?NOTE: This sheet is a summary. It may not cover all possible information. If you have questions about this medicine,  talk to your doctor, pharmacist, or health care provider. ?? 2022 Elsevier/Gold Standard (2017-06-06 00:00:00) ? ?

## 2021-06-28 ENCOUNTER — Encounter: Payer: Self-pay | Admitting: Oncology

## 2021-06-29 ENCOUNTER — Telehealth: Payer: Self-pay

## 2021-06-29 NOTE — Telephone Encounter (Signed)
-----   Message from Melodye Ped, NP sent at 06/29/2021 12:32 PM EDT ----- ?Regarding: RE: muscle pain and high fever ?Will need ED evaluation for fever. ? ?----- Message ----- ?From: Georgette Shell, RN ?Sent: 06/29/2021  12:28 PM EDT ?To: Melodye Ped, NP ?Subject: muscle pain and high fever                    ? ?Leah Pruitt called to report patient is having muscle pain, articulation problems d/t pain and fever of 101 - 102. ? ? ?

## 2021-06-30 ENCOUNTER — Inpatient Hospital Stay (INDEPENDENT_AMBULATORY_CARE_PROVIDER_SITE_OTHER): Payer: Medicare Other | Admitting: Hematology and Oncology

## 2021-06-30 ENCOUNTER — Inpatient Hospital Stay: Payer: Medicare Other

## 2021-06-30 VITALS — BP 122/80 | HR 119 | Temp 99.5°F | Resp 20

## 2021-06-30 DIAGNOSIS — R509 Fever, unspecified: Secondary | ICD-10-CM | POA: Diagnosis not present

## 2021-06-30 DIAGNOSIS — N130 Hydronephrosis with ureteropelvic junction obstruction: Secondary | ICD-10-CM | POA: Diagnosis not present

## 2021-06-30 DIAGNOSIS — Z91041 Radiographic dye allergy status: Secondary | ICD-10-CM | POA: Diagnosis not present

## 2021-06-30 DIAGNOSIS — K6389 Other specified diseases of intestine: Secondary | ICD-10-CM | POA: Diagnosis not present

## 2021-06-30 DIAGNOSIS — F32A Depression, unspecified: Secondary | ICD-10-CM | POA: Diagnosis not present

## 2021-06-30 DIAGNOSIS — Z79899 Other long term (current) drug therapy: Secondary | ICD-10-CM | POA: Diagnosis not present

## 2021-06-30 DIAGNOSIS — N1330 Unspecified hydronephrosis: Secondary | ICD-10-CM | POA: Diagnosis not present

## 2021-06-30 DIAGNOSIS — N261 Atrophy of kidney (terminal): Secondary | ICD-10-CM | POA: Diagnosis not present

## 2021-06-30 DIAGNOSIS — Z7984 Long term (current) use of oral hypoglycemic drugs: Secondary | ICD-10-CM | POA: Diagnosis not present

## 2021-06-30 DIAGNOSIS — R1013 Epigastric pain: Secondary | ICD-10-CM | POA: Diagnosis not present

## 2021-06-30 DIAGNOSIS — C252 Malignant neoplasm of tail of pancreas: Secondary | ICD-10-CM

## 2021-06-30 DIAGNOSIS — Z880 Allergy status to penicillin: Secondary | ICD-10-CM | POA: Diagnosis not present

## 2021-06-30 DIAGNOSIS — R109 Unspecified abdominal pain: Secondary | ICD-10-CM | POA: Diagnosis not present

## 2021-06-30 DIAGNOSIS — D849 Immunodeficiency, unspecified: Secondary | ICD-10-CM | POA: Diagnosis not present

## 2021-06-30 DIAGNOSIS — K5730 Diverticulosis of large intestine without perforation or abscess without bleeding: Secondary | ICD-10-CM | POA: Diagnosis not present

## 2021-06-30 DIAGNOSIS — K315 Obstruction of duodenum: Secondary | ICD-10-CM | POA: Diagnosis not present

## 2021-06-30 DIAGNOSIS — K56609 Unspecified intestinal obstruction, unspecified as to partial versus complete obstruction: Secondary | ICD-10-CM

## 2021-06-30 DIAGNOSIS — K5900 Constipation, unspecified: Secondary | ICD-10-CM | POA: Diagnosis not present

## 2021-06-30 DIAGNOSIS — R079 Chest pain, unspecified: Secondary | ICD-10-CM | POA: Diagnosis not present

## 2021-06-30 DIAGNOSIS — E119 Type 2 diabetes mellitus without complications: Secondary | ICD-10-CM | POA: Diagnosis not present

## 2021-06-30 DIAGNOSIS — K529 Noninfective gastroenteritis and colitis, unspecified: Secondary | ICD-10-CM | POA: Diagnosis not present

## 2021-06-30 DIAGNOSIS — Z792 Long term (current) use of antibiotics: Secondary | ICD-10-CM | POA: Diagnosis not present

## 2021-06-30 DIAGNOSIS — I10 Essential (primary) hypertension: Secondary | ICD-10-CM | POA: Diagnosis not present

## 2021-06-30 DIAGNOSIS — K3189 Other diseases of stomach and duodenum: Secondary | ICD-10-CM | POA: Diagnosis not present

## 2021-06-30 DIAGNOSIS — C259 Malignant neoplasm of pancreas, unspecified: Secondary | ICD-10-CM | POA: Diagnosis not present

## 2021-06-30 DIAGNOSIS — R1084 Generalized abdominal pain: Secondary | ICD-10-CM | POA: Diagnosis not present

## 2021-06-30 DIAGNOSIS — N134 Hydroureter: Secondary | ICD-10-CM | POA: Diagnosis not present

## 2021-06-30 DIAGNOSIS — R110 Nausea: Secondary | ICD-10-CM | POA: Diagnosis not present

## 2021-06-30 DIAGNOSIS — E46 Unspecified protein-calorie malnutrition: Secondary | ICD-10-CM | POA: Diagnosis not present

## 2021-06-30 DIAGNOSIS — Z681 Body mass index (BMI) 19 or less, adult: Secondary | ICD-10-CM | POA: Diagnosis not present

## 2021-06-30 DIAGNOSIS — R531 Weakness: Secondary | ICD-10-CM | POA: Diagnosis not present

## 2021-06-30 DIAGNOSIS — Z5111 Encounter for antineoplastic chemotherapy: Secondary | ICD-10-CM | POA: Diagnosis not present

## 2021-06-30 DIAGNOSIS — E871 Hypo-osmolality and hyponatremia: Secondary | ICD-10-CM | POA: Diagnosis not present

## 2021-06-30 DIAGNOSIS — D649 Anemia, unspecified: Secondary | ICD-10-CM | POA: Diagnosis not present

## 2021-06-30 LAB — CBC: RBC: 3.3 — AB (ref 3.87–5.11)

## 2021-06-30 LAB — BASIC METABOLIC PANEL WITH GFR
BUN: 17 (ref 4–21)
CO2: 23 — AB (ref 13–22)
Chloride: 99 (ref 99–108)
Creatinine: 0.9 (ref 0.5–1.1)
Glucose: 155
Potassium: 4 meq/L (ref 3.5–5.1)
Sodium: 131 — AB (ref 137–147)

## 2021-06-30 LAB — CBC AND DIFFERENTIAL
HCT: 29 — AB (ref 36–46)
Hemoglobin: 9.2 — AB (ref 12.0–16.0)
Neutrophils Absolute: 13.1
Platelets: 261 K/uL (ref 150–400)
WBC: 13.5

## 2021-06-30 LAB — COMPREHENSIVE METABOLIC PANEL
Albumin: 2.8 — AB (ref 3.5–5.0)
Calcium: 8.2 — AB (ref 8.7–10.7)

## 2021-06-30 LAB — HEPATIC FUNCTION PANEL
ALT: 27 U/L (ref 7–35)
AST: 32 (ref 13–35)
Alkaline Phosphatase: 125 (ref 25–125)
Bilirubin, Total: 0.3

## 2021-06-30 MED ORDER — HEPARIN SOD (PORK) LOCK FLUSH 100 UNIT/ML IV SOLN
500.0000 [IU] | Freq: Once | INTRAVENOUS | Status: AC | PRN
  Administered 2021-06-30: 500 [IU]

## 2021-06-30 MED ORDER — SODIUM CHLORIDE 0.9% FLUSH
10.0000 mL | INTRAVENOUS | Status: AC | PRN
  Administered 2021-06-30: 10 mL

## 2021-06-30 NOTE — Progress Notes (Signed)
1525-pt transported via wc to emergency room #9.  Report to RN.  Pt assisted to stretcher.  Lab work and H&P given to RN  ?

## 2021-07-01 ENCOUNTER — Other Ambulatory Visit: Payer: Medicare Other

## 2021-07-01 ENCOUNTER — Other Ambulatory Visit: Payer: Self-pay | Admitting: Pharmacist

## 2021-07-01 ENCOUNTER — Other Ambulatory Visit: Payer: Self-pay | Admitting: Oncology

## 2021-07-01 ENCOUNTER — Ambulatory Visit: Payer: Medicare Other | Admitting: Hematology and Oncology

## 2021-07-01 ENCOUNTER — Encounter: Payer: Self-pay | Admitting: Oncology

## 2021-07-01 ENCOUNTER — Encounter: Payer: Self-pay | Admitting: Hematology and Oncology

## 2021-07-01 DIAGNOSIS — K529 Noninfective gastroenteritis and colitis, unspecified: Secondary | ICD-10-CM | POA: Diagnosis not present

## 2021-07-01 DIAGNOSIS — E871 Hypo-osmolality and hyponatremia: Secondary | ICD-10-CM | POA: Diagnosis not present

## 2021-07-01 DIAGNOSIS — C259 Malignant neoplasm of pancreas, unspecified: Secondary | ICD-10-CM | POA: Diagnosis not present

## 2021-07-01 NOTE — Progress Notes (Signed)
?Patient Care Team: ?Street, [name], MD as PCP - General (Family Medicine) ? ?Clinic Day:  07/01/2021 ? ?Referring physician: Street, [name], * ? ?ASSESSMENT & PLAN:  ? ?Assessment & Plan: ?Pancreatic cancer ([place]) ?Pancreatic adenocarcinoma, with a mass measuring 3.3 x 3.5 cm on MRI. She received her first dose of gemcitabine last week and presents today with fevers as high as 102.8, abdominal pain, back pain and overall feeling poor. White count today is 13. I have discussed with the patient and her spouse the importance of intervention with antibiotics and most likely an admission to the hospital. They are both agreeable. We will send to ED for evaluation.   ? ?The patient understands the plans discussed today and is in agreement with them.  She knows to contact our office if she develops concerns prior to her next appointment. ? ? ? ? ?Melodye Ped, NP  ?[address] ?[place] [address] ?Dept: [phone] ?Dept Fax: [phone]  ? ?No orders of the defined types were placed in this encounter. ?  ? ? ?CHIEF COMPLAINT:  ?CC: A 76 year old female with history of pancreatic cancer here for symptom management, mostly fever and pain ? ?Current Treatment:  Gemcitabine ? ?INTERVAL HISTORY:  ?Leah Pruitt is here today for repeat clinical assessment. She denies fevers or chills. She denies pain. Her appetite is good. Her weight has been stable. ? ?I have reviewed the past medical history, past surgical history, social history and family history with the patient and they are unchanged from previous note. ? ?ALLERGIES:  is allergic to iodinated contrast media, naratriptan, penicillins, iodine, ciprofloxacin, duragesic-100 [fentanyl], and penicillin g. ? ?MEDICATIONS:  ?Current Outpatient Medications  ?Medication Sig Dispense Refill  ? citalopram (CELEXA) 10 MG tablet Take 1 tablet (10 mg total) by mouth daily. 30 tablet 5  ?  Acetaminophen 500 MG capsule Take by mouth.    ? bisacodyl (DULCOLAX) 10 MG suppository Place rectally.    ? Calcium Carbonate-Vitamin D 600-200 MG-UNIT TABS Take 1 tablet by mouth 2 (two) times daily with a meal.    ? CAMPHOR EX Apply 1 application topically as needed (pain).    ? Carboxymethylcellulose Sodium (DRY EYE RELIEF OP) Apply 1 drop to eye as needed (Dryness).    ? Cholecalciferol 25 MCG (1000 UT) tablet Take by mouth.    ? diclofenac (VOLTAREN) 25 MG EC tablet Take by mouth.    ? dicyclomine (BENTYL) 20 MG tablet TAKE 1/2 TO 1 TABLET BY MOUTH UP TO FOUR TIMES DAILY AS NEEDED FOR ABDOMINAL CRAMPS    ? diltiazem (TIAZAC) 180 MG 24 hr capsule Take by mouth.    ? Docusate Sodium (DSS) 100 MG CAPS Take 1 capsule by mouth daily.    ? gabapentin (NEURONTIN) 300 MG capsule Neurontin 300 mg capsule (Patient not taking: Reported on 06/13/2021)    ? HYDROcodone-acetaminophen (NORCO) 10-325 MG tablet Take 1 tablet by mouth every 6 (six) hours as needed.    ? Hyoscyamine Sulfate SL 0.125 MG SUBL DISSOLVE 1 TO 2 TABLETS UNDER THE TONGUE EVERY 6 TO 8 HOURS AS NEEDED FOR PAIN    ? ibuprofen (ADVIL) 600 MG tablet ibuprofen 600 mg tablet    ? lidocaine-prilocaine (EMLA) cream Apply to affected area once 30 g 3  ? magnesium hydroxide (MILK OF MAGNESIA) 800 MG/5ML suspension Take 30 mLs by mouth daily.    ? meclizine (ANTIVERT) 25 MG tablet Take 25  mg by mouth daily as needed for dizziness.    ? metFORMIN (GLUCOPHAGE) 500 MG tablet Take by mouth.    ? MOVANTIK 12.5 MG TABS tablet Take 12.5 mg by mouth daily as needed (constipation).    ? nitroGLYCERIN (NITROSTAT) 0.4 MG SL tablet Place 0.4 mg under the tongue every 5 (five) minutes as needed for chest pain.    ? olmesartan (BENICAR) 20 MG tablet Take 20 mg by mouth 2 (two) times daily.    ? ondansetron (ZOFRAN) 4 MG tablet Take 1 tablet (4 mg total) by mouth every 4 (four) hours as needed for nausea. 90 tablet 3  ? ondansetron (ZOFRAN) 8 MG tablet Take 1 tablet (8 mg total)  by mouth 2 (two) times daily as needed (Nausea or vomiting). 30 tablet 1  ? ondansetron (ZOFRAN-ODT) 4 MG disintegrating tablet Take 1 tablet (4 mg total) by mouth every 8 (eight) hours as needed for nausea. 20 tablet 0  ? oxyCODONE (OXY IR/ROXICODONE) 5 MG immediate release tablet Take 5-10 mg by mouth every 8 (eight) hours as needed.    ? polyethylene glycol powder (GLYCOLAX/MIRALAX) 17 GM/SCOOP powder Take by mouth.    ? prochlorperazine (COMPAZINE) 10 MG tablet Take 1 tablet (10 mg total) by mouth every 6 (six) hours as needed (Nausea or vomiting). 30 tablet 1  ? PROTONIX 40 MG tablet Take 40 mg by mouth 2 (two) times daily. (Patient not taking: Reported on 05/30/2021)    ? RABEprazole (ACIPHEX) 20 MG tablet Take 20 mg by mouth 2 (two) times daily.    ? sucralfate (CARAFATE) 1 GM/10ML suspension Take 1 g by mouth 2 (two) times daily.    ? tiZANidine (ZANAFLEX) 4 MG capsule Take by mouth.    ? traMADol (ULTRAM) 50 MG tablet Take 50 mg by mouth every 4 (four) hours as needed.    ? ?No current facility-administered medications for this visit.  ? ?Facility-Administered Medications Ordered in Other Visits  ?Medication Dose Route Frequency Provider Last Rate Last Admin  ? sodium chloride flush (NS) 0.9 % injection 10 mL  10 mL Intracatheter PRN [place] Scrape A, NP   10 mL at 06/30/21 1340  ? ? ?HISTORY OF PRESENT ILLNESS:  ? ?Oncology History  ?Pancreatic cancer [facility])  ?04/08/2021 Cancer Staging  ? Staging form: Exocrine Pancreas, AJCC 8th Edition ?- Clinical stage from 04/08/2021: Stage IB (cT2, cN0, cM0) - Signed by Derwood Kaplan, MD on 06/10/2021 ?Histopathologic type: Mucinous adenocarcinoma ?Stage prefix: Initial diagnosis ?Total positive nodes: 0 ?Histologic grade (G): GX ?Histologic grading system: 3 grade system ?Tumor size (mm): 35 ?Diagnostic confirmation: Positive histology PLUS positive immunophenotyping and/or positive genetic studies ?Specimen type: Endoscopy with Biopsy ?Staged by: Managing  physician ?Stage used in treatment planning: Yes ?National guidelines used in treatment planning: Yes ?Type of national guideline used in treatment planning: NCCN ? ?  ?04/28/2021 Initial Diagnosis  ? Pancreatic cancer [facility]) ?  ?06/27/2021 -  Chemotherapy  ? Patient is on Treatment Plan : PANCREAS Gemcitabine D1,8,15 q28d x 4 Cycles  ?   ?  ? ? ?REVIEW OF SYSTEMS:  ? ?Constitutional: Denies fevers, chills or abnormal weight loss ?Eyes: Denies blurriness of vision ?Ears, nose, mouth, throat, and face: Denies mucositis or sore throat ?Respiratory: Denies cough, dyspnea or wheezes ?Cardiovascular: Denies palpitation, chest discomfort or lower extremity swelling ?Gastrointestinal:  Denies nausea, heartburn or change in bowel habits ?Skin: Denies abnormal skin rashes ?Lymphatics: Denies new lymphadenopathy or easy bruising ?Neurological:Denies numbness, tingling or new  weaknesses ?Behavioral/Psych: Mood is stable, no new changes  ?All other systems were reviewed with the patient and are negative. ? ? ?VITALS:  ?There were no vitals taken for this visit.  ?Wt Readings from Last 3 Encounters:  ?06/27/21 110 lb (49.9 kg)  ?06/13/21 109 lb 1.6 oz (49.5 kg)  ?05/30/21 107 lb 8 oz (48.8 kg)  ?  ?There is no height or weight on file to calculate BMI. ? ?Performance status (ECOG): 1 - Symptomatic but completely ambulatory ? ?PHYSICAL EXAM:  ? ?GENERAL:alert, no distress and comfortable ?SKIN: skin color, texture, turgor are normal, no rashes or significant lesions ?EYES: normal, Conjunctiva are pink and non-injected, sclera clear ?OROPHARYNX:no exudate, no erythema and lips, buccal mucosa, and tongue normal  ?NECK: supple, thyroid normal size, non-tender, without nodularity ?LYMPH:  no palpable lymphadenopathy in the cervical, axillary or inguinal ?LUNGS: clear to auscultation and percussion with normal breathing effort ?HEART: regular rate & rhythm and no murmurs and no lower extremity edema ?ABDOMEN:abdomen soft, non-tender and  normal bowel sounds ?Musculoskeletal:no cyanosis of digits and no clubbing  ?NEURO: alert & oriented x 3 with fluent speech, no focal motor/sensory deficits ? ?LABORATORY DATA:  ?I have reviewed the data as listed

## 2021-07-01 NOTE — Assessment & Plan Note (Signed)
Pancreatic adenocarcinoma, with a mass measuring 3.3 x 3.5 cm on MRI. She received her first dose of gemcitabine last week and presents today with fevers as high as 102.8, abdominal pain, back pain and overall feeling poor. White count today is 13. I have discussed with the patient and her spouse the importance of intervention with antibiotics and most likely an admission to the hospital. They are both agreeable. We will send to ED for evaluation.  ?

## 2021-07-02 DIAGNOSIS — K529 Noninfective gastroenteritis and colitis, unspecified: Secondary | ICD-10-CM | POA: Diagnosis not present

## 2021-07-02 DIAGNOSIS — C259 Malignant neoplasm of pancreas, unspecified: Secondary | ICD-10-CM | POA: Diagnosis not present

## 2021-07-02 DIAGNOSIS — E871 Hypo-osmolality and hyponatremia: Secondary | ICD-10-CM | POA: Diagnosis not present

## 2021-07-04 ENCOUNTER — Ambulatory Visit: Payer: Medicare Other

## 2021-07-04 DIAGNOSIS — N1330 Unspecified hydronephrosis: Secondary | ICD-10-CM | POA: Diagnosis not present

## 2021-07-04 DIAGNOSIS — K5730 Diverticulosis of large intestine without perforation or abscess without bleeding: Secondary | ICD-10-CM | POA: Diagnosis not present

## 2021-07-04 DIAGNOSIS — E876 Hypokalemia: Secondary | ICD-10-CM | POA: Diagnosis not present

## 2021-07-04 DIAGNOSIS — N200 Calculus of kidney: Secondary | ICD-10-CM | POA: Diagnosis not present

## 2021-07-04 DIAGNOSIS — R509 Fever, unspecified: Secondary | ICD-10-CM | POA: Diagnosis not present

## 2021-07-04 DIAGNOSIS — K5909 Other constipation: Secondary | ICD-10-CM | POA: Diagnosis not present

## 2021-07-04 DIAGNOSIS — I700 Atherosclerosis of aorta: Secondary | ICD-10-CM | POA: Diagnosis not present

## 2021-07-05 DIAGNOSIS — K529 Noninfective gastroenteritis and colitis, unspecified: Secondary | ICD-10-CM | POA: Diagnosis not present

## 2021-07-05 DIAGNOSIS — I700 Atherosclerosis of aorta: Secondary | ICD-10-CM | POA: Diagnosis not present

## 2021-07-05 DIAGNOSIS — Z79891 Long term (current) use of opiate analgesic: Secondary | ICD-10-CM | POA: Diagnosis not present

## 2021-07-05 DIAGNOSIS — R509 Fever, unspecified: Secondary | ICD-10-CM | POA: Diagnosis not present

## 2021-07-05 DIAGNOSIS — N1330 Unspecified hydronephrosis: Secondary | ICD-10-CM | POA: Diagnosis not present

## 2021-07-05 DIAGNOSIS — Z91041 Radiographic dye allergy status: Secondary | ICD-10-CM | POA: Diagnosis not present

## 2021-07-05 DIAGNOSIS — E119 Type 2 diabetes mellitus without complications: Secondary | ICD-10-CM | POA: Diagnosis not present

## 2021-07-05 DIAGNOSIS — Z7984 Long term (current) use of oral hypoglycemic drugs: Secondary | ICD-10-CM | POA: Diagnosis not present

## 2021-07-05 DIAGNOSIS — Z792 Long term (current) use of antibiotics: Secondary | ICD-10-CM | POA: Diagnosis not present

## 2021-07-05 DIAGNOSIS — E876 Hypokalemia: Secondary | ICD-10-CM | POA: Diagnosis not present

## 2021-07-05 DIAGNOSIS — Z880 Allergy status to penicillin: Secondary | ICD-10-CM | POA: Diagnosis not present

## 2021-07-05 DIAGNOSIS — Z79899 Other long term (current) drug therapy: Secondary | ICD-10-CM | POA: Diagnosis not present

## 2021-07-05 DIAGNOSIS — Z931 Gastrostomy status: Secondary | ICD-10-CM | POA: Diagnosis not present

## 2021-07-05 DIAGNOSIS — K5909 Other constipation: Secondary | ICD-10-CM | POA: Diagnosis not present

## 2021-07-05 DIAGNOSIS — K5790 Diverticulosis of intestine, part unspecified, without perforation or abscess without bleeding: Secondary | ICD-10-CM | POA: Diagnosis not present

## 2021-07-05 DIAGNOSIS — C259 Malignant neoplasm of pancreas, unspecified: Secondary | ICD-10-CM | POA: Diagnosis not present

## 2021-07-05 DIAGNOSIS — N200 Calculus of kidney: Secondary | ICD-10-CM | POA: Diagnosis not present

## 2021-07-05 DIAGNOSIS — K5900 Constipation, unspecified: Secondary | ICD-10-CM | POA: Diagnosis not present

## 2021-07-05 DIAGNOSIS — F32A Depression, unspecified: Secondary | ICD-10-CM | POA: Diagnosis not present

## 2021-07-05 DIAGNOSIS — Z794 Long term (current) use of insulin: Secondary | ICD-10-CM | POA: Diagnosis not present

## 2021-07-05 DIAGNOSIS — R109 Unspecified abdominal pain: Secondary | ICD-10-CM | POA: Diagnosis not present

## 2021-07-05 DIAGNOSIS — I10 Essential (primary) hypertension: Secondary | ICD-10-CM | POA: Diagnosis not present

## 2021-07-05 DIAGNOSIS — D649 Anemia, unspecified: Secondary | ICD-10-CM | POA: Diagnosis not present

## 2021-07-05 DIAGNOSIS — K5730 Diverticulosis of large intestine without perforation or abscess without bleeding: Secondary | ICD-10-CM | POA: Diagnosis not present

## 2021-07-07 ENCOUNTER — Ambulatory Visit: Payer: Medicare Other | Admitting: Hematology and Oncology

## 2021-07-07 ENCOUNTER — Other Ambulatory Visit: Payer: Medicare Other

## 2021-07-08 ENCOUNTER — Ambulatory Visit: Payer: Medicare Other | Admitting: Oncology

## 2021-07-08 ENCOUNTER — Other Ambulatory Visit: Payer: Medicare Other

## 2021-07-08 ENCOUNTER — Telehealth: Payer: Self-pay | Admitting: Hematology and Oncology

## 2021-07-08 NOTE — Telephone Encounter (Signed)
Contacted pt to schedule an appt. Unable to reach via phone, voicemail was left. ? ? ?Message ?Received: Yesterday ?Melodye Ped, NP  [facility] Scheduling ?Patient was discharged today. Will need follow up next week. You can put her on my schedule.  ? ?Thank you,  ?Melissa  ?

## 2021-07-11 ENCOUNTER — Ambulatory Visit: Payer: Medicare Other

## 2021-07-13 ENCOUNTER — Other Ambulatory Visit: Payer: Self-pay | Admitting: Pharmacist

## 2021-07-13 ENCOUNTER — Inpatient Hospital Stay (INDEPENDENT_AMBULATORY_CARE_PROVIDER_SITE_OTHER): Payer: Medicare Other | Admitting: Hematology and Oncology

## 2021-07-13 ENCOUNTER — Inpatient Hospital Stay: Payer: Medicare Other

## 2021-07-13 DIAGNOSIS — K56609 Unspecified intestinal obstruction, unspecified as to partial versus complete obstruction: Secondary | ICD-10-CM

## 2021-07-13 DIAGNOSIS — D649 Anemia, unspecified: Secondary | ICD-10-CM | POA: Diagnosis not present

## 2021-07-13 DIAGNOSIS — C252 Malignant neoplasm of tail of pancreas: Secondary | ICD-10-CM | POA: Diagnosis not present

## 2021-07-13 LAB — HEPATIC FUNCTION PANEL
ALT: 19 U/L (ref 7–35)
AST: 25 (ref 13–35)
Alkaline Phosphatase: 113 (ref 25–125)
Bilirubin, Total: 0.3

## 2021-07-13 LAB — BASIC METABOLIC PANEL
BUN: 13 (ref 4–21)
CO2: 24 — AB (ref 13–22)
Chloride: 104 (ref 99–108)
Creatinine: 1 (ref 0.5–1.1)
Glucose: 160
Potassium: 3.8 mEq/L (ref 3.5–5.1)
Sodium: 136 — AB (ref 137–147)

## 2021-07-13 LAB — COMPREHENSIVE METABOLIC PANEL
Albumin: 3.4 — AB (ref 3.5–5.0)
Calcium: 8.8 (ref 8.7–10.7)

## 2021-07-13 LAB — CBC AND DIFFERENTIAL
HCT: 30 — AB (ref 36–46)
Hemoglobin: 9.4 — AB (ref 12.0–16.0)
Neutrophils Absolute: 4.19
Platelets: 475 10*3/uL — AB (ref 150–400)
WBC: 5.3

## 2021-07-13 LAB — CBC: RBC: 3.36 — AB (ref 3.87–5.11)

## 2021-07-14 ENCOUNTER — Encounter: Payer: Self-pay | Admitting: Oncology

## 2021-07-14 ENCOUNTER — Encounter: Payer: Self-pay | Admitting: Hematology and Oncology

## 2021-07-14 NOTE — Assessment & Plan Note (Signed)
Pancreatic adenocarcinoma, with a mass measuring 3.3 x 3.5 cm on MRI. She received her first dose of gemcitabine and was hospitalized a few days later with fever, poor nutrition and severe constipation. She was discharged after a couple of days and readmitted, again for severe constipation. Due to these hospitalizations, she missed day 8 of cycle 1. Today, she presents to clinic feeling much better. She is eating more and having regular bowel movements. She is anxious to get started back on treatment. We will resume treatment next week with cycle 1, Day 15 and moving forward, will eliminate Day 8. Her husband has been caring for her feeding tube and feels more comfortable using it after being instructed in the hospital. She will return to clinic in 2 weeks for repeat evaluation.  ?

## 2021-07-14 NOTE — Progress Notes (Signed)
?Patient Care Team: ?Street, [name], MD as PCP - General (Family Medicine) ?Daryel November, MD as Consulting Physician (Gastroenterology) ?Melodye Ped, NP as Nurse Practitioner (Hematology and Oncology) ?Dwan Bolt, MD as Consulting Physician (General Surgery) ?Derwood Kaplan, MD as Consulting Physician (Oncology) ? ?Clinic Day:  07/14/2021 ? ?Referring physician: Street, [name], * ? ?ASSESSMENT & PLAN:  ? ?Assessment & Plan: ?Pancreatic cancer ([place]) ?Pancreatic adenocarcinoma, with a mass measuring 3.3 x 3.5 cm on MRI. She received her first dose of gemcitabine and was hospitalized a few days later with fever, poor nutrition and severe constipation. She was discharged after a couple of days and readmitted, again for severe constipation. Due to these hospitalizations, she missed day 8 of cycle 1. Today, she presents to clinic feeling much better. She is eating more and having regular bowel movements. She is anxious to get started back on treatment. We will resume treatment next week with cycle 1, Day 15 and moving forward, will eliminate Day 8. Her husband has been caring for her feeding tube and feels more comfortable using it after being instructed in the hospital. She will return to clinic in 2 weeks for repeat evaluation.  ?  ? ?The patient understands the plans discussed today and is in agreement with them.  She knows to contact our office if she develops concerns prior to her next appointment. ? ? ? ?Melodye Ped, NP  ?[address] ?[place] [address] ?Dept: [phone] ?Dept Fax: [phone]  ? ?Orders Placed This Encounter  ?Procedures  ? CBC and differential  ?  This external order was created through the Results Console.  ? CBC  ?  This external order was created through the Results Console.  ? Basic metabolic panel  ?  This external order was created through the Results Console.  ?  Comprehensive metabolic panel  ?  This external order was created through the Results Console.  ? Hepatic function panel  ?  This external order was created through the Results Console.  ?  ? ? ?CHIEF COMPLAINT:  ?CC: A 76 year old female with history of pancreatic cancer here for hospital follow up ? ?Current Treatment:  Gemcitabine Day 1, Day 15 ? ?INTERVAL HISTORY:  ?Leah Pruitt is here today for repeat clinical assessment. She denies fevers or chills. She denies pain. Her appetite is good. Her weight has been stable. ? ?I have reviewed the past medical history, past surgical history, social history and family history with the patient and they are unchanged from previous note. ? ?ALLERGIES:  is allergic to iodinated contrast media, naratriptan, penicillins, iodine, ciprofloxacin, duragesic-100 [fentanyl], and penicillin g. ? ?MEDICATIONS:  ?Current Outpatient Medications  ?Medication Sig Dispense Refill  ? citalopram (CELEXA) 10 MG tablet Take 1 tablet (10 mg total) by mouth daily. 30 tablet 5  ? Acetaminophen 500 MG capsule Take by mouth.    ? bisacodyl (DULCOLAX) 10 MG suppository Place rectally.    ? Calcium Carbonate-Vitamin D 600-200 MG-UNIT TABS Take 1 tablet by mouth 2 (two) times daily with a meal.    ? CAMPHOR EX Apply 1 application topically as needed (pain).    ? Carboxymethylcellulose Sodium (DRY EYE RELIEF OP) Apply 1 drop to eye as needed (Dryness).    ? Cholecalciferol 25 MCG (1000 UT) tablet Take by mouth.    ? diclofenac (VOLTAREN) 25 MG EC tablet Take by mouth.    ? dicyclomine (  BENTYL) 20 MG tablet TAKE 1/2 TO 1 TABLET BY MOUTH UP TO FOUR TIMES DAILY AS NEEDED FOR ABDOMINAL CRAMPS    ? diltiazem (TIAZAC) 180 MG 24 hr capsule Take by mouth.    ? Docusate Sodium (DSS) 100 MG CAPS Take 1 capsule by mouth daily.    ? gabapentin (NEURONTIN) 300 MG capsule Neurontin 300 mg capsule (Patient not taking: Reported on 06/13/2021)    ? HYDROcodone-acetaminophen (NORCO) 10-325 MG tablet Take 1 tablet by mouth every  6 (six) hours as needed.    ? Hyoscyamine Sulfate SL 0.125 MG SUBL DISSOLVE 1 TO 2 TABLETS UNDER THE TONGUE EVERY 6 TO 8 HOURS AS NEEDED FOR PAIN    ? ibuprofen (ADVIL) 600 MG tablet ibuprofen 600 mg tablet    ? lidocaine-prilocaine (EMLA) cream Apply to affected area once 30 g 3  ? magnesium hydroxide (MILK OF MAGNESIA) 800 MG/5ML suspension Take 30 mLs by mouth daily.    ? meclizine (ANTIVERT) 25 MG tablet Take 25 mg by mouth daily as needed for dizziness.    ? metFORMIN (GLUCOPHAGE) 500 MG tablet Take by mouth.    ? MOVANTIK 12.5 MG TABS tablet Take 12.5 mg by mouth daily as needed (constipation).    ? nitroGLYCERIN (NITROSTAT) 0.4 MG SL tablet Place 0.4 mg under the tongue every 5 (five) minutes as needed for chest pain.    ? olmesartan (BENICAR) 20 MG tablet Take 20 mg by mouth 2 (two) times daily.    ? ondansetron (ZOFRAN) 4 MG tablet Take 1 tablet (4 mg total) by mouth every 4 (four) hours as needed for nausea. 90 tablet 3  ? ondansetron (ZOFRAN) 8 MG tablet Take 1 tablet (8 mg total) by mouth 2 (two) times daily as needed (Nausea or vomiting). 30 tablet 1  ? ondansetron (ZOFRAN-ODT) 4 MG disintegrating tablet Take 1 tablet (4 mg total) by mouth every 8 (eight) hours as needed for nausea. 20 tablet 0  ? oxyCODONE (OXY IR/ROXICODONE) 5 MG immediate release tablet Take 5-10 mg by mouth every 8 (eight) hours as needed.    ? polyethylene glycol powder (GLYCOLAX/MIRALAX) 17 GM/SCOOP powder Take by mouth.    ? prochlorperazine (COMPAZINE) 10 MG tablet Take 1 tablet (10 mg total) by mouth every 6 (six) hours as needed (Nausea or vomiting). 30 tablet 1  ? PROTONIX 40 MG tablet Take 40 mg by mouth 2 (two) times daily. (Patient not taking: Reported on 05/30/2021)    ? RABEprazole (ACIPHEX) 20 MG tablet Take 20 mg by mouth 2 (two) times daily.    ? sucralfate (CARAFATE) 1 GM/10ML suspension Take 1 g by mouth 2 (two) times daily.    ? tiZANidine (ZANAFLEX) 4 MG capsule Take by mouth.    ? traMADol (ULTRAM) 50 MG tablet  Take 50 mg by mouth every 4 (four) hours as needed.    ? ?No current facility-administered medications for this visit.  ? ?Facility-Administered Medications Ordered in Other Visits  ?Medication Dose Route Frequency Provider Last Rate Last Admin  ? sodium chloride flush (NS) 0.9 % injection 10 mL  10 mL Intracatheter PRN [place] Scrape A, NP   10 mL at 06/30/21 1340  ? ? ?HISTORY OF PRESENT ILLNESS:  ? ?Oncology History  ?Pancreatic cancer [facility])  ?04/08/2021 Cancer Staging  ? Staging form: Exocrine Pancreas, AJCC 8th Edition ?- Clinical stage from 04/08/2021: Stage IB (cT2, cN0, cM0) - Signed by Derwood Kaplan, MD on 06/10/2021 ?Histopathologic type: Mucinous adenocarcinoma ?Stage prefix: Initial  diagnosis ?Total positive nodes: 0 ?Histologic grade (G): GX ?Histologic grading system: 3 grade system ?Tumor size (mm): 35 ?Diagnostic confirmation: Positive histology PLUS positive immunophenotyping and/or positive genetic studies ?Specimen type: Endoscopy with Biopsy ?Staged by: Managing physician ?Stage used in treatment planning: Yes ?National guidelines used in treatment planning: Yes ?Type of national guideline used in treatment planning: NCCN ? ?  ?04/28/2021 Initial Diagnosis  ? Pancreatic cancer ([place]) ? ?  ?06/27/2021 -  Chemotherapy  ? Patient is on Treatment Plan : PANCREAS Gemcitabine D1 & D15 ONLY q28d x 4 Cycles (day 8 deleted 07/13/21)  ? ?  ?  ?  ? ? ?REVIEW OF SYSTEMS:  ? ?Constitutional: Denies fevers, chills or abnormal weight loss ?Eyes: Denies blurriness of vision ?Ears, nose, mouth, throat, and face: Denies mucositis or sore throat ?Respiratory: Denies cough, dyspnea or wheezes ?Cardiovascular: Denies palpitation, chest discomfort or lower extremity swelling ?Gastrointestinal:  Denies nausea, heartburn or change in bowel habits ?Skin: Denies abnormal skin rashes ?Lymphatics: Denies new lymphadenopathy or easy bruising ?Neurological:Denies numbness, tingling or new weaknesses ?Behavioral/Psych: Mood is  stable, no new changes  ?All other systems were reviewed with the patient and are negative. ? ? ?VITALS:  ?Blood pressure (!) 143/72, pulse (!) 101, temperature 98 ?F (36.7 ?C), temperature source [place]

## 2021-07-15 ENCOUNTER — Other Ambulatory Visit: Payer: Self-pay

## 2021-07-15 MED ORDER — LACTULOSE 10 GM/15ML PO SOLN: 20.0000 g | Freq: Two times a day (BID) | ORAL | 1 refills | Status: DC

## 2021-07-20 ENCOUNTER — Encounter: Payer: Self-pay | Admitting: Vascular Surgery

## 2021-07-20 ENCOUNTER — Inpatient Hospital Stay: Payer: Medicare Other

## 2021-07-20 VITALS — BP 137/76 | HR 74 | Temp 98.4°F | Resp 20 | Ht <= 58 in | Wt 106.1 lb

## 2021-07-20 DIAGNOSIS — C252 Malignant neoplasm of tail of pancreas: Secondary | ICD-10-CM | POA: Diagnosis not present

## 2021-07-20 DIAGNOSIS — K56609 Unspecified intestinal obstruction, unspecified as to partial versus complete obstruction: Secondary | ICD-10-CM

## 2021-07-20 DIAGNOSIS — Z5111 Encounter for antineoplastic chemotherapy: Secondary | ICD-10-CM | POA: Diagnosis not present

## 2021-07-20 MED ORDER — ONDANSETRON HCL 4 MG/2ML IJ SOLN
8.0000 mg | Freq: Once | INTRAMUSCULAR | Status: AC
  Administered 2021-07-20: 8 mg via INTRAVENOUS
  Filled 2021-07-20: qty 4

## 2021-07-20 MED ORDER — SODIUM CHLORIDE 0.9 % IV SOLN: Freq: Once | INTRAVENOUS | Status: DC

## 2021-07-20 MED ORDER — HEPARIN SOD (PORK) LOCK FLUSH 100 UNIT/ML IV SOLN
500.0000 [IU] | Freq: Once | INTRAVENOUS | Status: AC | PRN
  Administered 2021-07-20: 500 [IU]

## 2021-07-20 MED ORDER — SODIUM CHLORIDE 0.9% FLUSH
10.0000 mL | INTRAVENOUS | Status: DC | PRN
  Administered 2021-07-20: 10 mL

## 2021-07-20 MED ORDER — SODIUM CHLORIDE 0.9 % IV SOLN
800.0000 mg/m2 | Freq: Once | INTRAVENOUS | Status: AC
  Administered 2021-07-20: 1102 mg via INTRAVENOUS
  Filled 2021-07-20: qty 26.3

## 2021-07-20 MED ORDER — SODIUM CHLORIDE 0.9 % IV SOLN: Freq: Once | INTRAVENOUS | Status: AC

## 2021-07-20 NOTE — Patient Instructions (Signed)
Gemcitabine injection ??Qu? es [employer]? ?La GEMCITABINA es un agente quimioterap?utico. Este medicamento se [place] para tratar muchos tipos de c?ncer, tales como c?ncer de mama, c?ncer de pulm?n, c?ncer de p?ncreas y c?ncer de ovario. ?Este medicamento puede ser utilizado para otros usos; si tiene alguna pregunta consulte con su proveedor de atenci?n m?dica o con su farmac?utico. ?MARCAS COMUNES: Gemzar, Infugem ??Qu? le debo informar a mi profesional de la salud antes de tomar este medicamento? ?Necesitan saber si usted presenta alguno de los siguientes problemas o situaciones: ?trastornos sangu?neos ?infecci?n ?enfermedad renal ?enfermedad hep?tica ?enfermedad pulmonar o respiratoria, como asma ?terapia de radiaci?n en curso o reciente ?una reacci?n al?rgica o inusual a la gemcitabina, a otros medicamentos quimioterap?uticos, a otros medicamentos, alimentos, colorantes o conservantes ?si est? embarazada o buscando quedar embarazada ?si est? amamantando a un beb? ??C?mo debo utilizar este medicamento? ?Este medicamento se administra mediante infusi?n por v?a intravenosa. Lo administra un profesional de la salud calificado en un hospital o en un entorno cl?nico. ?Hable con su pediatra para informarse acerca del uso de este medicamento en ni?os. Puede requerir atenci?n especial. ?Sobredosis: P?ngase en contacto inmediatamente con un centro toxicol?gico o una sala de urgencia si usted cree que haya tomado demasiado medicamento. ?ATENCI?N: [employer] es solo para usted. No comparta este medicamento con nadie. ??Qu? sucede si me olvido de una dosis? ?Es importante no olvidar ninguna dosis. Informe a su m?dico o a su profesional de la salud si no puede asistir a una cita. ??Qu? puede interactuar con este medicamento? ?medicamentos para incrementar los conteos sangu?neos, tales como filgrastim, pegfilgrastim, sargramostim ?algunos otros agentes quimioterap?uticos como cisplatino ?vacunas ?Consulte a su m?dico  o a su profesional de la salud antes de tomar cualquiera de los siguientes medicamentos: ?acetaminofeno ?aspirina ?ibuprofeno ?quetoprofeno ?naproxeno ?Puede ser que esta lista no menciona todas las posibles interacciones. Informe a su profesional de la salud de [employer] productos a base de hierbas, medicamentos de venta libre o suplementos nutritivos que est? tomando. Si usted fuma, consume bebidas alcoh?licas o si utiliza drogas ilegales, ind?queselo tambi?n a su profesional de [employer]	[place]. Algunas sustancias pueden interactuar con su medicamento. ??A qu? debo estar atento al [employer]? ?Visite a su m?dico para que revise su evoluci?n. Este medicamento podr?a hacerle sentir un Nurse, mental health. Esto es normal, ya que la quimioterapia puede afectar tanto a las c?lulas sanas como a las c?lulas cancerosas. Si presenta alg?n efecto secundario, inf?rmelo. Contin?e con el tratamiento aun si se siente enfermo, a menos que su m?dico le indique que lo suspenda. ?En algunos casos, podr?a recibir [employer] adicionales para ayudarlo con los efectos secundarios. Siga todas las instrucciones para usarlos. ?Consulte a su m?dico o a su profesional de la salud si tiene [place], escalofr?os o dolor de garganta, o cualquier otro s?ntoma de resfr?o o gripe. No se trate usted mismo. Este medicamento reduce la capacidad del cuerpo para combatir infecciones. Trate de no acercarse a personas que est?n enfermas. ?Este medicamento podr?a aumentar el riesgo de moretones o sangrado. Consulte a su m?dico o a su profesional de la salud si observa sangrados inusuales. ?Proceda con cuidado al [employer], usar hilo dental o utilizar palillos para los dientes, ya que podr?a contraer una infecci?n o Therapist, art con mayor facilidad. Si se somete a alg?n tratamiento dental, informe a su dentista que est? [employer]. ?Evite usar productos que contienen aspirina, acetaminofeno, ibuprofeno, naproxeno o ketoprofeno, a menos  que as? lo indique su m?dico. Estos productos pueden  ocultar la fiebre. ?No debe quedar embarazada mientras est? usando este medicamento o por 6 meses despu?s de dejar de usarlo. Las mujeres deben informar a su m?dico si est?n buscando quedar embarazadas o si creen que podr?an estar embarazadas. Los hombres no deben tener hijos mientras est?n recibiendo [employer] y [place] 3 meses despu?s de dejar de usarlo. Existe la posibilidad de efectos secundarios graves en un beb? sin nacer. Para obtener m?s informaci?n, hable con su profesional de la salud o su farmac?utico. No debe amamantar a un beb? mientras est? tomando [employer] o durante al menos 1 semana despu?s de dejar de usarlo. ?Los hombres deben informar a su m?dico si quieren tener hijos. Este medicamento puede reducir el recuento de esperma. Hable con su m?dico o su profesional de la salud si est? preocupado por su fertilidad. ??Qu? efectos secundarios puedo tener al [employer] medicamento? ?Efectos secundarios que debe informar a su m?dico o a su profesional de la salud tan pronto como sea posible: ?reacciones al?rgicas, como erupci?n cut?nea, comez?n/picaz?n o urticaria, e hinchaz?n de la cara, los labios o la lengua ?problemas respiratorios ?dolor, enrojecimiento o irritaci?n en el lugar de la inyecci?n ?signos y s?ntomas de un cambio peligroso en el pulso o ritmo cardiaco, tales como dolor en el pecho; mareos; ritmo cardiaco r?pido o irregular; palpitaciones; sensaci?n de desmayo o aturdimiento, ca?das; problemas respiratorios ?signos de disminuci?n en la cantidad de plaquetas o sangrado: moretones, puntos rojos en la piel, heces de color negro y aspecto alquitranado, sangre en la orina ?signos de disminuci?n en la cantidad de gl?bulos rojos: cansancio o debilidad inusual, sensaci?n de desmayo o aturdimiento, ca?das ?signos de infecci?n: fiebre o escalofr?os, tos, dolor de garganta, dolor o dificultad para orinar ?signos y s?ntomas de lesi?n  al ri??n, tales como dificultad para orinar o cambios en la cantidad de orina ?signos y s?ntomas de lesi?n al h?gado, como orina amarilla oscura o marr?n; sensaci?n general de estar enfermo o s?ntomas gripales; heces claras; p?rdida del apetito; n?useas; dolor en la regi?n abdominal superior derecha; cansancio o debilidad inusual; color amarillento de los ojos o la piel ?hinchaz?n de tobillos, pies, manos ?Efectos secundarios que generalmente no requieren atenci?n m?dica (inf?rmelos a su m?dico o a su profesional de la salud si persisten o si son molestos): ?estre?imiento ?diarrea ?ca?da del cabello ?p?rdida del apetito ?n?useas ?erupci?n cut?nea ?v?mito ?Puede ser que esta lista no menciona todos los posibles efectos secundarios. Comun?quese a su m?dico por asesoramiento m?dico [employer] secundarios. Usted puede informar los efectos secundarios a la FDA por tel?fono al 1-800-FDA-1088. ??D?nde debo guardar mi medicina? ?Este medicamento se administra en hospitales o cl?nicas y no necesitar? guardarlo en su domicilio. ?ATENCI?N: Este folleto es un resumen. Puede ser que no cubra toda la posible informaci?n. Si usted tiene preguntas acerca de esta medicina, consulte con su m?dico, su farmac?utico o su profesional de Technical sales engineer. ?? 2023 Elsevier/Gold Standard (2017-07-24 00:00:00) ? ?

## 2021-07-22 ENCOUNTER — Other Ambulatory Visit: Payer: Self-pay | Admitting: Hematology and Oncology

## 2021-07-22 DIAGNOSIS — K56609 Unspecified intestinal obstruction, unspecified as to partial versus complete obstruction: Secondary | ICD-10-CM

## 2021-07-22 DIAGNOSIS — C252 Malignant neoplasm of tail of pancreas: Secondary | ICD-10-CM

## 2021-07-22 MED ORDER — LIDOCAINE-PRILOCAINE 2.5-2.5 % EX CREA: TOPICAL_CREAM | CUTANEOUS | 3 refills | Status: DC

## 2021-07-25 ENCOUNTER — Other Ambulatory Visit: Payer: Self-pay | Admitting: Hematology and Oncology

## 2021-07-25 MED ORDER — LACTULOSE 10 GM/15ML PO SOLN: 10.0000 g | Freq: Three times a day (TID) | ORAL | 0 refills | Status: AC

## 2021-07-26 ENCOUNTER — Telehealth: Payer: Self-pay

## 2021-07-26 NOTE — Telephone Encounter (Signed)
-----   Message from Melodye Ped, NP sent at 07/26/2021  3:09 PM EDT ----- ?Regarding: RE: home health msg ?Ok. That is a start. ?----- Message ----- ?From: Daryel November, LPN ?Sent: 07/26/2021   3:06 PM EDT ?To: Melodye Ped, NP ?Subject: home health msg                               ? ?[facility]  called  [facility]) patient did not have a skilled identified need to be admitted to home health services. They did call [facility]  for J tube supplies and will be sending orders for supplies . ? ? ?

## 2021-07-27 ENCOUNTER — Other Ambulatory Visit: Payer: Self-pay | Admitting: Hematology and Oncology

## 2021-07-27 ENCOUNTER — Inpatient Hospital Stay: Payer: Medicare Other

## 2021-07-27 ENCOUNTER — Inpatient Hospital Stay: Payer: Medicare Other | Attending: Oncology | Admitting: Hematology and Oncology

## 2021-07-27 ENCOUNTER — Encounter: Payer: Self-pay | Admitting: Hematology and Oncology

## 2021-07-27 DIAGNOSIS — C252 Malignant neoplasm of tail of pancreas: Secondary | ICD-10-CM | POA: Insufficient documentation

## 2021-07-27 DIAGNOSIS — Z5111 Encounter for antineoplastic chemotherapy: Secondary | ICD-10-CM | POA: Insufficient documentation

## 2021-07-27 DIAGNOSIS — D649 Anemia, unspecified: Secondary | ICD-10-CM | POA: Diagnosis not present

## 2021-07-27 DIAGNOSIS — K56609 Unspecified intestinal obstruction, unspecified as to partial versus complete obstruction: Secondary | ICD-10-CM

## 2021-07-27 LAB — HEPATIC FUNCTION PANEL
ALT: 27 U/L (ref 7–35)
AST: 34 (ref 13–35)
Alkaline Phosphatase: 181 — AB (ref 25–125)

## 2021-07-27 LAB — CORRECTED CALCIUM (CC13): Calcium, Corrected: 9.3

## 2021-07-27 LAB — BASIC METABOLIC PANEL
BUN: 10 (ref 4–21)
CO2: 22 (ref 13–22)
Chloride: 103 (ref 99–108)
Creatinine: 0.8 (ref 0.5–1.1)
Glucose: 125
Potassium: 3.4 mEq/L — AB (ref 3.5–5.1)
Sodium: 136 — AB (ref 137–147)

## 2021-07-27 LAB — PROTEIN, TOTAL: Total Protein: 6 g/dL — AB (ref 6.3–8.2)

## 2021-07-27 LAB — CBC AND DIFFERENTIAL
HCT: 28 — AB (ref 36–46)
Hemoglobin: 8.7 — AB (ref 12.0–16.0)
Neutrophils Absolute: 3.43
Platelets: 195 10*3/uL (ref 150–400)
WBC: 4.4

## 2021-07-27 LAB — COMPREHENSIVE METABOLIC PANEL
Albumin: 3.4 — AB (ref 3.5–5.0)
Calcium: 8.7 (ref 8.7–10.7)

## 2021-07-27 LAB — CBC: RBC: 3.1 — AB (ref 3.87–5.11)

## 2021-07-27 NOTE — Assessment & Plan Note (Signed)
Pancreatic adenocarcinoma, with a mass measuring 3.3 x 3.5 cm on MRI. She received her first dose of gemcitabine and was hospitalized a few days later with fever, poor nutrition and severe constipation. She was discharged after a couple of days and readmitted, again for severe constipation. Due to these hospitalizations, she missed day 8 of cycle 1. Today, she presents to clinic feeling much better. She is eating more and having regular bowel movements. She tolerated Day 15 well and continues to eat and drink more. She will proceed with cycle 2 Day 1 on 05/10. We plan to delete Day 8 again this cycle. She will return to clinic in 2 weeks.  ?

## 2021-07-27 NOTE — Progress Notes (Signed)
Patient Care Team: Street, [name], MD as PCP - General (Family Medicine) Daryel November, MD as Consulting Physician (Gastroenterology) Melodye Ped, NP as Nurse Practitioner (Hematology and Oncology) Dwan Bolt, MD as Consulting Physician (General Surgery) Derwood Kaplan, MD as Consulting Physician (Oncology)  Clinic Day:  07/27/2021  Referring physician: Street, [name], *  ASSESSMENT & PLAN:   Assessment & Plan: Pancreatic cancer [facility]) Pancreatic adenocarcinoma, with a mass measuring 3.3 x 3.5 cm on MRI. She received her first dose of gemcitabine and was hospitalized a few days later with fever, poor nutrition and severe constipation. She was discharged after a couple of days and readmitted, again for severe constipation. Due to these hospitalizations, she missed day 8 of cycle 1. Today, she presents to clinic feeling much better. She is eating more and having regular bowel movements. She tolerated Day 15 well and continues to eat and drink more. She will proceed with cycle 2 Day 1 on 05/10. We plan to delete Day 8 again this cycle. She will return to clinic in 2 weeks.     The patient understands the plans discussed today and is in agreement with them.  She knows to contact our office if she develops concerns prior to her next appointment.     Melodye Ped, NP  [place] [address] [place] [address] Dept: [phone] Dept Fax: [phone]   Orders Placed This Encounter  Procedures   CBC and differential    This external order was created through the Results Console.   CBC    This external order was created through the Results Console.      CHIEF COMPLAINT:  CC: A 76 year old female with history of pancreatic cancer here for weekly evaluation  Current Treatment:  Single agent Gemcitabine  INTERVAL HISTORY:  Leah Pruitt is here today for repeat clinical  assessment. She denies fevers or chills. She denies pain. Her appetite is good. Her weight has been stable.  I have reviewed the past medical history, past surgical history, social history and family history with the patient and they are unchanged from previous note.  ALLERGIES:  is allergic to iodinated contrast media, naratriptan, penicillins, iodine, ciprofloxacin, duragesic-100 [fentanyl], and penicillin g.  MEDICATIONS:  Current Outpatient Medications  Medication Sig Dispense Refill   ACCU-CHEK GUIDE test strip 2 (two) times daily.     Acetaminophen 500 MG capsule Take by mouth.     bisacodyl (DULCOLAX) 10 MG suppository Place rectally.     Calcium Carbonate-Vitamin D 600-200 MG-UNIT TABS Take 1 tablet by mouth 2 (two) times daily with a meal.     CAMPHOR EX Apply 1 application topically as needed (pain).     Carboxymethylcellulose Sodium (DRY EYE RELIEF OP) Apply 1 drop to eye as needed (Dryness).     Cholecalciferol 25 MCG (1000 UT) tablet Take by mouth.     diclofenac (VOLTAREN) 25 MG EC tablet Take by mouth.     dicyclomine (BENTYL) 20 MG tablet TAKE 1/2 TO 1 TABLET BY MOUTH UP TO FOUR TIMES DAILY AS NEEDED FOR ABDOMINAL CRAMPS     diltiazem (TIAZAC) 180 MG 24 hr capsule Take by mouth.     Docusate Sodium (DSS) 100 MG CAPS Take 1 capsule by mouth daily.     FEROSUL 325 (65 Fe) MG tablet Take 325 mg by mouth daily.     gabapentin (NEURONTIN) 300 MG capsule Neurontin 300 mg capsule (  Patient not taking: Reported on 06/13/2021)     GLUMETZA 500 MG 24 hr tablet Take by mouth.     HYDROcodone-acetaminophen (NORCO) 10-325 MG tablet Take 1 tablet by mouth every 6 (six) hours as needed.     Hyoscyamine Sulfate SL 0.125 MG SUBL DISSOLVE 1 TO 2 TABLETS UNDER THE TONGUE EVERY 6 TO 8 HOURS AS NEEDED FOR PAIN     ibuprofen (ADVIL) 600 MG tablet ibuprofen 600 mg tablet     lactulose (CONSTULOSE) 10 GM/15ML solution Take 15 mLs (10 g total) by mouth 3 (three) times daily. 237 mL 0    lidocaine-prilocaine (EMLA) cream Apply to affected area once 30 g 3   magnesium hydroxide (MILK OF MAGNESIA) 800 MG/5ML suspension Take 30 mLs by mouth daily.     meclizine (ANTIVERT) 25 MG tablet Take 25 mg by mouth daily as needed for dizziness.     metFORMIN (GLUCOPHAGE) 500 MG tablet Take by mouth.     metroNIDAZOLE (FLAGYL) 500 MG tablet Take 500 mg by mouth 3 (three) times daily.     MOVANTIK 12.5 MG TABS tablet Take 12.5 mg by mouth daily as needed (constipation).     nitroGLYCERIN (NITROSTAT) 0.4 MG SL tablet Place 0.4 mg under the tongue every 5 (five) minutes as needed for chest pain.     olmesartan (BENICAR) 20 MG tablet Take 20 mg by mouth 2 (two) times daily.     ondansetron (ZOFRAN) 4 MG tablet Take 1 tablet (4 mg total) by mouth every 4 (four) hours as needed for nausea. 90 tablet 3   ondansetron (ZOFRAN) 8 MG tablet Take 1 tablet (8 mg total) by mouth 2 (two) times daily as needed (Nausea or vomiting). 30 tablet 1   ondansetron (ZOFRAN-ODT) 4 MG disintegrating tablet Take 1 tablet (4 mg total) by mouth every 8 (eight) hours as needed for nausea. 20 tablet 0   oxyCODONE (OXY IR/ROXICODONE) 5 MG immediate release tablet Take 5-10 mg by mouth every 8 (eight) hours as needed.     prochlorperazine (COMPAZINE) 10 MG tablet Take 1 tablet (10 mg total) by mouth every 6 (six) hours as needed (Nausea or vomiting). 30 tablet 1   PROTONIX 40 MG tablet Take 40 mg by mouth 2 (two) times daily. (Patient not taking: Reported on 05/30/2021)     RABEprazole (ACIPHEX) 20 MG tablet Take 20 mg by mouth 2 (two) times daily.     STIMULANT LAXATIVE 8.6-50 MG tablet Take 2 tablets by mouth 2 (two) times daily.     sucralfate (CARAFATE) 1 GM/10ML suspension Take 1 g by mouth 2 (two) times daily.     tiZANidine (ZANAFLEX) 4 MG capsule Take by mouth.     traMADol (ULTRAM) 50 MG tablet Take 50 mg by mouth every 4 (four) hours as needed.     No current facility-administered medications for this visit.    Facility-Administered Medications Ordered in Other Visits  Medication Dose Route Frequency Provider Last Rate Last Admin   sodium chloride flush (NS) 0.9 % injection 10 mL  10 mL Intracatheter PRN [place] Scrape A, NP   10 mL at 06/30/21 1340    HISTORY OF PRESENT ILLNESS:   Oncology History  Pancreatic cancer ([place])  04/08/2021 Cancer Staging   Staging form: Exocrine Pancreas, AJCC 8th Edition - Clinical stage from 04/08/2021: Stage IB (cT2, cN0, cM0) - Signed by Derwood Kaplan, MD on 06/10/2021 Histopathologic type: Mucinous adenocarcinoma Stage prefix: Initial diagnosis Total positive nodes: 0 Histologic grade (  G): GX Histologic grading system: 3 grade system Tumor size (mm): 35 Diagnostic confirmation: Positive histology PLUS positive immunophenotyping and/or positive genetic studies Specimen type: Endoscopy with Biopsy Staged by: Managing physician Stage used in treatment planning: Yes National guidelines used in treatment planning: Yes Type of national guideline used in treatment planning: NCCN    04/28/2021 Initial Diagnosis   Pancreatic cancer ([place])    06/27/2021 -  Chemotherapy   Patient is on Treatment Plan : PANCREAS Gemcitabine D1 & D15 ONLY q28d x 4 Cycles (day 8 deleted 07/13/21)           REVIEW OF SYSTEMS:   Constitutional: Denies fevers, chills or abnormal weight loss Eyes: Denies blurriness of vision Ears, nose, mouth, throat, and face: Denies mucositis or sore throat Respiratory: Denies cough, dyspnea or wheezes Cardiovascular: Denies palpitation, chest discomfort or lower extremity swelling Gastrointestinal:  Denies nausea, heartburn or change in bowel habits Skin: Denies abnormal skin rashes Lymphatics: Denies new lymphadenopathy or easy bruising Neurological:Denies numbness, tingling or new weaknesses Behavioral/Psych: Mood is stable, no new changes  All other systems were reviewed with the patient and are negative.   VITALS:  Blood  pressure 130/79, pulse 100, temperature 97.6 F (36.4 C), temperature source Oral, resp. rate 20, height 4' 8.7" (1.44 m), weight 106 lb 6.4 oz (48.3 kg), SpO2 99 %.  Wt Readings from Last 3 Encounters:  07/27/21 106 lb 6.4 oz (48.3 kg)  07/20/21 106 lb 1.9 oz (48.1 kg)  07/13/21 101 lb 14.4 oz (46.2 kg)    Body mass index is 23.27 kg/m.  Performance status (ECOG): 1 - Symptomatic but completely ambulatory  PHYSICAL EXAM:   GENERAL:alert, no distress and comfortable SKIN: skin color, texture, turgor are normal, no rashes or significant lesions EYES: normal, Conjunctiva are pink and non-injected, sclera clear OROPHARYNX:no exudate, no erythema and lips, buccal mucosa, and tongue normal  NECK: supple, thyroid normal size, non-tender, without nodularity LYMPH:  no palpable lymphadenopathy in the cervical, axillary or inguinal LUNGS: clear to auscultation and percussion with normal breathing effort HEART: regular rate & rhythm and no murmurs and no lower extremity edema ABDOMEN:abdomen soft, non-tender and normal bowel sounds Musculoskeletal:no cyanosis of digits and no clubbing  NEURO: alert & oriented x 3 with fluent speech, no focal motor/sensory deficits  LABORATORY DATA:  I have reviewed the data as listed    Component Value Date/Time   NA 136 (A) 07/27/2021 0000   K 3.4 (A) 07/27/2021 0000   CL 103 07/27/2021 0000   CO2 22 07/27/2021 0000   GLUCOSE 127 (H) 05/06/2021 0649   BUN 10 07/27/2021 0000   CREATININE 0.8 07/27/2021 0000   CREATININE 0.88 05/06/2021 0649   CALCIUM 8.7 07/27/2021 0000   PROT 6 (A) 07/27/2021 0000   ALBUMIN 3.4 (A) 07/27/2021 0000   AST 34 07/27/2021 0000   ALT 27 07/27/2021 0000   ALKPHOS 181 (A) 07/27/2021 0000   BILITOT 1.2 05/05/2021 0951   GFRNONAA >60 05/06/2021 0649   GFRAA >60 10/11/2016 1100    No results found for: SPEP, UPEP  Lab Results  Component Value Date   WBC 4.4 07/27/2021   NEUTROABS 3.43 07/27/2021   HGB 8.7 (A)  07/27/2021   HCT 28 (A) 07/27/2021   MCV 86.7 05/06/2021   PLT 195 07/27/2021      Chemistry      Component Value Date/Time   NA 136 (A) 07/27/2021 0000   K 3.4 (A) 07/27/2021 0000   CL 103  07/27/2021 0000   CO2 22 07/27/2021 0000   BUN 10 07/27/2021 0000   CREATININE 0.8 07/27/2021 0000   CREATININE 0.88 05/06/2021 0649   GLU 125 07/27/2021 0000      Component Value Date/Time   CALCIUM 8.7 07/27/2021 0000   ALKPHOS 181 (A) 07/27/2021 0000   AST 34 07/27/2021 0000   ALT 27 07/27/2021 0000   BILITOT 1.2 05/05/2021 0951       RADIOGRAPHIC STUDIES: I have personally reviewed the radiological images as listed and agreed with the findings in the report. No results found.

## 2021-07-28 ENCOUNTER — Encounter: Payer: Self-pay | Admitting: Oncology

## 2021-07-29 ENCOUNTER — Other Ambulatory Visit: Payer: Self-pay

## 2021-07-29 ENCOUNTER — Other Ambulatory Visit: Payer: Self-pay | Admitting: Genetic Counselor

## 2021-07-29 ENCOUNTER — Encounter: Payer: Medicare Other | Admitting: Genetic Counselor

## 2021-07-29 ENCOUNTER — Telehealth: Payer: Self-pay

## 2021-07-29 ENCOUNTER — Other Ambulatory Visit: Payer: Self-pay | Admitting: Hematology and Oncology

## 2021-07-29 ENCOUNTER — Other Ambulatory Visit: Payer: Medicare Other

## 2021-07-29 DIAGNOSIS — C252 Malignant neoplasm of tail of pancreas: Secondary | ICD-10-CM

## 2021-07-29 DIAGNOSIS — G8929 Other chronic pain: Secondary | ICD-10-CM

## 2021-07-29 MED ORDER — OXYCODONE HCL 5 MG PO TABS: 5.0000 mg | ORAL_TABLET | ORAL | 0 refills | Status: DC | PRN

## 2021-07-29 NOTE — Telephone Encounter (Signed)
I spoke with pt's spouse, Johnsie Cancel. He is asking for stronger dose of the Emla cream. I told him that was the only dosage. He also said he needed refill of oxycodone. ?

## 2021-08-03 ENCOUNTER — Encounter: Payer: Self-pay | Admitting: Oncology

## 2021-08-03 ENCOUNTER — Inpatient Hospital Stay: Payer: Medicare Other

## 2021-08-03 VITALS — BP 133/84 | HR 111 | Resp 18 | Ht <= 58 in | Wt 104.2 lb

## 2021-08-03 DIAGNOSIS — C252 Malignant neoplasm of tail of pancreas: Secondary | ICD-10-CM | POA: Diagnosis not present

## 2021-08-03 DIAGNOSIS — Z5111 Encounter for antineoplastic chemotherapy: Secondary | ICD-10-CM | POA: Diagnosis not present

## 2021-08-03 DIAGNOSIS — K56609 Unspecified intestinal obstruction, unspecified as to partial versus complete obstruction: Secondary | ICD-10-CM

## 2021-08-03 MED ORDER — SODIUM CHLORIDE 0.9% FLUSH
10.0000 mL | INTRAVENOUS | Status: DC | PRN
  Administered 2021-08-03: 10 mL

## 2021-08-03 MED ORDER — HEPARIN SOD (PORK) LOCK FLUSH 100 UNIT/ML IV SOLN
500.0000 [IU] | Freq: Once | INTRAVENOUS | Status: AC | PRN
  Administered 2021-08-03: 500 [IU]

## 2021-08-03 MED ORDER — SODIUM CHLORIDE 0.9 % IV SOLN: Freq: Once | INTRAVENOUS | Status: AC

## 2021-08-03 MED ORDER — ONDANSETRON HCL 4 MG/2ML IJ SOLN
8.0000 mg | Freq: Once | INTRAMUSCULAR | Status: AC
  Administered 2021-08-03: 8 mg via INTRAVENOUS

## 2021-08-03 MED ORDER — SODIUM CHLORIDE 0.9 % IV SOLN
800.0000 mg/m2 | Freq: Once | INTRAVENOUS | Status: AC
  Administered 2021-08-03: 1102 mg via INTRAVENOUS
  Filled 2021-08-03: qty 26.3

## 2021-08-03 NOTE — Progress Notes (Signed)
1556:PT STABLE AT TIME OF DISCHARGE ?

## 2021-08-03 NOTE — Patient Instructions (Addendum)
[facility]  Discharge Instructions: ?Thank you for choosing [facility] to provide your oncology and hematology care.  ?If you have a lab appointment with the [facility], please go directly to the [facility] and check in at the registration area. ?  ?Wear comfortable clothing and clothing appropriate for easy access to any Portacath or PICC line.  ? ?We strive to give you quality time with your provider. You may need to reschedule your appointment if you arrive late (15 or more minutes).  Arriving late affects you and other patients whose appointments are after yours.  Also, if you miss three or more appointments without notifying the office, you may be dismissed from the clinic at the provider?s discretion.    ?  ?For prescription refill requests, have your pharmacy contact our office and allow 72 hours for refills to be completed.   ? ?Today you received the following chemotherapy and/or immunotherapy agents Gemcitabine   ?  ?To help prevent nausea and vomiting after your treatment, we encourage you to take your nausea medication as directed. ? ?BELOW ARE SYMPTOMS THAT SHOULD BE REPORTED IMMEDIATELY: ?*FEVER GREATER THAN 100.4 F (38 ?C) OR HIGHER ?*CHILLS OR SWEATING ?*NAUSEA AND VOMITING THAT IS NOT CONTROLLED WITH YOUR NAUSEA MEDICATION ?*UNUSUAL SHORTNESS OF BREATH ?*UNUSUAL BRUISING OR BLEEDING ?*URINARY PROBLEMS (pain or burning when urinating, or frequent urination) ?*BOWEL PROBLEMS (unusual diarrhea, constipation, pain near the anus) ?TENDERNESS IN MOUTH AND THROAT WITH OR WITHOUT PRESENCE OF ULCERS (sore throat, sores in mouth, or a toothache) ?UNUSUAL RASH, SWELLING OR PAIN  ?UNUSUAL VAGINAL DISCHARGE OR ITCHING  ? ?Items with * indicate a potential emergency and should be followed up as soon as possible or go to the Emergency Department if any problems should occur. ? ?Please show the CHEMOTHERAPY ALERT CARD or IMMUNOTHERAPY ALERT CARD at check-in to the  Emergency Department and triage nurse. ? ?Should you have questions after your visit or need to cancel or reschedule your appointment, please contact [facility]  Dept: [phone]  and follow the prompts.  Office hours are 8:00 a.m. to 4:30 p.m. Monday - Friday. Please note that voicemails left after 4:00 p.m. may not be returned until the following business day.  We are closed weekends and major holidays. You have access to a nurse at all times for urgent questions. Please call the main number to the clinic Dept: [phone] and follow the prompts. ? ?For any non-urgent questions, you may also contact your provider using MyChart. We now offer e-Visits for anyone 73 and older to request care online for non-urgent symptoms. For details visit [email or web address]. ?  ?Also download the MyChart app! Go to the app store, search "MyChart", open the app, select [facility], and log in with your MyChart username and password. ? ?Due to Covid, a mask is required upon entering the hospital/clinic. If you do not have a mask, one will be given to you upon arrival. For doctor visits, patients may have 1 support person aged 57 or older with them. For treatment visits, patients cannot have anyone with them due to current Covid guidelines and our immunocompromised population.  ? ?Gemcitabine injection ?What is this medication? ?GEMCITABINE (jem SYE ta been) is a chemotherapy drug. This medicine is used to treat many types of cancer like breast cancer, lung cancer, pancreatic cancer, and ovarian cancer. ?This medicine may be used for other purposes; ask your health care provider or pharmacist if you  have questions. ?COMMON BRAND NAME(S): Gemzar, Infugem ?What should I tell my care team before I take this medication? ?They need to know if you have any of these conditions: ?blood disorders ?infection ?kidney disease ?liver disease ?lung or breathing disease, like asthma ?recent or ongoing radiation  therapy ?an unusual or allergic reaction to gemcitabine, other chemotherapy, other medicines, foods, dyes, or preservatives ?pregnant or trying to get pregnant ?breast-feeding ?How should I use this medication? ?This drug is given as an infusion into a vein. It is administered in a hospital or clinic by a specially trained health care professional. ?Talk to your pediatrician regarding the use of this medicine in children. Special care may be needed. ?Overdosage: If you think you have taken too much of this medicine contact a poison control center or emergency room at once. ?NOTE: This medicine is only for you. Do not share this medicine with others. ?What if I miss a dose? ?It is important not to miss your dose. Call your doctor or health care professional if you are unable to keep an appointment. ?What may interact with this medication? ?medicines to increase blood counts like filgrastim, pegfilgrastim, sargramostim ?some other chemotherapy drugs like cisplatin ?vaccines ?Talk to your doctor or health care professional before taking any of these medicines: ?acetaminophen ?aspirin ?ibuprofen ?ketoprofen ?naproxen ?This list may not describe all possible interactions. Give your health care provider a list of all the medicines, herbs, non-prescription drugs, or dietary supplements you use. Also tell them if you smoke, drink alcohol, or use illegal drugs. Some items may interact with your medicine. ?What should I watch for while using this medication? ?Visit your doctor for checks on your progress. This drug may make you feel generally unwell. This is not uncommon, as chemotherapy can affect healthy cells as well as cancer cells. Report any side effects. Continue your course of treatment even though you feel ill unless your doctor tells you to stop. ?In some cases, you may be given additional medicines to help with side effects. Follow all directions for their use. ?Call your doctor or health care professional for  advice if you get a fever, chills or sore throat, or other symptoms of a cold or flu. Do not treat yourself. This drug decreases your body's ability to fight infections. Try to avoid being around people who are sick. ?This medicine may increase your risk to bruise or bleed. Call your doctor or health care professional if you notice any unusual bleeding. ?Be careful brushing and flossing your teeth or using a toothpick because you may get an infection or bleed more easily. If you have any dental work done, tell your dentist you are receiving this medicine. ?Avoid taking products that contain aspirin, acetaminophen, ibuprofen, naproxen, or ketoprofen unless instructed by your doctor. These medicines may hide a fever. ?Do not become pregnant while taking this medicine or for 6 months after stopping it. Women should inform their doctor if they wish to become pregnant or think they might be pregnant. Men should not father a child while taking this medicine and for 3 months after stopping it. There is a potential for serious side effects to an unborn child. Talk to your health care professional or pharmacist for more information. Do not breast-feed an infant while taking this medicine or for at least 1 week after stopping it. ?Men should inform their doctors if they wish to father a child. This medicine may lower sperm counts. Talk with your doctor or health care professional if you  are concerned about your fertility. ?What side effects may I notice from receiving this medication? ?Side effects that you should report to your doctor or health care professional as soon as possible: ?allergic reactions like skin rash, itching or hives, swelling of the face, lips, or tongue ?breathing problems ?pain, redness, or irritation at site where injected ?signs and symptoms of a dangerous change in heartbeat or heart rhythm like chest pain; dizziness; fast or irregular heartbeat; palpitations; feeling faint or lightheaded, falls;  breathing problems ?signs of decreased platelets or bleeding - bruising, pinpoint red spots on the skin, black, tarry stools, blood in the urine ?signs of decreased red blood cells - unusually weak or tired, feeling

## 2021-08-04 ENCOUNTER — Encounter: Payer: Self-pay | Admitting: Hematology and Oncology

## 2021-08-04 ENCOUNTER — Encounter: Payer: Self-pay | Admitting: Oncology

## 2021-08-04 ENCOUNTER — Other Ambulatory Visit: Payer: Self-pay | Admitting: Hematology and Oncology

## 2021-08-04 DIAGNOSIS — C252 Malignant neoplasm of tail of pancreas: Secondary | ICD-10-CM

## 2021-08-04 DIAGNOSIS — K56609 Unspecified intestinal obstruction, unspecified as to partial versus complete obstruction: Secondary | ICD-10-CM

## 2021-08-04 MED ORDER — LIDOCAINE-PRILOCAINE 2.5-2.5 % EX CREA: TOPICAL_CREAM | CUTANEOUS | 3 refills | Status: DC

## 2021-08-05 ENCOUNTER — Telehealth: Payer: Self-pay

## 2021-08-05 NOTE — Telephone Encounter (Signed)
Received call from [place].  She wanted to know if you would sign orders for pt's feeding supplies, etc? ? ?Dr Hinton Rao: yes, of course ?

## 2021-08-09 ENCOUNTER — Other Ambulatory Visit: Payer: Self-pay

## 2021-08-10 ENCOUNTER — Inpatient Hospital Stay (INDEPENDENT_AMBULATORY_CARE_PROVIDER_SITE_OTHER): Payer: Medicare Other | Admitting: Hematology and Oncology

## 2021-08-10 ENCOUNTER — Inpatient Hospital Stay: Payer: Medicare Other

## 2021-08-10 ENCOUNTER — Encounter: Payer: Self-pay | Admitting: Hematology and Oncology

## 2021-08-10 DIAGNOSIS — C252 Malignant neoplasm of tail of pancreas: Secondary | ICD-10-CM

## 2021-08-10 DIAGNOSIS — C259 Malignant neoplasm of pancreas, unspecified: Secondary | ICD-10-CM | POA: Diagnosis not present

## 2021-08-10 DIAGNOSIS — Z800 Family history of malignant neoplasm of digestive organs: Secondary | ICD-10-CM | POA: Diagnosis not present

## 2021-08-10 DIAGNOSIS — Z8049 Family history of malignant neoplasm of other genital organs: Secondary | ICD-10-CM | POA: Diagnosis not present

## 2021-08-10 DIAGNOSIS — K56609 Unspecified intestinal obstruction, unspecified as to partial versus complete obstruction: Secondary | ICD-10-CM

## 2021-08-10 LAB — CBC AND DIFFERENTIAL
HCT: 26 — AB (ref 36–46)
Hemoglobin: 8.5 — AB (ref 12.0–16.0)
Neutrophils Absolute: 3.34
Platelets: 277 10*3/uL (ref 150–400)
WBC: 4.4

## 2021-08-10 LAB — BASIC METABOLIC PANEL
BUN: 14 (ref 4–21)
CO2: 24 — AB (ref 13–22)
Chloride: 101 (ref 99–108)
Creatinine: 0.9 (ref 0.5–1.1)
Glucose: 181
Potassium: 3.7 mEq/L (ref 3.5–5.1)
Sodium: 137 (ref 137–147)

## 2021-08-10 LAB — HEPATIC FUNCTION PANEL
ALT: 24 U/L (ref 7–35)
AST: 29 (ref 13–35)
Alkaline Phosphatase: 219 — AB (ref 25–125)
Bilirubin, Total: 0.3

## 2021-08-10 LAB — COMPREHENSIVE METABOLIC PANEL
Albumin: 3.2 — AB (ref 3.5–5.0)
Calcium: 8.6 — AB (ref 8.7–10.7)

## 2021-08-10 LAB — CBC: RBC: 2.92 — AB (ref 3.87–5.11)

## 2021-08-10 MED ORDER — FLUOXETINE HCL 20 MG PO CAPS: 20.0000 mg | ORAL_CAPSULE | Freq: Every day | ORAL | 3 refills | Status: AC

## 2021-08-10 NOTE — Progress Notes (Signed)
Patient Care Team: Street, [name], MD as PCP - General (Family Medicine) Daryel November, MD as Consulting Physician (Gastroenterology) Melodye Ped, NP as Nurse Practitioner (Hematology and Oncology) Dwan Bolt, MD as Consulting Physician (General Surgery) Derwood Kaplan, MD as Consulting Physician (Oncology)  Clinic Day:  08/10/2021  Referring physician: Emmaline Kluver, *  ASSESSMENT & PLAN:   Assessment & Plan: Pancreatic cancer [facility]) Pancreatic adenocarcinoma, with a mass measuring 3.3 x 3.5 cm on MRI. She received her first dose of gemcitabine and was hospitalized a few days later with fever, poor nutrition and severe constipation. She was discharged after a couple of days and readmitted, again for severe constipation. Due to these hospitalizations, she missed day 8 of cycle 1. She is due for cycle 2, day 15 next week and is doing well today. She has some complaints about her feeding tube being uncomfortable at night when she sleeps, but is willing to leave it in for now. I will have her meet with our dietician to see if she has suggestions for managing the tube. She is also experiencing more depression. I will send in Prozac today.    The patient understands the plans discussed today and is in agreement with them.  She knows to contact our office if she develops concerns prior to her next appointment.   Melodye Ped, NP  [facility] [address] [place] [address] Dept: [phone] Dept Fax: [phone]   Orders Placed This Encounter  Procedures   CBC and differential    This external order was created through the Results Console.   CBC    This external order was created through the Results Console.   Basic metabolic panel    This external order was created through the Results Console.   Comprehensive metabolic panel    This external order was created through the  Results Console.   Hepatic function panel    This external order was created through the Results Console.      CHIEF COMPLAINT:  CC: A 76 year old female with history of pancreatic cancer here for 2 week evaluation  Current Treatment:  Gemcitabine  INTERVAL HISTORY:  Lyza is here today for repeat clinical assessment. She denies fevers or chills. She denies pain. Her appetite is good. Her weight has been stable.  I have reviewed the past medical history, past surgical history, social history and family history with the patient and they are unchanged from previous note.  ALLERGIES:  is allergic to iodinated contrast media, naratriptan, penicillins, iodine, ciprofloxacin, duragesic-100 [fentanyl], and penicillin g.  MEDICATIONS:  Current Outpatient Medications  Medication Sig Dispense Refill   FLUoxetine (PROZAC) 20 MG capsule Take 1 capsule (20 mg total) by mouth daily. 30 capsule 3   ACCU-CHEK GUIDE test strip 2 (two) times daily.     Acetaminophen 500 MG capsule Take by mouth.     bisacodyl (DULCOLAX) 10 MG suppository Place rectally.     Calcium Carbonate-Vitamin D 600-200 MG-UNIT TABS Take 1 tablet by mouth 2 (two) times daily with a meal.     CAMPHOR EX Apply 1 application topically as needed (pain).     Carboxymethylcellulose Sodium (DRY EYE RELIEF OP) Apply 1 drop to eye as needed (Dryness).     Cholecalciferol 25 MCG (1000 UT) tablet Take by mouth.     dicyclomine (BENTYL) 20 MG tablet TAKE 1/2 TO 1 TABLET BY MOUTH UP TO FOUR  TIMES DAILY AS NEEDED FOR ABDOMINAL CRAMPS     diltiazem (TIAZAC) 180 MG 24 hr capsule Take by mouth.     Docusate Sodium (DSS) 100 MG CAPS Take 1 capsule by mouth daily.     FEROSUL 325 (65 Fe) MG tablet Take 325 mg by mouth daily.     gabapentin (NEURONTIN) 300 MG capsule Neurontin 300 mg capsule (Patient not taking: Reported on 06/13/2021)     GLUMETZA 500 MG 24 hr tablet Take by mouth.     Hyoscyamine Sulfate SL 0.125 MG SUBL DISSOLVE 1 TO 2 TABLETS  UNDER THE TONGUE EVERY 6 TO 8 HOURS AS NEEDED FOR PAIN     ibuprofen (ADVIL) 800 MG tablet Take 800 mg by mouth 3 (three) times daily.     lactulose (CONSTULOSE) 10 GM/15ML solution Take 15 mLs (10 g total) by mouth 3 (three) times daily. 237 mL 0   lidocaine-prilocaine (EMLA) cream Apply to affected area once 30 g 3   magnesium hydroxide (MILK OF MAGNESIA) 800 MG/5ML suspension Take 30 mLs by mouth daily.     meclizine (ANTIVERT) 25 MG tablet Take 25 mg by mouth daily as needed for dizziness.     metFORMIN (GLUCOPHAGE) 500 MG tablet Take by mouth.     metroNIDAZOLE (FLAGYL) 500 MG tablet Take 500 mg by mouth 3 (three) times daily.     MOVANTIK 12.5 MG TABS tablet Take 12.5 mg by mouth daily as needed (constipation).     nitroGLYCERIN (NITROSTAT) 0.4 MG SL tablet Place 0.4 mg under the tongue every 5 (five) minutes as needed for chest pain.     olmesartan (BENICAR) 20 MG tablet Take 20 mg by mouth 2 (two) times daily.     ondansetron (ZOFRAN) 4 MG tablet Take 1 tablet (4 mg total) by mouth every 4 (four) hours as needed for nausea. 90 tablet 3   oxyCODONE (OXY IR/ROXICODONE) 5 MG immediate release tablet Take 1-2 tablets (5-10 mg total) by mouth every 4 (four) hours as needed. 120 tablet 0   prochlorperazine (COMPAZINE) 10 MG tablet Take 1 tablet (10 mg total) by mouth every 6 (six) hours as needed (Nausea or vomiting). 30 tablet 1   RABEprazole (ACIPHEX) 20 MG tablet Take 20 mg by mouth 2 (two) times daily.     STIMULANT LAXATIVE 8.6-50 MG tablet Take 2 tablets by mouth 2 (two) times daily.     sucralfate (CARAFATE) 1 GM/10ML suspension Take 1 g by mouth 2 (two) times daily.     tiZANidine (ZANAFLEX) 4 MG capsule Take by mouth.     traMADol (ULTRAM) 50 MG tablet Take 50 mg by mouth every 4 (four) hours as needed.     No current facility-administered medications for this visit.   Facility-Administered Medications Ordered in Other Visits  Medication Dose Route Frequency Provider Last Rate Last  Admin   sodium chloride flush (NS) 0.9 % injection 10 mL  10 mL Intracatheter PRN [place] Scrape A, NP   10 mL at 06/30/21 1340    HISTORY OF PRESENT ILLNESS:   Oncology History  Pancreatic cancer ([place])  04/08/2021 Cancer Staging   Staging form: Exocrine Pancreas, AJCC 8th Edition - Clinical stage from 04/08/2021: Stage IB (cT2, cN0, cM0) - Signed by Derwood Kaplan, MD on 06/10/2021 Histopathologic type: Mucinous adenocarcinoma Stage prefix: Initial diagnosis Total positive nodes: 0 Histologic grade (G): GX Histologic grading system: 3 grade system Tumor size (mm): 35 Diagnostic confirmation: Positive histology PLUS positive immunophenotyping and/or positive genetic  studies Specimen type: Endoscopy with Biopsy Staged by: Managing physician Stage used in treatment planning: Yes National guidelines used in treatment planning: Yes Type of national guideline used in treatment planning: NCCN    04/28/2021 Initial Diagnosis   Pancreatic cancer ([place])    06/27/2021 -  Chemotherapy   Patient is on Treatment Plan : PANCREAS Gemcitabine D1 & D15 ONLY q28d x 4 Cycles (day 8 deleted 07/13/21)          REVIEW OF SYSTEMS:   Constitutional: Denies fevers, chills or abnormal weight loss Eyes: Denies blurriness of vision Ears, nose, mouth, throat, and face: Denies mucositis or sore throat Respiratory: Denies cough, dyspnea or wheezes Cardiovascular: Denies palpitation, chest discomfort or lower extremity swelling Gastrointestinal:  Denies nausea, heartburn or change in bowel habits Skin: Denies abnormal skin rashes Lymphatics: Denies new lymphadenopathy or easy bruising Neurological:Denies numbness, tingling or new weaknesses Behavioral/Psych: Mood is stable, no new changes  All other systems were reviewed with the patient and are negative.   VITALS:  Blood pressure 122/75, pulse (!) 109, temperature 98.4 F (36.9 C), resp. rate 14, height 4' 8.7" (1.44 m), weight 102 lb 14.4 oz  (46.7 kg), SpO2 100 %.  Wt Readings from Last 3 Encounters:  08/10/21 102 lb 14.4 oz (46.7 kg)  08/03/21 104 lb 4 oz (47.3 kg)  07/27/21 106 lb 6.4 oz (48.3 kg)    Body mass index is 22.5 kg/m.  Performance status (ECOG): 1 - Symptomatic but completely ambulatory  PHYSICAL EXAM:   GENERAL:alert, no distress and comfortable SKIN: skin color, texture, turgor are normal, no rashes or significant lesions EYES: normal, Conjunctiva are pink and non-injected, sclera clear OROPHARYNX:no exudate, no erythema and lips, buccal mucosa, and tongue normal  NECK: supple, thyroid normal size, non-tender, without nodularity LYMPH:  no palpable lymphadenopathy in the cervical, axillary or inguinal LUNGS: clear to auscultation and percussion with normal breathing effort HEART: regular rate & rhythm and no murmurs and no lower extremity edema ABDOMEN:abdomen soft, non-tender and normal bowel sounds Musculoskeletal:no cyanosis of digits and no clubbing  NEURO: alert & oriented x 3 with fluent speech, no focal motor/sensory deficits  LABORATORY DATA:  I have reviewed the data as listed    Component Value Date/Time   NA 137 08/10/2021 0000   K 3.7 08/10/2021 0000   CL 101 08/10/2021 0000   CO2 24 (A) 08/10/2021 0000   GLUCOSE 127 (H) 05/06/2021 0649   BUN 14 08/10/2021 0000   CREATININE 0.9 08/10/2021 0000   CREATININE 0.88 05/06/2021 0649   CALCIUM 8.6 (A) 08/10/2021 0000   PROT 6 (A) 07/27/2021 0000   ALBUMIN 3.2 (A) 08/10/2021 0000   AST 29 08/10/2021 0000   ALT 24 08/10/2021 0000   ALKPHOS 219 (A) 08/10/2021 0000   BILITOT 1.2 05/05/2021 0951   GFRNONAA >60 05/06/2021 0649   GFRAA >60 10/11/2016 1100    No results found for: SPEP, UPEP  Lab Results  Component Value Date   WBC 4.4 08/10/2021   NEUTROABS 3.34 08/10/2021   HGB 8.5 (A) 08/10/2021   HCT 26 (A) 08/10/2021   MCV 86.7 05/06/2021   PLT 277 08/10/2021      Chemistry      Component Value Date/Time   NA 137 08/10/2021  0000   K 3.7 08/10/2021 0000   CL 101 08/10/2021 0000   CO2 24 (A) 08/10/2021 0000   BUN 14 08/10/2021 0000   CREATININE 0.9 08/10/2021 0000   CREATININE 0.88 05/06/2021 3875  GLU 181 08/10/2021 0000      Component Value Date/Time   CALCIUM 8.6 (A) 08/10/2021 0000   ALKPHOS 219 (A) 08/10/2021 0000   AST 29 08/10/2021 0000   ALT 24 08/10/2021 0000   BILITOT 1.2 05/05/2021 0951       RADIOGRAPHIC STUDIES: I have personally reviewed the radiological images as listed and agreed with the findings in the report. No results found.

## 2021-08-10 NOTE — Assessment & Plan Note (Signed)
Pancreatic adenocarcinoma, with a mass measuring 3.3 x 3.5 cm on MRI. She received her first dose of gemcitabine and was hospitalized a few days later with fever, poor nutrition and severe constipation. She was discharged after a couple of days and readmitted, again for severe constipation. Due to these hospitalizations, she missed day 8 of cycle 1. She is due for cycle 2, day 15 next week and is doing well today. She has some complaints about her feeding tube being uncomfortable at night when she sleeps, but is willing to leave it in for now. I will have her meet with our dietician to see if she has suggestions for managing the tube. She is also experiencing more depression. I will send in Prozac today.  ?

## 2021-08-11 ENCOUNTER — Encounter: Payer: Self-pay | Admitting: Oncology

## 2021-08-12 ENCOUNTER — Telehealth: Payer: Self-pay

## 2021-08-12 NOTE — Telephone Encounter (Signed)
-----   Message from Mort Sawyers sent at 08/12/2021  2:50 PM EDT ----- Supplies:  Drainage bag: Model # [id number] will pay for this  Drain Sponges Paper tape Sterile cotton swabs Saline    Patient called and stated we need to fax in a Rx for these items to [facility], can you write please

## 2021-08-12 NOTE — Telephone Encounter (Signed)
Dayton Scrape, FNP-BC taken care of the prescriptions

## 2021-08-16 ENCOUNTER — Other Ambulatory Visit: Payer: Self-pay

## 2021-08-16 MED FILL — Gemcitabine HCl Inj 1 GM/26.3ML (38 MG/ML) (Base Equiv): INTRAVENOUS | Qty: 29 | Status: AC

## 2021-08-17 ENCOUNTER — Inpatient Hospital Stay: Payer: Medicare Other

## 2021-08-17 VITALS — BP 118/74 | HR 88 | Temp 98.1°F | Resp 18 | Ht <= 58 in | Wt 102.0 lb

## 2021-08-17 DIAGNOSIS — K56609 Unspecified intestinal obstruction, unspecified as to partial versus complete obstruction: Secondary | ICD-10-CM

## 2021-08-17 DIAGNOSIS — Z5111 Encounter for antineoplastic chemotherapy: Secondary | ICD-10-CM | POA: Diagnosis not present

## 2021-08-17 DIAGNOSIS — C252 Malignant neoplasm of tail of pancreas: Secondary | ICD-10-CM | POA: Diagnosis not present

## 2021-08-17 MED ORDER — SODIUM CHLORIDE 0.9 % IV SOLN
800.0000 mg/m2 | Freq: Once | INTRAVENOUS | Status: AC
  Administered 2021-08-17: 1102 mg via INTRAVENOUS
  Filled 2021-08-17: qty 26.3

## 2021-08-17 MED ORDER — HEPARIN SOD (PORK) LOCK FLUSH 100 UNIT/ML IV SOLN
500.0000 [IU] | Freq: Once | INTRAVENOUS | Status: AC | PRN
  Administered 2021-08-17: 500 [IU]

## 2021-08-17 MED ORDER — SODIUM CHLORIDE 0.9% FLUSH
10.0000 mL | INTRAVENOUS | Status: DC | PRN
  Administered 2021-08-17: 10 mL

## 2021-08-17 MED ORDER — SODIUM CHLORIDE 0.9 % IV SOLN: Freq: Once | INTRAVENOUS | Status: AC

## 2021-08-17 MED ORDER — ONDANSETRON HCL 4 MG/2ML IJ SOLN
8.0000 mg | Freq: Once | INTRAMUSCULAR | Status: AC
  Administered 2021-08-17: 8 mg via INTRAVENOUS
  Filled 2021-08-17: qty 4

## 2021-08-17 NOTE — Patient Instructions (Signed)
Gemcitabine injection Qu es este medicamento? La GEMCITABINA es un agente quimioteraputico. Este medicamento se [place] para tratar muchos tipos de cncer, tales como cncer de mama, cncer de pulmn, cncer de pncreas y cncer de ovario. Este medicamento puede ser utilizado para otros usos; si tiene alguna pregunta consulte con su proveedor de atencin mdica o con su farmacutico. MARCAS COMUNES: Gemzar, Infugem Qu le debo informar a mi profesional de la salud antes de tomar este medicamento? Necesitan saber si usted presenta alguno de los siguientes problemas o situaciones: trastornos sanguneos infeccin enfermedad renal enfermedad heptica enfermedad pulmonar o respiratoria, como asma terapia de radiacin en curso o reciente una reaccin alrgica o inusual a la gemcitabina, a otros medicamentos quimioteraputicos, a otros medicamentos, alimentos, colorantes o conservantes si est embarazada o buscando quedar embarazada si est amamantando a un beb Cmo debo utilizar este medicamento? Este medicamento se administra mediante infusin por va intravenosa. Lo administra un profesional de la salud calificado en un hospital o en un entorno clnico. Hable con su pediatra para informarse acerca del uso de este medicamento en nios. Puede requerir atencin especial. Sobredosis: Pngase en contacto inmediatamente con un centro toxicolgico o una sala de urgencia si usted cree que haya tomado demasiado medicamento. ATENCIN: [employer] es solo para usted. No comparta este medicamento con nadie. Qu sucede si me olvido de una dosis? Es importante no olvidar ninguna dosis. Informe a su mdico o a su profesional de la salud si no puede asistir a Photographer. Qu puede interactuar con este medicamento? medicamentos para incrementar los conteos sanguneos, tales como filgrastim, pegfilgrastim, sargramostim algunos otros agentes quimioteraputicos como cisplatino vacunas Consulte a su mdico  o a su profesional de la salud antes de tomar cualquiera de los siguientes medicamentos: acetaminofeno aspirina ibuprofeno quetoprofeno naproxeno Puede ser que esta lista no menciona todas las posibles interacciones. Informe a su profesional de [employer]	[place] de [employer] productos a base de hierbas, medicamentos de [place] o suplementos nutritivos que est tomando. Si usted fuma, consume bebidas alcohlicas o si utiliza drogas ilegales, indqueselo tambin a su profesional de [employer]	[place]. Algunas sustancias pueden interactuar con su medicamento. A qu debo estar atento al usar [employer]? Visite a su mdico para que revise su evolucin. Este medicamento podra hacerle sentir un Nurse, mental health. Esto es normal, ya que la quimioterapia puede afectar tanto a las clulas sanas como a las clulas cancerosas. Si presenta algn efecto secundario, infrmelo. Contine con el tratamiento aun si se siente enfermo, a menos que su mdico le indique que lo suspenda. En algunos casos, podra recibir [employer]ands para ayudarlo con los efectos secundarios. Siga todas las instrucciones para usarlos. Consulte a su mdico o a su profesional de la salud si tiene fiebre, escalofros o dolor de garganta, o cualquier otro sntoma de resfro o gripe. No se trate usted mismo. Este medicamento reduce la capacidad del cuerpo para combatir infecciones. Trate de no acercarse a personas que estn enfermas. Este medicamento podra aumentar el riesgo de moretones o sangrado. Consulte a su mdico o a su profesional de la salud si observa sangrados inusuales. Proceda con cuidado al cepillar sus dientes, usar hilo dental o Risk manager palillos para los dientes, ya que podra contraer una infeccin o Therapist, art con mayor facilidad. Si se somete a algn tratamiento dental, informe a su dentista que est [employer]. Evite usar productos que contienen aspirina, acetaminofeno, ibuprofeno, naproxeno o ketoprofeno, a menos  que as lo indique su mdico. Estos productos pueden  ocultar la fiebre. No debe quedar embarazada mientras est usando este medicamento o por 6 meses despus de dejar de usarlo. Las mujeres deben informar a su mdico si estn buscando quedar embarazadas o si creen que podran estar embarazadas. Los hombres no deben tener hijos mientras estn recibiendo Coca[employer]a y [place] 3 meses despus de dejar de usarlo. Existe la posibilidad de efectos secundarios graves en un beb sin nacer. Para obtener ms informacin, hable con su profesional de la salud o su farmacutico. No debe Economist a un beb mientras est tomando este medicamento o durante al menos 1 semana despus de dejar de usarlo. Los hombres deben informar a su mdico si quieren tener hijos. Este medicamento puede reducir el recuento de esperma. Hable con su mdico o su profesional de la salud si est preocupado por su fertilidad. Qu efectos secundarios puedo tener al [employer] este medicamento? Efectos secundarios que debe informar a su mdico o a Barrister's clerk de la salud tan pronto como sea posible: Chief of Staff, como erupcin cutnea, comezn/picazn o urticaria, e hinchazn de la cara, los labios o la lengua problemas Theatre stage manager, enrojecimiento o Actor de la inyeccin signos y sntomas de un cambio peligroso en el pulso o ritmo cardiaco, tales como dolor en el pecho; mareos; ritmo cardiaco rpido o irregular; palpitaciones; sensacin de desmayo o aturdimiento, cadas; problemas respiratorios signos de disminucin en la cantidad de plaquetas o sangrado: moretones, puntos rojos en la piel, heces de color negro y aspecto alquitranado, sangre en la orina signos de disminucin en la cantidad de glbulos rojos: cansancio o debilidad inusual, sensacin de Secondary school teacher o aturdimiento, cadas signos de infeccin: fiebre o escalofros, tos, dolor de garganta, dolor o dificultad para orinar signos y sntomas de lesin  al rin, tales como dificultad para orinar o cambios en la cantidad de orina signos y sntomas de lesin al hgado, como orina amarilla oscura o [place]; sensacin general de estar enfermo o sntomas gripales; heces claras; prdida del apetito; nuseas; dolor en la regin abdominal superior derecha; cansancio o debilidad inusual; color amarillento de los ojos o la piel hinchazn de tobillos, pies, manos Efectos secundarios que generalmente no requieren atencin mdica (infrmelos a su mdico o a su profesional de la salud si persisten o si son molestos): estreimiento diarrea cada del cabello prdida del apetito nuseas erupcin cutnea vmito Puede ser que esta lista no menciona todos los posibles efectos secundarios. Comunquese a su mdico por asesoramiento mdico [employer] Usted puede informar los efectos secundarios a la FDA por telfono al 1-800-FDA-1088. Dnde debo guardar mi medicina? Este medicamento se administra en hospitales o clnicas y no necesitar guardarlo en su domicilio. ATENCIN: Este folleto es un resumen. Puede ser que no cubra toda la posible informacin. Si usted tiene preguntas acerca de esta medicina, consulte con su mdico, su farmacutico o su profesional de Technical sales engineer.  2023 Elsevier/Gold Standard (2017-07-24 00:00:00)

## 2021-08-23 ENCOUNTER — Inpatient Hospital Stay: Payer: Medicare Other

## 2021-08-23 ENCOUNTER — Encounter: Payer: Self-pay | Admitting: Dietician

## 2021-08-23 ENCOUNTER — Inpatient Hospital Stay (INDEPENDENT_AMBULATORY_CARE_PROVIDER_SITE_OTHER): Payer: Medicare Other | Admitting: Hematology and Oncology

## 2021-08-23 ENCOUNTER — Encounter: Payer: Self-pay | Admitting: Hematology and Oncology

## 2021-08-23 DIAGNOSIS — C252 Malignant neoplasm of tail of pancreas: Secondary | ICD-10-CM

## 2021-08-23 DIAGNOSIS — K56609 Unspecified intestinal obstruction, unspecified as to partial versus complete obstruction: Secondary | ICD-10-CM

## 2021-08-23 DIAGNOSIS — D649 Anemia, unspecified: Secondary | ICD-10-CM | POA: Diagnosis not present

## 2021-08-23 DIAGNOSIS — C259 Malignant neoplasm of pancreas, unspecified: Secondary | ICD-10-CM | POA: Diagnosis not present

## 2021-08-23 LAB — HEPATIC FUNCTION PANEL
ALT: 18 U/L (ref 7–35)
AST: 30 (ref 13–35)
Alkaline Phosphatase: 110 (ref 25–125)
Bilirubin, Total: 0.6

## 2021-08-23 LAB — BASIC METABOLIC PANEL WITH GFR
BUN: 11 (ref 4–21)
CO2: 29 — AB (ref 13–22)
Chloride: 100 (ref 99–108)
Creatinine: 0.8 (ref 0.5–1.1)
Glucose: 102
Potassium: 4.9 meq/L (ref 3.5–5.1)
Sodium: 136 — AB (ref 137–147)

## 2021-08-23 LAB — CBC AND DIFFERENTIAL
HCT: 44 (ref 36–46)
Hemoglobin: 14.5 (ref 12.0–16.0)
Neutrophils Absolute: 4.73
Platelets: 205 10*3/uL (ref 150–400)
WBC: 8.6

## 2021-08-23 LAB — COMPREHENSIVE METABOLIC PANEL
Albumin: 4.1 (ref 3.5–5.0)
Calcium: 9.1 (ref 8.7–10.7)

## 2021-08-23 LAB — CBC: RBC: 4.95 (ref 3.87–5.11)

## 2021-08-23 NOTE — Progress Notes (Unsigned)
Nutrition Follow Up:  Met with patient and spouse at NP request.  Spouse translated for patient.  Spouse reports patient is not using J Tube for feeds and is only flushing every 2-3 days.  Patient feel very bad today and didn't want to be seen.  Spouse states she is eating well overall and he doesn't know why she's losing weight.  He reports she is only taking 1 Ensure plus per day, encouraged him to try to get 2-3 Ensure plus per day.  Provided coupons.  April Manson, RDN, LDN Registered Dietitian, [place] Part Time Remote (Usual office hours: Tuesday-Thursday) Remote Office: [phone]

## 2021-08-23 NOTE — Progress Notes (Signed)
Patient Care Team: Street, [name], MD as PCP - General (Family Medicine) Daryel November, MD as Consulting Physician (Gastroenterology) Melodye Ped, NP as Nurse Practitioner (Hematology and Oncology) Dwan Bolt, MD as Consulting Physician (General Surgery) Derwood Kaplan, MD as Consulting Physician (Oncology)  Clinic Day:  08/23/2021  Referring physician: Street, [name], *  ASSESSMENT & PLAN:   Assessment & Plan: Pancreatic cancer [facility]) Pancreatic adenocarcinoma, with a mass measuring 3.3 x 3.5 cm on MRI. She received her first dose of gemcitabine and was hospitalized a few days later with fever, poor nutrition and severe constipation. She was discharged after a couple of days and readmitted, again for severe constipation. Due to these hospitalizations, she missed day 8 of cycle 1. She is due for cycle 2, day 15 next week and is doing well today. She has some complaints about her feeding tube being uncomfortable at night when she sleeps, but is willing to leave it in for now. I will have her meet with our dietician to see if she has suggestions for managing the tube. She is doing well; however, she is having trouble sleeping. Her husband states that she often is awake all night. She is experiencing ongoing nausea. I will send in ativan and she will take this at night to hopefully help with her nausea as well as sleep.     The patient understands the plans discussed today and is in agreement with them.  She knows to contact our office if she develops concerns prior to her next appointment.    Melodye Ped, NP  [facility] [address] [place] [address] Dept: [phone] Dept Fax: [phone]   Orders Placed This Encounter  Procedures   CBC and differential    This external order was created through the Results Console.   CBC    This external order was created through the  Results Console.   Basic metabolic panel    This external order was created through the Results Console.   Comprehensive metabolic panel    This external order was created through the Results Console.   Hepatic function panel    This external order was created through the Results Console.      CHIEF COMPLAINT:  CC: A 76 year old female with history of pancreatic cancer  Current Treatment:  Gemcitabine  INTERVAL HISTORY:  Leah Pruitt is here today for repeat clinical assessment. She denies fevers or chills. She denies pain. Her appetite is good. Her weight has decreased 2  pounds over last week .  I have reviewed the past medical history, past surgical history, social history and family history with the patient and they are unchanged from previous note.  ALLERGIES:  is allergic to iodinated contrast media, naratriptan, penicillins, iodine, ciprofloxacin, duragesic-100 [fentanyl], and penicillin g.  MEDICATIONS:  Current Outpatient Medications  Medication Sig Dispense Refill   ACCU-CHEK GUIDE test strip 2 (two) times daily.     Acetaminophen 500 MG capsule Take by mouth.     bisacodyl (DULCOLAX) 10 MG suppository Place rectally.     Calcium Carbonate-Vitamin D 600-200 MG-UNIT TABS Take 1 tablet by mouth 2 (two) times daily with a meal.     CAMPHOR EX Apply 1 application topically as needed (pain).     Carboxymethylcellulose Sodium (DRY EYE RELIEF OP) Apply 1 drop to eye as needed (Dryness).     Cholecalciferol 25 MCG (1000 UT) tablet Take by mouth.  dicyclomine (BENTYL) 20 MG tablet TAKE 1/2 TO 1 TABLET BY MOUTH UP TO FOUR TIMES DAILY AS NEEDED FOR ABDOMINAL CRAMPS     diltiazem (TIAZAC) 180 MG 24 hr capsule Take by mouth.     Docusate Sodium (DSS) 100 MG CAPS Take 1 capsule by mouth daily.     FEROSUL 325 (65 Fe) MG tablet Take 325 mg by mouth daily.     FLUoxetine (PROZAC) 20 MG capsule Take 1 capsule (20 mg total) by mouth daily. 30 capsule 3   gabapentin (NEURONTIN) 300 MG  capsule Neurontin 300 mg capsule (Patient not taking: Reported on 06/13/2021)     GLUMETZA 500 MG 24 hr tablet Take by mouth.     Hyoscyamine Sulfate SL 0.125 MG SUBL DISSOLVE 1 TO 2 TABLETS UNDER THE TONGUE EVERY 6 TO 8 HOURS AS NEEDED FOR PAIN     ibuprofen (ADVIL) 800 MG tablet Take 800 mg by mouth 3 (three) times daily.     lactulose (CONSTULOSE) 10 GM/15ML solution Take 15 mLs (10 g total) by mouth 3 (three) times daily. 237 mL 0   lidocaine-prilocaine (EMLA) cream Apply to affected area once 30 g 3   magnesium hydroxide (MILK OF MAGNESIA) 800 MG/5ML suspension Take 30 mLs by mouth daily.     meclizine (ANTIVERT) 25 MG tablet Take 25 mg by mouth daily as needed for dizziness.     metFORMIN (GLUCOPHAGE) 500 MG tablet Take by mouth.     metroNIDAZOLE (FLAGYL) 500 MG tablet Take 500 mg by mouth 3 (three) times daily.     MOVANTIK 12.5 MG TABS tablet Take 12.5 mg by mouth daily as needed (constipation).     nitroGLYCERIN (NITROSTAT) 0.4 MG SL tablet Place 0.4 mg under the tongue every 5 (five) minutes as needed for chest pain.     olmesartan (BENICAR) 20 MG tablet Take 20 mg by mouth 2 (two) times daily.     ondansetron (ZOFRAN) 4 MG tablet Take 1 tablet (4 mg total) by mouth every 4 (four) hours as needed for nausea. 90 tablet 3   prochlorperazine (COMPAZINE) 10 MG tablet Take 1 tablet (10 mg total) by mouth every 6 (six) hours as needed (Nausea or vomiting). 30 tablet 1   RABEprazole (ACIPHEX) 20 MG tablet Take 20 mg by mouth 2 (two) times daily.     STIMULANT LAXATIVE 8.6-50 MG tablet Take 2 tablets by mouth 2 (two) times daily.     sucralfate (CARAFATE) 1 GM/10ML suspension Take 1 g by mouth 2 (two) times daily.     tiZANidine (ZANAFLEX) 4 MG capsule Take by mouth.     traMADol (ULTRAM) 50 MG tablet Take 50 mg by mouth every 4 (four) hours as needed.     No current facility-administered medications for this visit.   Facility-Administered Medications Ordered in Other Visits  Medication  Dose Route Frequency Provider Last Rate Last Admin   sodium chloride flush (NS) 0.9 % injection 10 mL  10 mL Intracatheter PRN [place] Scrape A, NP   10 mL at 06/30/21 1340    HISTORY OF PRESENT ILLNESS:   Oncology History  Pancreatic cancer ([address])  04/08/2021 Cancer Staging   Staging form: Exocrine Pancreas, AJCC 8th Edition - Clinical stage from 04/08/2021: Stage IB (cT2, cN0, cM0) - Signed by Derwood Kaplan, MD on 06/10/2021 Histopathologic type: Mucinous adenocarcinoma Stage prefix: Initial diagnosis Total positive nodes: 0 Histologic grade (G): GX Histologic grading system: 3 grade system Tumor size (mm): 35 Diagnostic confirmation: Positive  histology PLUS positive immunophenotyping and/or positive genetic studies Specimen type: Endoscopy with Biopsy Staged by: Managing physician Stage used in treatment planning: Yes National guidelines used in treatment planning: Yes Type of national guideline used in treatment planning: NCCN    04/28/2021 Initial Diagnosis   Pancreatic cancer ([facility])    06/27/2021 -  Chemotherapy   Patient is on Treatment Plan : PANCREAS Gemcitabine D1 & D15 ONLY q28d x 4 Cycles (day 8 deleted 07/13/21)          REVIEW OF SYSTEMS:   Constitutional: Denies fevers, chills or abnormal weight loss Eyes: Denies blurriness of vision Ears, nose, mouth, throat, and face: Denies mucositis or sore throat Respiratory: Denies cough, dyspnea or wheezes Cardiovascular: Denies palpitation, chest discomfort or lower extremity swelling Gastrointestinal:  Denies nausea, heartburn or change in bowel habits Skin: Denies abnormal skin rashes Lymphatics: Denies new lymphadenopathy or easy bruising Neurological:Denies numbness, tingling or new weaknesses Behavioral/Psych: Mood is stable, no new changes  All other systems were reviewed with the patient and are negative.   VITALS:  There were no vitals taken for this visit.  Wt Readings from Last 3 Encounters:   08/17/21 102 lb (46.3 kg)  08/10/21 102 lb 14.4 oz (46.7 kg)  08/03/21 104 lb 4 oz (47.3 kg)    There is no height or weight on file to calculate BMI.  Performance status (ECOG): 1 - Symptomatic but completely ambulatory  PHYSICAL EXAM:   GENERAL:alert, no distress and comfortable SKIN: skin color, texture, turgor are normal, no rashes or significant lesions EYES: normal, Conjunctiva are pink and non-injected, sclera clear OROPHARYNX:no exudate, no erythema and lips, buccal mucosa, and tongue normal  NECK: supple, thyroid normal size, non-tender, without nodularity LYMPH:  no palpable lymphadenopathy in the cervical, axillary or inguinal LUNGS: clear to auscultation and percussion with normal breathing effort HEART: regular rate & rhythm and no murmurs and no lower extremity edema ABDOMEN:abdomen soft, non-tender and normal bowel sounds Musculoskeletal:no cyanosis of digits and no clubbing  NEURO: alert & oriented x 3 with fluent speech, no focal motor/sensory deficits  LABORATORY DATA:  I have reviewed the data as listed    Component Value Date/Time   NA 136 (A) 08/23/2021 0000   K 4.9 08/23/2021 0000   CL 100 08/23/2021 0000   CO2 29 (A) 08/23/2021 0000   GLUCOSE 127 (H) 05/06/2021 0649   BUN 11 08/23/2021 0000   CREATININE 0.8 08/23/2021 0000   CREATININE 0.88 05/06/2021 0649   CALCIUM 9.1 08/23/2021 0000   PROT 6 (A) 07/27/2021 0000   ALBUMIN 4.1 08/23/2021 0000   AST 30 08/23/2021 0000   ALT 18 08/23/2021 0000   ALKPHOS 110 08/23/2021 0000   BILITOT 1.2 05/05/2021 0951   GFRNONAA >60 05/06/2021 0649   GFRAA >60 10/11/2016 1100    No results found for: SPEP, UPEP  Lab Results  Component Value Date   WBC 8.6 08/23/2021   NEUTROABS 4.73 08/23/2021   HGB 14.5 08/23/2021   HCT 44 08/23/2021   MCV 86.7 05/06/2021   PLT 205 08/23/2021      Chemistry      Component Value Date/Time   NA 136 (A) 08/23/2021 0000   K 4.9 08/23/2021 0000   CL 100 08/23/2021  0000   CO2 29 (A) 08/23/2021 0000   BUN 11 08/23/2021 0000   CREATININE 0.8 08/23/2021 0000   CREATININE 0.88 05/06/2021 0649   GLU 102 08/23/2021 0000      Component Value Date/Time  CALCIUM 9.1 08/23/2021 0000   ALKPHOS 110 08/23/2021 0000   AST 30 08/23/2021 0000   ALT 18 08/23/2021 0000   BILITOT 1.2 05/05/2021 0951       RADIOGRAPHIC STUDIES: I have personally reviewed the radiological images as listed and agreed with the findings in the report. No results found.

## 2021-08-23 NOTE — Assessment & Plan Note (Signed)
Pancreatic adenocarcinoma, with a mass measuring 3.3 x 3.5 cm on MRI. She received her first dose of gemcitabine and was hospitalized a few days later with fever, poor nutrition and severe constipation. She was discharged after a couple of days and readmitted, again for severe constipation. Due to these hospitalizations, she missed day 8 of cycle 1. She is due for cycle 2, day 15 next week and is doing well today. She has some complaints about her feeding tube being uncomfortable at night when she sleeps, but is willing to leave it in for now. I will have her meet with our dietician to see if she has suggestions for managing the tube. She is doing well; however, she is having trouble sleeping. Her husband states that she often is awake all night. She is experiencing ongoing nausea. I will send in ativan and she will take this at night to hopefully help with her nausea as well as sleep.

## 2021-08-24 ENCOUNTER — Other Ambulatory Visit: Payer: Self-pay | Admitting: Hematology and Oncology

## 2021-08-24 MED ORDER — TRAMADOL HCL 50 MG PO TABS: 50.0000 mg | ORAL_TABLET | ORAL | 0 refills | Status: AC | PRN

## 2021-08-24 MED ORDER — LORAZEPAM 1 MG PO TABS
1.0000 mg | ORAL_TABLET | Freq: Three times a day (TID) | ORAL | 0 refills | Status: AC
Start: 2021-08-24 — End: ?

## 2021-08-24 MED ORDER — TRAMADOL HCL 50 MG PO TABS: 50.0000 mg | ORAL_TABLET | ORAL | 0 refills | Status: DC | PRN

## 2021-08-24 MED ORDER — LORAZEPAM 1 MG PO TABS: 1.0000 mg | ORAL_TABLET | Freq: Three times a day (TID) | ORAL | 0 refills | Status: DC

## 2021-08-29 ENCOUNTER — Encounter: Payer: Self-pay | Admitting: Oncology

## 2021-08-30 ENCOUNTER — Telehealth: Payer: Self-pay | Admitting: Oncology

## 2021-08-30 ENCOUNTER — Encounter: Payer: Self-pay | Admitting: Oncology

## 2021-08-30 MED FILL — Gemcitabine HCl Inj 1 GM/26.3ML (38 MG/ML) (Base Equiv): INTRAVENOUS | Qty: 29 | Status: AC

## 2021-08-30 NOTE — Telephone Encounter (Signed)
Contacted pt to schedule an appt. Unable to reach via phone, voicemail was left.   Scheduling Message Entered by Juanetta Beets on 08/30/2021 at 10:29 AM Priority: High <No visit type provided>  Department: [facility]  Provider:   Appointment Notes:  Will you call this patient and see if she can come at 12:30 or 1:00 on 6/7?  We are planning to give her 2 hours of potassium in addition to her chemo tomorrow.  Scheduling Notes:

## 2021-08-30 NOTE — Telephone Encounter (Signed)
08/30/21 LFT vm appt time changed to 1230pm on 08/31/21.

## 2021-08-31 ENCOUNTER — Inpatient Hospital Stay: Payer: Medicare Other | Attending: Oncology

## 2021-08-31 ENCOUNTER — Ambulatory Visit: Payer: Medicare Other

## 2021-08-31 VITALS — BP 114/60 | HR 98 | Temp 98.2°F | Resp 16 | Ht <= 58 in | Wt 99.8 lb

## 2021-08-31 DIAGNOSIS — K56609 Unspecified intestinal obstruction, unspecified as to partial versus complete obstruction: Secondary | ICD-10-CM

## 2021-08-31 DIAGNOSIS — Z5111 Encounter for antineoplastic chemotherapy: Secondary | ICD-10-CM | POA: Diagnosis not present

## 2021-08-31 DIAGNOSIS — C252 Malignant neoplasm of tail of pancreas: Secondary | ICD-10-CM

## 2021-08-31 MED ORDER — SODIUM CHLORIDE 0.9 % IV SOLN: Freq: Once | INTRAVENOUS | Status: AC

## 2021-08-31 MED ORDER — HEPARIN SOD (PORK) LOCK FLUSH 100 UNIT/ML IV SOLN
500.0000 [IU] | Freq: Once | INTRAVENOUS | Status: AC | PRN
  Administered 2021-08-31: 500 [IU]

## 2021-08-31 MED ORDER — POTASSIUM CHLORIDE 10 MEQ/100ML IV SOLN
10.0000 meq | INTRAVENOUS | Status: AC
  Administered 2021-08-31 (×2): 10 meq via INTRAVENOUS
  Filled 2021-08-31 (×2): qty 100

## 2021-08-31 MED ORDER — SODIUM CHLORIDE 0.9% FLUSH
10.0000 mL | INTRAVENOUS | Status: DC | PRN
  Administered 2021-08-31: 10 mL

## 2021-08-31 MED ORDER — SODIUM CHLORIDE 0.9 % IV SOLN: Freq: Once | INTRAVENOUS | Status: DC

## 2021-08-31 MED ORDER — SODIUM CHLORIDE 0.9 % IV SOLN
150.0000 mg | Freq: Once | INTRAVENOUS | Status: AC
  Administered 2021-08-31: 150 mg via INTRAVENOUS
  Filled 2021-08-31: qty 150

## 2021-08-31 MED ORDER — SODIUM CHLORIDE 0.9 % IV SOLN
800.0000 mg/m2 | Freq: Once | INTRAVENOUS | Status: AC
  Administered 2021-08-31: 1102 mg via INTRAVENOUS
  Filled 2021-08-31: qty 26.3

## 2021-08-31 MED ORDER — ONDANSETRON HCL 4 MG/2ML IJ SOLN
8.0000 mg | Freq: Once | INTRAMUSCULAR | Status: AC
  Administered 2021-08-31: 8 mg via INTRAVENOUS
  Filled 2021-08-31: qty 4

## 2021-08-31 NOTE — Patient Instructions (Signed)
[facility]  Discharge Instructions: Thank you for choosing [facility] to provide your oncology and hematology care.  If you have a lab appointment with the [facility], please go directly to the [facility] and check in at the registration area.   Wear comfortable clothing and clothing appropriate for easy access to any Portacath or PICC line.   We strive to give you quality time with your provider. You may need to reschedule your appointment if you arrive late (15 or more minutes).  Arriving late affects you and other patients whose appointments are after yours.  Also, if you miss three or more appointments without notifying the office, you may be dismissed from the clinic at the provider's discretion.      For prescription refill requests, have your pharmacy contact our office and allow 72 hours for refills to be completed.    Today you received the following chemotherapy and/or immunotherapy agents :Gemzar, PotassiumGemcitabine injection Qu es este medicamento? La GEMCITABINA es un agente quimioteraputico. Este medicamento se [place] para tratar muchos tipos de cncer, tales como cncer de mama, cncer de pulmn, cncer de pncreas y cncer de ovario. Este medicamento puede ser utilizado para otros usos; si tiene alguna pregunta consulte con su proveedor de atencin mdica o con su farmacutico. MARCAS COMUNES: Gemzar, Infugem Qu le debo informar a mi profesional de la salud antes de tomar este medicamento? Necesitan saber si usted presenta alguno de los siguientes problemas o situaciones: trastornos sanguneos infeccin enfermedad renal enfermedad heptica enfermedad pulmonar o respiratoria, como asma terapia de radiacin en curso o reciente una reaccin alrgica o inusual a la gemcitabina, a otros medicamentos quimioteraputicos, a otros medicamentos, alimentos, colorantes o conservantes si est embarazada o buscando quedar embarazada si  est amamantando a un beb Cmo debo utilizar este medicamento? Este medicamento se administra mediante infusin por va intravenosa. Lo administra un profesional de la salud calificado en un hospital o en un entorno clnico. Hable con su pediatra para informarse acerca del uso de este medicamento en nios. Puede requerir atencin especial. Sobredosis: Pngase en contacto inmediatamente con un centro toxicolgico o una sala de urgencia si usted cree que haya tomado demasiado medicamento. ATENCIN: [employer] es solo para usted. No comparta este medicamento con nadie. Qu sucede si me olvido de una dosis? Es importante no olvidar ninguna dosis. Informe a su mdico o a su profesional de la salud si no puede asistir a Photographer. Qu puede interactuar con este medicamento? medicamentos para incrementar los conteos sanguneos, tales como filgrastim, pegfilgrastim, sargramostim algunos otros agentes quimioteraputicos como cisplatino vacunas Consulte a su mdico o a su profesional de la salud antes de tomar cualquiera de los siguientes medicamentos: acetaminofeno aspirina ibuprofeno quetoprofeno naproxeno Puede ser que esta lista no menciona todas las posibles interacciones. Informe a su profesional de [employer]	[place] de [employer] productos a base de hierbas, medicamentos de [place] o suplementos nutritivos que est tomando. Si usted fuma, consume bebidas alcohlicas o si utiliza drogas ilegales, indqueselo tambin a su profesional de KB Home	[place]. Algunas sustancias pueden interactuar con su medicamento. A qu debo estar atento al usar Coca[employer]a? Visite a su mdico para que revise su evolucin. Este medicamento podra hacerle sentir un Nurse, mental health. Esto es normal, ya que la quimioterapia puede afectar tanto a las clulas sanas como a las clulas cancerosas. Si presenta algn efecto secundario, infrmelo. Contine con el tratamiento aun si se siente enfermo, a menos que su  mdico le  indique que lo suspenda. En algunos casos, podra recibir [employer] Brands para ayudarlo con los efectos secundarios. Siga todas las instrucciones para usarlos. Consulte a su mdico o a su profesional de la salud si tiene fiebre, escalofros o dolor de garganta, o cualquier otro sntoma de resfro o gripe. No se trate usted mismo. Este medicamento reduce la capacidad del cuerpo para combatir infecciones. Trate de no acercarse a personas que estn enfermas. Este medicamento podra aumentar el riesgo de moretones o sangrado. Consulte a su mdico o a su profesional de la salud si observa sangrados inusuales. Proceda con cuidado al cepillar sus dientes, usar hilo dental o Risk manager palillos para los dientes, ya que podra contraer una infeccin o Therapist, art con mayor facilidad. Si se somete a algn tratamiento dental, informe a su dentista que est [employer]. Evite usar productos que contienen aspirina, acetaminofeno, ibuprofeno, naproxeno o ketoprofeno, a menos que as lo indique su mdico. Estos productos pueden ocultar la fiebre. No debe quedar embarazada mientras est usando este medicamento o por 6 meses despus de dejar de usarlo. Las mujeres deben informar a su mdico si estn buscando quedar embarazadas o si creen que podran estar embarazadas. Los hombres no deben tener hijos mientras estn recibiendo Coca[employer]a y [place] 3 meses despus de dejar de usarlo. Existe la posibilidad de efectos secundarios graves en un beb sin nacer. Para obtener ms informacin, hable con su profesional de la salud o su farmacutico. No debe Economist a un beb mientras est tomando este medicamento o durante al menos 1 semana despus de dejar de usarlo. Los hombres deben informar a su mdico si quieren tener hijos. Este medicamento puede reducir el recuento de esperma. Hable con su mdico o su profesional de la salud si est preocupado por su fertilidad. Qu efectos secundarios puedo tener al  [employer] este medicamento? Efectos secundarios que debe informar a su mdico o a Barrister's clerk de la salud tan pronto como sea posible: Chief of Staff, como erupcin cutnea, comezn/picazn o urticaria, e hinchazn de la cara, los labios o la lengua problemas Theatre stage manager, enrojecimiento o Actor de la inyeccin signos y sntomas de un cambio peligroso en el pulso o ritmo cardiaco, tales como dolor en el pecho; mareos; ritmo cardiaco rpido o irregular; palpitaciones; sensacin de desmayo o aturdimiento, cadas; problemas respiratorios signos de disminucin en la cantidad de plaquetas o sangrado: moretones, puntos rojos en la piel, heces de color negro y aspecto alquitranado, sangre en la orina signos de disminucin en la cantidad de glbulos rojos: cansancio o debilidad inusual, sensacin de Secondary school teacher o aturdimiento, cadas signos de infeccin: fiebre o escalofros, tos, dolor de garganta, dolor o dificultad para orinar signos y sntomas de lesin al rin, tales como dificultad para orinar o cambios en la cantidad de orina signos y sntomas de lesin al hgado, como orina amarilla oscura o [place]; sensacin general de estar enfermo o sntomas gripales; heces claras; prdida del apetito; nuseas; dolor en la regin abdominal superior derecha; cansancio o debilidad inusual; color amarillento de los ojos o la piel hinchazn de tobillos, pies, manos Efectos secundarios que generalmente no requieren atencin mdica (infrmelos a su mdico o a su profesional de la salud si persisten o si son molestos): estreimiento diarrea cada del cabello prdida del apetito nuseas erupcin cutnea vmito Puede ser que esta lista no menciona todos los posibles efectos secundarios. Comunquese a su mdico por asesoramiento mdico [employer] Usted puede [employer]  secundarios a la FDA por telfono al 1-800-FDA-1088. Dnde debo guardar mi medicina? Este  medicamento se administra en hospitales o clnicas y no necesitar guardarlo en su domicilio. ATENCIN: Este folleto es un resumen. Puede ser que no cubra toda la posible informacin. Si usted tiene preguntas acerca de esta medicina, consulte con su mdico, su farmacutico o su profesional de Technical sales engineer.  2023 Elsevier/Gold Standard (2017-07-24 00:00:00) Hipopotasemia Hypokalemia Hipopotasemia significa que el nivel de potasio en sangre es menor que lo normal. El potasio es un mineral (electrolito) que ayuda a regular la cantidad de lquido en el organismo. Tambin estimula la tensin (contraccin) muscular y ayuda a que los nervios funcionen correctamente. Normalmente, la mayor parte del potasio del organismo se encuentra dentro de las clulas y solo una cantidad muy pequea est en la sangre. Debido a que la cantidad en la sangre es tan pequea, un cambio pequeo en los niveles de potasio en la sangre puede ser potencialmente mortal. Cules son las causas? Esta afeccin puede ser causada por lo siguiente: Antibiticos. Diarrea o vmitos. Tomar demasiada cantidad de un medicamento que lo ayuda a Best boy deposiciones (laxante) puede causar diarrea y derivar en hipopotasemia. Enfermedad renal crnica (ERC). Medicamentos que ayudan al cuerpo a eliminar el exceso de lquido (diurticos). Trastornos de Youth worker, como anorexia o bulimia. Niveles bajos de [employer]. Sudoracin abundante. Cules son los signos o sntomas? Los sntomas de esta afeccin incluyen: Debilidad. Estreimiento. Fatiga. Calambres musculares. Confusin mental. Latidos cardacos salteados o irregulares (palpitaciones). Hormigueo o entumecimiento. Cmo se diagnostica? Esta afeccin se diagnostica con un anlisis de sangre. Cmo se trata? El tratamiento para esta afeccin puede incluir lo siguiente: Consumo de suplementos de potasio. Ajuste de los medicamentos que toma. Consumo de ms alimentos que  contengan una gran cantidad de potasio. Si sus niveles de potasio son muy bajos, posiblemente sea necesario que reciba potasio a travs de una va intravenosa y se lo controle en el hospital. Siga estas indicaciones en su casa: Comida y bebida  Siga una dieta saludable. Una dieta saludable incluye frutas y verduras frescas, cereales integrales, grasas saludables y protenas magras. Si se lo indican, coma ms alimentos que contengan una gran cantidad de potasio. Estos incluyen: Frutos secos, como cacahuetes y pistachos. Semillas, como semillas de girasol y de Scientific laboratory technician. Porotos, guisantes secos y lentejas. Granos enteros y panes y cereales con salvado. [place] y verduras frescas, como damascos, palta, bananas, meln, kiwi, naranjas, tomates, esprragos y papas. Jugos, como naranja, tomate y ciruelas. Carnes magras, incluido el pescado. Leche y productos lcteos, Warehouse manager. Indicaciones generales Use los medicamentos de venta libre y los recetados solamente como se lo haya indicado el mdico. Esto incluye vitaminas, productos alimenticios naturales y suplementos. Concurra a [place]. Esto es importante. Comunquese con un mdico si: Tiene debilidad que empeora. Siente que el corazn late fuerte o est acelerado. Vomita. Tiene diarrea. Tiene diabetes y tiene problemas para Advertising account executive de azcar en la sangre dentro del rango indicado. Solicite ayuda de inmediato si: Electronics engineer. Le falta el aire. Tiene vmitos o diarrea que duran ms de 2 das. Se desmaya. Estos sntomas pueden Sales executive. Solicite ayuda de inmediato. Llame al 911. No espere a ver si los sntomas desaparecen. No conduzca por sus propios medios [employer] hospital. Resumen Hipopotasemia significa que el nivel de potasio en sangre es menor que lo normal. Esta afeccin se diagnostica con un anlisis de [place]. La hipopotasemia  puede tratarse tomando suplementos de  potasio, ajustando los [employer] toma o comiendo ms alimentos con alto contenido de Field seismologist. Si sus niveles de potasio son muy bajos, posiblemente sea necesario que reciba potasio a travs de una va intravenosa y se lo controle en el hospital. Esta informacin no tiene Marine scientist el consejo del mdico. Asegrese de hacerle al mdico cualquier pregunta que tenga. Document Revised: 12/20/2020 Document Reviewed: 12/20/2020 Elsevier Patient Education  [place].       To help prevent nausea and vomiting after your treatment, we encourage you to take your nausea medication as directed.  BELOW ARE SYMPTOMS THAT SHOULD BE REPORTED IMMEDIATELY: *FEVER GREATER THAN 100.4 F (38 C) OR HIGHER *CHILLS OR SWEATING *NAUSEA AND VOMITING THAT IS NOT CONTROLLED WITH YOUR NAUSEA MEDICATION *UNUSUAL SHORTNESS OF BREATH *UNUSUAL BRUISING OR BLEEDING *URINARY PROBLEMS (pain or burning when urinating, or frequent urination) *BOWEL PROBLEMS (unusual diarrhea, constipation, pain near the anus) TENDERNESS IN MOUTH AND THROAT WITH OR WITHOUT PRESENCE OF ULCERS (sore throat, sores in mouth, or a toothache) UNUSUAL RASH, SWELLING OR PAIN  UNUSUAL VAGINAL DISCHARGE OR ITCHING   Items with * indicate a potential emergency and should be followed up as soon as possible or go to the Emergency Department if any problems should occur.  Please show the CHEMOTHERAPY ALERT CARD or IMMUNOTHERAPY ALERT CARD at check-in to the Emergency Department and triage nurse.  Should you have questions after your visit or need to cancel or reschedule your appointment, please contact [facility]  Dept: [phone]  and follow the prompts.  Office hours are 8:00 a.m. to 4:30 p.m. Monday - Friday. Please note that voicemails left after 4:00 p.m. may not be returned until the following business day.  We are closed weekends and major holidays. You have access to a nurse at all times for urgent  questions. Please call the main number to the clinic Dept: [phone] and follow the prompts.  For any non-urgent questions, you may also contact your provider using MyChart. We now offer e-Visits for anyone 100 and older to request care online for non-urgent symptoms. For details visit [email or web address].   Also download the MyChart app! Go to the app store, search "MyChart", open the app, select [facility], and log in with your MyChart username and password.  Due to Covid, a mask is required upon entering the hospital/clinic. If you do not have a mask, one will be given to you upon arrival. For doctor visits, patients may have 1 support person aged 66 or older with them. For treatment visits, patients cannot have anyone with them due to current Covid guidelines and our immunocompromised population.

## 2021-09-04 DIAGNOSIS — C252 Malignant neoplasm of tail of pancreas: Secondary | ICD-10-CM | POA: Diagnosis not present

## 2021-09-04 DIAGNOSIS — I21A1 Myocardial infarction type 2: Secondary | ICD-10-CM | POA: Diagnosis not present

## 2021-09-04 DIAGNOSIS — E43 Unspecified severe protein-calorie malnutrition: Secondary | ICD-10-CM | POA: Diagnosis not present

## 2021-09-04 DIAGNOSIS — R1110 Vomiting, unspecified: Secondary | ICD-10-CM | POA: Diagnosis not present

## 2021-09-04 DIAGNOSIS — K567 Ileus, unspecified: Secondary | ICD-10-CM | POA: Diagnosis not present

## 2021-09-04 DIAGNOSIS — N131 Hydronephrosis with ureteral stricture, not elsewhere classified: Secondary | ICD-10-CM | POA: Diagnosis not present

## 2021-09-04 DIAGNOSIS — G8929 Other chronic pain: Secondary | ICD-10-CM | POA: Diagnosis not present

## 2021-09-04 DIAGNOSIS — E871 Hypo-osmolality and hyponatremia: Secondary | ICD-10-CM | POA: Diagnosis not present

## 2021-09-04 DIAGNOSIS — C259 Malignant neoplasm of pancreas, unspecified: Secondary | ICD-10-CM | POA: Diagnosis not present

## 2021-09-04 DIAGNOSIS — E860 Dehydration: Secondary | ICD-10-CM | POA: Diagnosis not present

## 2021-09-04 DIAGNOSIS — Z931 Gastrostomy status: Secondary | ICD-10-CM | POA: Diagnosis not present

## 2021-09-04 DIAGNOSIS — I361 Nonrheumatic tricuspid (valve) insufficiency: Secondary | ICD-10-CM | POA: Diagnosis not present

## 2021-09-04 DIAGNOSIS — K56609 Unspecified intestinal obstruction, unspecified as to partial versus complete obstruction: Secondary | ICD-10-CM | POA: Diagnosis not present

## 2021-09-04 DIAGNOSIS — Z681 Body mass index (BMI) 19 or less, adult: Secondary | ICD-10-CM | POA: Diagnosis not present

## 2021-09-04 DIAGNOSIS — E872 Acidosis, unspecified: Secondary | ICD-10-CM | POA: Diagnosis not present

## 2021-09-04 DIAGNOSIS — Z880 Allergy status to penicillin: Secondary | ICD-10-CM | POA: Diagnosis not present

## 2021-09-04 DIAGNOSIS — D649 Anemia, unspecified: Secondary | ICD-10-CM | POA: Diagnosis not present

## 2021-09-04 DIAGNOSIS — R109 Unspecified abdominal pain: Secondary | ICD-10-CM | POA: Diagnosis not present

## 2021-09-04 DIAGNOSIS — R000 Tachycardia, unspecified: Secondary | ICD-10-CM | POA: Diagnosis not present

## 2021-09-04 DIAGNOSIS — F32A Depression, unspecified: Secondary | ICD-10-CM | POA: Diagnosis not present

## 2021-09-04 DIAGNOSIS — Z7960 Long term (current) use of unspecified immunomodulators and immunosuppressants: Secondary | ICD-10-CM | POA: Diagnosis not present

## 2021-09-04 DIAGNOSIS — D6481 Anemia due to antineoplastic chemotherapy: Secondary | ICD-10-CM | POA: Diagnosis not present

## 2021-09-04 DIAGNOSIS — Z79899 Other long term (current) drug therapy: Secondary | ICD-10-CM | POA: Diagnosis not present

## 2021-09-04 DIAGNOSIS — Z8507 Personal history of malignant neoplasm of pancreas: Secondary | ICD-10-CM | POA: Diagnosis not present

## 2021-09-04 DIAGNOSIS — Z792 Long term (current) use of antibiotics: Secondary | ICD-10-CM | POA: Diagnosis not present

## 2021-09-04 DIAGNOSIS — Z79891 Long term (current) use of opiate analgesic: Secondary | ICD-10-CM | POA: Diagnosis not present

## 2021-09-04 DIAGNOSIS — N1330 Unspecified hydronephrosis: Secondary | ICD-10-CM | POA: Diagnosis not present

## 2021-09-04 DIAGNOSIS — E876 Hypokalemia: Secondary | ICD-10-CM | POA: Diagnosis not present

## 2021-09-04 DIAGNOSIS — R112 Nausea with vomiting, unspecified: Secondary | ICD-10-CM | POA: Diagnosis not present

## 2021-09-04 DIAGNOSIS — I340 Nonrheumatic mitral (valve) insufficiency: Secondary | ICD-10-CM | POA: Diagnosis not present

## 2021-09-04 DIAGNOSIS — K219 Gastro-esophageal reflux disease without esophagitis: Secondary | ICD-10-CM | POA: Diagnosis not present

## 2021-09-04 DIAGNOSIS — R778 Other specified abnormalities of plasma proteins: Secondary | ICD-10-CM | POA: Diagnosis not present

## 2021-09-04 DIAGNOSIS — I10 Essential (primary) hypertension: Secondary | ICD-10-CM | POA: Diagnosis not present

## 2021-09-05 ENCOUNTER — Other Ambulatory Visit: Payer: Self-pay | Admitting: Oncology

## 2021-09-05 DIAGNOSIS — I340 Nonrheumatic mitral (valve) insufficiency: Secondary | ICD-10-CM

## 2021-09-05 DIAGNOSIS — R778 Other specified abnormalities of plasma proteins: Secondary | ICD-10-CM

## 2021-09-05 DIAGNOSIS — R000 Tachycardia, unspecified: Secondary | ICD-10-CM

## 2021-09-05 DIAGNOSIS — E860 Dehydration: Secondary | ICD-10-CM

## 2021-09-05 DIAGNOSIS — I361 Nonrheumatic tricuspid (valve) insufficiency: Secondary | ICD-10-CM

## 2021-09-05 DIAGNOSIS — C252 Malignant neoplasm of tail of pancreas: Secondary | ICD-10-CM

## 2021-09-05 DIAGNOSIS — C259 Malignant neoplasm of pancreas, unspecified: Secondary | ICD-10-CM

## 2021-09-05 DIAGNOSIS — D649 Anemia, unspecified: Secondary | ICD-10-CM

## 2021-09-06 DIAGNOSIS — R778 Other specified abnormalities of plasma proteins: Secondary | ICD-10-CM | POA: Diagnosis not present

## 2021-09-06 DIAGNOSIS — D649 Anemia, unspecified: Secondary | ICD-10-CM | POA: Diagnosis not present

## 2021-09-06 DIAGNOSIS — E860 Dehydration: Secondary | ICD-10-CM | POA: Diagnosis not present

## 2021-09-06 DIAGNOSIS — C259 Malignant neoplasm of pancreas, unspecified: Secondary | ICD-10-CM | POA: Diagnosis not present

## 2021-09-07 ENCOUNTER — Other Ambulatory Visit: Payer: Medicare Other

## 2021-09-07 ENCOUNTER — Ambulatory Visit: Payer: Medicare Other | Admitting: Oncology

## 2021-09-07 NOTE — Progress Notes (Deleted)
Patient Care Team: Street, [name], MD as PCP - General (Family Medicine) Daryel November, MD as Consulting Physician (Gastroenterology) Melodye Ped, NP as Nurse Practitioner (Hematology and Oncology) Dwan Bolt, MD as Consulting Physician (General Surgery) Derwood Kaplan, MD as Consulting Physician (Oncology)  Clinic Day:  09/07/2021  Referring physician: Emmaline Kluver, *  ASSESSMENT & PLAN:   Assessment & Plan: Pancreatic cancer [facility]) Pancreatic adenocarcinoma, with a mass measuring 3.3 x 3.5 cm on MRI. She received her first dose of gemcitabine and was hospitalized a few days later with fever, poor nutrition and severe constipation. She was discharged after a couple of days and readmitted, again for severe constipation. Due to these hospitalizations, she missed day 8 of cycle 1. She is due for cycle 2, day 15 next week and is doing well today. She has some complaints about her feeding tube being uncomfortable at night when she sleeps, but is willing to leave it in for now. I will have her meet with our dietician to see if she has suggestions for managing the tube. She is doing well; however, she is having trouble sleeping. Her husband states that she often is awake all night. She is experiencing ongoing nausea. I will send in ativan and she will take this at night to hopefully help with her nausea as well as sleep.        The patient understands the plans discussed today and is in agreement with them.  She knows to contact our office if she develops concerns prior to her next appointment.    Derwood Kaplan, MD  [facility] [address] [place] [address] Dept: [phone] Dept Fax: [phone]   No orders of the defined types were placed in this encounter.     CHIEF COMPLAINT:  CC: A 76 year old female with history of pancreatic cancer  Current Treatment:   Gemcitabine  INTERVAL HISTORY:  Leah Pruitt is here today for repeat clinical assessment. She denies fevers or chills. She denies pain. Her appetite is good. Her weight has decreased 2  pounds over last week .  I have reviewed the past medical history, past surgical history, social history and family history with the patient and they are unchanged from previous note.  ALLERGIES:  is allergic to iodinated contrast media, naratriptan, penicillins, iodine, ciprofloxacin, duragesic-100 [fentanyl], and penicillin g.  MEDICATIONS:  Current Outpatient Medications  Medication Sig Dispense Refill   ACCU-CHEK GUIDE test strip 2 (two) times daily.     Acetaminophen 500 MG capsule Take by mouth.     bisacodyl (DULCOLAX) 10 MG suppository Place rectally.     Calcium Carbonate-Vitamin D 600-200 MG-UNIT TABS Take 1 tablet by mouth 2 (two) times daily with a meal.     CAMPHOR EX Apply 1 application topically as needed (pain).     Carboxymethylcellulose Sodium (DRY EYE RELIEF OP) Apply 1 drop to eye as needed (Dryness).     Cholecalciferol 25 MCG (1000 UT) tablet Take by mouth.     dicyclomine (BENTYL) 20 MG tablet TAKE 1/2 TO 1 TABLET BY MOUTH UP TO FOUR TIMES DAILY AS NEEDED FOR ABDOMINAL CRAMPS     diltiazem (TIAZAC) 180 MG 24 hr capsule Take by mouth.     Docusate Sodium (DSS) 100 MG CAPS Take 1 capsule by mouth daily.     FEROSUL 325 (65 Fe) MG tablet Take 325 mg by mouth daily.  FLUoxetine (PROZAC) 20 MG capsule Take 1 capsule (20 mg total) by mouth daily. 30 capsule 3   gabapentin (NEURONTIN) 300 MG capsule Neurontin 300 mg capsule (Patient not taking: Reported on 06/13/2021)     GLUMETZA 500 MG 24 hr tablet Take by mouth.     Hyoscyamine Sulfate SL 0.125 MG SUBL DISSOLVE 1 TO 2 TABLETS UNDER THE TONGUE EVERY 6 TO 8 HOURS AS NEEDED FOR PAIN     ibuprofen (ADVIL) 800 MG tablet Take 800 mg by mouth 3 (three) times daily.     lactulose (CONSTULOSE) 10 GM/15ML solution Take 15 mLs (10 g total) by mouth 3  (three) times daily. 237 mL 0   lidocaine-prilocaine (EMLA) cream Apply to affected area once 30 g 3   LORazepam (ATIVAN) 1 MG tablet Take 1 tablet (1 mg total) by mouth every 8 (eight) hours. 30 tablet 0   magnesium hydroxide (MILK OF MAGNESIA) 800 MG/5ML suspension Take 30 mLs by mouth daily.     meclizine (ANTIVERT) 25 MG tablet Take 25 mg by mouth daily as needed for dizziness.     metFORMIN (GLUCOPHAGE) 500 MG tablet Take by mouth.     metroNIDAZOLE (FLAGYL) 500 MG tablet Take 500 mg by mouth 3 (three) times daily.     MOVANTIK 12.5 MG TABS tablet Take 12.5 mg by mouth daily as needed (constipation).     nitroGLYCERIN (NITROSTAT) 0.4 MG SL tablet Place 0.4 mg under the tongue every 5 (five) minutes as needed for chest pain.     olmesartan (BENICAR) 20 MG tablet Take 20 mg by mouth 2 (two) times daily.     ondansetron (ZOFRAN) 4 MG tablet Take 1 tablet (4 mg total) by mouth every 4 (four) hours as needed for nausea. 90 tablet 3   oxyCODONE (OXY IR/ROXICODONE) 5 MG immediate release tablet Take 5-10 mg by mouth every 8 (eight) hours as needed.     prochlorperazine (COMPAZINE) 10 MG tablet Take 1 tablet (10 mg total) by mouth every 6 (six) hours as needed (Nausea or vomiting). 30 tablet 1   RABEprazole (ACIPHEX) 20 MG tablet Take 20 mg by mouth 2 (two) times daily.     STIMULANT LAXATIVE 8.6-50 MG tablet Take 2 tablets by mouth 2 (two) times daily.     sucralfate (CARAFATE) 1 GM/10ML suspension Take 1 g by mouth 2 (two) times daily.     tiZANidine (ZANAFLEX) 4 MG capsule Take by mouth.     traMADol (ULTRAM) 50 MG tablet Take 1 tablet (50 mg total) by mouth every 4 (four) hours as needed. 90 tablet 0   No current facility-administered medications for this visit.   Facility-Administered Medications Ordered in Other Visits  Medication Dose Route Frequency Provider Last Rate Last Admin   sodium chloride flush (NS) 0.9 % injection 10 mL  10 mL Intracatheter PRN [place] Scrape A, NP   10 mL at  06/30/21 1340    HISTORY OF PRESENT ILLNESS:   Oncology History  Pancreatic cancer ([place])  04/08/2021 Cancer Staging   Staging form: Exocrine Pancreas, AJCC 8th Edition - Clinical stage from 04/08/2021: Stage IB (cT2, cN0, cM0) - Signed by Derwood Kaplan, MD on 06/10/2021 Histopathologic type: Mucinous adenocarcinoma Stage prefix: Initial diagnosis Total positive nodes: 0 Histologic grade (G): GX Histologic grading system: 3 grade system Tumor size (mm): 35 Diagnostic confirmation: Positive histology PLUS positive immunophenotyping and/or positive genetic studies Specimen type: Endoscopy with Biopsy Staged by: Managing physician Stage used in treatment planning: Yes  National guidelines used in treatment planning: Yes Type of national guideline used in treatment planning: NCCN   04/28/2021 Initial Diagnosis   Pancreatic cancer ([place])   06/27/2021 -  Chemotherapy   Patient is on Treatment Plan : PANCREAS Gemcitabine D1 & D15 ONLY q28d x 4 Cycles (day 8 deleted 07/13/21)         REVIEW OF SYSTEMS:   Constitutional: Denies fevers, chills or abnormal weight loss Eyes: Denies blurriness of vision Ears, nose, mouth, throat, and face: Denies mucositis or sore throat Respiratory: Denies cough, dyspnea or wheezes Cardiovascular: Denies palpitation, chest discomfort or lower extremity swelling Gastrointestinal:  Denies nausea, heartburn or change in bowel habits Skin: Denies abnormal skin rashes Lymphatics: Denies new lymphadenopathy or easy bruising Neurological:Denies numbness, tingling or new weaknesses Behavioral/Psych: Mood is stable, no new changes  All other systems were reviewed with the patient and are negative.   VITALS:  There were no vitals taken for this visit.  Wt Readings from Last 3 Encounters:  08/31/21 99 lb 12.8 oz (45.3 kg)  08/23/21 100 lb 14.4 oz (45.8 kg)  08/17/21 102 lb (46.3 kg)    There is no height or weight on file to calculate BMI.  Performance  status (ECOG): 1 - Symptomatic but completely ambulatory  PHYSICAL EXAM:   GENERAL:alert, no distress and comfortable SKIN: skin color, texture, turgor are normal, no rashes or significant lesions EYES: normal, Conjunctiva are pink and non-injected, sclera clear OROPHARYNX:no exudate, no erythema and lips, buccal mucosa, and tongue normal  NECK: supple, thyroid normal size, non-tender, without nodularity LYMPH:  no palpable lymphadenopathy in the cervical, axillary or inguinal LUNGS: clear to auscultation and percussion with normal breathing effort HEART: regular rate & rhythm and no murmurs and no lower extremity edema ABDOMEN:abdomen soft, non-tender and normal bowel sounds Musculoskeletal:no cyanosis of digits and no clubbing  NEURO: alert & oriented x 3 with fluent speech, no focal motor/sensory deficits  LABORATORY DATA:  I have reviewed the data as listed    Component Value Date/Time   NA 136 (A) 08/23/2021 0000   K 4.9 08/23/2021 0000   CL 100 08/23/2021 0000   CO2 29 (A) 08/23/2021 0000   GLUCOSE 127 (H) 05/06/2021 0649   BUN 11 08/23/2021 0000   CREATININE 0.8 08/23/2021 0000   CREATININE 0.88 05/06/2021 0649   CALCIUM 9.1 08/23/2021 0000   PROT 6 (A) 07/27/2021 0000   ALBUMIN 4.1 08/23/2021 0000   AST 30 08/23/2021 0000   ALT 18 08/23/2021 0000   ALKPHOS 110 08/23/2021 0000   BILITOT 1.2 05/05/2021 0951   GFRNONAA >60 05/06/2021 0649   GFRAA >60 10/11/2016 1100    No results found for: "SPEP", "UPEP"  Lab Results  Component Value Date   WBC 8.6 08/23/2021   NEUTROABS 4.73 08/23/2021   HGB 14.5 08/23/2021   HCT 44 08/23/2021   MCV 86.7 05/06/2021   PLT 205 08/23/2021      Chemistry      Component Value Date/Time   NA 136 (A) 08/23/2021 0000   K 4.9 08/23/2021 0000   CL 100 08/23/2021 0000   CO2 29 (A) 08/23/2021 0000   BUN 11 08/23/2021 0000   CREATININE 0.8 08/23/2021 0000   CREATININE 0.88 05/06/2021 0649   GLU 102 08/23/2021 0000       Component Value Date/Time   CALCIUM 9.1 08/23/2021 0000   ALKPHOS 110 08/23/2021 0000   AST 30 08/23/2021 0000   ALT 18 08/23/2021 0000  BILITOT 1.2 05/05/2021 0951       RADIOGRAPHIC STUDIES: I have personally reviewed the radiological images as listed and agreed with the findings in the report. No results found.

## 2021-09-08 ENCOUNTER — Encounter: Payer: Self-pay | Admitting: Genetic Counselor

## 2021-09-08 DIAGNOSIS — Z1379 Encounter for other screening for genetic and chromosomal anomalies: Secondary | ICD-10-CM | POA: Insufficient documentation

## 2021-09-11 ENCOUNTER — Other Ambulatory Visit: Payer: Self-pay | Admitting: Oncology

## 2021-09-11 DIAGNOSIS — K56609 Unspecified intestinal obstruction, unspecified as to partial versus complete obstruction: Secondary | ICD-10-CM | POA: Diagnosis not present

## 2021-09-11 DIAGNOSIS — E871 Hypo-osmolality and hyponatremia: Secondary | ICD-10-CM | POA: Diagnosis not present

## 2021-09-11 DIAGNOSIS — E860 Dehydration: Secondary | ICD-10-CM | POA: Diagnosis not present

## 2021-09-11 DIAGNOSIS — K6389 Other specified diseases of intestine: Secondary | ICD-10-CM | POA: Diagnosis not present

## 2021-09-11 DIAGNOSIS — R519 Headache, unspecified: Secondary | ICD-10-CM | POA: Diagnosis not present

## 2021-09-11 DIAGNOSIS — I700 Atherosclerosis of aorta: Secondary | ICD-10-CM | POA: Diagnosis not present

## 2021-09-11 DIAGNOSIS — E876 Hypokalemia: Secondary | ICD-10-CM | POA: Diagnosis not present

## 2021-09-11 DIAGNOSIS — K8689 Other specified diseases of pancreas: Secondary | ICD-10-CM | POA: Diagnosis not present

## 2021-09-11 DIAGNOSIS — N1330 Unspecified hydronephrosis: Secondary | ICD-10-CM | POA: Diagnosis not present

## 2021-09-12 DIAGNOSIS — K56609 Unspecified intestinal obstruction, unspecified as to partial versus complete obstruction: Secondary | ICD-10-CM | POA: Diagnosis not present

## 2021-09-12 DIAGNOSIS — C259 Malignant neoplasm of pancreas, unspecified: Secondary | ICD-10-CM | POA: Diagnosis not present

## 2021-09-12 DIAGNOSIS — Z681 Body mass index (BMI) 19 or less, adult: Secondary | ICD-10-CM | POA: Diagnosis not present

## 2021-09-12 DIAGNOSIS — K8689 Other specified diseases of pancreas: Secondary | ICD-10-CM | POA: Diagnosis not present

## 2021-09-12 DIAGNOSIS — C787 Secondary malignant neoplasm of liver and intrahepatic bile duct: Secondary | ICD-10-CM | POA: Diagnosis not present

## 2021-09-12 DIAGNOSIS — K5651 Intestinal adhesions [bands], with partial obstruction: Secondary | ICD-10-CM | POA: Diagnosis not present

## 2021-09-12 DIAGNOSIS — C772 Secondary and unspecified malignant neoplasm of intra-abdominal lymph nodes: Secondary | ICD-10-CM | POA: Diagnosis present

## 2021-09-12 DIAGNOSIS — E876 Hypokalemia: Secondary | ICD-10-CM | POA: Diagnosis not present

## 2021-09-12 DIAGNOSIS — K219 Gastro-esophageal reflux disease without esophagitis: Secondary | ICD-10-CM | POA: Diagnosis present

## 2021-09-12 DIAGNOSIS — E860 Dehydration: Secondary | ICD-10-CM | POA: Diagnosis not present

## 2021-09-12 DIAGNOSIS — R109 Unspecified abdominal pain: Secondary | ICD-10-CM | POA: Diagnosis not present

## 2021-09-12 DIAGNOSIS — K6389 Other specified diseases of intestine: Secondary | ICD-10-CM | POA: Diagnosis not present

## 2021-09-12 DIAGNOSIS — C786 Secondary malignant neoplasm of retroperitoneum and peritoneum: Secondary | ICD-10-CM | POA: Diagnosis not present

## 2021-09-12 DIAGNOSIS — R519 Headache, unspecified: Secondary | ICD-10-CM | POA: Diagnosis not present

## 2021-09-12 DIAGNOSIS — I10 Essential (primary) hypertension: Secondary | ICD-10-CM | POA: Diagnosis not present

## 2021-09-12 DIAGNOSIS — Z931 Gastrostomy status: Secondary | ICD-10-CM | POA: Diagnosis not present

## 2021-09-12 DIAGNOSIS — Z79891 Long term (current) use of opiate analgesic: Secondary | ICD-10-CM | POA: Diagnosis not present

## 2021-09-12 DIAGNOSIS — I480 Paroxysmal atrial fibrillation: Secondary | ICD-10-CM | POA: Diagnosis not present

## 2021-09-12 DIAGNOSIS — N179 Acute kidney failure, unspecified: Secondary | ICD-10-CM | POA: Diagnosis not present

## 2021-09-12 DIAGNOSIS — I700 Atherosclerosis of aorta: Secondary | ICD-10-CM | POA: Diagnosis not present

## 2021-09-12 DIAGNOSIS — E871 Hypo-osmolality and hyponatremia: Secondary | ICD-10-CM | POA: Diagnosis not present

## 2021-09-12 DIAGNOSIS — C252 Malignant neoplasm of tail of pancreas: Secondary | ICD-10-CM | POA: Diagnosis not present

## 2021-09-12 DIAGNOSIS — E43 Unspecified severe protein-calorie malnutrition: Secondary | ICD-10-CM | POA: Diagnosis not present

## 2021-09-12 DIAGNOSIS — N1330 Unspecified hydronephrosis: Secondary | ICD-10-CM | POA: Diagnosis not present

## 2021-09-12 DIAGNOSIS — D649 Anemia, unspecified: Secondary | ICD-10-CM | POA: Diagnosis not present

## 2021-09-12 DIAGNOSIS — Z880 Allergy status to penicillin: Secondary | ICD-10-CM | POA: Diagnosis not present

## 2021-09-12 DIAGNOSIS — E119 Type 2 diabetes mellitus without complications: Secondary | ICD-10-CM | POA: Diagnosis not present

## 2021-09-12 DIAGNOSIS — N83202 Unspecified ovarian cyst, left side: Secondary | ICD-10-CM | POA: Diagnosis not present

## 2021-09-12 DIAGNOSIS — F32A Depression, unspecified: Secondary | ICD-10-CM | POA: Diagnosis not present

## 2021-09-12 DIAGNOSIS — G893 Neoplasm related pain (acute) (chronic): Secondary | ICD-10-CM | POA: Diagnosis not present

## 2021-09-12 DIAGNOSIS — Z87440 Personal history of urinary (tract) infections: Secondary | ICD-10-CM | POA: Diagnosis not present

## 2021-09-12 DIAGNOSIS — M1990 Unspecified osteoarthritis, unspecified site: Secondary | ICD-10-CM | POA: Diagnosis not present

## 2021-09-13 DIAGNOSIS — C259 Malignant neoplasm of pancreas, unspecified: Secondary | ICD-10-CM | POA: Insufficient documentation

## 2021-09-14 ENCOUNTER — Ambulatory Visit: Payer: Medicare Other

## 2021-09-15 ENCOUNTER — Other Ambulatory Visit: Payer: Self-pay | Admitting: Hematology and Oncology

## 2021-09-15 ENCOUNTER — Telehealth: Payer: Self-pay

## 2021-09-15 MED ORDER — HYDROMORPHONE HCL 4 MG PO TABS: 4.0000 mg | ORAL_TABLET | ORAL | 0 refills | Status: AC | PRN

## 2021-09-16 ENCOUNTER — Telehealth: Payer: Self-pay

## 2021-09-16 ENCOUNTER — Telehealth: Payer: Self-pay | Admitting: Genetic Counselor

## 2021-09-17 DIAGNOSIS — E119 Type 2 diabetes mellitus without complications: Secondary | ICD-10-CM | POA: Diagnosis not present

## 2021-09-17 DIAGNOSIS — R112 Nausea with vomiting, unspecified: Secondary | ICD-10-CM | POA: Diagnosis not present

## 2021-09-17 DIAGNOSIS — R1110 Vomiting, unspecified: Secondary | ICD-10-CM | POA: Diagnosis not present

## 2021-09-17 DIAGNOSIS — K9423 Gastrostomy malfunction: Secondary | ICD-10-CM | POA: Diagnosis not present

## 2021-09-17 DIAGNOSIS — R110 Nausea: Secondary | ICD-10-CM | POA: Diagnosis not present

## 2021-09-17 DIAGNOSIS — Z66 Do not resuscitate: Secondary | ICD-10-CM | POA: Diagnosis not present

## 2021-09-17 DIAGNOSIS — N200 Calculus of kidney: Secondary | ICD-10-CM | POA: Diagnosis not present

## 2021-09-17 DIAGNOSIS — K56609 Unspecified intestinal obstruction, unspecified as to partial versus complete obstruction: Secondary | ICD-10-CM | POA: Diagnosis not present

## 2021-09-17 DIAGNOSIS — T85898A Other specified complication of other internal prosthetic devices, implants and grafts, initial encounter: Secondary | ICD-10-CM | POA: Diagnosis not present

## 2021-09-17 DIAGNOSIS — D72829 Elevated white blood cell count, unspecified: Secondary | ICD-10-CM | POA: Diagnosis not present

## 2021-09-17 DIAGNOSIS — C772 Secondary and unspecified malignant neoplasm of intra-abdominal lymph nodes: Secondary | ICD-10-CM | POA: Diagnosis not present

## 2021-09-17 DIAGNOSIS — E1165 Type 2 diabetes mellitus with hyperglycemia: Secondary | ICD-10-CM | POA: Diagnosis present

## 2021-09-17 DIAGNOSIS — R636 Underweight: Secondary | ICD-10-CM | POA: Diagnosis present

## 2021-09-17 DIAGNOSIS — R531 Weakness: Secondary | ICD-10-CM | POA: Diagnosis not present

## 2021-09-17 DIAGNOSIS — Z79891 Long term (current) use of opiate analgesic: Secondary | ICD-10-CM | POA: Diagnosis not present

## 2021-09-17 DIAGNOSIS — N261 Atrophy of kidney (terminal): Secondary | ICD-10-CM | POA: Diagnosis not present

## 2021-09-17 DIAGNOSIS — K219 Gastro-esophageal reflux disease without esophagitis: Secondary | ICD-10-CM | POA: Diagnosis present

## 2021-09-17 DIAGNOSIS — E872 Acidosis, unspecified: Secondary | ICD-10-CM | POA: Diagnosis present

## 2021-09-17 DIAGNOSIS — Z8639 Personal history of other endocrine, nutritional and metabolic disease: Secondary | ICD-10-CM | POA: Diagnosis not present

## 2021-09-17 DIAGNOSIS — Z7984 Long term (current) use of oral hypoglycemic drugs: Secondary | ICD-10-CM | POA: Diagnosis not present

## 2021-09-17 DIAGNOSIS — Z931 Gastrostomy status: Secondary | ICD-10-CM | POA: Diagnosis not present

## 2021-09-17 DIAGNOSIS — C786 Secondary malignant neoplasm of retroperitoneum and peritoneum: Secondary | ICD-10-CM | POA: Diagnosis not present

## 2021-09-17 DIAGNOSIS — E43 Unspecified severe protein-calorie malnutrition: Secondary | ICD-10-CM | POA: Diagnosis not present

## 2021-09-17 DIAGNOSIS — Z681 Body mass index (BMI) 19 or less, adult: Secondary | ICD-10-CM | POA: Diagnosis not present

## 2021-09-17 DIAGNOSIS — K311 Adult hypertrophic pyloric stenosis: Secondary | ICD-10-CM | POA: Diagnosis not present

## 2021-09-17 DIAGNOSIS — R638 Other symptoms and signs concerning food and fluid intake: Secondary | ICD-10-CM | POA: Diagnosis not present

## 2021-09-17 DIAGNOSIS — G893 Neoplasm related pain (acute) (chronic): Secondary | ICD-10-CM | POA: Diagnosis present

## 2021-09-17 DIAGNOSIS — R188 Other ascites: Secondary | ICD-10-CM | POA: Diagnosis not present

## 2021-09-17 DIAGNOSIS — A419 Sepsis, unspecified organism: Secondary | ICD-10-CM | POA: Diagnosis not present

## 2021-09-17 DIAGNOSIS — H6123 Impacted cerumen, bilateral: Secondary | ICD-10-CM | POA: Diagnosis present

## 2021-09-17 DIAGNOSIS — N179 Acute kidney failure, unspecified: Secondary | ICD-10-CM | POA: Diagnosis not present

## 2021-09-17 DIAGNOSIS — C251 Malignant neoplasm of body of pancreas: Secondary | ICD-10-CM | POA: Diagnosis present

## 2021-09-17 DIAGNOSIS — K9429 Other complications of gastrostomy: Secondary | ICD-10-CM | POA: Diagnosis not present

## 2021-09-17 DIAGNOSIS — Z434 Encounter for attention to other artificial openings of digestive tract: Secondary | ICD-10-CM | POA: Diagnosis not present

## 2021-09-17 DIAGNOSIS — I10 Essential (primary) hypertension: Secondary | ICD-10-CM | POA: Diagnosis not present

## 2021-09-17 DIAGNOSIS — K7689 Other specified diseases of liver: Secondary | ICD-10-CM | POA: Diagnosis not present

## 2021-09-17 DIAGNOSIS — K5900 Constipation, unspecified: Secondary | ICD-10-CM | POA: Diagnosis present

## 2021-09-17 DIAGNOSIS — C787 Secondary malignant neoplasm of liver and intrahepatic bile duct: Secondary | ICD-10-CM | POA: Diagnosis not present

## 2021-09-17 DIAGNOSIS — I959 Hypotension, unspecified: Secondary | ICD-10-CM | POA: Diagnosis present

## 2021-09-17 DIAGNOSIS — C7800 Secondary malignant neoplasm of unspecified lung: Secondary | ICD-10-CM | POA: Diagnosis not present

## 2021-09-17 DIAGNOSIS — E871 Hypo-osmolality and hyponatremia: Secondary | ICD-10-CM | POA: Diagnosis present

## 2021-09-17 DIAGNOSIS — C259 Malignant neoplasm of pancreas, unspecified: Secondary | ICD-10-CM | POA: Diagnosis not present

## 2021-09-17 DIAGNOSIS — E878 Other disorders of electrolyte and fluid balance, not elsewhere classified: Secondary | ICD-10-CM | POA: Diagnosis not present

## 2021-09-17 DIAGNOSIS — R1084 Generalized abdominal pain: Secondary | ICD-10-CM | POA: Diagnosis not present

## 2021-09-17 DIAGNOSIS — Z515 Encounter for palliative care: Secondary | ICD-10-CM | POA: Diagnosis not present

## 2021-09-18 DIAGNOSIS — C786 Secondary malignant neoplasm of retroperitoneum and peritoneum: Secondary | ICD-10-CM | POA: Diagnosis not present

## 2021-09-18 DIAGNOSIS — H6123 Impacted cerumen, bilateral: Secondary | ICD-10-CM | POA: Diagnosis present

## 2021-09-18 DIAGNOSIS — T85898A Other specified complication of other internal prosthetic devices, implants and grafts, initial encounter: Secondary | ICD-10-CM | POA: Diagnosis not present

## 2021-09-18 DIAGNOSIS — Z515 Encounter for palliative care: Secondary | ICD-10-CM | POA: Diagnosis not present

## 2021-09-18 DIAGNOSIS — N179 Acute kidney failure, unspecified: Secondary | ICD-10-CM | POA: Diagnosis present

## 2021-09-18 DIAGNOSIS — K5900 Constipation, unspecified: Secondary | ICD-10-CM | POA: Diagnosis present

## 2021-09-18 DIAGNOSIS — R110 Nausea: Secondary | ICD-10-CM | POA: Diagnosis not present

## 2021-09-18 DIAGNOSIS — K9423 Gastrostomy malfunction: Secondary | ICD-10-CM | POA: Diagnosis not present

## 2021-09-18 DIAGNOSIS — C259 Malignant neoplasm of pancreas, unspecified: Secondary | ICD-10-CM | POA: Diagnosis not present

## 2021-09-18 DIAGNOSIS — R638 Other symptoms and signs concerning food and fluid intake: Secondary | ICD-10-CM | POA: Diagnosis not present

## 2021-09-18 DIAGNOSIS — K219 Gastro-esophageal reflux disease without esophagitis: Secondary | ICD-10-CM | POA: Diagnosis present

## 2021-09-18 DIAGNOSIS — C787 Secondary malignant neoplasm of liver and intrahepatic bile duct: Secondary | ICD-10-CM | POA: Diagnosis not present

## 2021-09-18 DIAGNOSIS — R188 Other ascites: Secondary | ICD-10-CM | POA: Diagnosis not present

## 2021-09-18 DIAGNOSIS — Z66 Do not resuscitate: Secondary | ICD-10-CM | POA: Diagnosis not present

## 2021-09-18 DIAGNOSIS — Z681 Body mass index (BMI) 19 or less, adult: Secondary | ICD-10-CM | POA: Diagnosis not present

## 2021-09-18 DIAGNOSIS — C7800 Secondary malignant neoplasm of unspecified lung: Secondary | ICD-10-CM | POA: Diagnosis not present

## 2021-09-18 DIAGNOSIS — R636 Underweight: Secondary | ICD-10-CM | POA: Diagnosis present

## 2021-09-18 DIAGNOSIS — C772 Secondary and unspecified malignant neoplasm of intra-abdominal lymph nodes: Secondary | ICD-10-CM | POA: Diagnosis not present

## 2021-09-18 DIAGNOSIS — D72829 Elevated white blood cell count, unspecified: Secondary | ICD-10-CM | POA: Diagnosis not present

## 2021-09-18 DIAGNOSIS — K9429 Other complications of gastrostomy: Secondary | ICD-10-CM | POA: Diagnosis not present

## 2021-09-18 DIAGNOSIS — K56609 Unspecified intestinal obstruction, unspecified as to partial versus complete obstruction: Secondary | ICD-10-CM | POA: Diagnosis not present

## 2021-09-18 DIAGNOSIS — R531 Weakness: Secondary | ICD-10-CM | POA: Diagnosis not present

## 2021-09-18 DIAGNOSIS — Z931 Gastrostomy status: Secondary | ICD-10-CM | POA: Diagnosis not present

## 2021-09-18 DIAGNOSIS — Z7984 Long term (current) use of oral hypoglycemic drugs: Secondary | ICD-10-CM | POA: Diagnosis not present

## 2021-09-18 DIAGNOSIS — E872 Acidosis, unspecified: Secondary | ICD-10-CM | POA: Diagnosis present

## 2021-09-18 DIAGNOSIS — E1165 Type 2 diabetes mellitus with hyperglycemia: Secondary | ICD-10-CM | POA: Diagnosis present

## 2021-09-18 DIAGNOSIS — Z8639 Personal history of other endocrine, nutritional and metabolic disease: Secondary | ICD-10-CM | POA: Diagnosis not present

## 2021-09-18 DIAGNOSIS — G893 Neoplasm related pain (acute) (chronic): Secondary | ICD-10-CM | POA: Diagnosis present

## 2021-09-18 DIAGNOSIS — E119 Type 2 diabetes mellitus without complications: Secondary | ICD-10-CM | POA: Diagnosis not present

## 2021-09-18 DIAGNOSIS — I959 Hypotension, unspecified: Secondary | ICD-10-CM | POA: Diagnosis present

## 2021-09-18 DIAGNOSIS — E871 Hypo-osmolality and hyponatremia: Secondary | ICD-10-CM | POA: Diagnosis present

## 2021-09-18 DIAGNOSIS — E878 Other disorders of electrolyte and fluid balance, not elsewhere classified: Secondary | ICD-10-CM | POA: Diagnosis not present

## 2021-09-18 DIAGNOSIS — R1110 Vomiting, unspecified: Secondary | ICD-10-CM | POA: Diagnosis not present

## 2021-09-18 DIAGNOSIS — I10 Essential (primary) hypertension: Secondary | ICD-10-CM | POA: Diagnosis present

## 2021-09-18 DIAGNOSIS — E43 Unspecified severe protein-calorie malnutrition: Secondary | ICD-10-CM | POA: Diagnosis not present

## 2021-09-18 DIAGNOSIS — R112 Nausea with vomiting, unspecified: Secondary | ICD-10-CM | POA: Diagnosis not present

## 2021-09-18 DIAGNOSIS — K311 Adult hypertrophic pyloric stenosis: Secondary | ICD-10-CM | POA: Diagnosis present

## 2021-09-18 DIAGNOSIS — C251 Malignant neoplasm of body of pancreas: Secondary | ICD-10-CM | POA: Diagnosis present

## 2021-09-18 DIAGNOSIS — Z79891 Long term (current) use of opiate analgesic: Secondary | ICD-10-CM | POA: Diagnosis not present

## 2021-09-18 DIAGNOSIS — Z434 Encounter for attention to other artificial openings of digestive tract: Secondary | ICD-10-CM | POA: Diagnosis not present

## 2021-09-20 NOTE — Progress Notes (Deleted)
Patient Care Team: Street, [name], MD as PCP - General (Family Medicine) Daryel November, MD as Consulting Physician (Gastroenterology) Melodye Ped, NP as Nurse Practitioner (Hematology and Oncology) Dwan Bolt, MD as Consulting Physician (General Surgery) Derwood Kaplan, MD as Consulting Physician (Oncology)  Clinic Day:  09/20/2021  Referring physician: Street, [name], *  ASSESSMENT & PLAN:   Assessment & Plan: No problem-specific Assessment & Plan notes found for this encounter.     The patient understands the plans discussed today and is in agreement with them.  She knows to contact our office if she develops concerns prior to her next appointment.    Derwood Kaplan, MD  [facility] [address] [place] [address] Dept: [phone] Dept Fax: [phone]   No orders of the defined types were placed in this encounter.     CHIEF COMPLAINT:  CC: A 76 year old female with history of pancreatic cancer  Current Treatment:  Gemcitabine  INTERVAL HISTORY:  Leah Pruitt is here today for repeat clinical assessment. She denies fevers or chills. She denies pain. Her appetite is good. Her weight has decreased 2  pounds over last week .  I have reviewed the past medical history, past surgical history, social history and family history with the patient and they are unchanged from previous note.  ALLERGIES:  is allergic to iodinated contrast media, naratriptan, penicillins, iodine, ciprofloxacin, duragesic-100 [fentanyl], and penicillin g.  MEDICATIONS:  Current Outpatient Medications  Medication Sig Dispense Refill   ACCU-CHEK GUIDE test strip 2 (two) times daily.     Acetaminophen 500 MG capsule Take by mouth.     bisacodyl (DULCOLAX) 10 MG suppository Place rectally.     Calcium Carbonate-Vitamin D 600-200 MG-UNIT TABS Take 1 tablet by mouth 2 (two) times daily with a  meal.     CAMPHOR EX Apply 1 application topically as needed (pain).     Carboxymethylcellulose Sodium (DRY EYE RELIEF OP) Apply 1 drop to eye as needed (Dryness).     Cholecalciferol 25 MCG (1000 UT) tablet Take by mouth.     dicyclomine (BENTYL) 20 MG tablet TAKE 1/2 TO 1 TABLET BY MOUTH UP TO FOUR TIMES DAILY AS NEEDED FOR ABDOMINAL CRAMPS     diltiazem (TIAZAC) 180 MG 24 hr capsule Take by mouth.     Docusate Sodium (DSS) 100 MG CAPS Take 1 capsule by mouth daily.     FEROSUL 325 (65 Fe) MG tablet Take 325 mg by mouth daily.     FLUoxetine (PROZAC) 20 MG capsule Take 1 capsule (20 mg total) by mouth daily. 30 capsule 3   gabapentin (NEURONTIN) 300 MG capsule Neurontin 300 mg capsule (Patient not taking: Reported on 06/13/2021)     GLUMETZA 500 MG 24 hr tablet Take by mouth.     HYDROmorphone (DILAUDID) 4 MG tablet Take 1 tablet (4 mg total) by mouth every 4 (four) hours as needed for severe pain. 120 tablet 0   Hyoscyamine Sulfate SL 0.125 MG SUBL DISSOLVE 1 TO 2 TABLETS UNDER THE TONGUE EVERY 6 TO 8 HOURS AS NEEDED FOR PAIN     ibuprofen (ADVIL) 800 MG tablet Take 800 mg by mouth 3 (three) times daily.     lactulose (CONSTULOSE) 10 GM/15ML solution Take 15 mLs (10 g total) by mouth 3 (three) times daily. 237 mL 0   lidocaine-prilocaine (EMLA) cream Apply to affected area once 30 g 3  LORazepam (ATIVAN) 1 MG tablet Take 1 tablet (1 mg total) by mouth every 8 (eight) hours. 30 tablet 0   magnesium hydroxide (MILK OF MAGNESIA) 800 MG/5ML suspension Take 30 mLs by mouth daily.     meclizine (ANTIVERT) 25 MG tablet Take 25 mg by mouth daily as needed for dizziness.     metFORMIN (GLUCOPHAGE) 500 MG tablet Take by mouth.     metroNIDAZOLE (FLAGYL) 500 MG tablet Take 500 mg by mouth 3 (three) times daily.     MOVANTIK 12.5 MG TABS tablet Take 12.5 mg by mouth daily as needed (constipation).     nitroGLYCERIN (NITROSTAT) 0.4 MG SL tablet Place 0.4 mg under the tongue every 5 (five) minutes as  needed for chest pain.     olmesartan (BENICAR) 20 MG tablet Take 20 mg by mouth 2 (two) times daily.     ondansetron (ZOFRAN) 4 MG tablet Take 1 tablet (4 mg total) by mouth every 4 (four) hours as needed for nausea. 90 tablet 3   oxyCODONE (OXY IR/ROXICODONE) 5 MG immediate release tablet Take 5-10 mg by mouth every 8 (eight) hours as needed.     prochlorperazine (COMPAZINE) 10 MG tablet Take 1 tablet (10 mg total) by mouth every 6 (six) hours as needed (Nausea or vomiting). 30 tablet 1   RABEprazole (ACIPHEX) 20 MG tablet Take 20 mg by mouth 2 (two) times daily.     STIMULANT LAXATIVE 8.6-50 MG tablet Take 2 tablets by mouth 2 (two) times daily.     sucralfate (CARAFATE) 1 GM/10ML suspension Take 1 g by mouth 2 (two) times daily.     tiZANidine (ZANAFLEX) 4 MG capsule Take by mouth.     traMADol (ULTRAM) 50 MG tablet Take 1 tablet (50 mg total) by mouth every 4 (four) hours as needed. 90 tablet 0   No current facility-administered medications for this visit.   Facility-Administered Medications Ordered in Other Visits  Medication Dose Route Frequency Provider Last Rate Last Admin   sodium chloride flush (NS) 0.9 % injection 10 mL  10 mL Intracatheter PRN [place] Scrape A, NP   10 mL at 06/30/21 1340    HISTORY OF PRESENT ILLNESS:   Oncology History  Pancreatic cancer ([place])  04/08/2021 Cancer Staging   Staging form: Exocrine Pancreas, AJCC 8th Edition - Clinical stage from 04/08/2021: Stage IB (cT2, cN0, cM0) - Signed by Derwood Kaplan, MD on 06/10/2021 Histopathologic type: Mucinous adenocarcinoma Stage prefix: Initial diagnosis Total positive nodes: 0 Histologic grade (G): GX Histologic grading system: 3 grade system Tumor size (mm): 35 Diagnostic confirmation: Positive histology PLUS positive immunophenotyping and/or positive genetic studies Specimen type: Endoscopy with Biopsy Staged by: Managing physician Stage used in treatment planning: Yes National guidelines used in  treatment planning: Yes Type of national guideline used in treatment planning: NCCN   04/28/2021 Initial Diagnosis   Pancreatic cancer ([place])   06/27/2021 -  Chemotherapy   Patient is on Treatment Plan : PANCREAS Gemcitabine D1 & D15 ONLY q28d x 4 Cycles (day 8 deleted 07/13/21)     08/26/2021 Genetic Testing   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  The report date is August 26, 2021.   The [employer]-Cancer+RNAinsight panel offered by [facility] includes sequencing and rearrangement analysis for the following 82 genes:  AIP, ALK, APC, ATM, BAP1, BARD1, BLM, BMPR1A, BRCA1, BRCA2, BRIP1, CDC73, CDH1, CDK4, CDKN1B, CDKN2A, CHEK2,DICER1, FANCC, FH, FLCN, GALNT12, KIF1B, LZTR1, MAX, MEN1, MET, MLH1, MSH2, MSH6, MUTYH, NBN, NF1,  NF2, NTHL1, PALB2,PHOX2B, PMS2, POT1, PRKAR1A, PTCH1, PTEN, RAD51C, RAD51D, RB1, RECQL, RET, SDHA, SDHAF2, SDHB, SDHC, SDHD, SMAD4, SMARCA4, SMARCB1, SMARCE1, STK11, SUFU, TMEM127, TP53, TSC1, TSC2, VHL and XRCC2 (sequencing and deletion/duplication); AXIN2, CASR, CPA1, CTNNA1, CTRC, EGFR, EGLN1, HOXB13, KIT, MITF, MSH3, PDGFRA, POLD1, POLE, PRSS1 and SPINK1 (sequencing only); EPCAM and GREM1 (deletion/duplication only). RNA data is routinely analyzed for use in variant interpretation for all genes.       REVIEW OF SYSTEMS:   Constitutional: Denies fevers, chills or abnormal weight loss Eyes: Denies blurriness of vision Ears, nose, mouth, throat, and face: Denies mucositis or sore throat Respiratory: Denies cough, dyspnea or wheezes Cardiovascular: Denies palpitation, chest discomfort or lower extremity swelling Gastrointestinal:  Denies nausea, heartburn or change in bowel habits Skin: Denies abnormal skin rashes Lymphatics: Denies new lymphadenopathy or easy bruising Neurological:Denies numbness, tingling or new weaknesses Behavioral/Psych: Mood is stable, no new changes  All other systems were reviewed with the patient and are  negative.   VITALS:  There were no vitals taken for this visit.  Wt Readings from Last 3 Encounters:  08/31/21 99 lb 12.8 oz (45.3 kg)  08/23/21 100 lb 14.4 oz (45.8 kg)  08/17/21 102 lb (46.3 kg)    There is no height or weight on file to calculate BMI.  Performance status (ECOG): 1 - Symptomatic but completely ambulatory  PHYSICAL EXAM:   GENERAL:alert, no distress and comfortable SKIN: skin color, texture, turgor are normal, no rashes or significant lesions EYES: normal, Conjunctiva are pink and non-injected, sclera clear OROPHARYNX:no exudate, no erythema and lips, buccal mucosa, and tongue normal  NECK: supple, thyroid normal size, non-tender, without nodularity LYMPH:  no palpable lymphadenopathy in the cervical, axillary or inguinal LUNGS: clear to auscultation and percussion with normal breathing effort HEART: regular rate & rhythm and no murmurs and no lower extremity edema ABDOMEN:abdomen soft, non-tender and normal bowel sounds Musculoskeletal:no cyanosis of digits and no clubbing  NEURO: alert & oriented x 3 with fluent speech, no focal motor/sensory deficits  LABORATORY DATA:  I have reviewed the data as listed    Component Value Date/Time   NA 136 (A) 08/23/2021 0000   K 4.9 08/23/2021 0000   CL 100 08/23/2021 0000   CO2 29 (A) 08/23/2021 0000   GLUCOSE 127 (H) 05/06/2021 0649   BUN 11 08/23/2021 0000   CREATININE 0.8 08/23/2021 0000   CREATININE 0.88 05/06/2021 0649   CALCIUM 9.1 08/23/2021 0000   PROT 6 (A) 07/27/2021 0000   ALBUMIN 4.1 08/23/2021 0000   AST 30 08/23/2021 0000   ALT 18 08/23/2021 0000   ALKPHOS 110 08/23/2021 0000   BILITOT 1.2 05/05/2021 0951   GFRNONAA >60 05/06/2021 0649   GFRAA >60 10/11/2016 1100    No results found for: "SPEP", "UPEP"  Lab Results  Component Value Date   WBC 8.6 08/23/2021   NEUTROABS 4.73 08/23/2021   HGB 14.5 08/23/2021   HCT 44 08/23/2021   MCV 86.7 05/06/2021   PLT 205 08/23/2021      Chemistry       Component Value Date/Time   NA 136 (A) 08/23/2021 0000   K 4.9 08/23/2021 0000   CL 100 08/23/2021 0000   CO2 29 (A) 08/23/2021 0000   BUN 11 08/23/2021 0000   CREATININE 0.8 08/23/2021 0000   CREATININE 0.88 05/06/2021 0649   GLU 102 08/23/2021 0000      Component Value Date/Time   CALCIUM 9.1 08/23/2021 0000   ALKPHOS 110 08/23/2021  0000   AST 30 08/23/2021 0000   ALT 18 08/23/2021 0000   BILITOT 1.2 05/05/2021 0951       RADIOGRAPHIC STUDIES: I have personally reviewed the radiological images as listed and agreed with the findings in the report. No results found.

## 2021-09-22 ENCOUNTER — Ambulatory Visit: Payer: Medicare Other | Admitting: Oncology

## 2021-09-22 ENCOUNTER — Other Ambulatory Visit: Payer: Medicare Other

## 2021-09-22 DIAGNOSIS — C252 Malignant neoplasm of tail of pancreas: Secondary | ICD-10-CM

## 2021-09-22 DIAGNOSIS — C259 Malignant neoplasm of pancreas, unspecified: Secondary | ICD-10-CM

## 2021-09-27 ENCOUNTER — Other Ambulatory Visit: Payer: Self-pay | Admitting: Oncology

## 2021-09-29 ENCOUNTER — Other Ambulatory Visit: Payer: Self-pay | Admitting: Oncology

## 2021-09-29 NOTE — Progress Notes (Signed)
 ------------------------------------------------------------------------------- Attestation signed by Chevli, Parag Anilkumar, MD at 09/29/2021  4:48 PM I saw and evaluated the patient and reviewed the resident's note. I agree with the resident's findings and plan.   -Patient's husband is agreeable with hospice care at home -He would like to talk to case manager regarding the options -He is agreeable with patient not getting IV fluid at home -------------------------------------------------------------------------------     Hospitalist Daily Progress Note   09/29/2021       Room: 701/701-01 Hospital Day: 11 Attending  Physician: Layla Mac Parisian, *  Subjective / Interval History / ROS  NAEON. Pt's husband at bedside reports some vomiting overnight.  Pt this morning complaining of nausea. Husband was at bedside draining GJ tube. Pt  Required an increase in pain meds overnight and has endorsed pain controll at current level with PCA. Denies CP, SOB, fever and chills. Patient has decided on doing hospice care at home.     Assessment and Plan  Leah Pruitt is a 75 y.o. female with past medical history significant for pancreatic adenocarcinoma and diabetes mellitus presenting with nausea, vomiting, abdominal pain, and chronic bowel obstruction.  GJ tube clogged and has been causing abdominal pressure and leakage through insertion point on the abdomen. S/p irrigation by Gen Surg with recommendations to f/u IR to replace tube with a G-tube of larger lumen and ports to facilitate drainage and minimize port obstruction/clogging.  Pt seeks  transition to hospice care at home.   Pancreatic Adenocarcinoma, POA active Diagnosed on 03/16/21. Recently withdrew from palliative chemotherapy a few weeks ago. Possible malpositioning of GJ tube due to tumor mass effect.  Plan: -Palliative consulted; appreciate recs - Continue dilaudid  PCA pump for pain control per palliative recs -Home  hospice  Chronic Obstruction of GJ tube, POA (resolved) Nausea, POA improved Most likely 2/2 mass effect of tumor. On CT stomach and jejunum are dilated with air-fluid levels with transition point where jejunum abuts the pancreatic mass. There is leakage at the point of entry of GJ tube into the abdominal cavity. GI unclogged sludge from the tube on 6/29 and was able to drain fluid. 7/2: GI unable to clear tube at bedside, recommending exchange.  7/3:  Gen surgery aspirated tube and it is draining into a foley bag (09/26/21) but reclogged 7/5: re-evaluated by Gen Surg with recommendation for IR to replace with a G-tube for venting.  Plan:  -Zofran  and compazine  PRN nausea - protonix  BID - LR 30 ml/hr   Constipation, POA improved Patient complaining of minimal bowel movement causing her to feel bloated and uncomfortable. PO meds less effective as patient has small bowel obstruction  Plan: -bisacodyl  suppository PRN - senna docusate nightly  #Prophylaxis: ICU:wnwz.   #FEN: pureed diet #Ethics: DNR Comfort care. #Discharge Planning:  dispo pending home hospice care with IV fluids and PCA pump for pain control. See Case Manager notes. In summary, no hospice care is will to manage any IVF's.   The primary contact is: Extended Emergency Contact Information Primary Emergency Contact: Leah Pruitt Address: [address]          [place], [place] [address] [place] Home Phone: [phone] Mobile Phone: [phone] Relation: Husband  Code Status: DNR / Comfort Care SOTO            Objective   Temp:  [97.7 F (36.5 C)-98.4 F (36.9 C)] 98.4 F (36.9 C) Pulse:  [97-135] 111 Resp:  [16-18] 16 BP: (104-117)/(72-86) 117/80 MAP (mmHg):  [  82-93] 93 SpO2:  [96 %-98 %] 97 % Body mass index is 15.52 kg/m.  Intake/Output Summary (Last 24 hours) at 09/29/2021 1051 Last data filed at 09/29/2021 0906 Gross per 24 hour  Intake 8610.75 ml  Output 550 ml   Net 8060.75 ml     Physical Exam CONSTITUTIONAL: alert and oriented x3, no acute distress EYES: no scleral icterus, EOMI, PERRL NECK: No JVD, ROM normal, no lymphadenopathy noted CARDIOVASCULAR: normal rate, regular rhythm; no murmurs appreciated, 2+ radial pulses, warm extremities RESPIRATORY: comfortable on room air, normal respiratory effort, no crackles appreciated ABDOMEN: soft, nontender, G tube entry site with surrounding erythema and visible white drainage Skin: port site appears clean without erythema or drainage EXT: bilateral lower extremity edema (non-pitting)   Current Diet:  Active Orders  Diet   Pureed       Patient Lines/Drains/Airways Status    Active Lines    Name Placement date Placement time Site Days   Port A Cath Single Lumen 09/17/21 Chest 09/17/21  1656  Chest  11           Electronically signed by: Dock Jaycee Balo, MD 09/29/2021 10:51 AM    Electronically signed by: Dock Jaycee Balo, MD Resident 09/28/21 1248    Electronically signed by: Layla Mac Parisian, MD 09/28/21 1652   Electronically signed by: Dock Jaycee Balo, MD Resident 09/29/21 1102    Electronically signed by: Layla Mac Parisian, MD 09/29/21 [phone]

## 2021-10-04 ENCOUNTER — Encounter: Payer: Self-pay | Admitting: Genetic Counselor

## 2021-10-28 ENCOUNTER — Encounter: Payer: Medicare Other | Admitting: Genetic Counselor

## 2022-09-11 ENCOUNTER — Encounter: Payer: Self-pay | Admitting: Hematology and Oncology

## 2023-05-16 ENCOUNTER — Encounter: Payer: Self-pay | Admitting: Genetic Counselor
